# Patient Record
Sex: Female | Born: 1955 | Race: White | Hispanic: No | Smoking: Current every day smoker
Health system: Southern US, Community
[De-identification: ages and names within clinical notes are randomized; demographics above are authoritative.]

## PROBLEM LIST (undated history)

## (undated) DIAGNOSIS — F17200 Nicotine dependence, unspecified, uncomplicated: Secondary | ICD-10-CM

## (undated) DIAGNOSIS — M545 Low back pain, unspecified: Secondary | ICD-10-CM

## (undated) DIAGNOSIS — S72009A Fracture of unspecified part of neck of unspecified femur, initial encounter for closed fracture: Secondary | ICD-10-CM

## (undated) DIAGNOSIS — R259 Unspecified abnormal involuntary movements: Secondary | ICD-10-CM

## (undated) DIAGNOSIS — F329 Major depressive disorder, single episode, unspecified: Secondary | ICD-10-CM

## (undated) DIAGNOSIS — E860 Dehydration: Secondary | ICD-10-CM

## (undated) DIAGNOSIS — K635 Polyp of colon: Secondary | ICD-10-CM

## (undated) DIAGNOSIS — M25529 Pain in unspecified elbow: Secondary | ICD-10-CM

## (undated) DIAGNOSIS — N390 Urinary tract infection, site not specified: Secondary | ICD-10-CM

## (undated) DIAGNOSIS — R413 Other amnesia: Secondary | ICD-10-CM

## (undated) DIAGNOSIS — E039 Hypothyroidism, unspecified: Secondary | ICD-10-CM

## (undated) DIAGNOSIS — Z8542 Personal history of malignant neoplasm of other parts of uterus: Secondary | ICD-10-CM

## (undated) DIAGNOSIS — I313 Pericardial effusion (noninflammatory): Secondary | ICD-10-CM

## (undated) DIAGNOSIS — J449 Chronic obstructive pulmonary disease, unspecified: Secondary | ICD-10-CM

## (undated) DIAGNOSIS — M7060 Trochanteric bursitis, unspecified hip: Secondary | ICD-10-CM

## (undated) DIAGNOSIS — M5430 Sciatica, unspecified side: Secondary | ICD-10-CM

## (undated) DIAGNOSIS — R1084 Generalized abdominal pain: Secondary | ICD-10-CM

## (undated) DIAGNOSIS — E785 Hyperlipidemia, unspecified: Secondary | ICD-10-CM

## (undated) DIAGNOSIS — R197 Diarrhea, unspecified: Secondary | ICD-10-CM

## (undated) DIAGNOSIS — R112 Nausea with vomiting, unspecified: Secondary | ICD-10-CM

## (undated) DIAGNOSIS — E119 Type 2 diabetes mellitus without complications: Secondary | ICD-10-CM

## (undated) DIAGNOSIS — G9341 Metabolic encephalopathy: Secondary | ICD-10-CM

## (undated) DIAGNOSIS — B029 Zoster without complications: Secondary | ICD-10-CM

## (undated) DIAGNOSIS — I314 Cardiac tamponade: Secondary | ICD-10-CM

## (undated) DIAGNOSIS — M25559 Pain in unspecified hip: Secondary | ICD-10-CM

## (undated) DIAGNOSIS — F32A Depression, unspecified: Secondary | ICD-10-CM

## (undated) DIAGNOSIS — M7072 Other bursitis of hip, left hip: Secondary | ICD-10-CM

## (undated) DIAGNOSIS — E559 Vitamin D deficiency, unspecified: Secondary | ICD-10-CM

## (undated) DIAGNOSIS — M533 Sacrococcygeal disorders, not elsewhere classified: Secondary | ICD-10-CM

## (undated) DIAGNOSIS — G4730 Sleep apnea, unspecified: Secondary | ICD-10-CM

## (undated) DIAGNOSIS — K219 Gastro-esophageal reflux disease without esophagitis: Secondary | ICD-10-CM

## (undated) DIAGNOSIS — IMO0002 Reserved for concepts with insufficient information to code with codable children: Secondary | ICD-10-CM

## (undated) DIAGNOSIS — Z8541 Personal history of malignant neoplasm of cervix uteri: Secondary | ICD-10-CM

## (undated) DIAGNOSIS — K529 Noninfective gastroenteritis and colitis, unspecified: Secondary | ICD-10-CM

## (undated) DIAGNOSIS — M542 Cervicalgia: Secondary | ICD-10-CM

## (undated) DIAGNOSIS — S0300XA Dislocation of jaw, unspecified side, initial encounter: Secondary | ICD-10-CM

## (undated) DIAGNOSIS — G8929 Other chronic pain: Secondary | ICD-10-CM

## (undated) DIAGNOSIS — K2270 Barrett's esophagus without dysplasia: Secondary | ICD-10-CM

## (undated) HISTORY — DX: Zoster without complications: B02.9

## (undated) HISTORY — PX: HIP SURGERY: SHX245

## (undated) HISTORY — DX: Other chronic pain: G89.29

## (undated) HISTORY — DX: Personal history of malignant neoplasm of cervix uteri: Z85.41

## (undated) HISTORY — DX: Depression, unspecified: F32.A

## (undated) HISTORY — DX: Urinary tract infection, site not specified: N39.0

## (undated) HISTORY — DX: Major depressive disorder, single episode, unspecified: F32.9

## (undated) HISTORY — DX: Gastro-esophageal reflux disease without esophagitis: K21.9

## (undated) HISTORY — DX: Hyperlipidemia, unspecified: E78.5

## (undated) HISTORY — DX: Trochanteric bursitis, unspecified hip: M70.60

## (undated) HISTORY — DX: Fracture of unspecified part of neck of unspecified femur, initial encounter for closed fracture: S72.009A

## (undated) HISTORY — DX: Pain in unspecified elbow: M25.529

## (undated) HISTORY — PX: DIAGNOSTIC LAPAROSCOPY: SUR761

## (undated) HISTORY — DX: Nicotine dependence, unspecified, uncomplicated: F17.200

## (undated) HISTORY — DX: Low back pain, unspecified: M54.50

## (undated) HISTORY — PX: ABDOMINAL HYSTERECTOMY: SHX81

## (undated) HISTORY — DX: Metabolic encephalopathy: G93.41

## (undated) HISTORY — DX: Low back pain: M54.5

## (undated) HISTORY — DX: Sleep apnea, unspecified: G47.30

## (undated) HISTORY — DX: Dehydration: E86.0

## (undated) HISTORY — DX: Other bursitis of hip, left hip: M70.72

## (undated) HISTORY — DX: Chronic obstructive pulmonary disease, unspecified: J44.9

## (undated) HISTORY — DX: Sciatica, unspecified side: M54.30

## (undated) HISTORY — DX: Type 2 diabetes mellitus without complications: E11.9

## (undated) HISTORY — DX: Vitamin D deficiency, unspecified: E55.9

## (undated) HISTORY — DX: Hypothyroidism, unspecified: E03.9

## (undated) HISTORY — DX: Nausea with vomiting, unspecified: R11.2

## (undated) HISTORY — DX: Reserved for concepts with insufficient information to code with codable children: IMO0002

## (undated) HISTORY — DX: Cardiac tamponade: I31.4

## (undated) HISTORY — DX: Cervicalgia: M54.2

## (undated) HISTORY — DX: Pain in unspecified hip: M25.559

## (undated) HISTORY — DX: Unspecified abnormal involuntary movements: R25.9

## (undated) HISTORY — DX: Pericardial effusion (noninflammatory): I31.3

## (undated) HISTORY — DX: Diarrhea, unspecified: R19.7

## (undated) HISTORY — DX: Generalized abdominal pain: R10.84

## (undated) HISTORY — DX: Noninfective gastroenteritis and colitis, unspecified: K52.9

## (undated) HISTORY — DX: Personal history of malignant neoplasm of other parts of uterus: Z85.42

## (undated) HISTORY — DX: Sacrococcygeal disorders, not elsewhere classified: M53.3

## (undated) HISTORY — DX: Polyp of colon: K63.5

## (undated) HISTORY — DX: Barrett's esophagus without dysplasia: K22.70

---

## 1993-09-08 HISTORY — PX: BACK SURGERY: SHX140

## 1996-09-08 DIAGNOSIS — Z8542 Personal history of malignant neoplasm of other parts of uterus: Secondary | ICD-10-CM

## 1996-09-08 HISTORY — DX: Personal history of malignant neoplasm of other parts of uterus: Z85.42

## 1997-09-08 HISTORY — PX: OTHER SURGICAL HISTORY: SHX169

## 2004-09-08 HISTORY — PX: ROTATOR CUFF REPAIR: SHX139

## 2005-09-08 HISTORY — PX: THYROIDECTOMY: SHX17

## 2007-09-24 ENCOUNTER — Ambulatory Visit (HOSPITAL_COMMUNITY): Admission: RE | Admit: 2007-09-24 | Discharge: 2007-09-24 | Payer: Self-pay | Admitting: Orthopedic Surgery

## 2008-04-02 ENCOUNTER — Emergency Department (HOSPITAL_COMMUNITY): Admission: EM | Admit: 2008-04-02 | Discharge: 2008-04-02 | Payer: Self-pay | Admitting: Emergency Medicine

## 2008-04-04 ENCOUNTER — Ambulatory Visit (HOSPITAL_COMMUNITY): Admission: RE | Admit: 2008-04-04 | Discharge: 2008-04-04 | Payer: Self-pay | Admitting: Emergency Medicine

## 2008-04-06 ENCOUNTER — Inpatient Hospital Stay (HOSPITAL_COMMUNITY): Admission: AD | Admit: 2008-04-06 | Discharge: 2008-04-08 | Payer: Self-pay | Admitting: Orthopedic Surgery

## 2008-04-13 ENCOUNTER — Encounter
Admission: RE | Admit: 2008-04-13 | Discharge: 2008-05-12 | Payer: Self-pay | Admitting: Physical Medicine and Rehabilitation

## 2008-04-17 ENCOUNTER — Ambulatory Visit: Payer: Self-pay | Admitting: Physical Medicine and Rehabilitation

## 2008-04-19 ENCOUNTER — Ambulatory Visit (HOSPITAL_COMMUNITY): Admission: RE | Admit: 2008-04-19 | Discharge: 2008-04-19 | Payer: Self-pay | Admitting: Family Medicine

## 2008-05-12 ENCOUNTER — Ambulatory Visit: Payer: Self-pay | Admitting: Physical Medicine and Rehabilitation

## 2008-05-18 ENCOUNTER — Ambulatory Visit (HOSPITAL_COMMUNITY)
Admission: RE | Admit: 2008-05-18 | Discharge: 2008-05-18 | Payer: Self-pay | Admitting: Physical Medicine and Rehabilitation

## 2008-06-08 ENCOUNTER — Encounter
Admission: RE | Admit: 2008-06-08 | Discharge: 2008-09-06 | Payer: Self-pay | Admitting: Physical Medicine and Rehabilitation

## 2008-06-30 ENCOUNTER — Ambulatory Visit: Admission: RE | Admit: 2008-06-30 | Discharge: 2008-06-30 | Payer: Self-pay | Admitting: Family Medicine

## 2008-07-07 ENCOUNTER — Ambulatory Visit: Payer: Self-pay | Admitting: Physical Medicine and Rehabilitation

## 2008-07-14 ENCOUNTER — Ambulatory Visit (HOSPITAL_COMMUNITY)
Admission: RE | Admit: 2008-07-14 | Discharge: 2008-07-14 | Payer: Self-pay | Admitting: Physical Medicine and Rehabilitation

## 2008-09-08 HISTORY — PX: OTHER SURGICAL HISTORY: SHX169

## 2008-09-14 ENCOUNTER — Encounter
Admission: RE | Admit: 2008-09-14 | Discharge: 2008-12-13 | Payer: Self-pay | Admitting: Physical Medicine and Rehabilitation

## 2008-09-15 ENCOUNTER — Ambulatory Visit: Payer: Self-pay | Admitting: Physical Medicine and Rehabilitation

## 2008-09-29 ENCOUNTER — Encounter (HOSPITAL_COMMUNITY)
Admission: RE | Admit: 2008-09-29 | Discharge: 2008-10-29 | Payer: Self-pay | Admitting: Physical Medicine and Rehabilitation

## 2008-09-29 ENCOUNTER — Ambulatory Visit
Admission: RE | Admit: 2008-09-29 | Discharge: 2008-09-29 | Payer: Self-pay | Admitting: Physical Medicine and Rehabilitation

## 2008-11-01 ENCOUNTER — Ambulatory Visit: Payer: Self-pay | Admitting: Physical Medicine and Rehabilitation

## 2008-12-07 ENCOUNTER — Encounter
Admission: RE | Admit: 2008-12-07 | Discharge: 2008-12-07 | Payer: Self-pay | Admitting: Physical Medicine & Rehabilitation

## 2008-12-13 ENCOUNTER — Encounter
Admission: RE | Admit: 2008-12-13 | Discharge: 2009-03-13 | Payer: Self-pay | Admitting: Physical Medicine and Rehabilitation

## 2008-12-13 ENCOUNTER — Ambulatory Visit: Payer: Self-pay | Admitting: Physical Medicine and Rehabilitation

## 2009-01-11 ENCOUNTER — Ambulatory Visit: Payer: Self-pay | Admitting: Physical Medicine and Rehabilitation

## 2009-01-18 ENCOUNTER — Ambulatory Visit: Payer: Self-pay | Admitting: Physical Medicine and Rehabilitation

## 2009-01-29 ENCOUNTER — Ambulatory Visit (HOSPITAL_COMMUNITY)
Admission: RE | Admit: 2009-01-29 | Discharge: 2009-01-29 | Payer: Self-pay | Admitting: Physical Medicine and Rehabilitation

## 2009-01-31 ENCOUNTER — Ambulatory Visit: Payer: Self-pay | Admitting: Physical Medicine and Rehabilitation

## 2009-02-06 ENCOUNTER — Ambulatory Visit (HOSPITAL_COMMUNITY)
Admission: RE | Admit: 2009-02-06 | Discharge: 2009-02-06 | Payer: Self-pay | Admitting: Physical Medicine and Rehabilitation

## 2009-02-16 ENCOUNTER — Ambulatory Visit (HOSPITAL_COMMUNITY)
Admission: RE | Admit: 2009-02-16 | Discharge: 2009-02-16 | Payer: Self-pay | Admitting: Physical Medicine and Rehabilitation

## 2009-02-23 ENCOUNTER — Ambulatory Visit: Payer: Self-pay | Admitting: Physical Medicine and Rehabilitation

## 2009-03-20 ENCOUNTER — Encounter
Admission: RE | Admit: 2009-03-20 | Discharge: 2009-06-18 | Payer: Self-pay | Admitting: Physical Medicine and Rehabilitation

## 2009-03-21 ENCOUNTER — Ambulatory Visit: Payer: Self-pay | Admitting: Physical Medicine and Rehabilitation

## 2009-04-18 ENCOUNTER — Ambulatory Visit: Payer: Self-pay | Admitting: Physical Medicine and Rehabilitation

## 2009-05-18 ENCOUNTER — Ambulatory Visit: Payer: Self-pay | Admitting: Physical Medicine and Rehabilitation

## 2009-06-14 ENCOUNTER — Encounter: Admission: RE | Admit: 2009-06-14 | Discharge: 2009-06-14 | Payer: Self-pay | Admitting: Neurological Surgery

## 2009-06-19 ENCOUNTER — Encounter
Admission: RE | Admit: 2009-06-19 | Discharge: 2009-08-30 | Payer: Self-pay | Admitting: Physical Medicine and Rehabilitation

## 2009-06-20 ENCOUNTER — Ambulatory Visit: Payer: Self-pay | Admitting: Physical Medicine and Rehabilitation

## 2009-06-26 ENCOUNTER — Ambulatory Visit (HOSPITAL_COMMUNITY): Admission: RE | Admit: 2009-06-26 | Discharge: 2009-06-26 | Payer: Self-pay | Admitting: Family Medicine

## 2009-07-18 ENCOUNTER — Ambulatory Visit: Payer: Self-pay | Admitting: Physical Medicine and Rehabilitation

## 2009-08-14 ENCOUNTER — Ambulatory Visit (HOSPITAL_BASED_OUTPATIENT_CLINIC_OR_DEPARTMENT_OTHER): Admission: RE | Admit: 2009-08-14 | Discharge: 2009-08-14 | Payer: Self-pay | Admitting: Orthopedic Surgery

## 2009-08-15 ENCOUNTER — Ambulatory Visit: Payer: Self-pay | Admitting: Physical Medicine and Rehabilitation

## 2009-09-17 ENCOUNTER — Ambulatory Visit: Payer: Self-pay | Admitting: Physical Medicine and Rehabilitation

## 2009-09-17 ENCOUNTER — Encounter
Admission: RE | Admit: 2009-09-17 | Discharge: 2009-12-16 | Payer: Self-pay | Admitting: Physical Medicine and Rehabilitation

## 2009-10-15 ENCOUNTER — Ambulatory Visit: Payer: Self-pay | Admitting: Physical Medicine and Rehabilitation

## 2009-11-09 ENCOUNTER — Ambulatory Visit: Payer: Self-pay | Admitting: Physical Medicine and Rehabilitation

## 2009-11-27 ENCOUNTER — Ambulatory Visit: Payer: Self-pay | Admitting: Internal Medicine

## 2009-11-27 DIAGNOSIS — K5909 Other constipation: Secondary | ICD-10-CM | POA: Insufficient documentation

## 2009-11-27 DIAGNOSIS — K219 Gastro-esophageal reflux disease without esophagitis: Secondary | ICD-10-CM | POA: Insufficient documentation

## 2009-11-27 DIAGNOSIS — Z91013 Allergy to seafood: Secondary | ICD-10-CM | POA: Insufficient documentation

## 2009-11-27 DIAGNOSIS — Z91040 Latex allergy status: Secondary | ICD-10-CM | POA: Insufficient documentation

## 2009-11-27 DIAGNOSIS — K2270 Barrett's esophagus without dysplasia: Secondary | ICD-10-CM | POA: Insufficient documentation

## 2009-11-27 DIAGNOSIS — Z86010 Personal history of colonic polyps: Secondary | ICD-10-CM | POA: Insufficient documentation

## 2009-12-12 ENCOUNTER — Ambulatory Visit: Payer: Self-pay | Admitting: Physical Medicine and Rehabilitation

## 2009-12-18 ENCOUNTER — Ambulatory Visit (HOSPITAL_COMMUNITY): Admission: RE | Admit: 2009-12-18 | Discharge: 2009-12-18 | Payer: Self-pay | Admitting: Internal Medicine

## 2009-12-18 ENCOUNTER — Ambulatory Visit: Payer: Self-pay | Admitting: Internal Medicine

## 2009-12-18 ENCOUNTER — Telehealth (INDEPENDENT_AMBULATORY_CARE_PROVIDER_SITE_OTHER): Payer: Self-pay | Admitting: *Deleted

## 2009-12-20 ENCOUNTER — Encounter (HOSPITAL_COMMUNITY): Admission: RE | Admit: 2009-12-20 | Discharge: 2010-01-19 | Payer: Self-pay | Admitting: Internal Medicine

## 2010-01-14 ENCOUNTER — Encounter
Admission: RE | Admit: 2010-01-14 | Discharge: 2010-04-05 | Payer: Self-pay | Admitting: Physical Medicine and Rehabilitation

## 2010-01-16 ENCOUNTER — Ambulatory Visit: Payer: Self-pay | Admitting: Physical Medicine and Rehabilitation

## 2010-02-06 ENCOUNTER — Ambulatory Visit: Payer: Self-pay | Admitting: Internal Medicine

## 2010-02-06 DIAGNOSIS — R1084 Generalized abdominal pain: Secondary | ICD-10-CM

## 2010-02-06 HISTORY — DX: Generalized abdominal pain: R10.84

## 2010-02-08 ENCOUNTER — Ambulatory Visit: Payer: Self-pay | Admitting: Physical Medicine and Rehabilitation

## 2010-03-04 ENCOUNTER — Telehealth (INDEPENDENT_AMBULATORY_CARE_PROVIDER_SITE_OTHER): Payer: Self-pay

## 2010-03-14 ENCOUNTER — Ambulatory Visit: Payer: Self-pay | Admitting: Physical Medicine and Rehabilitation

## 2010-04-05 ENCOUNTER — Encounter
Admission: RE | Admit: 2010-04-05 | Discharge: 2010-04-18 | Payer: Self-pay | Admitting: Physical Medicine & Rehabilitation

## 2010-04-08 ENCOUNTER — Encounter: Payer: Self-pay | Admitting: Internal Medicine

## 2010-04-11 ENCOUNTER — Encounter: Payer: Self-pay | Admitting: Internal Medicine

## 2010-04-12 ENCOUNTER — Ambulatory Visit: Payer: Self-pay | Admitting: Physical Medicine and Rehabilitation

## 2010-04-12 ENCOUNTER — Ambulatory Visit (HOSPITAL_COMMUNITY)
Admission: RE | Admit: 2010-04-12 | Discharge: 2010-04-12 | Payer: Self-pay | Admitting: Physical Medicine and Rehabilitation

## 2010-04-12 ENCOUNTER — Encounter
Admission: RE | Admit: 2010-04-12 | Discharge: 2010-07-08 | Payer: Self-pay | Admitting: Physical Medicine and Rehabilitation

## 2010-05-02 ENCOUNTER — Ambulatory Visit: Payer: Self-pay | Admitting: Internal Medicine

## 2010-05-02 ENCOUNTER — Ambulatory Visit (HOSPITAL_COMMUNITY): Admission: RE | Admit: 2010-05-02 | Discharge: 2010-05-02 | Payer: Self-pay | Admitting: Internal Medicine

## 2010-05-02 HISTORY — PX: COLONOSCOPY: SHX174

## 2010-05-02 HISTORY — PX: ESOPHAGOGASTRODUODENOSCOPY: SHX1529

## 2010-05-07 ENCOUNTER — Encounter: Payer: Self-pay | Admitting: Internal Medicine

## 2010-05-09 LAB — HM COLONOSCOPY

## 2010-05-15 ENCOUNTER — Ambulatory Visit: Payer: Self-pay | Admitting: Physical Medicine and Rehabilitation

## 2010-05-27 ENCOUNTER — Encounter
Admission: RE | Admit: 2010-05-27 | Discharge: 2010-05-27 | Payer: Self-pay | Admitting: Physical Medicine and Rehabilitation

## 2010-06-08 LAB — HM MAMMOGRAPHY

## 2010-06-12 ENCOUNTER — Ambulatory Visit: Payer: Self-pay | Admitting: Physical Medicine and Rehabilitation

## 2010-07-02 ENCOUNTER — Ambulatory Visit (HOSPITAL_COMMUNITY): Admission: RE | Admit: 2010-07-02 | Discharge: 2010-07-02 | Payer: Self-pay | Admitting: Family Medicine

## 2010-07-08 ENCOUNTER — Encounter
Admission: RE | Admit: 2010-07-08 | Discharge: 2010-09-04 | Payer: Self-pay | Source: Home / Self Care | Attending: Physical Medicine and Rehabilitation | Admitting: Physical Medicine and Rehabilitation

## 2010-08-07 ENCOUNTER — Encounter
Admission: RE | Admit: 2010-08-07 | Discharge: 2010-09-03 | Payer: Self-pay | Source: Home / Self Care | Attending: Orthopedic Surgery | Admitting: Orthopedic Surgery

## 2010-08-09 ENCOUNTER — Ambulatory Visit: Payer: Self-pay | Admitting: Physical Medicine and Rehabilitation

## 2010-08-09 ENCOUNTER — Ambulatory Visit (HOSPITAL_COMMUNITY)
Admission: RE | Admit: 2010-08-09 | Discharge: 2010-08-09 | Payer: Self-pay | Source: Home / Self Care | Admitting: Physical Medicine and Rehabilitation

## 2010-09-09 ENCOUNTER — Encounter
Admission: RE | Admit: 2010-09-09 | Discharge: 2010-09-10 | Payer: Self-pay | Source: Home / Self Care | Attending: Physical Medicine and Rehabilitation | Admitting: Physical Medicine and Rehabilitation

## 2010-09-10 ENCOUNTER — Ambulatory Visit
Admission: RE | Admit: 2010-09-10 | Discharge: 2010-09-10 | Payer: Self-pay | Source: Home / Self Care | Attending: Physical Medicine and Rehabilitation | Admitting: Physical Medicine and Rehabilitation

## 2010-09-29 ENCOUNTER — Encounter: Payer: Self-pay | Admitting: Orthopedic Surgery

## 2010-10-08 NOTE — Letter (Signed)
Summary: UPDATED TRIAGE PRE-OP ORDER  UPDATED TRIAGE PRE-OP ORDER   Imported By: Rexene Alberts 04/08/2010 15:42:49  _____________________________________________________________________  External Attachment:    Type:   Image     Comment:   External Document  Appended Document: UPDATED TRIAGE PRE-OP ORDER ok as is

## 2010-10-08 NOTE — Progress Notes (Signed)
Summary: pt cancels appt for EGD/TCS/ will reschedule later  Phone Note Call from Patient   Caller: Patient Summary of Call: Pt called to cancel her appt that she had 03/07/2010 with Dr. Jena Gauss to have EGD/TCS in OR. She has guests for the week.  She wants to postpone until later July or first of August. She will call back later to schedule. ( She was scheduled for ETC @ 7:30 on 03/07/2010 in OR for abd pain and h/o colon polyps.  ) LMOM for Kim that appt is cancelled.  Initial call taken by: Cloria Spring LPN,  March 04, 2010 8:42 AM     Appended Document: pt cancels appt for EGD/TCS/ will reschedule later lets call pt end of August if we have not heard from her by then  Appended Document: pt cancels appt for EGD/TCS/ will reschedule later Pt has been triaged and scheduled for 05/02/2010 @ 9:30 in the OR. Pre-OP appt is 04/30/2010 @ 11:00 AM. Pt aware and Kim aware.  (Will scan updated triage to EMR)

## 2010-10-08 NOTE — Letter (Signed)
Summary: GES ORDER  GES ORDER   Imported By: Ave Filter 12/18/2009 09:47:03  _____________________________________________________________________  External Attachment:    Type:   Image     Comment:   External Document

## 2010-10-08 NOTE — Letter (Signed)
Summary: RELEASE OF MEDICAL INFORMATION  RELEASE OF MEDICAL INFORMATION   Imported By: Rexene Alberts 04/11/2010 16:47:24  _____________________________________________________________________  External Attachment:    Type:   Image     Comment:   External Document

## 2010-10-08 NOTE — Letter (Signed)
Summary: Patient Notice, Colon Biopsy Results  [facility]  [address]   [place], [place] [address]   Phone: [phone]  Fax: [phone]       May 07, 2010   ANDRES ESCANDON [address] [place], [place]  [address] 10/06/1955    Dear Ms. Ignasiak,  I am pleased to inform you that the biopsies taken during your recent colonoscopy did not show any evidence of cancer upon pathologic examination.  There was mild inflammation in your esophagus.  Additional information/recommendations:  Continue with the treatment plan as outlined on the day of your exam.  You should have a repeat colonoscopy examination  in 5 years.  Please call us if you are having persistent problems or have questions about your condition that have not been fully answered at this time.  Sincerely,    R. Roetta Sessions MD, FACP [facility] Ph: [phone]    Fax: [phone]   Appended Document: Patient Notice, Colon Biopsy Results letter mailed to pt

## 2010-10-08 NOTE — Letter (Signed)
Summary: TCS/EGD ORDER  TCS/EGD ORDER   Imported By: Ave Filter 11/27/2009 10:00:52  _____________________________________________________________________  External Attachment:    Type:   Image     Comment:   External Document

## 2010-10-08 NOTE — Letter (Signed)
Summary: REFERRAL DR. PICKARD  REFERRAL DR. PICKARD   Imported By: Diana Eves 11/27/2009 15:18:53  _____________________________________________________________________  External Attachment:    Type:   Image     Comment:   External Document
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
## 2010-10-08 NOTE — Letter (Signed)
Summary: EGD/TCS IN THE OR ORDER  EGD/TCS IN THE OR ORDER   Imported By: Ave Filter 02/06/2010 10:26:47  _____________________________________________________________________  External Attachment:    Type:   Image     Comment:   External Document

## 2010-10-08 NOTE — Progress Notes (Signed)
Summary: GES/TCS/EGD ORDER  ---- Converted from flag ---- ---- 12/18/2009 8:53 AM, Jonathon Bellows MD, Caleen Essex wrote: poor,poor prep, pt combartive needs GES and then repeat EGD and TCS in OR in 1 month - needs extra prep and extra day cl liquids//could not be aequately sedated ------------------------------

## 2010-10-08 NOTE — Letter (Signed)
Summary: Internal Other Domingo Dimes  Internal Other Domingo Dimes   Imported By: Cloria Spring LPN 16/06/9603 54:09:81  _____________________________________________________________________  External Attachment:    Type:   Image     Comment:   External Document
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
## 2010-10-09 ENCOUNTER — Ambulatory Visit: Admit: 2010-10-09 | Payer: Self-pay | Admitting: Physical Medicine and Rehabilitation

## 2010-10-09 ENCOUNTER — Ambulatory Visit: Payer: Medicaid Other | Attending: Physical Medicine and Rehabilitation

## 2010-10-09 ENCOUNTER — Ambulatory Visit (HOSPITAL_BASED_OUTPATIENT_CLINIC_OR_DEPARTMENT_OTHER): Payer: Medicaid Other | Admitting: Physical Medicine and Rehabilitation

## 2010-10-09 DIAGNOSIS — M545 Low back pain, unspecified: Secondary | ICD-10-CM

## 2010-10-09 DIAGNOSIS — M542 Cervicalgia: Secondary | ICD-10-CM

## 2010-10-09 DIAGNOSIS — M79609 Pain in unspecified limb: Secondary | ICD-10-CM | POA: Insufficient documentation

## 2010-10-09 DIAGNOSIS — M76899 Other specified enthesopathies of unspecified lower limb, excluding foot: Secondary | ICD-10-CM | POA: Insufficient documentation

## 2010-10-09 DIAGNOSIS — M25559 Pain in unspecified hip: Secondary | ICD-10-CM | POA: Insufficient documentation

## 2010-10-09 DIAGNOSIS — M19019 Primary osteoarthritis, unspecified shoulder: Secondary | ICD-10-CM | POA: Insufficient documentation

## 2010-10-09 DIAGNOSIS — IMO0001 Reserved for inherently not codable concepts without codable children: Secondary | ICD-10-CM | POA: Insufficient documentation

## 2010-10-09 DIAGNOSIS — M25529 Pain in unspecified elbow: Secondary | ICD-10-CM

## 2010-11-18 ENCOUNTER — Ambulatory Visit (HOSPITAL_BASED_OUTPATIENT_CLINIC_OR_DEPARTMENT_OTHER): Payer: Medicaid Other | Admitting: Physical Medicine and Rehabilitation

## 2010-11-18 ENCOUNTER — Encounter: Payer: Medicaid Other | Attending: Physical Medicine and Rehabilitation

## 2010-11-18 DIAGNOSIS — Z79899 Other long term (current) drug therapy: Secondary | ICD-10-CM | POA: Insufficient documentation

## 2010-11-18 DIAGNOSIS — G8929 Other chronic pain: Secondary | ICD-10-CM | POA: Insufficient documentation

## 2010-11-18 DIAGNOSIS — Z993 Dependence on wheelchair: Secondary | ICD-10-CM | POA: Insufficient documentation

## 2010-11-18 DIAGNOSIS — M545 Low back pain, unspecified: Secondary | ICD-10-CM

## 2010-11-18 DIAGNOSIS — IMO0001 Reserved for inherently not codable concepts without codable children: Secondary | ICD-10-CM | POA: Insufficient documentation

## 2010-11-18 DIAGNOSIS — M79609 Pain in unspecified limb: Secondary | ICD-10-CM | POA: Insufficient documentation

## 2010-11-18 DIAGNOSIS — M25859 Other specified joint disorders, unspecified hip: Secondary | ICD-10-CM | POA: Insufficient documentation

## 2010-11-18 DIAGNOSIS — G894 Chronic pain syndrome: Secondary | ICD-10-CM

## 2010-11-18 DIAGNOSIS — M25559 Pain in unspecified hip: Secondary | ICD-10-CM | POA: Insufficient documentation

## 2010-11-18 DIAGNOSIS — E669 Obesity, unspecified: Secondary | ICD-10-CM | POA: Insufficient documentation

## 2010-11-22 LAB — BASIC METABOLIC PANEL
BUN: 15 mg/dL (ref 6–23)
CO2: 30 mEq/L (ref 19–32)
Calcium: 8.8 mg/dL (ref 8.4–10.5)
Creatinine, Ser: 0.85 mg/dL (ref 0.4–1.2)
GFR calc Af Amer: >60 mL/min (ref >60)
Glucose, Bld: 163 mg/dL — ABNORMAL HIGH (ref 70–99)

## 2010-11-28 ENCOUNTER — Emergency Department (HOSPITAL_COMMUNITY): Payer: Medicaid Other

## 2010-11-28 ENCOUNTER — Emergency Department (HOSPITAL_COMMUNITY)
Admission: EM | Admit: 2010-11-28 | Discharge: 2010-11-28 | Disposition: A | Discharge status: Home / Self Care | Payer: Medicaid Other | Attending: Emergency Medicine | Admitting: Emergency Medicine

## 2010-11-28 DIAGNOSIS — Z9889 Other specified postprocedural states: Secondary | ICD-10-CM | POA: Insufficient documentation

## 2010-11-28 DIAGNOSIS — M549 Dorsalgia, unspecified: Secondary | ICD-10-CM | POA: Insufficient documentation

## 2010-11-28 DIAGNOSIS — Y92009 Unspecified place in unspecified non-institutional (private) residence as the place of occurrence of the external cause: Secondary | ICD-10-CM | POA: Insufficient documentation

## 2010-11-28 DIAGNOSIS — T1490XA Injury, unspecified, initial encounter: Secondary | ICD-10-CM | POA: Insufficient documentation

## 2010-11-28 DIAGNOSIS — G8929 Other chronic pain: Secondary | ICD-10-CM | POA: Insufficient documentation

## 2010-11-28 DIAGNOSIS — M545 Low back pain, unspecified: Secondary | ICD-10-CM | POA: Insufficient documentation

## 2010-11-28 DIAGNOSIS — W07XXXA Fall from chair, initial encounter: Secondary | ICD-10-CM | POA: Insufficient documentation

## 2010-12-10 LAB — POCT I-STAT, CHEM 8
BUN: 18 mg/dL (ref 6–23)
Calcium, Ion: 1.17 mmol/L (ref 1.12–1.32)
Creatinine, Ser: 0.9 mg/dL (ref 0.4–1.2)
TCO2: 32 mmol/L (ref 0–100)

## 2010-12-17 ENCOUNTER — Encounter: Payer: Medicaid Other | Attending: Neurosurgery | Admitting: Neurosurgery

## 2010-12-17 DIAGNOSIS — M545 Low back pain, unspecified: Secondary | ICD-10-CM | POA: Insufficient documentation

## 2010-12-17 DIAGNOSIS — M25559 Pain in unspecified hip: Secondary | ICD-10-CM

## 2010-12-17 DIAGNOSIS — IMO0001 Reserved for inherently not codable concepts without codable children: Secondary | ICD-10-CM | POA: Insufficient documentation

## 2010-12-17 DIAGNOSIS — Q6589 Other specified congenital deformities of hip: Secondary | ICD-10-CM | POA: Insufficient documentation

## 2010-12-17 DIAGNOSIS — M5430 Sciatica, unspecified side: Secondary | ICD-10-CM

## 2010-12-17 DIAGNOSIS — M76899 Other specified enthesopathies of unspecified lower limb, excluding foot: Secondary | ICD-10-CM

## 2010-12-25 ENCOUNTER — Encounter (HOSPITAL_COMMUNITY): Payer: Medicaid Other

## 2010-12-25 ENCOUNTER — Other Ambulatory Visit: Payer: Self-pay | Admitting: Orthopedic Surgery

## 2010-12-25 LAB — CBC
HCT: 44.4 % (ref 36.0–46.0)
MCH: 28.5 pg (ref 26.0–34.0)
MCV: 89.7 fL (ref 78.0–100.0)
Platelets: 270 10*3/uL (ref 150–400)
RBC: 4.95 MIL/uL (ref 3.87–5.11)

## 2010-12-25 LAB — URINALYSIS, ROUTINE W REFLEX MICROSCOPIC
Glucose, UA: NEGATIVE mg/dL
Hgb urine dipstick: NEGATIVE
Ketones, ur: NEGATIVE mg/dL
Protein, ur: NEGATIVE mg/dL
pH: 7 (ref 5.0–8.0)

## 2010-12-25 LAB — SURGICAL PCR SCREEN
MRSA, PCR: NEGATIVE
Staphylococcus aureus: NEGATIVE

## 2010-12-25 LAB — DIFFERENTIAL
Eosinophils Absolute: 0.2 10*3/uL (ref 0.0–0.7)
Eosinophils Relative: 1 % (ref 0–5)
Lymphocytes Relative: 27 % (ref 12–46)
Lymphs Abs: 3.2 10*3/uL (ref 0.7–4.0)
Monocytes Relative: 8 % (ref 3–12)
Neutrophils Relative %: 63 % (ref 43–77)

## 2010-12-25 LAB — BASIC METABOLIC PANEL
CO2: 28 mEq/L (ref 19–32)
Chloride: 99 mEq/L (ref 96–112)
Creatinine, Ser: 0.77 mg/dL (ref 0.4–1.2)
GFR calc Af Amer: >60 mL/min (ref >60)
Potassium: 4.4 mEq/L (ref 3.5–5.1)

## 2010-12-25 LAB — APTT: aPTT: 32 seconds (ref 24–37)

## 2010-12-27 NOTE — H&P (Signed)
Lisa Ewing, Lisa Ewing                ACCOUNT NO.:  [id number]  MEDICAL RECORD NO.:  [id number]          PATIENT TYPE:  LOCATION:                                 FACILITY:  PHYSICIAN:  Madlyn Frankel. Charlann Boxer, M.D.  DATE OF BIRTH:  03-07-1956  DATE OF ADMISSION: DATE OF DISCHARGE:                             HISTORY & PHYSICAL   ADMITTING DIAGNOSIS:  Right hip osteoarthritis.  HISTORY OF PRESENT ILLNESS:  This is a 55 year old lady with a history of osteoarthritis of her right hip as well as long-term back problems. Due to her continued difficulty with her hip and its effect on her back, she is now scheduled for total hip arthroplasty by anterior approach of her right hip.  The surgery risks, benefits, and aftercare were discussed with the patient.  Questions invited and answered.  Note that the patient is not a candidate for Tranexamic acid and should not receive it in the preop holding area.  Note that the patient is given her home prescriptions including aspirin 325 mg to take twice a day postoperatively for her DVT prophylaxis.  PAST MEDICAL HISTORY:  Drug allergy to PENICILLIN with an anaphylactic type reaction, CODEINE with nausea and vomiting, SULFA with tongue swelling, PERCOCET with a rash, MOTRIN with nausea and vomiting, LYRICA with swelling.  Food allergies include SHELLFISH, NUTS, and SALMON.  She is LATEX allergic and has a NICKEL allergy.  CURRENT MEDICATIONS:  Boniva, hydrocodone, Astelin, Evista, Trilipix, levothyroxine, furosemide, calcitriol, Lexapro, cyclobenzaprine, meloxicam, vitamin D, gabapentin, Nexium, Advair, Niaspan, Crestor, trazodone, Singulair, acyclovir, cetirizine, and ipratropium 0.5 mg and albuterol 3.0 mg to use in a nebulizer daily.  She will bring the dosages of her medications with her to the hospital.  Medical illnesses include osteoporosis, asthma, hypertension, hypothyroidism, chronic back pain, hyperlipidemia, reflux.  Previous surgeries  include removal of thyroid for benign tumors, right shoulder rotator cuff repair, hysterectomy for uterine cancer, back surgery, and laparoscopy.  FAMILY HISTORY:  Positive for heart disease and cancers.  SOCIAL HISTORY:  The patient is divorced.  She smokes one pack per day and does not drink.  REVIEW OF SYSTEMS:  CENTRAL NERVOUS SYSTEM:  Positive for anxiety, shingles, and depression.  PULMONARY:  Positive for COPD with emphysema and asthma.  The patient also has sleep apnea, but does not use a CPAP machine at home.  CARDIOVASCULAR:  Negative for chest pain or palpitation.  GI:  Positive for history of hiatal hernia and reflux disease and irritable bowel syndrome.  GU:  Positive for urinary incontinence.  MUSCULOSKELETAL:  Positives in HPI.  PHYSICAL EXAMINATION:  GENERAL:  A well-developed and well-nourished lady in no acute distress. VITAL SIGNS:  Blood pressure 140/88, respirations 18, pulse 74 and regular. GENERAL APPEARANCE:  This is a well-developed and well-nourished lady in a scooter chair secondary to hip pain and back pain. HEENT:  Head normocephalic.  Nose patent.  Ears patent.  Pupils equal, round, and reactive to light.  Throat without injection. NECK:  Supple without adenopathy.  Carotids are 2+ without bruit. CHEST:  Clear to auscultation.  No rales or rhonchi.  Respirations 18. HEART:  Regular rate and rhythm at 74 beats per minute without murmur. ABDOMEN:  Soft with active bowel sounds.  No masses or organomegaly. NEUROLOGIC:  The patient is alert and oriented to time, place, and person.  Cranial nerves II through XII grossly intact. EXTREMITIES:  The right hip with pain and decreased range of motion. Neurovascular status is intact.  IMPRESSION:  Right hip osteoarthritis.  PLAN:  Total hip arthroplasty by anterior approach right hip.     Jaquelyn Bitter. Chabon, P.A.   ______________________________ Madlyn Frankel Charlann Boxer, M.D.    SJC/MEDQ  D:  12/18/2010  T:   12/19/2010  Job:  [id number]  Electronically Signed by Jodene Nam P.A. on 12/25/2010 07:35:51 AM Electronically Signed by Durene Romans M.D. on 12/27/2010 03:45:12 PM

## 2010-12-31 ENCOUNTER — Inpatient Hospital Stay (HOSPITAL_COMMUNITY): Payer: Medicaid Other

## 2010-12-31 ENCOUNTER — Inpatient Hospital Stay (HOSPITAL_COMMUNITY)
Admission: RE | Admit: 2010-12-31 | Discharge: 2011-01-06 | DRG: 469 | Disposition: A | Discharge status: Home Health | Payer: Medicaid Other | Source: Ambulatory Visit | Attending: Orthopedic Surgery | Admitting: Orthopedic Surgery

## 2010-12-31 DIAGNOSIS — Y831 Surgical operation with implant of artificial internal device as the cause of abnormal reaction of the patient, or of later complication, without mention of misadventure at the time of the procedure: Secondary | ICD-10-CM | POA: Diagnosis not present

## 2010-12-31 DIAGNOSIS — J954 Chemical pneumonitis due to anesthesia: Secondary | ICD-10-CM | POA: Diagnosis not present

## 2010-12-31 DIAGNOSIS — I10 Essential (primary) hypertension: Secondary | ICD-10-CM | POA: Diagnosis present

## 2010-12-31 DIAGNOSIS — E785 Hyperlipidemia, unspecified: Secondary | ICD-10-CM | POA: Diagnosis present

## 2010-12-31 DIAGNOSIS — J95821 Acute postprocedural respiratory failure: Secondary | ICD-10-CM | POA: Diagnosis not present

## 2010-12-31 DIAGNOSIS — E039 Hypothyroidism, unspecified: Secondary | ICD-10-CM | POA: Diagnosis present

## 2010-12-31 DIAGNOSIS — M1610 Unilateral primary osteoarthritis, unspecified hip: Principal | ICD-10-CM | POA: Diagnosis present

## 2010-12-31 DIAGNOSIS — J4489 Other specified chronic obstructive pulmonary disease: Secondary | ICD-10-CM | POA: Diagnosis present

## 2010-12-31 DIAGNOSIS — J810 Acute pulmonary edema: Secondary | ICD-10-CM

## 2010-12-31 DIAGNOSIS — R1310 Dysphagia, unspecified: Secondary | ICD-10-CM | POA: Diagnosis not present

## 2010-12-31 DIAGNOSIS — J9600 Acute respiratory failure, unspecified whether with hypoxia or hypercapnia: Secondary | ICD-10-CM

## 2010-12-31 DIAGNOSIS — M169 Osteoarthritis of hip, unspecified: Principal | ICD-10-CM | POA: Diagnosis present

## 2010-12-31 DIAGNOSIS — F17200 Nicotine dependence, unspecified, uncomplicated: Secondary | ICD-10-CM | POA: Diagnosis present

## 2010-12-31 DIAGNOSIS — J449 Chronic obstructive pulmonary disease, unspecified: Secondary | ICD-10-CM | POA: Diagnosis present

## 2010-12-31 DIAGNOSIS — Z01818 Encounter for other preprocedural examination: Secondary | ICD-10-CM

## 2010-12-31 DIAGNOSIS — E119 Type 2 diabetes mellitus without complications: Secondary | ICD-10-CM | POA: Diagnosis not present

## 2010-12-31 DIAGNOSIS — K219 Gastro-esophageal reflux disease without esophagitis: Secondary | ICD-10-CM | POA: Diagnosis present

## 2010-12-31 DIAGNOSIS — G4733 Obstructive sleep apnea (adult) (pediatric): Secondary | ICD-10-CM | POA: Diagnosis present

## 2010-12-31 DIAGNOSIS — M810 Age-related osteoporosis without current pathological fracture: Secondary | ICD-10-CM | POA: Diagnosis present

## 2010-12-31 LAB — BLOOD GAS, ARTERIAL
Acid-Base Excess: 0.3 mmol/L (ref 0.0–2.0)
Acid-Base Excess: 0.8 mmol/L (ref 0.0–2.0)
Bicarbonate: 29.7 mEq/L — ABNORMAL HIGH (ref 20.0–24.0)
Drawn by: 340271
FIO2: 1 %
MECHVT: 500 mL
O2 Saturation: 92.7 %
PEEP: 5 cmH2O
Patient temperature: 98.6
RATE: 16 resp/min
TCO2: 28.7 mmol/L (ref 0–100)
pCO2 arterial: 68 mmHg (ref 35.0–45.0)
pH, Arterial: 7.196 — CL (ref 7.350–7.400)
pH, Arterial: 7.248 — ABNORMAL LOW (ref 7.350–7.400)
pO2, Arterial: 154 mmHg — ABNORMAL HIGH (ref 80.0–100.0)

## 2010-12-31 LAB — TYPE AND SCREEN
ABO/RH(D): O NEG
Antibody Screen: NEGATIVE

## 2010-12-31 LAB — ABO/RH: ABO/RH(D): O NEG

## 2010-12-31 NOTE — Op Note (Signed)
Lisa Ewing, Lisa Ewing                ACCOUNT NO.:  [id number]  MEDICAL RECORD NO.:  [id number]           PATIENT TYPE:  I  LOCATION:  0006                         FACILITY:  [facility]  PHYSICIAN:  Madlyn Frankel. Charlann Boxer, M.D.  DATE OF BIRTH:  06-Sep-1956  DATE OF PROCEDURE:  12/31/2010 DATE OF DISCHARGE:                              OPERATIVE REPORT   PREOPERATIVE DIAGNOSIS:  Right hip osteoarthritis.  POSTOPERATIVE DIAGNOSIS:  Right hip osteoarthritis.  PROCEDURE:  Right total-hip replacement utilizing DePuy components size 50 pinnacle cup, single cancellous screw, 32 +4 neutral Altrex liner, size 3 standard Trilock stem with a 32 +1 Delta ceramic ball.  SURGEON:  Madlyn Frankel. Charlann Boxer, M.D.  ASSISTANT:  Leilani Able, PA-C  ANESTHESIA:  General.  SPECIMENS:  None.  ESTIMATED BLOOD LOSS:  Approximately 900 cc.  COMPLICATIONS:  It was noted during the operation with the utilization of fluoroscopy that during my broaching in preparation of the proximal femur, that a single broach had exited the posterior cortex. Adjustments were made accordingly.  I did have a bit of the calcar crack medially for which I put a 16-gauge wire, but this was independent and noncontiguous with the posterior perforation.  As will be noted in the body of the note, postprocedure with components in place there was no evidence of instability of the proximal femur.  DRAINS:  One Hemovac.  INDICATIONS FOR PROCEDURE:  Ms. Newsom is a 55 year old female who presented to the office for evaluation of right hip pain.  Radiographs revealed end-stage degenerative changes associated with old proximal femoral neck and head deformity with either an etiology that was unknown whether or not it was an old Perthes or not is unknown.  After reviewing with her the risks and benefits of the procedure including the anterior versus posterior approach, she wished to proceed through an anterior approach for a right total-hip replacement.   Consent was obtained for the benefit of pain relief.  PROCEDURE IN DETAIL:  The patient was brought to the operative theater. Once adequate anesthesia, preoperative antibiotics, Ancef 2 grams were administered, she was positioned supine on the [employer] table.  Once bony prominences were padded and she was adequately positioned, I had fluoroscopy brought into the field to evaluate for pelvic and bony landmarks.  Once this was done, we prepped and draped the right hip in the sterile fashion using the shower curtain technique.  A time-out was performed identifying the patient, planned procedure and extremity.  At this point, an incision was made about a centimeter or two distal and lateral to the anterior-superior iliac spine.  I extended this over the anterior aspect of the trochanter.  Sharp dissection was carried down to the tensor fascia.  The fascia of the tensor fascia lata muscle was then incised in line with its muscle.  The muscle was then elevated off the underneath the surface.  Retractors were placed superior.  At this point, I cauterized circumflex vessels and removed pericapsular fat and placed an inferior retractor.  An L capsulotomy was initially made at this point along the superior neck extending the limb laterally, proximally and  distally to the intertrochanteric line.  Tag sutures were placed and the retractor was placed intracapsular at this point using fluoroscopy identifying the location of the neck cut.  Neck osteotomy was made with traction applied to the femur.  The femoral head was removed without difficulty or complication.  Traction was taken off.  At this point, a retractor was placed anteriorly and one posteriorly.  After removing labral tissue and remaining foveal debris, I began reaming with a 43 reamer.  I chose to ream up to a 49 reamer with the last two reamers done under fluoroscopy to identify correct orientation.  A size 50 pinnacle cup was chosen  and with a curved impactor was impacted under fluoroscopic guidance in an effort to have proper orientation.  It was well seated.  A single cancellous screw was placed and then the hole eliminator and the final 32 +4 neutral Altrex liner was placed.  At this point, attention was now directed to the femur.  Initially I rotated the femur to about 80 degrees, focusing on the inferior capsule off the medial neck.  The hip was then rolled back to neutral on the trochanteric hook applied and the femur elevated with my pressure and the hook held in place on the table.  At this point, the leg was extended and adducted and the use of retractors released the posterior structures to allow for visualization.  With retractors placed, we began the process of broaching.  She had a large abdominal pannus that we had in the preoperative position taped out of the way to the contralateral side, but still needed to focus on getting this medially.  With my initial broach, this was when we noted the perforation into the posterior femur, indicating that obviously I had not adducted the broach inserted against her abdomen enough.  After this initial broach was placed, we did obtain radiographs to confirm the concern that was noted.  Following this, I went ahead and broached up at this time, focusing more on medializing the broach handle, thus lateralizing or putting the femoral stem more anterior in the canal.  At each step we confirmed the position of the broach.  I broached up to a size 3 broach.  During this process, I did notice that there was a medial calcar crack, usually not too much of a concern or significance for me, but given this posterior perforation I decided and went ahead to place a 16-gauge wire.  Using my bone hook, I passed the 16-gauge wire and then tensioned it down using a large needle driver.  Trial reduction was initially carried out with a 2 broach in place, a 2 standard neck  and a 32 +1 ball.  I was happy with the overall leg lengths.  We chose the 2 standard stem initially.  However, when I passed this stem based on the insert as it was available, obviously I was not able to medialize my hand enough in the stem under x-ray guidance before we did any final positioning was noted to be out again posteriorly.  Given this, I went ahead and removed this 2 standard stem.  I broached up to a size 3 at this point and again confirming this radiographically. I then chose a 3 standard stem and the 3 standard stem was then passed with more pressure applied to her abdomen to get it out of the way, allowing the stem to remain more anterior in the femur.  I did confirm the position prior to  final seating of the component.  Once we had confirmed the position of the stem in the AP and orthogonal views, it was impacted to its level.  The 32 +1 ball was chosen and the Delta ceramic ball impacted onto a clean and dry trunnion.  The hip was reduced.  We had irrigated the hip throughout the case and again at this point.  I confirmed that the femur moved as a single unit at this point. No evidence of any discontinuity despite the perforation of the posterior cortex.  I could digitally palpate this area and felt that the stem was no longer outside the femur.  There was a single fragment of bone that was noted. I did not do anything with this.  I felt that it had adequate muscle and was appropriately located that it would eventually heal.  Following this, final radiographs were obtained in the AP and lateral planes to confirm the position of the final stem based on what we had went through.  I irrigated the hip again.  At this point, I laid the anterior capsule over the top of the anterior aspect of the hip joint at this point, and I reapproximated the fascia of the tensor fascia using #1 Vicryl in a running fashion.  The remainder of the wound was closed with 2-0 Vicryl and  running 4-0 Monocryl.  The hip was cleaned, dried, and dressed sterilely with Dermabond and an Aquacel dressing and the drain site was dressed separately.  She was then brought to the recovery room in stable condition, tolerating the procedure well up to this point.     Madlyn Frankel Charlann Boxer, M.D.     MDO/MEDQ  D:  12/31/2010  T:  12/31/2010  Job:  [id number]  Electronically Signed by Durene Romans M.D. on 12/31/2010 04:49:23 PM

## 2011-01-01 ENCOUNTER — Inpatient Hospital Stay (HOSPITAL_COMMUNITY): Payer: Medicaid Other

## 2011-01-01 DIAGNOSIS — J189 Pneumonia, unspecified organism: Secondary | ICD-10-CM

## 2011-01-01 LAB — BLOOD GAS, ARTERIAL
Acid-Base Excess: 1.3 mmol/L (ref 0.0–2.0)
Bicarbonate: 27.2 mEq/L — ABNORMAL HIGH (ref 20.0–24.0)
Drawn by: 340271
Drawn by: 295031
FIO2: 0.4 %
PEEP: 5 cmH2O
PEEP: 5 cmH2O
Patient temperature: 98.6
RATE: 16 resp/min
pCO2 arterial: 53.5 mmHg — ABNORMAL HIGH (ref 35.0–45.0)
pCO2 arterial: 66 mmHg (ref 35.0–45.0)
pH, Arterial: 7.284 — ABNORMAL LOW (ref 7.350–7.400)
pO2, Arterial: 62.9 mmHg — ABNORMAL LOW (ref 80.0–100.0)

## 2011-01-01 LAB — CBC
Hemoglobin: 9.9 g/dL — ABNORMAL LOW (ref 12.0–15.0)
Platelets: 238 10*3/uL (ref 150–400)
RBC: 3.5 MIL/uL — ABNORMAL LOW (ref 3.87–5.11)
WBC: 16.4 10*3/uL — ABNORMAL HIGH (ref 4.0–10.5)

## 2011-01-01 LAB — BASIC METABOLIC PANEL
BUN: 12 mg/dL (ref 6–23)
CO2: 30 mEq/L (ref 19–32)
Chloride: 101 mEq/L (ref 96–112)
Creatinine, Ser: 1.06 mg/dL (ref 0.4–1.2)
Glucose, Bld: 191 mg/dL — ABNORMAL HIGH (ref 70–99)
Potassium: 4.4 mEq/L (ref 3.5–5.1)

## 2011-01-01 LAB — GLUCOSE, CAPILLARY: Glucose-Capillary: 182 mg/dL — ABNORMAL HIGH (ref 70–99)

## 2011-01-01 LAB — LACTIC ACID, PLASMA: Lactic Acid, Venous: 1.6 mmol/L (ref 0.5–2.2)

## 2011-01-02 ENCOUNTER — Inpatient Hospital Stay (HOSPITAL_COMMUNITY): Payer: Medicaid Other

## 2011-01-02 DIAGNOSIS — G4733 Obstructive sleep apnea (adult) (pediatric): Secondary | ICD-10-CM

## 2011-01-02 LAB — BLOOD GAS, ARTERIAL
Bicarbonate: 27.5 mEq/L — ABNORMAL HIGH (ref 20.0–24.0)
Drawn by: 31101
O2 Saturation: 95.3 %
PEEP: 5 cmH2O
RATE: 16 resp/min
pCO2 arterial: 43 mmHg (ref 35.0–45.0)
pO2, Arterial: 69.7 mmHg — ABNORMAL LOW (ref 80.0–100.0)

## 2011-01-02 LAB — CBC
Hemoglobin: 8.9 g/dL — ABNORMAL LOW (ref 12.0–15.0)
MCH: 28.2 pg (ref 26.0–34.0)
Platelets: 206 10*3/uL (ref 150–400)
RBC: 3.16 MIL/uL — ABNORMAL LOW (ref 3.87–5.11)
WBC: 15.6 10*3/uL — ABNORMAL HIGH (ref 4.0–10.5)

## 2011-01-02 LAB — COMPREHENSIVE METABOLIC PANEL
ALT: 17 U/L (ref 0–35)
AST: 25 U/L (ref 0–37)
Albumin: 2.3 g/dL — ABNORMAL LOW (ref 3.5–5.2)
Alkaline Phosphatase: 39 U/L (ref 39–117)
CO2: 27 mEq/L (ref 19–32)
Chloride: 105 mEq/L (ref 96–112)
Creatinine, Ser: 1 mg/dL (ref 0.4–1.2)
GFR calc Af Amer: >60 mL/min (ref >60)
GFR calc non Af Amer: 58 mL/min — ABNORMAL LOW (ref >60)
Potassium: 4.1 mEq/L (ref 3.5–5.1)
Sodium: 140 mEq/L (ref 135–145)
Total Bilirubin: 0.4 mg/dL (ref 0.3–1.2)

## 2011-01-02 LAB — BRAIN NATRIURETIC PEPTIDE: Pro B Natriuretic peptide (BNP): <30 pg/mL (ref 0.0–100.0)

## 2011-01-02 LAB — GLUCOSE, CAPILLARY
Glucose-Capillary: 163 mg/dL — ABNORMAL HIGH (ref 70–99)
Glucose-Capillary: 171 mg/dL — ABNORMAL HIGH (ref 70–99)
Glucose-Capillary: 135 mg/dL — ABNORMAL HIGH (ref 70–99)

## 2011-01-02 LAB — LACTIC ACID, PLASMA: Lactic Acid, Venous: 2 mmol/L (ref 0.5–2.2)

## 2011-01-03 ENCOUNTER — Inpatient Hospital Stay (HOSPITAL_COMMUNITY): Payer: Medicaid Other

## 2011-01-03 LAB — BLOOD GAS, ARTERIAL
Acid-Base Excess: 1.2 mmol/L (ref 0.0–2.0)
FIO2: 0.4 %
O2 Saturation: 96.6 %
TCO2: 25.5 mmol/L (ref 0–100)
pCO2 arterial: 50 mmHg — ABNORMAL HIGH (ref 35.0–45.0)
pO2, Arterial: 84.4 mmHg (ref 80.0–100.0)

## 2011-01-03 LAB — COMPREHENSIVE METABOLIC PANEL
ALT: 15 U/L (ref 0–35)
AST: 23 U/L (ref 0–37)
CO2: 28 mEq/L (ref 19–32)
Chloride: 107 mEq/L (ref 96–112)
Creatinine, Ser: 0.72 mg/dL (ref 0.4–1.2)
GFR calc Af Amer: >60 mL/min (ref >60)
GFR calc non Af Amer: >60 mL/min (ref >60)
Glucose, Bld: 128 mg/dL — ABNORMAL HIGH (ref 70–99)
Sodium: 141 mEq/L (ref 135–145)
Total Bilirubin: 0.4 mg/dL (ref 0.3–1.2)

## 2011-01-03 LAB — CBC
HCT: 24.9 % — ABNORMAL LOW (ref 36.0–46.0)
Hemoglobin: 7.8 g/dL — ABNORMAL LOW (ref 12.0–15.0)
MCH: 27.9 pg (ref 26.0–34.0)
MCHC: 31.3 g/dL (ref 30.0–36.0)
RBC: 2.8 MIL/uL — ABNORMAL LOW (ref 3.87–5.11)

## 2011-01-03 LAB — GLUCOSE, CAPILLARY
Glucose-Capillary: 128 mg/dL — ABNORMAL HIGH (ref 70–99)
Glucose-Capillary: 147 mg/dL — ABNORMAL HIGH (ref 70–99)
Glucose-Capillary: 129 mg/dL — ABNORMAL HIGH (ref 70–99)
Glucose-Capillary: 122 mg/dL — ABNORMAL HIGH (ref 70–99)
Glucose-Capillary: 123 mg/dL — ABNORMAL HIGH (ref 70–99)

## 2011-01-04 ENCOUNTER — Inpatient Hospital Stay (HOSPITAL_COMMUNITY): Payer: Medicaid Other

## 2011-01-04 LAB — GLUCOSE, CAPILLARY: Glucose-Capillary: 131 mg/dL — ABNORMAL HIGH (ref 70–99)

## 2011-01-04 LAB — BASIC METABOLIC PANEL
BUN: 5 mg/dL — ABNORMAL LOW (ref 6–23)
Creatinine, Ser: 0.81 mg/dL (ref 0.4–1.2)
GFR calc non Af Amer: >60 mL/min (ref >60)
Glucose, Bld: 130 mg/dL — ABNORMAL HIGH (ref 70–99)
Potassium: 3.4 mEq/L — ABNORMAL LOW (ref 3.5–5.1)

## 2011-01-04 LAB — CBC
HCT: 25.2 % — ABNORMAL LOW (ref 36.0–46.0)
MCH: 27 pg (ref 26.0–34.0)
MCHC: 30.6 g/dL (ref 30.0–36.0)
MCV: 88.4 fL (ref 78.0–100.0)
RDW: 15.7 % — ABNORMAL HIGH (ref 11.5–15.5)
WBC: 12.3 10*3/uL — ABNORMAL HIGH (ref 4.0–10.5)

## 2011-01-05 LAB — CBC
HCT: 27 % — ABNORMAL LOW (ref 36.0–46.0)
Platelets: 292 10*3/uL (ref 150–400)
RBC: 3.08 MIL/uL — ABNORMAL LOW (ref 3.87–5.11)
RDW: 15.5 % (ref 11.5–15.5)
WBC: 12.5 10*3/uL — ABNORMAL HIGH (ref 4.0–10.5)

## 2011-01-05 LAB — GLUCOSE, CAPILLARY

## 2011-01-06 LAB — GLUCOSE, CAPILLARY
Glucose-Capillary: 146 mg/dL — ABNORMAL HIGH (ref 70–99)
Glucose-Capillary: 125 mg/dL — ABNORMAL HIGH (ref 70–99)

## 2011-01-06 NOTE — Discharge Summary (Signed)
Lisa Ewing, Lisa Ewing                ACCOUNT NO.:  [id number]  MEDICAL RECORD NO.:  [id number]           PATIENT TYPE:  I  LOCATION:  1609                         FACILITY:  [facility]  PHYSICIAN:  Madlyn Frankel. Charlann Boxer, M.D.  DATE OF BIRTH:  08-02-1956  DATE OF ADMISSION:  12/31/2010 DATE OF DISCHARGE:  01/06/2011                              DISCHARGE SUMMARY   ADMITTING DIAGNOSIS:  Right hip osteoarthritis.  DISCHARGE DIAGNOSES: 1. Right hip osteoarthritis status post right total-hip replacement on     December 31, 2010. 2. Osteoporosis. 3. Asthma. 4. Hypertension. 5. Chronic obstructive pulmonary disease with significant smoking     history. 6. Hypothyroidism. 7. Chronic back pain. 8. Hyperlipidemia. 9. Reflux disease. 10.Questionable aspiration pneumonia following the necessity of a     repeat intubation postoperatively  ADMITTING HISTORY:  Lisa Ewing is a 55 year old female who presented to the office with longstanding difficulties with mobility reporting a difficult time with mobility over the past 4 years.  She had advanced right hip osteoarthritis and after reviewing the risks and benefits in association with her chronic back issues, she wished at this point to proceed with an elective right hip replacement surgery through an anterior approach.  HOSPITAL COURSE:  The patient was admitted for same-day surgery on December 31, 2010.  At the time she underwent a right total-hip replacement through an anterior approach.  Please see dictated operative note for full details of the procedure.  Postoperatively she was transferred to the recovery room.  She remained in the recovery room for several hours under close observation and she was slow to wake up and with her pulmonary history, she was eventually placed on a BiPAP machine and still unable to adequately ventilate and ABG was performed indicating concern for respiratory acidosis.  The decision was made at that time to reintubate her  and transfer her to the Intensive Care Unit.  There were no acute events.  She remained intubated overnight and through postoperative day #1.  By postoperative day #2, she was extubated and remained stable throughout her time.  There were concerns around the time of this repeat intubation and postoperatively evaluated through the Critical Care medical folks that perhaps she had had an aspiration pneumonia event after her chest x-ray was evaluated.  She never had fever in the hospital.  She was placed on Avelox perioperatively.  By postoperative day #3, she was transferred out of the Intensive Care Unit to the Orthopedic Ward for over the weekend.  She was seen and evaluated by Physical Therapy and Occupational Therapy.  She was very pleased with the overall progress as she made immediate gains  I had placed her on 50% weightbearing restriction.  There were no other perioperative events.  She remained in the hospital over the weekend predominantly due to therapy as well as to try to increase her overall activity level.  By postoperative day #5, she did have repeat laboratories indicating a white blood cell count of 12.5 and hematocrit 27.0, not requiring transfusion through the hospital stay.  Her vital signs remained stable. Her hip wound was dry at the time  of discharge.  On postoperative day #6, on January 06, 2011, Lisa Ewing expressed her pleasure with the way things had been going.  She was worried about the events and was at this point making a personal decision at this point to quit smoking and try to improve her overall quality of life.  She is moving around more now, she states, better than she has in the last 4 years and is very pleased and optimistic about her future with this hip.  DISCHARGE CONDITION:  She is stable at the time of discharge and improved with regards to her right hip condition.  DISCHARGE INSTRUCTIONS:  She is going to be discharged to home with  home health physical therapy.  She will be partial weightbearing until further directed in the office.  She may shower but needs to keep her wound as dry as possible.  It was stable at the time of discharge.  She will be on a regular diet.  She needs to work on smoking cessation and I think that the events at this point may have startled her enough to improve her overall outlook in terms of cessation.  She will return to see Dr. Durene Romans at [facility] at office number [phone] in two weeks' time for a wound evaluation. Radiographs will be obtained at 6 weeks.  I will probably keep her partial weightbearing for 4 to 6 weeks based on her intraoperative findings.  DISCHARGE MEDICATIONS:  Her discharge medications at this point will include: 1. Aspirin 325 mg p.o. b.i.d. for 30 days. 2. Robaxin 500 mg every 6 hours as needed for muscle spasm and pain. 3. Avelox 400 mg daily for 7 days due to the concern for aspiration at     the time of repeated aspiration. 4. Norco 5/325 one to two tablets every 6 to 8 hours as needed. 5. MiraLax 17 grams p.o. daily as needed for constipation. 6. Colace 100 mg p.o. b.i.d. as needed for constipation while on pain     medicine. 7. Acyclovir 400 mg q.h.s. 8. Advair Diskus one puff b.i.d. 9. Albuterol inhaler two puffs as needed every 4 hours. 10.Azelastine one spray daily as needed. 11.Boniva 150 mg p.o. monthly. 12.Calcitriol 25 mcg q.a.m. 13.Crestor 20 mg q.h.s. 14.Flexeril 10 mg q.a.m. 15.Evista 60 mg q.a.m. 16.Lasix 20 mg q.a.m. 17.Gabapentin 300 mg b.i.d. 18.Ipratropium/albuterol nebulizer every 4 hours as needed for     shortness of breath. 19.Levothyroxine 125 mg q.a.m. 20.Lexapro 20 mg q.a.m. 21.Meloxicam daily as needed 7.5 mg. 22.Nexium 40 mg b.i.d. 23.Niacin 500 mg q.h.s. 24.Singulair 10 mg q.h.s. 25.Trazodone 100 mg q.h.s. 26.Trilipix one tablet q.a.m. 135 mg. 27.Vitamin D3 q.a.m. 28.Zyrtec q.h.s. as  needed.  Any orthopedic questions can be addressed to [facility]. Any medical issues she should follow up with her primary physician.     Madlyn Frankel Charlann Boxer, M.D.     MDO/MEDQ  D:  01/06/2011  T:  01/06/2011  Job:  [id number]  Electronically Signed by Durene Romans M.D. on 01/06/2011 12:15:09 PM

## 2011-01-21 NOTE — Procedures (Signed)
NAME:  Lisa Ewing, Lisa Ewing                ACCOUNT NO.:  [id number]   MEDICAL RECORD NO.:  [id number]           PATIENT TYPE:   LOCATION:                                FACILITY:  [facility]   PHYSICIAN:  Kofi A. Gerilyn Pilgrim, M.D. DATE OF BIRTH:  23-Apr-1956   DATE OF PROCEDURE:  06/30/2008  DATE OF DISCHARGE:  06/30/2008                             SLEEP DISORDER REPORT   PROCEDURE:  Nocturnal polysomnography.   REFERRING PHYSICIAN:  Broadus John T. Pickard II, MD   INDICATIONS FOR PROCEDURE:  This is a 55 year old who presents with  significant snoring and is being evaluated for obstructive sleep apnea  syndrome.   MEDICATIONS:  Astelin, hydrocodone, levothyroxine, acyclovir, Vicodin,  Rhinocort, furosemide, Singulair, Benadryl, Nexium, Combivent,  trazodone, Evista, Zyrtec, Flexeril, Boniva, Advair, vitamin D, Lexapro,  simvastatin.   EPWORTH SLEEPINESS SCALE:  10.   ARCHITECTURAL SUMMARY:  The total recording time is 410 minutes.  Sleep  efficiency 78%.  Sleep latency 34 minutes.  REM latency 101 minutes.   STAGE N1:  0%.   STAGE N2:  25%.   STAGE N3:  52%.   REM SLEEP:  23%.   RESPIRATORY SUMMARY:  The baseline oxygen saturation is 95%.  Lowest  saturation 83%.  AHI is 23 with most of the events occurring during REM  sleep.  The REM AHI is 37.  A lot of the events were obstructive but she  had all central and mixed events.   LIMB MOVEMENTS:  PLM index is 8.7.   ELECTROCARDIOGRAM SUMMARY:  No dysrhythmias observed, average heart rate  is 72.   IMPRESSION:  1. 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  The patient did not meet criteria for split night study.  2. Central sleep apnea syndrome.  3. Mild periodic limb movement disorder sleep.   RECOMMENDATIONS:  Suggest formal nocturnal titration study.   Thank you for this referral.      Kofi A. Gerilyn Pilgrim, M.D.  Electronically Signed     KAD/MEDQ  D:  07/08/2008  T:  07/08/2008  Job:   [id number]
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
## 2011-01-21 NOTE — Assessment & Plan Note (Signed)
[name] is a 55 year old divorced female who has followed in  our [facility] for multiple chronic pain complaints.  She felt to have left lower extremity pain and weakness, which may be  secondary to L4-5 disk protrusion affecting possibly left L5 root as  well as right S1 root and the right paracentral disk protrusion at L5-  S1.  A repeat MRI is pending.  She also has had a trochanteric bursitis  bilaterally, worse on the right than on the left and is known to have a  right foot capital femoral epiphysis per MRI within the last year.   At the last visit on May 6, she had excluded tenderness along the right  trochanter and she had opted to undergo right trochanteric bursitis  injection using 1 mL of Kenalog and 4 mL of 1% lidocaine.  The area was  prepped with Betadine x2 and wiped with alcohol, injected without  complication.  She reports some improvement in symptoms after the  injection.  She was given post-injection instructions on that day.   She called the clinic on 11th and I returned her call the next day.  She  stated she had an area that was itchy, look localized, located along the  right lateral posterior leg.  She was unsure if she had gotten a bug  bite or a spider bite, and her aid had decided to put some betamethasone  cream on the area.  She states she thought it might be getting better,  but wanted [place] to look at it.   I called her yesterday, brought her in this morning for evaluation.   She states that the area seems to be getting somewhat better, mostly it  is itchy at this time and feels more superficial.   Exam today; blood pressure is 142/62, pulse 93, respirations 18, 96%  saturated on room air, her temperature is 97.4 orally.  She is a well-  developed, well-nourished female who does not appear in any distress.  She is oriented x3.  Speech is clear.  Affect is bright.  She is alert,  cooperative and pleasant.  Follows commands  without difficulty, answers  questions appropriately.   Coordination is intact.  Reflexes are 1+ at the left knee, 0 at the  right knee, 0 at the ankles bilaterally.  She has decreased sensation in  the right lower extremity, especially below the knee.  Motor strength in  the right lower extremities generally in a 3/5 range.  In the left, it  is 4+/5.  Straight leg raise is positive on the left.  She has  tenderness over both trochanters.   The skin is examined over the right lateral thigh.  She has an area  measuring 3 cm with a tiny gout in the center.  The center of the area  has normal-looking skin, the outer ring with a diameter about 3 cm, has  multiple small papules surrounding the center area.  The area is  nontender to palpation.  She states it is itchy at this time.  There is  no area below the skin that I can palpate where a mass is felt, in fact  she has no tenderness around this area.  About 7 cm more proximal along  the trochanter, she is still at tender as she is on the left side.   IMPRESSION:  Right lateral thigh skin lesion measuring about 3 cm with a  central scab and multiple papules in a  circumferential distribution  around the scab, which are itchy.  I did not feel this is related to the  injection which was done on Jan 11, 2009.  She states that she is not  sure whether this might be a spider or a bug bite.  She states she has  an allergy to PENICILLIN.  We will start her on some doxycycline 100 mg  b.i.d. x10 days.  I will see her back next Friday in about 7 days.  If  she gets worse or develop fever and redness, we would recommend she  followup in emergency room, urgent care, or her primary care doctor's  office between now and the time that I will see her next  Friday.  She is currently stable without evidence of an injection site  infection.  I suspect with the history of itchiness, this may be some  kind of bite.           ______________________________   Brantley Stage, M.D.     DMK/MedQ  D:  01/18/2009 09:35:31  T:  01/18/2009 23:00:48  Job #:  [id number]   cc:   Dr. Tanya Nones

## 2011-01-21 NOTE — Assessment & Plan Note (Signed)
Lisa Ewing is a 55 year old divorced white female who has already  followed in our [facility] for multiple chronic pain  complaints.  She is back in today for refill of her medications.  She is  complaining more today of  right lateral hip pain as well as increased  pain through the posterior right hip down the leg laterally and into the  lateral aspect of her right foot.  She states she has had a miserable  month due to the right leg pain.  She has an average of 8 on a scale of  10 pain today.  Pain is fairly constant.  Sleep has been fair.  Pain is  worse with basically any type of activity and even occurs while she is  at rest.  Pain seems to improves some with pacing her activities,  medications, TENS unit.  She gets fair relief with current meds.   MEDICATIONS:  Prescribed through this clinic include Vicodin 10/500 up  to 3 times a day and recently started Neurontin.  She was on 400 mg 1  p.o. q.i.d. and has tolerated this well and reported overall improvement  in her pain.   FUNCTIONAL STATUS:  She is unable to ambulate at this time.  She has  difficulty with transfers and needs assistance.  She uses an Research scientist (life sciences).   She is independent with feeding, requires assistance with the rest of  her ADLs.   REVIEW OF SYSTEMS:  Positive for night sweats, nausea, constipation,  sleep apnea, wheezing.  She also admits to some depression, anxiety,  dizziness, trouble walking, tingling, numbness, tremors and weakness,  especially the right lower extremity.   PAST MEDICAL HISTORY:  No changes in her past medical history.  She  continues to maintain contact with Dr. Tanya Nones.  She has a history of  shingles hypertension, asthma, hypothyroidism and depression, as well as  hypercholesterolemia.   SOCIAL HISTORY:  She does smoke cigarettes half pack a day.   FAMILY HISTORY:  Unchanged.   Exam; blood pressure is 111/66, pulse 103, respiration 18, and 96%  saturated on room air.  She is an obese white female who appears  uncomfortable today.  She is sitting, leaning towards her left side.   She is oriented x3.  Speech is clear.  Affect is alert.  She is  cooperative, pleasant.  Follows commands without difficulty and answers  questions appropriately.   Cranial nerves are grossly intact.  Coordination is intact.  Reflexes  are diminished in the right lower extremity at the patellar and Achilles  tendons, 2+ at the left patellar tendon, 0 at the left Achilles tendon.  She has a slight tremor in both upper extremities.   Motor strength in the right lower extremity is 3/5, right knee extensors  are 4+, dorsiflexor and plantar flexor in the 4+/5 range.  On the left,  5/5 strength at hip flexors, knee extensors, dorsiflexors, and plantar  flexors.   Straight leg raise is positive on the right today.   She is able to stand briefly with weight bearing through the left lower  extremity.  Transfers are difficult for her, as she attempts to keep  weight off the right lower extremity.  She does have tenderness along  the trochanter on the right as well, and she has decreased sensation  along the lateral aspect of the right lower extremity.   IMPRESSION:  Right lower extremity pain, multifactorial in nature.  She  has a history  of right epiphyseal injury of the hip.  She has tenderness  along the right trochanter as well today and she has evidence of sciatic  symptoms in the right lower extremity as well.  It is difficult to  discern how much she to these issues are contributing to the pain in the  right lower extremity.  At this time, I believe there is a good amount  of sciatica here today.   She also has multiple other pain problems which include history of  lumbar laminectomy at L5-S1 in 1995.  She has a right paracentral disk  protrusion at L5-S1, which back in November was possibly affecting the  right S1 root; history of facet arthropathy  and fibromyalgia;  acromioclavicular joint; osteoarthritis of the left shoulder; rotator  cuff pathology of the left shoulder; cervical spondylosis at C5-6 and C6-  7; and has had a history of bilateral trochanteric bursitis.   PLAN:  The patient was given a consent to do a right trochanteric  bursitis injection, 1 mL of Kenalog and 4 mL of 1% lidocaine were  injected without complication into the right hip.  She tolerated the  procedure well, reported overall improvement in the right lateral hip  pain after the procedure.  She was given post-injection instructions  over the next several days.  Would also like to repeat lumbar MRI on her  given that she has worsening of her sciatic symptoms, we will get this  set up for her today.  We will refill her Neurontin 600 mg 1 p.o. q.i.d.  #120 and we will refill Vicodin 10/500 one p.o. t.i.d. p.r.n. right leg  pain and back pain #90.  We will hold off on physical therapy until  after the MRI, may discuss the patient further with Dr. Tanya Nones should  she have further evidence that this right S1 root is problematic.  I  will see her back in 2 weeks.  We will have to work her.           ______________________________  Brantley Stage, M.D.     DMK/MedQ  D:  01/11/2009 12:20:03  T:  01/12/2009 02:03:37  Job #:  [id number]   cc:   Dr. Tanya Nones

## 2011-01-21 NOTE — Assessment & Plan Note (Signed)
Lisa Ewing is a 55 year old divorced woman who is followed in our  [facility] for multiple pain complaints.  She is  back in today for refill of her medications.  Lisa Ewing has a very  complicated pain history and has multiple pain complaints.  She has a  history of bilateral shoulder pain, thoracic pain, low back pain, as  well as bilateral hip pain, which radiates predominantly down the right  lower extremities and she has bilateral upper extremity pain and  intermittent numbness.   She has been seen by Dr. Merlyn Lot over the last couple of months.  He has  been managing a neuroma in her left hand.  She underwent injection last  month, which apparently provided her some relief.   She also saw Dr. Charlann Boxer, Orthopedic surgery; however, I have no notes  detailing his consult at this point.   Lisa Ewing also has a long history of fibromyalgia.   Diagnostic workup has included bone scan, which is essentially negative  for evidence of any kind of metastatic disease.  Thoracic MRI was  negative for any acute findings or notable degenerative changes.  She  had a normal cervical flexion and extension films.  MRI of the lumbar  spine showed at L5 right paracentral shallow disk possibly contacting  right S1 root.   Her average pain has been about a 9 on a scale of 10, interfering  significantly with general activity.  Pain is constant throughout the  day, described as sharp, burning, stabbing, tingling, and aching.   Sleep is poor, sleeping about 4 hours at night with CPAP.   Pain is worse with activity, improves with rest, medication, and TENS  unit.   She reports fair relief with current meds.   Medications prescribed from this clinic for pain management include:  1. Vicodin 10/500 up to 3 times a day.  2. Neurontin 300 mg q.i.d.  Neurontin was titrated up last month.  She      reports that doing well on the medication without significant side      effects.   FUNCTIONAL STATUS:  The patient is essentially wheelchair bound.  She  does use an Passenger transport manager.  She has difficulty with transfers.  She does not drive.   She requires assistance in dressing, bathing and toileting.  She is  independent with feeding.   She has multiple neurologic complaints including numbness, tremors,  tingling, trouble walking, spasms, dizziness, confusion, depression, and  anxiety.  Denies suicidal ideation.  Also, admits to generalized  weakness and an occasional bladder control problems.   REVIEW OF SYSTEMS:  Positive for weight gain, night sweats, diarrhea,  nausea, constipation, poor appetite, limb swelling, sleep apnea, and  wheezing.  She does maintain contact with primary care physician for  these other complaints.  Her primary care physician is Dr. Allayne Butcher.   No other changes in past medical, social, or family history since last  visit.   PHYSICAL EXAMINATION:  VITAL SIGNS:  Today, blood pressure is 144/80,  pulse 108, respirations 20, and 92% saturated on room air.  GENERAL:  She is an obese female who appears her stated age and does not  appear in any distress.  NEUROLOGIC:  She is oriented x3.  Speech is clear.  Affect is bright.  She is alert, cooperative, and pleasant.  Follows commands without  difficulty and answers questions appropriately.   Cranial nerves are grossly intact.  Coordination is intact.   EXTREMITIES:  Reflexes are 1+ in the upper extremities and 1+ at  patellar tendon, diminished at the Achilles tendons bilaterally.  She  has intact sensation in the upper extremities report some diminished  sensation in the right lower extremity along the lateral aspect of the  right lower leg.   No abnormal tone is noted.  No clonus is noted.  She does appear to have  tremor intermittently during our interview and exam.  She does lean  somewhat over to the left one during our interview and taking weight off  her right hip.   She  did not stand for me today.  She has limited motion in bilateral  shoulders with abduction less than 90 degrees in both shoulders are  noted.  Diminished external rotation is also noted bilaterally.  She has  some tenderness over the anterior aspect of the left humeral head with  palpation.   Lower extremity strength is on the left is in the general 4/5 range and  on the right, she has difficulty giving full effort due to pain in the  right lower extremity during manual muscle testing.  In general, she has  antigravity strength and possibly in the 4/5 range.  However, she has  quite a bit of give-way weakness.  She has tenderness over both  trochanters with palpation.   NECK:  Range of motion is mildly limited.   Multiple areas of tenderness throughout the cervical paraspinal muscles  as well as thoracic paraspinal muscles and parascapular muscles.   IMPRESSION:  1. History of lumbar laminectomy in 1995 at L5-S1.  2. Right paracentral disk protrusion at L5-S1, possibly effecting      right S1 nerve root.  3. L4-5 paracentral disk protrusion possibly that affecting left L5      nerve root.  4. History of facet arthropathy.  5. History of fibromyalgia.  6. Left shoulder acromioclavicular joint arthritis.  7. Probable left shoulder rotator cuff pathology.  8. Cervical spondylosis C5-6, C6-7.  9. History of right epiphyseal injury of the right hip with small      effusion.  10.History of left hand neuroma per Dr. Merlyn Lot.  11.Bilateral trochanteric bursitis.   PLAN:  We will refill her Vicodin 10/500 one p.o. t.i.d. p.r.n. hip  pain, #90.  We will increase her Neurontin slightly from 300 mg q.i.d.  to 400 mg q.i.d., #120.   We will arrange for home health physical therapy to start addressing her  shoulder range of motion and strength to see if we can improve this to  help her with transfers.  I would also like them to start on doing some  range of motion  in the left hip as well  to assist with transfers moving toward states  independent transfers.  We will see her back in a month.           ______________________________  Brantley Stage, M.D.     DMK/MedQ  D:  12/13/2008 12:26:39  T:  12/14/2008 00:58:02  Job #:  [id number]

## 2011-01-21 NOTE — Assessment & Plan Note (Signed)
[name] is a 55 year old divorced female who is followed in our  [facility] for multiple chronic pain complaints.  Her chief complaint continues to be left lower extremity pain and  weakness.  She had a repeat MRI of her lumbar spine done Jan 29, 2009,  read by Dr. Purcell Mouton, which shows apparent chronic posterolateral disk  protrusion on the right covering for at L5-S1 with chronic S1 right root  encroachment.  She also has small left paracentral disk protrusion at L4-  L5 with possible chronic left L5 root encroachment.  The results of the  MRI were discussed with her.   She continues to complain of pain radiating down the posterior right  leg, burning in the bottom of her foot and numbness in the lateral  aspect of her right foot as well as weakness.  Pain in the low back with  coughing and sneezing.  Unable to really bear weight on the right side  much at all due to significant leg pain.   She reports a fall about 5 days ago prior to her MRI, when she fell  against her bed.  She states she has had quite a bit of right hip  soreness from this fall.   Her average pain is about 9 on the scale of 10.  Pain is worse with  walking, bending, sitting, standing, improves with rest, heat,  medications, and TENS unit.  Reports good relief with current meds.  Pain is described as constant, especially in the right leg.  She also  has other areas, which bother her in the scapular region and the right  shoulder.  However, main complaint today is right leg pain and low back  pain.   FUNCTIONAL STATUS:  She is really not ambulatory.  She does not drive.  She is independent with feeding and toileting, needs assistance with  dressing and bathing.   Reports intermittent problems with bladder due to mobility.  Admits to  some depression and anxiety, but denies suicidal ideation.   REVIEW OF SYSTEMS:  Otherwise noncontributory and are attached to chart.   Past medical,  social and family history unchanged.   MEDICATIONS:  Prescribed through this clinic include Vicodin 10/500  times up to 3 times per day, Neurontin 600 mg 4 times a day, and  trazodone 100 mg at bedtime.   PHYSICAL EXAMINATION:  Blood pressure is 128/57, pulse 86, respiration  18, and 95% saturated on room air.  She is an obese female who does not  appear in any distress.  She is oriented x3.  Speech is clear.  Affect  is bright.  She is alert, cooperative and pleasant.  Follows commands  without difficulty, answers my questions appropriately.  Unable to stand  today due to right leg pain.   Reflexes are 2+ patellar tendon on the right and on the left 0 at the  right Achilles, 1 at the left Achilles.  No abnormal tone is noted.  No  clonus is noted.  No tremors are appreciated.   Positive straight leg raise on the right.  She has 3/5 strength with  right hip flexion and right knee extension, EHL on the right is 4/5,  plantar flexion is 4/5, dorsiflexion 4/5 on the right.  On the left  generally 4+/5 strength without focal deficits on the left.   She reports decreased sensation to pinprick along the right lateral leg  and right lateral foot, left appears to be intact to  pinprick sensation.   Tenderness over both hips.  Pain with internal and external rotation on  the right today, complaining of posterior hip pain with this.   The right lateral hip was also evaluated, she has a ecchymosis along the  right lateral hip measuring about 3 x 1 cm and she also has an area over  the superior sacral area measuring 5 x 2 cm of an ecchymosis as well.  Tenderness is noted over these areas as well, the area which had been  noted over the last week or so, which is also be a bug bite over the  right lateral hip is approximately 3 cm in diameter with flaky skin now.  This area is nontender.  No induration is appreciated.   IMPRESSION:  1. Resolving insect bite along the right lateral hip.  2.  Chronic right leg pain most likely secondary to chronic right S1      root compression.  3. Recent fall with right hip pain.   PLAN:  We will continue current medications, Vicodin, Neurontin, and  trazodone.   We will also set her up for some right hip x-rays to rule out a hip  fracture status post recent well.  We will also discuss case further  with Dr. Lamar Benes regarding having her follow up with Orthopedic or  neurosurgeon for further evaluation of her spine with respect to the  right S1 root compression.  She has had injections in the past, epidural  steroid injections in the past, which has helped for about 1 week, but  not really much more.  She continues to have burning in the bottom of  the right foot and difficulty weightbearing on the leg due to the  shooting pain through the right lower extremity.  Neurontin has helped  somewhat in this case as well, but she is still quite limited  functionally by this leg pain.  We will check hip radiographs in the  next couple days after she arranges to have these done.           ______________________________  Brantley Stage, M.D.     DMK/MedQ  D:  01/31/2009 09:05:46  T:  02/01/2009 00:41:36  Job #:  [id number]   cc:   Dr. Lamar Benes

## 2011-01-21 NOTE — Assessment & Plan Note (Signed)
Lisa Ewing is a 55 year old divorced woman who is accompanied by Marney Setting who is her aid.  She was referred by Dr. Tanya Nones.  Lisa Ewing has  a very complicated pain history.  She has chronic cervicalgia, history  of a right rotator cuff injury, history of cervical spondylosis, history  of thoracic spondylosis, history of lumbar degenerative changes with  disk protrusion at L4-L5 and L5-S1 facet arthropathy.  She has a history  of fibromyalgia in the past as well as a tendinopathy along the gluteal  insertions of the greater trochanter.  She has a history of right hip  effusion and significant right hip pain.   She had been followed by Dr. Alveda Reasons.  Apparently, she is not followed by  him any more and is currently being followed by her primary care doctor,  Dr. Tanya Nones.  I spoke with Dr. Tanya Nones last week regarding possible  further workup on her breathing.  Lisa Ewing does have a history of some  problems with benign growth in her throat.  She has had thyroidectomy as  well in the past and had apparently been placed on CPAP at one point,  but she does not use it and she believes that her breathing may be  getting more difficult at night and I have asked Lisa Ewing to follow up  with Dr. Tanya Nones regarding this, she states she will comply with this  recommendation.   Her chief complaint today is right hip pain.  She also has some right  shoulder pain which is not as big a problem and she has some pain in the  mid thoracic region.  She states that she has some difficulty managing  her bladder function.  I am not clear whether this is due to her  immobility or she does have some bladder control issues.   Her average pain is about 9 on a scale of 10, located across the low  back, radiates through the right hip down the right leg, and also into  the right knee.   Sleep tends to be poor.  Pain overall, she indicates that it completely  interferes with any activity.  Pain is constant,  tingling, aching,  stabbing, sharp, and burning in nature.  Pain is worse with activities,  but also bothers her when she is sitting for a long periods of time.  She needs to sit on the left side of her body rather than the right  because her right hip hurts to a greater extent when she sits evenly  with weight distributed through both hips.  Pain improves with rest and  medication.  She gets fair-to-good relief with current medications.   Pain medication through Dr. Caren Macadam office includes hydrocodone  10/500, she takes up to 3 per day.   FUNCTION:  Lisa Ewing can only be up about less than 1 minute, she needs  assistance during this period.  She does not really ambulate  independently.  She uses a walker or a wheelchair most of the time.  She  is unable to climb stairs.  She stopped driving in 1610.  She is  independent with feeding, but requires assistance for dressing, bathing,  toileting, meal prep, household duties, and shopping.   Her Oswestry Disability questionnaire revealed 90% disability.   She admits to depression and anxiety.  Denies suicidal ideation.  Denies  any specific numbness.   Admits to some constipation, limb swelling, and wheezing.   PAST MEDICAL HISTORY:  Unchanged.  SOCIAL HISTORY:  Unchanged.   FAMILY HISTORY:  Unchanged.   She does note she was treated for a urinary tract infection with Cipro.   MEDICATION INTOLERANCE:  She has been on LYRICA in the past by Dr.  Edilia Bo.  She had some hand and foot swelling while she was on LYRICA  and was taken off of the medication.   PHYSICAL EXAMINATION:  VITAL SIGNS:  On exam today, her blood pressure  is 141.74, pulse 89, respirations 18, and 95% saturated on room air.  GENERAL:  She is an obese female who appears uncomfortable in the  wheelchair as we discussed her history with her.  She is oriented,  however, her speech is clear.  Her affect is bright.  She is alert and  cooperative.  She follows  commands without difficulty.  She has 1+  reflexes in the upper extremities.  Her lower extremities are somewhat  more brisk, a couple of beats of clonus on the left.  She lacks Achilles  reflex on the right, but the right knee is slightly brisk as well as the  left knee and the left ankle.  She reports no specific numbness in the  lower extremities with light touch.  She is unable to lift her right leg  today for me.  She has difficulty lifting the left leg due to pain in  the hip region and down the leg.  She is able to extend the left knee  and move the left ankle.  Her strength in the left leg is in the 4/5  range.  Right lower extremity, she really unable to lift the leg or the  knee.  Ankle strength is in the 4/5 range.   She is able to stand briefly with assistance.  She has difficulty doing  stand pivot transfer from the wheelchair to the exam table.  Palpation  down her cervical and thoracic spine, she states that there is increased  tenderness at about T7-T8 with palpation.  She reports patchy decreased  sensation to pinprick in multiple thoracic dermatomes.   IMPRESSION:  1. History of cervical spondylosis per Dr. Bess Harvest note from Jan 29, 2006.  History of thoracic spondylosis per same note.  2. Morbid obesity.  3. Fibromyalgia.  4. Postlaminectomy lumbar pain.  5. Right hip effusion.  6. Trochanteric bursitis.  7. Lumbar degenerative disk disease.  8. Lumbar facet arthropathy.  9. Right shoulder status post rotator cuff repair.  10.Medical problems include history of some kind of benign throat      growth back in 2007, for which she had surgery for.  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   PLAN:  1. At this point, we will obtain cervical flexion and extension      radiographs including AP as well as thoracic radiographs.  We will      also move toward thoracic MRI in light of lower extremity weakness,      patchy numbness in the thoracic region, and history of bladder      incontinence and hyporeflexia.  2. In the upcoming months, may consider further  workup for shoulder      pain.  Apparently, she does have a history of a rotator cuff      repair.  She is unable to abduct the shoulder much more than to      about 80 degrees currently.  I will see her back after the scan.      We will give her a call if there is anything we need to address      sooner.   No medication were written out today.          ______________________________  Brantley Stage, M.D.    DMK/MedQ  D:  05/12/2008 14:30:05  T:  05/13/2008 05:54:10  Job #:  [id number]   cc:   Dr. Tanya Nones
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
## 2011-01-21 NOTE — H&P (Signed)
NAME:  Lisa Ewing, Lisa Ewing                ACCOUNT NO.:  [id number]   MEDICAL RECORD NO.:  [id number]          PATIENT TYPE:  OUT   LOCATION:  RAD                           FACILITY:  [facility]   PHYSICIAN:  Guy Sandifer. Henderson Cloud, M.D. DATE OF BIRTH:  04-24-1956   DATE OF ADMISSION:  07/14/2008  DATE OF DISCHARGE:  07/14/2008                              HISTORY & PHYSICAL   CHIEF COMPLAINT:  Left lower quadrant pain.   HISTORY OF PRESENT ILLNESS:  The patient is a 55 year old black female  G1, P1 status post Essure tubal ligation, who has increasingly severe  left lower quadrant pain.  It starts about 2 weeks or so after each  menses.  The menses themselves have some mild cramping right before her  flows.  She was recently treated for diverticulitis in May of this year  by Dr. Cliffton Asters.  However, this pain as cyclic and feels distinctly  different than her episode of diverticulitis.  Ultrasound in my office  on July 12, 2008, revealed a uterus measuring 7.6 x 4.0 x 4.4 cm.  The ovaries appear normal.  Essure coils are noted bilaterally.  After  discussion of options of management, she is being admitted for  laparoscopy.  Potential risks and complications have been reviewed  preoperatively.   PAST MEDICAL HISTORY:  1. Migraine headaches.  2. Diverticulitis.  3. Arthritis.  4. Chronic hypertension.   PAST SURGICAL HISTORY:  Hammertoe surgery and Essure tubal ligation.   MEDICATIONS:  Blood pressure medicine and medicine for sleep p.r.n.  The  patient cannot name the type.   ALLERGIES:  No known drug allergies.   SOCIAL HISTORY:  Consumes alcohol on a social basis.  Denies tobacco or  drug abuse.   FAMILY HISTORY:  Positive for diabetes, hypertension, and colon cancer.   REVIEW OF SYSTEMS:  NEURO:  History of headache as above.  CARDIAC:  Denies chest pain.  PULMONARY:  Denies shortness of breath.   PHYSICAL EXAMINATION:  VITAL SIGNS:  Height 5 feet 4-1/2 inches, blood  pressure 120/84.  HEENT:  Without thyromegaly.  LUNGS:  Clear to auscultation.  HEART:  Regular rate and rhythm.  BACK:  No CVA tenderness.  ABDOMEN:  Soft, nontender without masses.  PELVIC:  Vulva, vagina, and cervix without lesion.  Uterus normal size,  mobile, nontender.  Adnexa nontender without masses.  EXTREMITIES:  Grossly within normal limits.  NEUROLOGIC:  Grossly within normal limits.   ASSESSMENT:  Cyclic left lower quadrant pain.   PLAN:  Laparoscopy.      Guy Sandifer Henderson Cloud, M.D.  Electronically Signed     JET/MEDQ  D:  07/19/2008  T:  07/20/2008  Job:  [id number]

## 2011-01-21 NOTE — Assessment & Plan Note (Signed)
[name] is a 55 year old divorced woman who is followed in our  pain and rehabilitative clinic, and is here for refill of her  medications today.   Lisa Ewing has a very complicated pain history and has multiple pain  complaints.  She has bilateral shoulder pain, thoracic pain, low back  pain, as well as bilateral pelvic and hip pain which radiates  predominantly down to the right lower extremities.  She has bilateral  upper extremity pain and numbness.   She was last seen by me on September 15, 2008.  In the interim, she was  trialed on Neurontin.  She has seen Dr. Merlyn Ewing who evaluated her left  hand.  There is a referral note from Dr. Merlyn Ewing which indicates that [name] does have a small nodule in the palmar fascia of her middle finger  on the left.  It was felt that this is probably a fibroma.  He is not  recommending any particular treatment other than splints, both hard and  soft, and using paraffin wax bath as well as glucosamine and chondroitin  sulfate.  Apparently, she does have a followup appointment with him in  the next month.   She also saw Dr. Charlann Ewing; however, I do not have a consult note from him at  this point yet.   Lisa Ewing also has a diagnosis of fibromyalgia and basically has  widespread pain.   She carries a diagnosis of previous cervical carcinoma and a whole body  bone scan was obtained on September 29, 2008, which showed no evidence of  bony metastasis and some probable degenerative changes in her feet and  ankles.  This was reviewed with her as well today.   She states her average pain is about 10 on a scale of 10, it is about 9  in clinic at this time.  Pain is described as constant, sharp, burning,  stabbing, tingling, and aching in nature.  Sleep tends to be poor.  Pain  is worse with most activities, improves with rest, heat, medication, and  TENS unit.  She reports fair relief with current meds.   MEDICATIONS:  Prescribed through this clinic  include Vicodin 10/500 up  to three times a day and Neurontin 300 mg four times a day; however, she  states she is only currently taking it three times a day.   FUNCTIONAL STATUS:  She is unable to walk.  She is able to transfer  independently from wheelchair to bed or wheelchair to toilet.  She does  not climb stairs.  She does not drive.  She requires assistance with  dressing, bathing, toileting, meal prep, household duties, and shopping.  She is independent with feeding.   She reports some difficulty with bladder control, numbness, tingling,  spasms, dizziness, depression, anxiety.  Denies suicidal ideation.  Denies harm to self or others.   REVIEW OF SYSTEMS:  Positive for constipation, poor appetite, limb  swelling, sleep apnea, and wheezing.   There have been no medical problems over the last month for her.  She  indicates no changes in her social or family history.   PHYSICAL EXAMINATION:  VITAL SIGNS:  Today, blood pressure is 141/73,  pulse 99, respiration 18, 94% saturated on room air.  GENERAL:  She is an obese female who appears her stated age and does not  appear in any distress.  She is oriented x3 and her speech is clear.  Her affect is bright.  She is alert, cooperative,  and pleasant.  She  follows commands without difficulty and answers questions appropriately.  She states she cannot remember exactly what Dr. Charlann Ewing told her, but she  does remember what Dr. Merlyn Ewing told her.   Her Cranial nerves are grossly intact and her coordination is intact as  well.  Her reflexes are diminished in both lower extremities and 2+ at  the biceps, triceps, brachioradialis bilaterally.  Sensation was not  examined today.  Her motor strength however is in the 4+/5 range in the  upper extremities at shoulder abductors, biceps, triceps,  brachioradialis, and finger flexors as well as intrinsic.  She has  difficulty lifting her legs.  Her strength in her legs is in 2-3 range  at hip  flexors, knee extensors, dorsi and plantar flexors.   She states that she cannot give effort because of increased pain in the  legs when she performs these maneuvers.   She does not have any point tenderness with palpation down her spine.  She does have multiple areas of tenderness in the paraspinal musculature  and in cervical, thoracic, and lumbar regions.  She does have an  intermittent tremor as well.  No clonus is noted.  Toes are downgoing.   IMPRESSION:  1. History of lumbar laminectomy 1995, L5-S1.  2. Right paracentral disk protrusion at L5-S1, possibly effecting S1      nerve root.  3. L4-5 paracentral disk protrusion, possibly effecting left L5 root.  4. Facet arthropathy.  5. History of fibromyalgia.  6. Left shoulder acromioclavicular joint arthritis.  7. Cervical spondylosis, C5-6 and C6-7.  8. Right epiphyseal injury remotely with a small effusion on the      right.  9. Difficulty standing maybe secondary to deconditioning and pain in      low back and hip.  10.History of left hand neuroma per Dr. Merlyn Ewing.   PLAN:  We will refill her Vicodin 10/500 one p.o. t.i.d. #90, no  refills.  We will continue her on Neurontin 300 mg one p.o. q.i.d.  We  will see her back in a month.  We will have to possibly increase her  Neurontin slightly next month.  Continue close followup with PCP for  other medical problems, may consider epidural injections possibly medial  branch block.  We will discuss further at next visit, also await Dr.  Nilsa Ewing notes in consultation.           ______________________________  Brantley Stage, M.D.     DMK/MedQ  D:  11/01/2008 14:01:12  T:  11/02/2008 02:03:34  Job #:  [id number]

## 2011-01-21 NOTE — Assessment & Plan Note (Signed)
ADDRESS:  [name] is a 55 year old divorced woman who is  accompanied by her daughter who is a patient of Dr. Tanya Nones.   Lisa Ewing is being followed in our [facility] Clinic.  She  has a very complicated pain history and has multiple pain complaints.  She has bilateral shoulder pain, thoracic, back pain, lumbar pain, as  well as bilateral pelvic and hip pain radiating down predominantly the  right leg and bilateral upper extremity pain and numbness.   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  She is also status  post a lumbar laminectomy and is known to have lumbar spondylosis, as  well as cervical and thoracic degenerative changes.   She missed her last appointment and is in today for recheck and refill  of her medications.  She states her average pain is about an 8-9 on the  scale of 10 and pain significantly interferes with activity, especially  right hip pain inhibits ambulation at this point.   Pain is typically worse with walking, standing, prolonged sitting,  improves with being supine and at rest.   She reports fair relief with current medications.   Medications which are provided through this clinic include Vicodin  10/500 up to 3 times a day on a p.r.n. basis.   She has multiple medication reactions; she has been on LYRICA before and  reports history of swelling, PERCOCET gives her hives, PENICILLIN causes  throat swelling, CODEINE causes stomach aches, SULFA causes tongue to  swell, also has SHELLFISH allergy.   FUNCTIONAL STATUS:  She is nonambulatory at this point.  She uses a  wheelchair for mobility.   She is independent with feeding and toileting, requires assistance with  dressing, bathing, meal prep, and household duties.   REVIEW OF SYSTEMS:  Positive for bladder control problems, lower  extremity weakness especially on the right; history of depression,  anxiety, denies suicidal ideation.  She also has complaints of nausea,  diarrhea, poor appetite, wheezing, these complaints are followed by Dr.  Tanya Nones.   Since her last visit with [place] on July 07, 2008, she reports that she  has had severe respiratory infection and has been on antibiotics and  steroids.   PHYSICAL EXAMINATION:  VITAL SIGNS:  Her blood pressure is 137/75, pulse  100, respirations 20, 95% saturated on room air.  GENERAL:  She is a well-developed, morbidly obese woman who does not  appear in any distress.  She is oriented x3.  Speech is clear.  Her  affect is bright.  She is alert, cooperative, and pleasant.  She follows  commands without difficulty and answers questions appropriately.   Her cranial nerves are grossly intact, as well as her coordination.   She has mild limitations in cervical range of motion in all planes.  Her  shoulder range of motion is limited to approximately 90 degrees  bilaterally with some mild diminishment in internal and external  rotation bilaterally.  She has tenderness especially over the  acromioclavicular joint on the left.   She has tenderness throughout multiple areas in the cervical paraspinal  musculature intrascapularly down the thoracic spine, as well as into the  lumbar spine and pelvis.   Her motor strength is 4+/5 in the upper extremities without focal  deficits.  Her lower extremity strength on the left is in the  4+/5  range, right she is unable to give me good effort secondary to hip pain  with hip flexion, extension, or knee extension distally.  She has 4+/5  strength at dorsiflexors and plantar flexors.   She reports a decreased sensation over the right thumb, ring, and little  finger, and also over the plantar surface of the right foot.   She does have an intermittent tremor, otherwise no abnormal tone is  noted.  No clonus is noted and toes are  downgoing.   Review of the lumbar MRI without contrast with her and showed broad-  based central annular disk bulge at L4-5 up to the contact with left L4  root.  At L5, a right S1 paracentral protrusion with possible  encroachment of the right S1 root noted, and asymmetric prominence of  the right S1 root suggestive of edema.   Bilateral shoulder x-rays done July 14, 2008, showed postoperative  changes about the right shoulder and acromioclavicular degenerative  disease of the left shoulder.   IMPRESSION:  1. History of fibromyalgia.  2. Left shoulder acromioclavicular joint osteoarthritis.  3. Cervical spondylosis C5-6, C6-7.  4. Right hip epiphyseal injury remotely with diaphysis.  5. Right lower extremity weakness may be secondary to hip joint      pathology, however, may consider elective diagnostic studies for      further evaluation.  6. History of right upper extremity numbness and tingling.  Again, we      may consider further evaluation with EMG and conduction studies.  7. History of left hand flexor tendon swelling.  8. Multiple spinal tenderness throughout the thoracic lumbar and hips      with a history of previous cervical and uterine cancer and      precancerous laryngeal findings.  We will discuss further with      primary care physician to determine whether further evaluation is      indicated.   PLAN:  1. Follow up with Dr. Merlyn Lot for flexor tendon swelling.  2. Follow up with Dr. Charlann Boxer for further evaluation of slipped femoral      epiphysis.  3. She will follow up with PCP regarding history of cancer.  4. Consider elective diagnostic studies of right upper extremity and      right lower extremity.  5. A trial of TENS unit.  6. Trial of Neurontin, the patient has been on Lyrica in the past and      has had some swelling, did discuss side effects profile with      Neurontin.  The patient would like to trial that.  7. May consider repeat lumbar epidural in  the future.  Her hydrocodone      10/500 three times a day on a p.r.n. basis was also refilled to her      today.           ______________________________  Brantley Stage, M.D.     DMK/MedQ  D:  09/18/2008 08:24:26  T:  09/18/2008 22:42:37  Job #:  [id number]

## 2011-01-21 NOTE — Procedures (Signed)
NAME:  Lisa Ewing, Lisa Ewing                ACCOUNT NO.:  [id number]   MEDICAL RECORD NO.:  [id number]         PATIENT TYPE:  POUT   LOCATION:  OUT                           FACILITY:  [facility]   PHYSICIAN:  Kofi A. Gerilyn Pilgrim, M.D. DATE OF BIRTH:  [birth year]   DATE OF PROCEDURE:  DATE OF DISCHARGE:                             SLEEP DISORDER REPORT   REFERRING PHYSICIAN:  Broadus John T. Pamalee Leyden, MD   INDICATIONS:  This is a 52-year lady who has a known history of  obstructive sleep apnea syndrome.  This is a CPAP titration study.  Epworth sleepiness scale is 16.   MEDICATIONS:  None listed.  BMI 36.   ARCHITECTURAL SUMMARY:  This is a CPAP titration study.  The total  recording time is 368 minutes.  Sleep efficiency 92%.  Sleep latency 27  minutes.  REM latency 111 minutes.  Stage N1 1%, N2 80%, N3 59%, and REM  sleep 22.   RESPIRATORY SUMMARY:  The patient was titrated between pressures of 5  and 10.  The optimal pressure is 9 with resolution events and good  tolerance.  The baseline saturation is 91% and lowest saturation 82%.  The low saturation was at the early part of titration.   LIMB MOVEMENT SUMMARY:  The PLM index is 12.   ELECTROCARDIOGRAM SUMMARY:  Average heart rate 82 with no significant  arrhythmias observed.   IMPRESSION:  Obstructive sleep apnea syndrome which responds well to a  continuous positive airway pressure of 9.      Kofi A. Gerilyn Pilgrim, M.D.  Electronically Signed     KAD/MEDQ  D:  10/06/2008  T:  10/07/2008  Job:  [id number]

## 2011-02-05 NOTE — Assessment & Plan Note (Signed)
[name] is a pleasant 55 year old essentially wheelchair-bound woman who was last seen by me on October 09, 2010.  She is back in today for a refill of her pain medication.  Lisa Ewing has been treated here at the [facility] for multiple pain complaints including chronic right hip pain.  She has a history of some right hip dysplasia with worsening on x-rays recently.  She is followed up with Dr. Cornelius Moras who is planning to do hip replacement on that side for her on December 31, 2010.  She has been cleared apparently by her primary care physician, Dr. Tanya Nones.  She also has intermittent problems with rotator cuff tendonitis.  She has a long history of fibromyalgia.  She has intermittent trochanteric bursitis and chronic right leg pain which is consistent with sciatic- type symptoms.  Her average pain is about 8 on a scale of 10, worse with activities that involves moving the leg.  Pain is worse with walking, bending, sitting, and standing, improves with rest, heat, TENS units.  She reports fair relief with current meds.  She is essentially wheelchair bound currently.  She is able to do standing transfers.  She does not drive.  She requires assistance with dressing, bathing, meal prep, household duties.  She is independent with feeding and toileting.  She reports intermittent problems with bladder control.  She has full bowel control; however, she reports weakness and numbness in the right lower extremity.  Admits to depression, anxiety, but denies suicidal ideation.  Past medical, social, family history are otherwise unchanged.  Medications prescribed through [facility] include Vicodin 10/500 three to four times a day; gabapentin 300 mg one at 9 a.m., 1 p.m., 5 p.m., 5 a.m., and two at 11 p.m. and 1 a.m.  She also uses trazodone 100 mg at night.  PHYSICAL EXAMINATION:  Blood pressure is 143/60, pulse 107, respiration 18, 92% saturated on room air.  She is an obese woman  who does not appear in any distress.  She is oriented x3.  Speech is clear.  Affect is bright.  She is alert, cooperative, and pleasant.  Follows commands without difficulty.  Answers my questions appropriately.  Cranial nerves and coordination are intact.  Her reflexes are diminished in both lower extremities.  She has 1+ reflexes in the upper extremities at biceps, triceps, brachioradialis.  The left lower extremity is in the 5/5 range without obvious focal deficit.  At right lower extremity, she has weakness with hip flexion, knee extension, as well as dorsi and plantar flexion.  She has difficulty giving full effort due to increased pain at the hip and in the leg when she attempts movement.  She has decreased sensation especially on the lateral aspect of the right leg as well as the medial aspect below the knee.  IMPRESSION: 1. Chronic right hip pain with history of hip dysplasia.  Hip     injection did provide some relief but not persistent relief.  She     is followed up with Dr. Cornelius Moras who is planning right total hip     placement on December 31, 2010. 2. Right chronic leg symptoms past the knee as well with history of     right chronic S1 root compression. 3. Long history of fibromyalgia. 4. Intermittent history of trochanteric bursitis.  Her medical problems include history of uterine cancer and cervical cancer for which she is followed by Dr. Tanya Nones.  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  She maintains contact with Dr. Tanya Nones, her primary care physician, for these problems as well.  I refilled her Vicodin 10/500 two to four times a day for not more than 90 pills per month.  Also refilled trazodone 100 mg one p.o. at bedtime as well as gabapentin one p.o. q.0900, 1300, 1700, 0500, two at 2100 and 0100.  She is currently comfortable with our plan.  She will be following  up with our physician assistant, Lauree Chandler, next month for refill of her pain medications.  I anticipate a 2-week supply to be written out for at that time.  She has been taking her medications as prescribed. No evidence of aberrant behavior observed, and she reports overall fair to good relief with current meds.     Brantley Stage, M.D. Electronically Signed    DMK/MedQ D:  11/18/2010 13:57:29  T:  11/18/2010 22:16:27  Job #:  [id number]

## 2011-02-10 ENCOUNTER — Encounter: Payer: Medicaid Other | Attending: Neurosurgery | Admitting: Neurosurgery

## 2011-02-10 DIAGNOSIS — R0602 Shortness of breath: Secondary | ICD-10-CM | POA: Insufficient documentation

## 2011-02-10 DIAGNOSIS — R52 Pain, unspecified: Secondary | ICD-10-CM | POA: Insufficient documentation

## 2011-02-10 DIAGNOSIS — M79609 Pain in unspecified limb: Secondary | ICD-10-CM | POA: Insufficient documentation

## 2011-02-10 DIAGNOSIS — Z96649 Presence of unspecified artificial hip joint: Secondary | ICD-10-CM | POA: Insufficient documentation

## 2011-02-10 DIAGNOSIS — M1610 Unilateral primary osteoarthritis, unspecified hip: Secondary | ICD-10-CM

## 2011-02-10 DIAGNOSIS — R209 Unspecified disturbances of skin sensation: Secondary | ICD-10-CM | POA: Insufficient documentation

## 2011-02-11 NOTE — Assessment & Plan Note (Signed)
Lisa Ewing is here today for followup of her pain medicine and right leg pain.  She did have an anterior approach total hip arthroplasty with Dr. Durene Romans a little over 6 weeks ago.  She states the pain has been somewhat worse since that time, but she feels like it is more surgical than anything.  She rates her pain at about an 8 or 9.  It is constant, stabbing and she has used all of her pain medicines that he gave her and she had none left from the clinic.  She came in today for refill until she sees Dr. Pamelia Hoit on the 18th.  REVIEW OF SYSTEMS:  Notable for difficulties described above as well as some fever, chills, night sweats, nausea, and limb swelling.  She has some shortness of breath.  She uses a CPAP.  She has bladder control issues as well as some paresthesias, depression, and anxiety.  PAST MEDICAL HISTORY:  Unchanged.  SOCIAL HISTORY:  Unchanged.  PHYSICAL EXAMINATION:  VITAL SIGNS:  Blood pressure 114/79, pulse 88, respirations 24, and O2 sats 91 on room air. NEUROLOGIC:  Her motor strength is weak in lower extremities.  She is alert today.  Her affect is bright and alert. CONSTITUTIONAL:  She is constitutionally obese.  IMPRESSION: 1. Status post right anterior approach hip arthroplasty with Dr.     Durene Romans at [facility] approximately 6 weeks. 2. Generalized pain syndrome.  PLAN:  I went ahead and refilled her hydrocodone 10/500 one p.o. q.3-4 times a day, I gave her #45 with no refill.  This is enough to get her through until she sees Dr. Pamelia Hoit on the 18th.  Her questions were encouraged and answered     Lisa Ewing L. Lisa Ewing Electronically Signed    RLW/MedQ D:  02/10/2011 13:00:45  T:  02/11/2011 01:57:22  Job #:  [id number]

## 2011-02-24 ENCOUNTER — Ambulatory Visit: Payer: Medicaid Other | Admitting: Physical Medicine and Rehabilitation

## 2011-02-24 ENCOUNTER — Encounter: Payer: Medicaid Other | Attending: Neurosurgery | Admitting: Neurosurgery

## 2011-02-24 DIAGNOSIS — M549 Dorsalgia, unspecified: Secondary | ICD-10-CM | POA: Insufficient documentation

## 2011-02-24 DIAGNOSIS — M25559 Pain in unspecified hip: Secondary | ICD-10-CM | POA: Insufficient documentation

## 2011-02-24 DIAGNOSIS — M1610 Unilateral primary osteoarthritis, unspecified hip: Secondary | ICD-10-CM

## 2011-02-24 DIAGNOSIS — R42 Dizziness and giddiness: Secondary | ICD-10-CM | POA: Insufficient documentation

## 2011-02-24 DIAGNOSIS — M545 Low back pain: Secondary | ICD-10-CM

## 2011-02-24 DIAGNOSIS — M62838 Other muscle spasm: Secondary | ICD-10-CM | POA: Insufficient documentation

## 2011-02-24 DIAGNOSIS — G894 Chronic pain syndrome: Secondary | ICD-10-CM

## 2011-02-25 NOTE — Assessment & Plan Note (Signed)
Lisa Ewing is followed by Dr. Pamelia Hoit for hip and back pain.  She has undergone a total right hip anterior arthroplasty by Dr. Charlann Boxer at [facility] and is followed here today for pain control.  She basically called Dr. Pamelia Hoit office that she needed medicine refill.  She rates her pain at an 8.  Activity level is unchanged from previous.  She uses a walker or her scooter right now to get around.  REVIEW OF SYSTEMS:  Notable for difficulties with her hip and knee on the right.  She also has some spasms, dizziness, depression, confusion, and anxiety from time to time.  No suicidal thoughts or signs of aberrant behavior.  PHYSICAL EXAMINATION:  VITAL SIGNS:  Blood pressure is 145/78, pulse 95, respirations are 18, and O2 sats 92% on room air. VITAL SIGNS:  Motor strength, weak on the right side and the lower extremity.  She cannot really fire her quad very well and Dr. Charlann Boxer is working with her on that.  Otherwise, her affect is bright and alert. She is oriented x3.  She is going to continue to follow with him regarding her knee.  She fell when she was in the hospital having her hip done and they think that she may have torn the ligament in her knee and she has been investing now.  ASSESSMENT: 1. Chronic pain syndrome. 2. Status post right anterior hip arthroplasty. 3. Right knee questionable ligamentous injury.  PLAN:  She will follow up with Dr. Pamelia Hoit on or about March 07, 2011, as scheduled.  I will go ahead and refill her Vicodin 10/500 one p.o. t.i.d., I gave her 35 to bridge until she can get back and see Dr. Pamelia Hoit.  Questions were encouraged and answered.     Lisa Ewing Electronically Signed    RLW/MedQ D:  02/24/2011 13:07:56  T:  02/25/2011 03:03:58  Job #:  [id number]

## 2011-03-07 ENCOUNTER — Encounter
Payer: Medicaid Other | Attending: Physical Medicine and Rehabilitation | Admitting: Physical Medicine and Rehabilitation

## 2011-03-07 ENCOUNTER — Ambulatory Visit: Payer: Medicaid Other | Admitting: Physical Medicine and Rehabilitation

## 2011-03-07 DIAGNOSIS — G5600 Carpal tunnel syndrome, unspecified upper limb: Secondary | ICD-10-CM | POA: Insufficient documentation

## 2011-03-07 DIAGNOSIS — M79609 Pain in unspecified limb: Secondary | ICD-10-CM

## 2011-03-07 DIAGNOSIS — M25559 Pain in unspecified hip: Secondary | ICD-10-CM | POA: Insufficient documentation

## 2011-03-07 DIAGNOSIS — IMO0001 Reserved for inherently not codable concepts without codable children: Secondary | ICD-10-CM | POA: Insufficient documentation

## 2011-03-07 DIAGNOSIS — Z96649 Presence of unspecified artificial hip joint: Secondary | ICD-10-CM | POA: Insufficient documentation

## 2011-03-07 DIAGNOSIS — M545 Low back pain, unspecified: Secondary | ICD-10-CM

## 2011-03-07 DIAGNOSIS — M549 Dorsalgia, unspecified: Secondary | ICD-10-CM | POA: Insufficient documentation

## 2011-03-07 DIAGNOSIS — M76899 Other specified enthesopathies of unspecified lower limb, excluding foot: Secondary | ICD-10-CM | POA: Insufficient documentation

## 2011-03-07 DIAGNOSIS — IMO0002 Reserved for concepts with insufficient information to code with codable children: Secondary | ICD-10-CM

## 2011-03-07 NOTE — Assessment & Plan Note (Signed)
[name] is a pleasant 55 year old woman who is followed at [facility].  She has been followed for several years for chronic pain complaints, which are related to her back, her right leg, and her right hip.  She has recently undergone a total hip arthroplasty by Dr. Charlann Boxer on April 24.  The hospital visit is reviewed on e-chart.  She tells me she had an injury to her right knee postoperatively as she was getting back into bed and she twisted it. There is some concern regarding ligamentous injury and she tells me Dr. Charlann Boxer has ordered an MRI scan.  Results are not available on e-chart.  With respect to pain, she reports that her hip pain has gone from about 10 and down to 4.  She is also known to have some right lower extremity sciatic symptoms.  She tells me her leg pain which radiates from her back throughout her right lower extremity to the foot is still present.  Despite this, her function has improved significantly since her surgery. Prior to her surgery, she was able to transfer, but spent a very little time in the standing position, not more than a few seconds.  After surgery, she tells me she is now up walking 10-15 minutes with a walker. She is able to weightbear through the right lower extremity.  She has been participating in physical therapy.  She is followed up with Dr. Charlann Boxer.  She tells me that he has okayed her to begin some pool therapy in the upcoming weeks as well.  She does have a follow-up appointment with him in July now.  She believes the date is July 19.  She is very pleased with the results of her hip replacement surgery. She states her pain is better and she is able to stand and using a walker, walk up to 10 minutes.  With respect to review of systems, she still has some bladder control issues, numbness, tremors, trouble walking, spasms, tingling, confusion, depression, anxiety.  Denies suicidal ideation.  She maintains  contact with Dr. Tanya Nones for her other medical problems.  No other changes in social or family history.  MEDICATIONS:  [facility]: 1. Gabapentin 300 mg one p.o. q. 9 a.m., 1 p.m., 5 p.m., 5 a.m., two     at 11 p.m. and 1 a.m. 2. Vicodin 10/500 one p.o. t.i.d. 3. Trazodone 100 mg daily.  PHYSICAL EXAMINATION:  VITAL SIGNS:  Her blood pressure is 126/64, pulse 92, respirations 18, 94% saturated on room air. GENERAL:  She is well-developed obese woman, who does not appear in any distress.  She is oriented x3.  Speech is clear.  Affect is bright.  She is alert, cooperative, and pleasant.  Follows commands without difficulty.  Answers my questions appropriately.  She rides in her scooter chair. NEUROLOGIC:  Cranial nerves and coordination are intact.  Reflexes are diminished in the right lower extremity, diminished at the left patellar tendon, 1+ at the left Achilles tendon.  No abnormal tone, clonus, or tremors noted.  Motor strength is less than antigravity in right hip flexors.  Knee extension is 4/5.  Dorsi and plantar flexion are in the 4+/5 range on the right.  Left lower extremity is 5/5 at hip flexion, knee extension, dorsiflexion, plantar flexion, and knee flexion.  She has diminished sensation in the right lower extremity especially the lateral aspect of her right leg.  She transitioned from sitting to standing independently  and with a walker is able to ambulate within the room and out in the hall today. She appears quite stable using a walker.  She does complain of some wrist and hand pain holding on to the walker.  IMPRESSION: 1. Status post right total hip arthroplasty per Dr. Charlann Boxer, April 24. 2. History of knee injury, awaiting MRI per Dr. Charlann Boxer as well. 3. Chronic bilateral hand and wrist pain, history of carpal tunnel,     followed by Dr. Merlyn Lot.  Also, has some degenerative changes in the     carpal-metacarpal joint in the hands. 4.  Chronic right S1 radiculopathy. 5. Long history of fibromyalgia. 6. Intermittent trochanteric bursitis.  MEDICAL PROBLEMS:  Include, history of uterine cancer as well as cervical cancer followed by Dr. Tanya Nones.  She also has history of thyroid gland removal and is on thyroid replacement.  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  She has a smoking history.  She had some difficulty getting off of the ventilator, status post her hip replacement.  I have encouraged her to stop smoking.  Continue her postoperative exercise program per Dr. Charlann Boxer.  I will refill her gabapentin and adjust her doses today.  We will decrease her dose today to 300 mg one p.o. b.i.d. and two at bedtime.  I will refill her Vicodin 10/325 up to three times a day #90.  I have given her a prescription for platform walker to take the stress off of her hands during ambulation.  We will continue trazodone.  I have encouraged her to participate in pool program if this is okay per her Orthopedic surgeon.  She does have a follow-up appoint with him on July 19.  She continues to have some weakness especially around the right hip.  She is still very deconditioned from being wheelchair bound for 4 years.  Overall, she has made significant gain in her function with this hip replacement and is now able to be up walking about 10 minutes.  She is very pleased about this aspect of improved function as well as diminished pain around the right hip.  She still continues to have some problem with sciatic type pain, radiating down the right leg, but overall her function has improved significantly.  I will see her back in 2 months.  She takes her medications as prescribed. Last urine drug screen was collected on January 2 was consistent.  We will have nurse practitioner, Kallie Edward, follow her back up in 1 month.  I will see her back in 2 months.  I have answered all her questions.     Brantley Stage,  M.D. Electronically Signed    DMK/MedQ D:  03/07/2011 09:35:25  T:  03/07/2011 21:59:33  Job #:  [id number]

## 2011-04-04 ENCOUNTER — Encounter: Payer: Medicaid Other | Attending: Neurosurgery | Admitting: Neurosurgery

## 2011-04-04 DIAGNOSIS — Z96649 Presence of unspecified artificial hip joint: Secondary | ICD-10-CM | POA: Insufficient documentation

## 2011-04-04 DIAGNOSIS — R0602 Shortness of breath: Secondary | ICD-10-CM | POA: Insufficient documentation

## 2011-04-04 DIAGNOSIS — M79609 Pain in unspecified limb: Secondary | ICD-10-CM | POA: Insufficient documentation

## 2011-04-04 DIAGNOSIS — M25559 Pain in unspecified hip: Secondary | ICD-10-CM

## 2011-04-04 DIAGNOSIS — R209 Unspecified disturbances of skin sensation: Secondary | ICD-10-CM | POA: Insufficient documentation

## 2011-04-04 DIAGNOSIS — R52 Pain, unspecified: Secondary | ICD-10-CM | POA: Insufficient documentation

## 2011-04-04 DIAGNOSIS — M5430 Sciatica, unspecified side: Secondary | ICD-10-CM

## 2011-04-05 NOTE — Assessment & Plan Note (Signed)
Lisa Ewing is a patient of Dr. Pamelia Hoit.  She is followed for several years for chronic pain complaints, hurting her back, leg and hip.  She did have an arthroplasty with Dr. Charlann Boxer and is doing well from that. Today, she rates her pain at about an 8 to sharp, burning-type pain that is constant.  Activity level is 8-10.  Pain is same 24 hours a day. Sleep patterns are poor but all activities aggravate her pain.  Rest, medication, TENS unit and ice tend to help.  She uses a walker.  She is in a wheelchair today for this visit.  She cannot climb steps or drive. Functionally, she needs help with her ADLs at the current time.  REVIEW OF SYSTEMS:  Notable for those difficulties described above as well as some bladder control issues, weakness, spasms, confusion, depression, anxiety.  No suicidal thoughts or aberrant behaviors.  PAST MEDICAL HISTORY:  Unchanged.  SOCIAL HISTORY:  Unchanged.  FAMILY HISTORY:  Unchanged.  Physical exam; her blood pressure is 136/75, pulse 90, respirations 20, O2 sats 92 on room air.  Motor strength is good in the lower extremities.  She has some quad weakness on the affected side.  She is obese.  She is alert and oriented x3 and again she is in a wheelchair. Her sensation is intact in the upper and lower extremities.  ASSESSMENT: 1. Status post right hip arthroplasty with Dr. Charlann Boxer.  She will follow     up with him p.r.n. 2. History of right knee pain again treating with Dr. Charlann Boxer. 3. Chronic bilateral hand and wrist pain. 4. Chronic right 1 radiculopathy 5. Fibromyalgia.  PLAN: 1. Refill hydrocodone 10/325 one p.o. t.i.d. 90 with no refill. 2. She will follow up with Dr. Pamelia Hoit in 1 month.  She already has     an appointment scheduled.  Her questions were encouraged and     answered.     Netha Dafoe L. Blima Dessert Electronically Signed    RLW/MedQ D:  04/04/2011 14:24:23  T:  04/05/2011 00:12:03  Job #:  [id number]

## 2011-04-18 ENCOUNTER — Other Ambulatory Visit: Payer: Self-pay | Admitting: Internal Medicine

## 2011-04-22 ENCOUNTER — Encounter: Payer: Self-pay | Admitting: Gastroenterology

## 2011-05-02 ENCOUNTER — Encounter
Payer: Medicaid Other | Attending: Physical Medicine and Rehabilitation | Admitting: Physical Medicine and Rehabilitation

## 2011-05-02 DIAGNOSIS — S8990XA Unspecified injury of unspecified lower leg, initial encounter: Secondary | ICD-10-CM | POA: Insufficient documentation

## 2011-05-02 DIAGNOSIS — Z96649 Presence of unspecified artificial hip joint: Secondary | ICD-10-CM | POA: Insufficient documentation

## 2011-05-02 DIAGNOSIS — M25519 Pain in unspecified shoulder: Secondary | ICD-10-CM | POA: Insufficient documentation

## 2011-05-02 DIAGNOSIS — Z8544 Personal history of malignant neoplasm of other female genital organs: Secondary | ICD-10-CM | POA: Insufficient documentation

## 2011-05-02 DIAGNOSIS — Z8541 Personal history of malignant neoplasm of cervix uteri: Secondary | ICD-10-CM | POA: Insufficient documentation

## 2011-05-02 DIAGNOSIS — IMO0001 Reserved for inherently not codable concepts without codable children: Secondary | ICD-10-CM | POA: Insufficient documentation

## 2011-05-02 DIAGNOSIS — F17200 Nicotine dependence, unspecified, uncomplicated: Secondary | ICD-10-CM | POA: Insufficient documentation

## 2011-05-02 DIAGNOSIS — S99929A Unspecified injury of unspecified foot, initial encounter: Secondary | ICD-10-CM | POA: Insufficient documentation

## 2011-05-02 DIAGNOSIS — Y9210 Unspecified residential institution as the place of occurrence of the external cause: Secondary | ICD-10-CM | POA: Insufficient documentation

## 2011-05-02 DIAGNOSIS — M1710 Unilateral primary osteoarthritis, unspecified knee: Secondary | ICD-10-CM

## 2011-05-02 DIAGNOSIS — Z79899 Other long term (current) drug therapy: Secondary | ICD-10-CM | POA: Insufficient documentation

## 2011-05-02 DIAGNOSIS — M76899 Other specified enthesopathies of unspecified lower limb, excluding foot: Secondary | ICD-10-CM | POA: Insufficient documentation

## 2011-05-02 DIAGNOSIS — M79609 Pain in unspecified limb: Secondary | ICD-10-CM

## 2011-05-02 DIAGNOSIS — M545 Low back pain, unspecified: Secondary | ICD-10-CM

## 2011-05-02 DIAGNOSIS — W19XXXA Unspecified fall, initial encounter: Secondary | ICD-10-CM | POA: Insufficient documentation

## 2011-05-02 DIAGNOSIS — M542 Cervicalgia: Secondary | ICD-10-CM

## 2011-05-02 DIAGNOSIS — IMO0002 Reserved for concepts with insufficient information to code with codable children: Secondary | ICD-10-CM | POA: Insufficient documentation

## 2011-05-02 NOTE — Assessment & Plan Note (Signed)
[name] is a pleasant 55 year old woman who is [facility].  She has a history of a chronic right S1 radiculopathy and is status post right total hip replacement per Dr. Charlann Boxer on December 31, 2010.  While she was postoperative in the hospital, she had a fall injured her right knee.  Dr. Charlann Boxer is apparently working her up and apparently has done an MRI.  I do not have access to the results but [name] tells me that there is possible arthroscopic surgery planned in a couple of months.  She is back in today for refill of her pain medications.  She continues to use hydrocodone 10/325 up to three times a day.  She also uses trazodone and gabapentin.  She does not need refills on those medications.  Today, her pill counts are appropriate.  Average pain is about 8 on a scale of 10.  Sleep is fair.  She reports fair to good relief with the medication she is currently taking.  She has multiple pain complaints including some more recent cervicalgia which has been bothering over the last couple of months, since she has been up on the walker.  She does have some right knee pain which she is following up with Dr. Charlann Boxer for.  Her left shoulder has bothered her also since she has been using a walker.  She has been putting a lot more weight through her shoulders with her ambulation.  FUNCTIONAL STATUS:  She can walk 10 minutes at a time.  She is basically up off and on throughout the day with a walker in the house.  She is not using wheelchair in the house during the day.  At night, she does use a wheelchair to prevent falls.  She is independent with feeding and toileting.  She is doing some cooking now which is new for her over the last couple of months.  She is quite happy about that.  REVIEW OF SYSTEMS:  Otherwise unchanged from previous visit.  She denies suicidal ideation.  Does continue to have intermittent weakness, numbness, tremors,  tinging, trouble walking, spasms, dizziness, confusion, depression, anxiety, as well as some bladder issues on occasion also notes night sweats, changes in blood sugar as well as sleep apnea and wheezing.  I asked her to maintain contact with primary care for these issues.  On past medical, social, family history unchanged other than that already noted.  PHYSICAL EXAMINATION:  VITAL SIGNS:  On exam today, her blood pressure is 158/78, pulse 86, respirations 18, 94% saturated on room air. GENERAL:  She is an obese woman who does not appear in any distress. NEUROLOGIC:  She is oriented x3.  Speech is clear.  Affect is bright. She is alert, cooperative, and pleasant.  Follows commands without difficulty, answers my questions appropriately.  Cranial nerves and coordination are intact.  Her reflexes are diminished. MUSCULOSKELETAL:  Motor strength is good in both upper extremities.  She has 4+/5 strength at hip flexors, knee extensors on the right, dorsiflexion, and plantar flexion are in the 4+/5 range.  Left lower extremity 5/5 with hip flexion, knee extension, dorsiflexion, plantar flexion, and knee flexion.  Diminished sensation in the right lower extremity is unchanged.  She transitions from sitting to standing independently today.  She is able to walk independently in the room for about 4/5 steps and they will sit down safely.  She has some mild limitations in cervical range of motion, complains  of cervical extension.  Her shoulders, she has diminished abduction bilaterally to about 90 degrees.  Her left shoulder has significantly decreased internal rotation compared to the right external rotation is about 80 degrees.  IMPRESSION: 1. Status post right total hip arthroplasty by Dr. Charlann Boxer in December 31, 2010. 2. History of right knee injury currently followed by Dr. Charlann Boxer.     Apparently, MRI has been ordered an Dr. Charlann Boxer has planned     arthroscopic surgery in the upcoming  months.  I do not have any     details regarding this from him at this point. 3. Recent history of worsening left shoulder pain since she has been     up using the walker consistent with left rotator cuff     tendonitis/subacromial bursitis. 4. Chronic right S1 radiculopathy. 5. Long history of fibromyalgia. 6. Intermittent trochanteric bursitis.  Her medical problems are numerous and include history of uterine cancer as well as cervical cancer followed by Dr. Tanya Nones, history of thyroid gland removal and is on thyroid replacement, precancerous lesions in the larynx and pharynx and esophagus,  nicotine addiction.  PLAN:  We discussed proper body mechanics when she is up using her walker to avoid cervical hyperextension and limits stress on the posterior elements of her cervical spine will she is ambulating.  I have also written some more orders for the limited physical therapy on her left upper extremity is working on range of motion Thera-Band for scapular stabilization exercises to try to limit it to one therapy session if this is possible, and she has limited access to physical therapy with her insurance.  She is comfortable with our plan at this point, I asked her to maintain contact with primary care as well as her orthopedic surgeon.  I have answered all her all her questions.  I have refilled her pain medication.  She has been taking her medications as prescribed.  She has had no side effects.  She has been using some nonsteroidal anti-inflammatory medication.  She reports this did not help that much.  I reviewed the risks and benefits of this medication with her.  She is considering discontinuing it, and she reports pain control is fair to good.  Her last urine drug screen was consistent that was in January 2012.  I have answered all her questions.  She is comfortable with our plan.  I will see her back in a month.     Brantley Stage, M.D. Electronically  Signed    DMK/MedQ D:  05/02/2011 12:58:07  T:  05/02/2011 16:10:96  Job #:  [id number]

## 2011-05-14 ENCOUNTER — Ambulatory Visit (HOSPITAL_COMMUNITY): Payer: Medicaid Other | Admitting: Specialist

## 2011-05-26 ENCOUNTER — Other Ambulatory Visit: Payer: Self-pay | Admitting: Family Medicine

## 2011-05-26 DIAGNOSIS — Z139 Encounter for screening, unspecified: Secondary | ICD-10-CM

## 2011-05-27 ENCOUNTER — Ambulatory Visit (HOSPITAL_COMMUNITY): Payer: Medicaid Other | Admitting: Specialist

## 2011-05-30 ENCOUNTER — Encounter
Payer: Medicaid Other | Attending: Physical Medicine and Rehabilitation | Admitting: Physical Medicine and Rehabilitation

## 2011-05-30 DIAGNOSIS — Z79899 Other long term (current) drug therapy: Secondary | ICD-10-CM | POA: Insufficient documentation

## 2011-05-30 DIAGNOSIS — M25519 Pain in unspecified shoulder: Secondary | ICD-10-CM | POA: Insufficient documentation

## 2011-05-30 DIAGNOSIS — G8929 Other chronic pain: Secondary | ICD-10-CM | POA: Insufficient documentation

## 2011-05-30 DIAGNOSIS — M542 Cervicalgia: Secondary | ICD-10-CM | POA: Insufficient documentation

## 2011-05-30 DIAGNOSIS — J392 Other diseases of pharynx: Secondary | ICD-10-CM | POA: Insufficient documentation

## 2011-05-30 DIAGNOSIS — M545 Low back pain, unspecified: Secondary | ICD-10-CM

## 2011-05-30 DIAGNOSIS — Z8541 Personal history of malignant neoplasm of cervix uteri: Secondary | ICD-10-CM | POA: Insufficient documentation

## 2011-05-30 DIAGNOSIS — M79609 Pain in unspecified limb: Secondary | ICD-10-CM

## 2011-05-30 DIAGNOSIS — IMO0001 Reserved for inherently not codable concepts without codable children: Secondary | ICD-10-CM | POA: Insufficient documentation

## 2011-05-30 DIAGNOSIS — M76899 Other specified enthesopathies of unspecified lower limb, excluding foot: Secondary | ICD-10-CM | POA: Insufficient documentation

## 2011-05-30 DIAGNOSIS — Z8542 Personal history of malignant neoplasm of other parts of uterus: Secondary | ICD-10-CM | POA: Insufficient documentation

## 2011-05-30 DIAGNOSIS — Z87891 Personal history of nicotine dependence: Secondary | ICD-10-CM | POA: Insufficient documentation

## 2011-05-30 DIAGNOSIS — G894 Chronic pain syndrome: Secondary | ICD-10-CM

## 2011-05-30 DIAGNOSIS — IMO0002 Reserved for concepts with insufficient information to code with codable children: Secondary | ICD-10-CM | POA: Insufficient documentation

## 2011-05-30 DIAGNOSIS — Z96649 Presence of unspecified artificial hip joint: Secondary | ICD-10-CM | POA: Insufficient documentation

## 2011-05-30 NOTE — Assessment & Plan Note (Signed)
HISTORY OF PRESENT ILLNESS:  Lisa Ewing. Garr is a pleasant 55 year old woman who was seen last by me on May 02, 2011.  She is back in today for brief recheck and refill of her medications.  She is followed here at the [facility] for multiple chronic pain complaints which include chronic neck and right upper extremity pain as well as low back pain and right leg pain.  She underwent right total hip arthroplasty by Dr. Charlann Boxer on December 31, 2010 and has been recovering from this.  She is slowly been increasing her activity levels.  She can now walk 10-15 minutes at a time which is improved from last month.  Her average pain is about 8 on a scale of 10, predominately located in the low back, radiating down the right leg.  Pain is described as sharp, burning, stabbing, and aching in nature.  She reports fair relief with the current medications she is using.  She does note she has had several UTIs over the last several months. She tells me her primary care doctor is considering having her see a specialist for this.  She requires some assistance with dressing and bathing.  She is independent with feeding and toileting.  She can do some cooking.  REVIEW OF SYSTEMS:  Otherwise unchanged from previous visit.  She denies suicidal ideation.  She does admit to some bladder problems.  PAST MEDICAL/SOCIAL/FAMILY HISTORY:  Unchanged from previous visit.  MEDICATIONS:  [facility] through [facility] include: 1. Trazodone 100 mg 1 p.o. at bedtime. 2. Gabapentin 800 mg 1 p.o. b.i.d. and 2 at bedtime. 3. Hydrocodone 10/325 three times a day.  PHYSICAL EXAMINATION:  VITAL SIGNS:  Her blood pressure is 125/58, pulse 112, respirations 16, and 98% saturated on room air. NEUROLOGIC:  She is a well-developed obese woman who does not appear in any distress.  She is oriented x3.  Speech is clear.  Affect is bright. She is alert, cooperative, and pleasant.  Follows commands  without difficulty.  Answers my questions appropriately.  Cranial nerves to coordination are intact.  Reflexes are diminished in the lower extremities.  She has good strength in the left lower extremity and right lower extremity.  Hip flexion today is 3+ to 4-/5.  She has 4+/5 knee extensors on the right and dorsiflexion and plantar flexion are 4+/5.  Decreased sensation is noted in the right lower extremity.  This is unchanged from previous visit.  She did not bring her walker today, so I did not observe her ambulating in the room today.  IMPRESSION: 1. Status post right total hip arthroplasty, Dr. Charlann Boxer on December 31, 2010. 2. History of right knee injury, currently followed by Dr. Charlann Boxer.  She     has a followup appointment with him on July 03, 2011. 3. History of left shoulder pain, consistent with rotator cuff     tendonitis/acromial bursitis.  She wants to hold off on workup and     management of this while she is pursuing evaluation of her left     knee. 4. Chronic right S1 radiculopathy, stable. 5. Long history of fibromyalgia, stable. 6. History of intermittent trochanteric bursitis.  Her medical problems are numerous and include history of uterine cancer as well as cervical cancer.  She maintains contact with primary care, Dr. Tanya Nones.  She has a history of thyroid gland removal and is on thyroid replacement.  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  PLAN:  I refilled the following medications for her today, Norco 10/325 one p.o. t.i.d., gabapentin 300 mg 1 p.o. b.i.d. and 2 at bedtime, and trazodone 100 mg 1 p.o. at bedtime.  Her urine drug screen done on May 14, 2011 was consistent.  Her pill count is appropriate.  She displays no evidence of aberrant behavior.  She takes her medications as prescribed.  I have answered all her questions and see her back in 5 weeks.  I encouraged her to maintain  contact with her primary care and other medical specialists.     Brantley Stage, M.D. Electronically Signed    DMK/MedQ D:  05/30/2011 11:35:26  T:  05/30/2011 13:26:56  Job #:  [id number]

## 2011-06-09 ENCOUNTER — Ambulatory Visit (HOSPITAL_COMMUNITY)
Admission: RE | Admit: 2011-06-09 | Discharge: 2011-06-09 | Disposition: A | Discharge status: Home / Self Care | Payer: Medicaid Other | Source: Ambulatory Visit | Attending: Family Medicine | Admitting: Family Medicine

## 2011-06-09 ENCOUNTER — Other Ambulatory Visit: Payer: Self-pay | Admitting: Family Medicine

## 2011-06-09 DIAGNOSIS — M25519 Pain in unspecified shoulder: Secondary | ICD-10-CM | POA: Insufficient documentation

## 2011-06-09 DIAGNOSIS — R52 Pain, unspecified: Secondary | ICD-10-CM

## 2011-06-09 DIAGNOSIS — M25419 Effusion, unspecified shoulder: Secondary | ICD-10-CM | POA: Insufficient documentation

## 2011-06-10 ENCOUNTER — Other Ambulatory Visit (HOSPITAL_COMMUNITY): Payer: Self-pay | Admitting: Urology

## 2011-06-10 DIAGNOSIS — N390 Urinary tract infection, site not specified: Secondary | ICD-10-CM

## 2011-06-13 ENCOUNTER — Other Ambulatory Visit (HOSPITAL_COMMUNITY): Payer: Medicaid Other

## 2011-06-13 ENCOUNTER — Ambulatory Visit (HOSPITAL_COMMUNITY)
Admission: RE | Admit: 2011-06-13 | Discharge: 2011-06-13 | Disposition: A | Discharge status: Home / Self Care | Payer: Medicaid Other | Source: Ambulatory Visit | Attending: Urology | Admitting: Urology

## 2011-06-13 DIAGNOSIS — N390 Urinary tract infection, site not specified: Secondary | ICD-10-CM | POA: Insufficient documentation

## 2011-07-07 ENCOUNTER — Ambulatory Visit (HOSPITAL_COMMUNITY): Payer: Medicaid Other

## 2011-07-08 ENCOUNTER — Ambulatory Visit: Payer: Medicaid Other | Admitting: Orthopedic Surgery

## 2011-07-09 ENCOUNTER — Encounter
Payer: Medicaid Other | Attending: Physical Medicine and Rehabilitation | Admitting: Physical Medicine and Rehabilitation

## 2011-07-09 DIAGNOSIS — M25559 Pain in unspecified hip: Secondary | ICD-10-CM

## 2011-07-09 DIAGNOSIS — M533 Sacrococcygeal disorders, not elsewhere classified: Secondary | ICD-10-CM | POA: Insufficient documentation

## 2011-07-09 DIAGNOSIS — M25519 Pain in unspecified shoulder: Secondary | ICD-10-CM | POA: Insufficient documentation

## 2011-07-09 DIAGNOSIS — M542 Cervicalgia: Secondary | ICD-10-CM | POA: Insufficient documentation

## 2011-07-09 DIAGNOSIS — R229 Localized swelling, mass and lump, unspecified: Secondary | ICD-10-CM | POA: Insufficient documentation

## 2011-07-09 DIAGNOSIS — Z8542 Personal history of malignant neoplasm of other parts of uterus: Secondary | ICD-10-CM | POA: Insufficient documentation

## 2011-07-09 DIAGNOSIS — M76899 Other specified enthesopathies of unspecified lower limb, excluding foot: Secondary | ICD-10-CM | POA: Insufficient documentation

## 2011-07-09 DIAGNOSIS — M79609 Pain in unspecified limb: Secondary | ICD-10-CM

## 2011-07-09 DIAGNOSIS — M545 Low back pain, unspecified: Secondary | ICD-10-CM

## 2011-07-09 DIAGNOSIS — Z96649 Presence of unspecified artificial hip joint: Secondary | ICD-10-CM | POA: Insufficient documentation

## 2011-07-09 DIAGNOSIS — G894 Chronic pain syndrome: Secondary | ICD-10-CM

## 2011-07-09 DIAGNOSIS — Z8541 Personal history of malignant neoplasm of cervix uteri: Secondary | ICD-10-CM | POA: Insufficient documentation

## 2011-07-09 DIAGNOSIS — IMO0001 Reserved for inherently not codable concepts without codable children: Secondary | ICD-10-CM | POA: Insufficient documentation

## 2011-07-10 NOTE — Assessment & Plan Note (Signed)
Lisa Ewing is a pleasant 55 year old woman who was last seen by me on May 30, 2011.  She is back in today for recheck and the refill of her pain medications.  Lisa Ewing has a complicated orthopedic history.  She is status post a right total hip arthroplasty by Lisa Ewing on December 31, 2010.  She has had a recent knee injury and has been following up with Lisa Ewing as well for this.  She tells me today she had a fall and saw Lisa Ewing who has ordered an MRI, and feels she may have left rotator cuff tear.  She also has a known chronic right S1 radiculopathy, which has been stable and a long history of fibromyalgia, history of intermittent trochanteric bursitis.  She is back in today for refill of her medications.  She tells me she is in a bit of a hurry today.  She has a daughter who has a doctor's appointment right after seeing me today.  Her average pain is about 8 or 9 on a scale of 10.  Her pain is constant.  She complains of pain in her neck, her shoulders, her mid scapular area, low back down the right lower extremity and somewhat into the left lower extremity.  Pain is worse with walking, bending, sitting, standing, and improves with rest, modalities therapy, medications, and TENS unit.  She reports fair relief with current meds.  FUNCTIONAL STATUS:  She can walk about 15 minutes at a time.  She does not climb stairs more than 1 step.  She does not drive.  She is independent with feeding and toileting, needs assistance with dressing, bathing, meal prep.  She reports occasional problems with bladder, admits to weakness, numbness, tingling, trouble walking, spasms, dizziness, confusion, depression, anxiety.  Denies suicidal ideation.  REVIEW OF SYSTEMS:  Also positive for night sweats, blood sugar variations, nausea, constipation, limb swelling, coughing, shortness of breath, sleep apnea, and wheezing.  No changes in past medical, social, or family history since  previous visit.  PHYSICAL EXAMINATION:  Her blood pressure is 133/47, pulse 102, respirations 18, 90% saturated on room air.  She is an obese woman who does not appear in any distress.  She is oriented x3.  Speech is clear. Affect is bright.  She is alert, cooperative, and pleasant.  Follows commands without difficulty.  Answers my questions appropriately. Cranial nerves and coordination are intact.  Her reflexes are diminished in the lower extremities.  No abnormal tone, clonus, or tremors are noted.  Motor strength is generally in the 4+/5 range in both lower extremities.  She has decreased sensation in the right lower extremity, patchy in the left lower extremity.  She has tenderness over the trochanters.  She transitions from sitting to standing very slowly.  She is able to walk a couple of steps and climb up one step to get on the exam table today.  She has tenderness in the cervical lumbar region as well as in the parascapular region.  She has significantly diminished range of motion in the left shoulder, decreased abduction of the shoulders about 45 degrees.  Internal and external rotation are significantly limited as well, and she complains of pain with this.  IMPRESSION: 1. Left rotator cuff injury currently followed by Lisa Ewing.  MRI     of the left shoulders pending per Lisa Ewing. 2. Status post right total hip arthroplasty, Lisa Ewing on December 31, 2010. 3. History of right  knee injury currently followed by Lisa Ewing as     well.  Apparently her July 03, 2011, appointment with him was     delayed to the next week or so. 4. Chronic right S1 radiculopathy, stable. 5. Long history of fibromyalgia, stable. 6. History of intermittent trochanteric bursitis. 7. Cervicalgia, status post fall.  We will obtain some cervical     radiographs, flexion and extension views.  Incidentally, she also asked me to look at a small lump she felt in her right thigh.  She laid  down and I examined this area, it is about a 1 cm x 1 cm movable mass just under the skin, may be consistent with a lipoma.  I asked her to follow up with her primary care regarding this as well since she does have history of multiple cancers.  She states she agrees with this and she will follow up with Lisa Ewing.  Her medical problems are numerous include history of uterine cancer, cervical cancer.  She has a history of thyroid gland removal and is on thyroid replacement, history of precancerous lesions in her larynx and pharynx and esophagus, history of nicotine addiction.  PLAN:  I will refill her hydrocodone for 10/325 1 p.o. t.i.d. #90, no refills.  She does not need a refill on her trazodone or gabapentin at this time.  Her last urine drug screen was on May 02, 2011, and was consistent.  We will have her follow up with nurse practitioner over the next couple of months.  I will see her back in about 3 months.  I have answered all her questions.  She is comfortable with our plan at this time.     Lisa Ewing, M.D. Electronically Signed    DMK/MedQ D:  07/09/2011 14:42:35  T:  07/10/2011 03:11:28  Job #:  [id number]

## 2011-07-21 ENCOUNTER — Other Ambulatory Visit: Payer: Self-pay | Admitting: Physical Medicine and Rehabilitation

## 2011-07-21 ENCOUNTER — Ambulatory Visit (HOSPITAL_COMMUNITY)
Admission: RE | Admit: 2011-07-21 | Discharge: 2011-07-21 | Disposition: A | Discharge status: Home / Self Care | Payer: Medicaid Other | Source: Ambulatory Visit | Attending: Family Medicine | Admitting: Family Medicine

## 2011-07-21 ENCOUNTER — Ambulatory Visit (HOSPITAL_COMMUNITY)
Admission: RE | Admit: 2011-07-21 | Discharge: 2011-07-21 | Disposition: A | Discharge status: Home / Self Care | Payer: Medicaid Other | Source: Ambulatory Visit | Attending: Physical Medicine and Rehabilitation | Admitting: Physical Medicine and Rehabilitation

## 2011-07-21 DIAGNOSIS — M542 Cervicalgia: Secondary | ICD-10-CM

## 2011-07-21 DIAGNOSIS — Z139 Encounter for screening, unspecified: Secondary | ICD-10-CM

## 2011-07-21 DIAGNOSIS — Z1231 Encounter for screening mammogram for malignant neoplasm of breast: Secondary | ICD-10-CM | POA: Insufficient documentation

## 2011-07-22 ENCOUNTER — Encounter: Payer: Self-pay | Admitting: Internal Medicine

## 2011-07-23 ENCOUNTER — Ambulatory Visit: Payer: Medicaid Other | Admitting: Gastroenterology

## 2011-08-06 ENCOUNTER — Encounter: Payer: Medicaid Other | Attending: Neurosurgery | Admitting: Neurosurgery

## 2011-08-06 DIAGNOSIS — M79609 Pain in unspecified limb: Secondary | ICD-10-CM | POA: Insufficient documentation

## 2011-08-06 DIAGNOSIS — M542 Cervicalgia: Secondary | ICD-10-CM

## 2011-08-06 DIAGNOSIS — IMO0001 Reserved for inherently not codable concepts without codable children: Secondary | ICD-10-CM

## 2011-08-06 DIAGNOSIS — M76899 Other specified enthesopathies of unspecified lower limb, excluding foot: Secondary | ICD-10-CM

## 2011-08-06 DIAGNOSIS — M25529 Pain in unspecified elbow: Secondary | ICD-10-CM

## 2011-08-06 DIAGNOSIS — M533 Sacrococcygeal disorders, not elsewhere classified: Secondary | ICD-10-CM | POA: Insufficient documentation

## 2011-08-06 DIAGNOSIS — Z96649 Presence of unspecified artificial hip joint: Secondary | ICD-10-CM | POA: Insufficient documentation

## 2011-08-06 NOTE — Assessment & Plan Note (Signed)
This is a patient of Dr. Pamelia Hoit, who is seen for generalized pain in the upper and lower extremities.  She underwent a hip arthroplasty on the right with Dr. Charlann Boxer who is currently being treated for a knee injury on the same side by Dr. Charlann Boxer.  She reports no change in her pain, it is a 9, it is a sharp, burning, stabbing, tingling, aching pain that is constant.  General activity level is 8-9.  The pain is same 24 hours a day.  All activities aggravate.  Rest, heat, medication, and TENS unit helps.  She uses a walker and electric chair for ambulation.  She does need help with transfer.  She can walk about 10 minutes at a time.  She does not climb steps or drive.  Functionally, she needs help with ADLs and household duties.  REVIEW OF SYSTEMS:  Notable for difficulties described above as well as some bladder control issues, weakness, paresthesias, tremors, trouble walking, spasms, dizziness, confusion, depression, anxiety.  No suicidal thoughts or aberrant behaviors.  Last pill count and UDS are consistent.  Past Medical history, social history, family history unchanged.  PHYSICAL EXAMINATION:  VITAL SIGNS:  Blood pressure 131/57, pulse 87, respirations 18, O2 sats 90 on room air. MUSCULOSKELETAL:  Her motor strength is diminished in the upper and lower extremities.  Sensation appears to be intact. CONSTITUTIONAL:  She is morbidly obese.  She is alert and oriented x3. Again, she uses a wheelchair for mobilization.  IMPRESSION: 1. History of left rotator cuff injury. 2. Right hip arthroplasty with Dr. Charlann Boxer. 3. Right knee injury, treated by Dr. Charlann Boxer. 4. Chronic right S1 radiculopathy. 5. Fibromyalgia. 6. Cervicalgia.  PLAN: 1. Refill Norco 10/325 one p.o. t.i.d., to get her through until she     sees Dr. Pamelia Hoit in early January.  I gave her 111 with no     refill. 2. We will order an MRI of the cervical spine so Dr. Pamelia Hoit will     have that for review in January per the  patient's request.  She     states she has already talked to Dr. Pamelia Hoit about that.     Otherwise, her questions were encouraged and answered, and I will     see her back in January.     Lisa Ewing L. Blima Dessert Electronically Signed    RLW/MedQ D:  08/06/2011 09:45:34  T:  08/06/2011 10:05:32  Job #:  [id number]

## 2011-08-13 ENCOUNTER — Other Ambulatory Visit: Payer: Self-pay | Admitting: Physical Medicine and Rehabilitation

## 2011-08-13 DIAGNOSIS — IMO0001 Reserved for inherently not codable concepts without codable children: Secondary | ICD-10-CM

## 2011-08-13 DIAGNOSIS — IMO0002 Reserved for concepts with insufficient information to code with codable children: Secondary | ICD-10-CM

## 2011-08-13 DIAGNOSIS — M76899 Other specified enthesopathies of unspecified lower limb, excluding foot: Secondary | ICD-10-CM

## 2011-08-13 DIAGNOSIS — M542 Cervicalgia: Secondary | ICD-10-CM

## 2011-08-19 ENCOUNTER — Other Ambulatory Visit: Payer: Medicaid Other

## 2011-08-22 ENCOUNTER — Other Ambulatory Visit: Payer: Medicaid Other

## 2011-08-26 ENCOUNTER — Ambulatory Visit
Admission: RE | Admit: 2011-08-26 | Discharge: 2011-08-26 | Disposition: A | Discharge status: Home / Self Care | Payer: Medicaid Other | Source: Ambulatory Visit | Attending: Physical Medicine and Rehabilitation | Admitting: Physical Medicine and Rehabilitation

## 2011-08-26 DIAGNOSIS — IMO0002 Reserved for concepts with insufficient information to code with codable children: Secondary | ICD-10-CM

## 2011-08-26 DIAGNOSIS — M76899 Other specified enthesopathies of unspecified lower limb, excluding foot: Secondary | ICD-10-CM

## 2011-08-26 DIAGNOSIS — IMO0001 Reserved for inherently not codable concepts without codable children: Secondary | ICD-10-CM

## 2011-08-26 DIAGNOSIS — M542 Cervicalgia: Secondary | ICD-10-CM

## 2011-09-15 ENCOUNTER — Encounter
Payer: Medicaid Other | Attending: Physical Medicine and Rehabilitation | Admitting: Physical Medicine and Rehabilitation

## 2011-09-15 DIAGNOSIS — G894 Chronic pain syndrome: Secondary | ICD-10-CM

## 2011-09-15 DIAGNOSIS — Z79899 Other long term (current) drug therapy: Secondary | ICD-10-CM | POA: Insufficient documentation

## 2011-09-15 DIAGNOSIS — M542 Cervicalgia: Secondary | ICD-10-CM | POA: Insufficient documentation

## 2011-09-15 DIAGNOSIS — F17200 Nicotine dependence, unspecified, uncomplicated: Secondary | ICD-10-CM | POA: Insufficient documentation

## 2011-09-15 DIAGNOSIS — E669 Obesity, unspecified: Secondary | ICD-10-CM | POA: Insufficient documentation

## 2011-09-15 DIAGNOSIS — M545 Low back pain, unspecified: Secondary | ICD-10-CM | POA: Insufficient documentation

## 2011-09-15 DIAGNOSIS — M25569 Pain in unspecified knee: Secondary | ICD-10-CM

## 2011-09-15 DIAGNOSIS — M79609 Pain in unspecified limb: Secondary | ICD-10-CM

## 2011-09-15 DIAGNOSIS — M25519 Pain in unspecified shoulder: Secondary | ICD-10-CM | POA: Insufficient documentation

## 2011-09-15 DIAGNOSIS — M25559 Pain in unspecified hip: Secondary | ICD-10-CM | POA: Insufficient documentation

## 2011-09-15 DIAGNOSIS — G5600 Carpal tunnel syndrome, unspecified upper limb: Secondary | ICD-10-CM | POA: Insufficient documentation

## 2011-09-15 DIAGNOSIS — Z96649 Presence of unspecified artificial hip joint: Secondary | ICD-10-CM | POA: Insufficient documentation

## 2011-09-15 DIAGNOSIS — IMO0001 Reserved for inherently not codable concepts without codable children: Secondary | ICD-10-CM | POA: Insufficient documentation

## 2011-09-15 DIAGNOSIS — M25529 Pain in unspecified elbow: Secondary | ICD-10-CM

## 2011-09-15 DIAGNOSIS — G8929 Other chronic pain: Secondary | ICD-10-CM | POA: Insufficient documentation

## 2011-09-16 NOTE — Assessment & Plan Note (Signed)
[name] is a pleasant 56 year old woman who has multiple pain complaints.  She has a complicated pain history.  She is followed here at the [facility] for the following problems; cervicalgia, bilateral upper extremity pain, right lower extremity pain, chronic low back pain, history of fibromyalgia.  She is followed by two other orthopedic surgeons, Dr. Simonne Come is seeing her for her left rotator cuff problem and Dr. Charlann Boxer is following her for right hip pain.  She is status post the right total hip arthroplasty on December 31, 2010.  She has a history of right knee problems and apparently an MRI of her right knee is pending per his office.  Her chief complaint today is cervicalgia and bilateral upper extremity pain.  She has undergone cervical MRI on August 26, 2011, results are attached to the chart.  She has C6-7 degenerative endplate changes with a normal spinal cord signal.  She has some foraminal stenosis left greater than right at that level as well.  She has some mother degenerative changes at other levels, which are milder.  Her average pain is about 8 on a scale of 10.  It is significantly affecting her general activity.  Pain is worse with most activities including rest.  Pain improves with medication, TENS unit, and modalities.  She gets fair relief with current meds.  FUNCTIONAL STATUS:  She can walk about 10 minutes.  She does not climb stairs nor drive.  She needs assistance with dressing, bathing, meal prep, and household duties.  REVIEW OF SYSTEMS:  Otherwise noncontributory.  She admits to depression, anxiety.  Denies suicidal ideation.  She was recently diagnosed with diabetes and is now on metformin.  She maintains contact with primary care for this problem.  Past medical, social, and family history also significant for continued smoking, half pack of cigarettes per day.  She did try to quit in October, but has since  resumed.  Again, I suggest she stops smoking.  MEDICATIONS:  Prescribed through [facility] include gabapentin 300 mg twice a day and 2 at bedtime, hydrocodone 10/325 3 times a day, and trazodone 100 mg at bedtime.  PHYSICAL EXAMINATION:  Her blood pressure is 130/67, pulse 97, respiration 18, 90% saturated on room air.  She is an obese woman in an Mining engineer wheelchair.  She is oriented x3.  Speech is clear.  Her affect is somewhat depressed today and tired.  She is upset that she is diabetic now.  She follows commands without difficulty, answers all my questions appropriately.  Her cranial nerves and coordination are intact.  Her reflexes are diminished in upper and lower extremities.  No abnormal tone, clonus, or tremors are noted.  Her motor strength is generally good in the upper extremities.  She has decreased strength with left shoulder abduction due to left shoulder pain, limited motion is noted in the shoulder as well.  She is about 45 degrees of abduction. She has about 120 degrees of abduction on the right.  She has good strength in the left lower extremity.  She has limited strength at hip flexion on the right, 1/5, and dorsiflexion is about 3-4/5 on the right.  She reports decreased sensation in the upper arm in the hands.  She reports intact sensation.  Lower extremities have patchy decreased sensation.  She is able to transition from sitting to standing for brief standing. She has some tenderness along both trochanters again.  Limited cervical range of motion especially to  the left is noted, limited lumbar motion is noted as well.  IMPRESSION: 1. Cervicalgia, status post cervical MRI without contrast, noting     predominant C6-7 degenerative disk changes with normal cord signal. 2. Right/left arm pain with history of carpal tunnel.  The patient has     seen Dr. Merlyn Lot; however, nerve conduction studies apparently have     not been ordered. 3. Chronic low back pain  with known degenerative changes.  Last MRI of     her lumbar spine was in 2010 with known chronic root encroachment     at the L5-S1 level on the right and to lesser degree at L4-5 on the     left. 4. Left chronic shoulder pain with history of rotator cuff tear     followed by Dr. Simonne Come. 5. Right lower extremity pain in the hip as well as the knee.  She has     been seen by Dr. Charlann Boxer.  Apparently recent knee radiographs were     done and within the last 5-6 months, an MRI is pending.  She will     be in touch with Dr. Charlann Boxer to follow up on this. 6. Chronic right leg pain, neuropathic most likely related to the L5-     S1 level, may have the patient followed back up again with [facility]. 7. Status post right hip replacement in the spring of 2012 with     chronic pain.  PLAN:  At this time, I will continue the use of her pain medications. She has been taking them as prescribed without evidence of aberrant behavior.  Her last urine drug screen was on May 18, 2011, and was consistent.  Her pill count is correct.  We will refill her meds as noted above.  Again, I have asked her to stop smoking, I asked her maintain contact with primary care and her other medical providers.  At this point, we will get her set up for nerve conduction studies and EMG in bilateral upper extremities to evaluate bilateral upper extremity pain.  We also discussed possibly having her follow back up with [facility] again to reassess lumbar spine.  I have answered all her questions.  She is comfortable with our plan at this time.     Brantley Stage, M.D. Electronically Signed    DMK/MedQ D:  09/15/2011 10:53:46  T:  09/16/2011 40:98:11  Job #:  [id number]

## 2011-10-10 ENCOUNTER — Ambulatory Visit: Payer: Medicaid Other | Admitting: Physical Medicine and Rehabilitation

## 2011-10-15 ENCOUNTER — Encounter
Payer: Medicaid Other | Attending: Physical Medicine and Rehabilitation | Admitting: Physical Medicine and Rehabilitation

## 2011-10-15 DIAGNOSIS — M545 Low back pain, unspecified: Secondary | ICD-10-CM | POA: Insufficient documentation

## 2011-10-15 DIAGNOSIS — R209 Unspecified disturbances of skin sensation: Secondary | ICD-10-CM | POA: Insufficient documentation

## 2011-10-15 DIAGNOSIS — M62838 Other muscle spasm: Secondary | ICD-10-CM | POA: Insufficient documentation

## 2011-10-15 DIAGNOSIS — M76899 Other specified enthesopathies of unspecified lower limb, excluding foot: Secondary | ICD-10-CM | POA: Insufficient documentation

## 2011-10-15 DIAGNOSIS — M25519 Pain in unspecified shoulder: Secondary | ICD-10-CM | POA: Insufficient documentation

## 2011-10-15 DIAGNOSIS — G8929 Other chronic pain: Secondary | ICD-10-CM | POA: Insufficient documentation

## 2011-10-15 DIAGNOSIS — M79609 Pain in unspecified limb: Secondary | ICD-10-CM

## 2011-10-15 DIAGNOSIS — R5381 Other malaise: Secondary | ICD-10-CM | POA: Insufficient documentation

## 2011-10-15 DIAGNOSIS — R259 Unspecified abnormal involuntary movements: Secondary | ICD-10-CM | POA: Insufficient documentation

## 2011-10-15 DIAGNOSIS — R279 Unspecified lack of coordination: Secondary | ICD-10-CM

## 2011-10-15 DIAGNOSIS — G894 Chronic pain syndrome: Secondary | ICD-10-CM

## 2011-10-15 DIAGNOSIS — M25569 Pain in unspecified knee: Secondary | ICD-10-CM | POA: Insufficient documentation

## 2011-10-15 DIAGNOSIS — M542 Cervicalgia: Secondary | ICD-10-CM | POA: Insufficient documentation

## 2011-10-16 NOTE — Assessment & Plan Note (Signed)
HISTORY OF PRESENT ILLNESS:  Lisa Ewing is a pleasant 56 year old woman who is followed here at [facility] for multiple chronic pain complaints.  She continues to have problems with cervicalgia, left upper extremity pain, right shoulder pain, low back pain, right lower extremity pain, right knee pain, and left trochanteric bursitis.  Average pain is about 8 on a scale of 10. Her pain limits her from being able to ambulate currently, especially the right lower extremity.  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  She also has known right S1 root involvement.  Pain is worse with walking, standing, improves with rest, heat, medication, and TENS unit.  She reports fair relief with current meds.  She is back in today for refill of her medication.  She continues to use gabapentin 300 mg twice a day and 2 tablets at night as well as hydrocodone 10/325 three times a day.  REVIEW OF SYSTEMS:  Positive for weakness, numbness, tremors, trouble walking, spasms, dizziness, depression, anxiety.  Denies suicidal ideation.  Denies problems controlling bowel.  Review of systems otherwise unchanged.  PAST MEDICAL/SOCIAL/FAMILY HISTORY:  Unchanged from previous visit.  PHYSICAL EXAMINATION:  VITAL SIGNS:  Blood pressure is 126/69, pulse 90, respirations 18, and 92% saturated on room air. GENERAL:  She is a well-developed, obese woman who does not appear in any distress.  She does appear tired today.  She is oriented x3.  She is alert, cooperative, and pleasant.  Follows commands without difficulty. Answers my questions appropriately.  She tells me she did not sleep well again last night.  Her right leg had been bothering her. NEUROLOGIC:  Cranial nerves, coordination are intact.  Reflexes are diminished in the lower extremity.  No abnormal tone or clonus is noted. She has relatively well-preserved strength in both lower extremities. She has pain with  straight leg raise on the right.  She has difficulty standing due to right leg pain.  Limitations in lumbar motion are noted.  IMPRESSION: 1. Chronic right leg pain.  It is most likely multifactorial in     nature.  She has been seeing Dr. Charlann Boxer for possible right knee     replacement.  She has known chronic root encroachment at the L5-S1     level, lesser degree at L4-5 on the left. 2. Her other pain complaints include intermittent cervicalgia and she     is status post right hip replacement in spring 2012.  At this point, over the last couple of months, she has reported increasing right leg pain.  She has been having difficulty sleeping and had been doing some more standing earlier in the year, but now is having difficulty doing that due to the right leg pain.  PLAN:  We would like to repeat MRI of her lumbar spine.  Her last MRI was done in 2010, and we will set her up for electrodiagnostic studies of the right lower extremity.  I refilled her pain medications, hydrocodone 10/325 three times a day, #90, and I will increase her gabapentin to 300 mg 1 p.o. q.9 a.m., 2 p.m. and 6 p.m., 2 tablets at 11 p.m. and 3 a.m.  I have answered all her questions.  She is comfortable with the plan.     Brantley Stage, M.D.    DMK/MedQ D:  10/15/2011 14:05:29  T:  10/16/2011 06:53:19  Job #:  [id number]

## 2011-10-17 ENCOUNTER — Other Ambulatory Visit: Payer: Self-pay | Admitting: Physical Medicine and Rehabilitation

## 2011-10-17 DIAGNOSIS — M545 Low back pain: Secondary | ICD-10-CM

## 2011-10-24 ENCOUNTER — Encounter (HOSPITAL_BASED_OUTPATIENT_CLINIC_OR_DEPARTMENT_OTHER): Payer: Medicaid Other | Admitting: Physical Medicine and Rehabilitation

## 2011-10-24 DIAGNOSIS — G894 Chronic pain syndrome: Secondary | ICD-10-CM

## 2011-10-24 DIAGNOSIS — M545 Low back pain, unspecified: Secondary | ICD-10-CM

## 2011-10-24 DIAGNOSIS — M79609 Pain in unspecified limb: Secondary | ICD-10-CM

## 2011-10-28 ENCOUNTER — Ambulatory Visit
Admission: RE | Admit: 2011-10-28 | Discharge: 2011-10-28 | Disposition: A | Discharge status: Home / Self Care | Payer: Medicaid Other | Source: Ambulatory Visit | Attending: Physical Medicine and Rehabilitation | Admitting: Physical Medicine and Rehabilitation

## 2011-10-28 DIAGNOSIS — M545 Low back pain: Secondary | ICD-10-CM

## 2011-11-04 ENCOUNTER — Ambulatory Visit: Payer: Medicaid Other | Admitting: Internal Medicine

## 2011-11-17 ENCOUNTER — Encounter
Payer: Medicaid Other | Attending: Physical Medicine and Rehabilitation | Admitting: Physical Medicine and Rehabilitation

## 2011-11-17 ENCOUNTER — Encounter: Payer: Self-pay | Admitting: Physical Medicine and Rehabilitation

## 2011-11-17 VITALS — BP 122/73 | HR 89 | Resp 18 | Ht 65.0 in | Wt 264.0 lb

## 2011-11-17 DIAGNOSIS — G8929 Other chronic pain: Secondary | ICD-10-CM | POA: Insufficient documentation

## 2011-11-17 DIAGNOSIS — M25569 Pain in unspecified knee: Secondary | ICD-10-CM | POA: Insufficient documentation

## 2011-11-17 DIAGNOSIS — M79609 Pain in unspecified limb: Secondary | ICD-10-CM | POA: Insufficient documentation

## 2011-11-17 DIAGNOSIS — M76899 Other specified enthesopathies of unspecified lower limb, excluding foot: Secondary | ICD-10-CM

## 2011-11-17 DIAGNOSIS — M545 Low back pain, unspecified: Secondary | ICD-10-CM | POA: Insufficient documentation

## 2011-11-17 DIAGNOSIS — M25519 Pain in unspecified shoulder: Secondary | ICD-10-CM | POA: Insufficient documentation

## 2011-11-17 DIAGNOSIS — IMO0001 Reserved for inherently not codable concepts without codable children: Secondary | ICD-10-CM | POA: Insufficient documentation

## 2011-11-17 DIAGNOSIS — E669 Obesity, unspecified: Secondary | ICD-10-CM | POA: Insufficient documentation

## 2011-11-17 DIAGNOSIS — M7062 Trochanteric bursitis, left hip: Secondary | ICD-10-CM

## 2011-11-17 DIAGNOSIS — Z79899 Other long term (current) drug therapy: Secondary | ICD-10-CM | POA: Insufficient documentation

## 2011-11-17 DIAGNOSIS — M25559 Pain in unspecified hip: Secondary | ICD-10-CM | POA: Insufficient documentation

## 2011-11-17 DIAGNOSIS — Z96649 Presence of unspecified artificial hip joint: Secondary | ICD-10-CM | POA: Insufficient documentation

## 2011-11-17 DIAGNOSIS — G5600 Carpal tunnel syndrome, unspecified upper limb: Secondary | ICD-10-CM | POA: Insufficient documentation

## 2011-11-17 DIAGNOSIS — M542 Cervicalgia: Secondary | ICD-10-CM | POA: Insufficient documentation

## 2011-11-17 DIAGNOSIS — F17200 Nicotine dependence, unspecified, uncomplicated: Secondary | ICD-10-CM | POA: Insufficient documentation

## 2011-11-17 MED ORDER — HYDROCODONE-ACETAMINOPHEN 10-325 MG PO TABS: 1.0000 | ORAL_TABLET | Freq: Three times a day (TID) | ORAL | Status: DC

## 2011-11-17 NOTE — Patient Instructions (Signed)
For left hip soreness today using ice pack for 15 minutes every couple of hours.  We will see you back in one month for a refill of your pain medications.

## 2011-11-17 NOTE — Progress Notes (Signed)
The patient is a 56 year old woman who is solid here the [facility] in her ability to medicine for multiple chronic pain complaints.  She has a long history of chronic right leg pain and has left lateral hip pain as well.  She is here today for an injection of her left lateral hip for a chronic trochanter bursitis.  She has difficulty exercising due to limitations with the right lower extremity.  Static and real time images using ultrasound were obtained. The trochanter localized in the area of increased tenderness was noted in corresponded to the greater trochanter. Longitudinal and cross sectional images were obtained and recorded. The area was then marked.  The area was cleaned with alcohol and prepped with betadine swab. 5 cc of 1% lidocaine and one cc of KENOLOG was injected without difficulty.  Patient tolerated the procedure well and reported improvement of discomfort in that area.

## 2011-11-27 ENCOUNTER — Encounter: Payer: Self-pay | Admitting: Physical Medicine and Rehabilitation

## 2011-12-01 ENCOUNTER — Encounter: Payer: Self-pay | Admitting: Internal Medicine

## 2011-12-10 ENCOUNTER — Encounter: Payer: Self-pay | Admitting: Physical Medicine and Rehabilitation

## 2011-12-16 ENCOUNTER — Other Ambulatory Visit: Payer: Self-pay | Admitting: *Deleted

## 2011-12-16 MED ORDER — TRAZODONE HCL 100 MG PO TABS: 100.0000 mg | ORAL_TABLET | Freq: Every day | ORAL | Status: DC

## 2011-12-19 ENCOUNTER — Encounter
Payer: Medicaid Other | Attending: Physical Medicine and Rehabilitation | Admitting: Physical Medicine and Rehabilitation

## 2011-12-19 ENCOUNTER — Encounter: Payer: Self-pay | Admitting: Physical Medicine and Rehabilitation

## 2011-12-19 VITALS — BP 138/65 | HR 89 | Resp 16 | Ht 65.0 in | Wt 264.0 lb

## 2011-12-19 DIAGNOSIS — M25512 Pain in left shoulder: Secondary | ICD-10-CM | POA: Insufficient documentation

## 2011-12-19 DIAGNOSIS — M545 Low back pain, unspecified: Secondary | ICD-10-CM

## 2011-12-19 DIAGNOSIS — M546 Pain in thoracic spine: Secondary | ICD-10-CM

## 2011-12-19 DIAGNOSIS — M79609 Pain in unspecified limb: Secondary | ICD-10-CM

## 2011-12-19 DIAGNOSIS — M25519 Pain in unspecified shoulder: Secondary | ICD-10-CM

## 2011-12-19 DIAGNOSIS — G8929 Other chronic pain: Secondary | ICD-10-CM | POA: Insufficient documentation

## 2011-12-19 DIAGNOSIS — M79606 Pain in leg, unspecified: Secondary | ICD-10-CM | POA: Insufficient documentation

## 2011-12-19 DIAGNOSIS — M25511 Pain in right shoulder: Secondary | ICD-10-CM | POA: Insufficient documentation

## 2011-12-19 DIAGNOSIS — M542 Cervicalgia: Secondary | ICD-10-CM

## 2011-12-19 MED ORDER — HYDROCODONE-ACETAMINOPHEN 10-325 MG PO TABS: 1.0000 | ORAL_TABLET | Freq: Three times a day (TID) | ORAL | Status: DC

## 2011-12-19 NOTE — Patient Instructions (Signed)
Please keep your pain medications locked into secure location.   I will see you back next month.  I have ordered a thoracic MRI.  I am also referring you to Dr. Danielle Dess. He has seen you in the past.

## 2011-12-19 NOTE — Progress Notes (Signed)
Subjective:    Patient ID: Lisa Ewing, female    DOB: 04-04-1956, 56 y.o.   MRN: [id number]  HPI s. Lisa Ewing is a pleasant 56 year old woman who has multiple pain  complaints. She has a complicated pain history. She is followed here  at the [facility] for the following  problems; cervicalgia, bilateral upper extremity pain, right lower  extremity pain, chronic low back pain,thoracic upper back back pain, history of fibromyalgia.  She is followed by two other orthopedic surgeons, Dr. Simonne Come is  seeing her for her left rotator cuff problem and Dr. Charlann Boxer is following  her for right hip pain. She is status post the right total hip  arthroplasty on December 31, 2010. She has a history of right knee  problems and apparently an MRI of her right knee is pending per his  office.  Her chief complaint today is cervicalgia and bilateral upper extremity  pain. She has undergone cervical MRI on August 26, 2011, results are  attached to the chart. She has C6-7 degenerative endplate changes with  a normal spinal cord signal. She has some foraminal stenosis left  greater than right at that level as well.   She is also mentioning her upper back today, has a history of falls.  Her biggest complaint is right lower extremity pain, followed by back pain.  She reports improvement in her hip pain after the hip replacement. However she is still having pain in the right lower extremity which radiates from the low back through her right leg to the foot.   Pain Inventory Average Pain 8 Pain Right Now 8 My pain is constant, sharp, burning, dull, stabbing and aching  In the last 24 hours, has pain interfered with the following? General activity 9 Relation with others 9 Enjoyment of life 9 What TIME of day is your pain at its worst? all the time Sleep (in general) Fair  Pain is worse with: walking, bending, sitting, standing and some activites Pain improves with:  rest, heat/ice, medication and TENS Relief from Meds: 5  Mobility how many minutes can you walk? 0 ability to climb steps?  no do you drive?  no use a wheelchair needs help with transfers Do you have any goals in this area?  yes  Function disabled: date disabled 2010 I need assistance with the following:  dressing, bathing, meal prep, household duties and shopping Do you have any goals in this area?  yes  Neuro/Psych bladder control problems weakness numbness trouble walking spasms dizziness depression anxiety  Prior Studies Any changes since last visit?  no  Physicians involved in your care Any changes since last visit?  no     Review of Systems  Musculoskeletal: Positive for gait problem.  Neurological: Positive for dizziness, weakness and numbness.  Psychiatric/Behavioral: Positive for dysphoric mood. The patient is nervous/anxious.   All other systems reviewed and are negative.       Objective:   Physical Exam She is an obese woman in an  electric wheelchair. She is oriented x3. Speech is clear. Her affect  is somewhat depressed today and tired. She is upset that she is  diabetic now. She follows commands without difficulty, answers all my  questions appropriately. Her cranial nerves and coordination are  intact. Her reflexes are diminished in upper and lower extremities. No  abnormal tone, clonus, or tremors are noted.   Her motor strength is  generally good in the upper extremities. She has  decreased strength  with left shoulder abduction due to left shoulder pain, limited motion  is noted in the shoulder as well. She is about 45 degrees of abduction.  She has about 120 degrees of abduction on the right. She has good  strength in the left lower extremity. She has limited strength at hip  flexion on the right, 1/5, and dorsiflexion is about 3-4/5 on the right.   She  reports intact sensation. Lower extremities have patchy decreased  Sensation on the  left.  She is able to transition from sitting to standing for brief standing.  She has some tenderness along both trochanters again. Limited cervical  range of motion especially to the left is noted, limited lumbar motion  is noted as well.   Tenderness is noted at approximately T5. She is s/p recent fall.        Assessment & Plan:  1. Right lower extremity pain and chronic low back pain with known right S1 root compression. Will have Dr. Danielle Dess reevaluate her.  2.Thoracic pain with a history of falls limited standing tolerance, Will check thoracic MRI  3.Left shoulder pain 06/09/11  xray report Mild lateral downsloping acromion. AC joint OA, this problem is lower on her list to have addressed. Will most likely refer to orthopedist within the next year or so.  She is most interested in having her low back reassessed by a neurosurgeon at this time.  I will see her back in one month. Pain medications refilled. She takes them as prescribed without aberrant behavior. Pill counts are appropriate.

## 2011-12-30 ENCOUNTER — Ambulatory Visit: Payer: Medicaid Other | Admitting: Internal Medicine

## 2012-01-05 ENCOUNTER — Emergency Department (HOSPITAL_COMMUNITY)
Admission: EM | Admit: 2012-01-05 | Discharge: 2012-01-05 | Disposition: A | Discharge status: Home / Self Care | Payer: Medicaid Other | Attending: Emergency Medicine | Admitting: Emergency Medicine

## 2012-01-05 ENCOUNTER — Emergency Department (HOSPITAL_COMMUNITY): Payer: Medicaid Other

## 2012-01-05 ENCOUNTER — Encounter (HOSPITAL_COMMUNITY): Payer: Self-pay

## 2012-01-05 DIAGNOSIS — W19XXXA Unspecified fall, initial encounter: Secondary | ICD-10-CM

## 2012-01-05 DIAGNOSIS — W010XXA Fall on same level from slipping, tripping and stumbling without subsequent striking against object, initial encounter: Secondary | ICD-10-CM | POA: Insufficient documentation

## 2012-01-05 DIAGNOSIS — J449 Chronic obstructive pulmonary disease, unspecified: Secondary | ICD-10-CM | POA: Insufficient documentation

## 2012-01-05 DIAGNOSIS — S32000A Wedge compression fracture of unspecified lumbar vertebra, initial encounter for closed fracture: Secondary | ICD-10-CM

## 2012-01-05 DIAGNOSIS — Z79899 Other long term (current) drug therapy: Secondary | ICD-10-CM | POA: Insufficient documentation

## 2012-01-05 DIAGNOSIS — S32009A Unspecified fracture of unspecified lumbar vertebra, initial encounter for closed fracture: Secondary | ICD-10-CM | POA: Insufficient documentation

## 2012-01-05 DIAGNOSIS — J4489 Other specified chronic obstructive pulmonary disease: Secondary | ICD-10-CM | POA: Insufficient documentation

## 2012-01-05 DIAGNOSIS — R32 Unspecified urinary incontinence: Secondary | ICD-10-CM | POA: Insufficient documentation

## 2012-01-05 LAB — URINALYSIS, ROUTINE W REFLEX MICROSCOPIC
Bilirubin Urine: NEGATIVE
Glucose, UA: NEGATIVE mg/dL
Ketones, ur: NEGATIVE mg/dL
Protein, ur: NEGATIVE mg/dL
pH: 5.5 (ref 5.0–8.0)

## 2012-01-05 MED ORDER — OXYCODONE-ACETAMINOPHEN 5-325 MG PO TABS
2.0000 | ORAL_TABLET | Freq: Once | ORAL | Status: DC
  Filled 2012-01-05: qty 2

## 2012-01-05 MED ORDER — HYDROMORPHONE HCL PF 2 MG/ML IJ SOLN
2.0000 mg | Freq: Once | INTRAMUSCULAR | Status: AC
  Administered 2012-01-05: 2 mg via INTRAMUSCULAR
  Filled 2012-01-05: qty 1

## 2012-01-05 MED ORDER — KETOROLAC TROMETHAMINE 60 MG/2ML IM SOLN
60.0000 mg | Freq: Once | INTRAMUSCULAR | Status: AC
  Administered 2012-01-05: 60 mg via INTRAMUSCULAR
  Filled 2012-01-05: qty 2
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
## 2012-01-05 NOTE — ED Notes (Signed)
Fell last night, today cont. To have right hip pain. Left ankle pain

## 2012-01-05 NOTE — Discharge Instructions (Signed)
Back, Compression Fracture Followup in the pain management clinic as scheduled. Return to the ED if you develop new or worsening symptoms. A compression fracture happens when a force is put upon the length of your spine. Slipping and falling on your bottom are examples of such a force. When this happens, sometimes the force is great enough to compress the building blocks (vertebral bodies) of your spine. Although this causes a lot of pain, this can usually be treated at home, unless your caregiver feels hospitalization is needed for pain control. Your backbone (spinal column) is made up of 24 main vertebral bodies in addition to the sacrum and coccyx (see illustration). These are held together by tough fibrous tissues (ligaments) and by support of your muscles. Nerve roots pass through the openings between the vertebrae. A sudden wrenching move, injury, or a fall may cause a compression fracture of one of the vertebral bodies. This may result in back pain or spread of pain into the belly (abdomen), the buttocks, and down the leg into the foot. Pain may also be created by muscle spasm alone. Large studies have been undertaken to determine the best possible course of action to help your back following injury and also to prevent future problems. The recommendations are as follows. FOLLOWING A COMPRESSION FRACTURE: Do the following only if advised by your caregiver.   If a back brace has been suggested or provided, wear it as directed.   DO NOT stop wearing the back brace unless instructed by your caregiver.   When allowed to return to regular activities, avoid a sedentary life style. Actively exercise. Sporadic weekend binges of tennis, racquetball, water skiing, may actually aggravate or create problems, especially if you are not in condition for that activity.   Avoid sports requiring sudden body movements until you are in condition for them. Swimming and walking are safer activities.   Maintain good  posture.   Avoid obesity.   If not already done, you should have a DEXA scan. Based on the results, be treated for osteoporosis.  FOLLOWING ACUTE (SUDDEN) INJURY:  Only take over-the-counter or prescription medicines for pain, discomfort, or fever as directed by your caregiver.   Use bed rest for only the most extreme acute episode. Prolonged bed rest may aggravate your condition. Ice used for acute conditions is effective. Use a large plastic bag filled with ice. Wrap it in a towel. This also provides excellent pain relief. This may be continuous. Or use it for 30 minutes every 2 hours during acute phase, then as needed. Heat for 30 minutes prior to activities is helpful.   As soon as the acute phase (the time when your back is too painful for you to do normal activities) is over, it is important to resume normal activities and work Arboriculturist. Back injuries can cause potentially marked changes in lifestyle. So it is important to attack these problems aggressively.   See your caregiver for continued problems. He or she can help or refer you for appropriate exercises, physical therapy and work hardening if needed.   If you are given narcotic medications for your condition, for the next 24 hours DO NOT:   Drive   Operate machinery or power tools.   Sign legal documents.   DO NOT drink alcohol, take sleeping pills or other medications that may interfere with treatment.  If your caregiver has given you a follow-up appointment, it is very important to keep that appointment. Not keeping the appointment could result in  a chronic or permanent injury, pain, and disability. If there is any problem keeping the appointment, you must call back to this facility for assistance.  SEEK IMMEDIATE MEDICAL CARE IF:  You develop numbness, tingling, weakness, or problems with the use of your arms or legs.   You develop severe back pain not relieved with medications.   You have changes in bowel or  bladder control.   You have increasing pain in any areas of the body.  Document Released: 08/25/2005 Document Revised: 08/14/2011 Document Reviewed: 03/29/2008 [facility] Patient Information 2012 [place], [place].

## 2012-01-05 NOTE — ED Notes (Signed)
Pt presents via EMS after a sustained fall last night while transferring from commode to bed. Pt c/o rt hip pain with loss of bladder function at time of fall. Denies incontinence at this time.  Bilateral lower extremities noted of equal length. Pt states lives with daughter and does not walk in the home secondary to multiple back fractures. Pt placed on bedpan at her request.

## 2012-01-07 ENCOUNTER — Telehealth: Payer: Self-pay | Admitting: Physical Medicine and Rehabilitation

## 2012-01-07 NOTE — Telephone Encounter (Signed)
Noted  

## 2012-01-07 NOTE — Telephone Encounter (Signed)
Patient had recent fall.  Had MRI done while at hospital.

## 2012-01-14 ENCOUNTER — Encounter: Payer: Self-pay | Admitting: Physical Medicine and Rehabilitation

## 2012-01-14 ENCOUNTER — Encounter
Payer: Medicaid Other | Attending: Physical Medicine and Rehabilitation | Admitting: Physical Medicine and Rehabilitation

## 2012-01-14 VITALS — BP 117/45 | HR 80 | Resp 18 | Ht 65.0 in | Wt 245.0 lb

## 2012-01-14 DIAGNOSIS — M546 Pain in thoracic spine: Secondary | ICD-10-CM

## 2012-01-14 DIAGNOSIS — M25511 Pain in right shoulder: Secondary | ICD-10-CM

## 2012-01-14 DIAGNOSIS — M545 Low back pain, unspecified: Secondary | ICD-10-CM

## 2012-01-14 DIAGNOSIS — M79609 Pain in unspecified limb: Secondary | ICD-10-CM | POA: Insufficient documentation

## 2012-01-14 DIAGNOSIS — G8929 Other chronic pain: Secondary | ICD-10-CM

## 2012-01-14 DIAGNOSIS — M79672 Pain in left foot: Secondary | ICD-10-CM

## 2012-01-14 DIAGNOSIS — M542 Cervicalgia: Secondary | ICD-10-CM

## 2012-01-14 DIAGNOSIS — M25519 Pain in unspecified shoulder: Secondary | ICD-10-CM

## 2012-01-14 DIAGNOSIS — R2689 Other abnormalities of gait and mobility: Secondary | ICD-10-CM

## 2012-01-14 DIAGNOSIS — R269 Unspecified abnormalities of gait and mobility: Secondary | ICD-10-CM

## 2012-01-14 MED ORDER — HYDROCODONE-ACETAMINOPHEN 10-325 MG PO TABS: 1.0000 | ORAL_TABLET | Freq: Three times a day (TID) | ORAL | Status: DC

## 2012-01-14 MED ORDER — GABAPENTIN 300 MG PO CAPS: 300.0000 mg | ORAL_CAPSULE | Freq: Four times a day (QID) | ORAL | Status: DC

## 2012-01-14 NOTE — Patient Instructions (Signed)
I have ordered a thoracic x-ray and a left foot x-ray.  Keep your medications locked up and in a secure location  I will see you back in one month

## 2012-01-14 NOTE — Progress Notes (Signed)
Subjective:    Patient ID: Lisa Ewing, female    DOB: 1956-05-10, 56 y.o.   MRN: [id number]  HPI Lisa Ewing is a pleasant 56 year old woman who has multiple pain  complaints. She has a complicated pain history. She is followed here  at the [facility] for the following  problems; cervicalgia, bilateral upper extremity pain, right lower  extremity pain, chronic low back pain,thoracic upper back back pain, history of fibromyalgia.  She is followed by two other orthopedic surgeons, Dr. Simonne Come is  seeing her for her left rotator cuff problem and Dr. Charlann Boxer is following  her for right hip pain. She is status post the right total hip  arthroplasty on December 31, 2010. She has a history of right knee  problems and apparently an MRI of her right knee is pending per his  office.   She has undergone cervical MRI on August 26, 2011, results are  attached to the chart. She has C6-7 degenerative endplate changes with  a normal spinal cord signal. She has some foraminal stenosis left  greater than right at that level as well.    The patient is status post a fall 01/05/2012. She was seen in the emergency room lumbar MRI was done as well as left ankle x-ray.  Patient has had several previous falls. She complains of tenderness and pain in the mid scapular region at approximately T4 or T5. Since been bothering her since previous fall.  She continues to have chronic right leg pain.   Pain Inventory Average Pain 9 Pain Right Now 10 My pain is sharp, burning, stabbing, tingling and aching  In the last 24 hours, has pain interfered with the following? General activity 10 Relation with others 10 Enjoyment of life 10 What TIME of day is your pain at its worst? All day Sleep (in general) Poor  Pain is worse with: walking, bending, sitting, standing and some activites Pain improves with: rest, heat/ice, medication and TENS Relief from Meds: 5  Mobility how  many minutes can you walk? 0 ability to climb steps?  no do you drive?  no use a wheelchair needs help with transfers  Function I need assistance with the following:  dressing, bathing, meal prep, household duties and shopping  Neuro/Psych bladder control problems weakness numbness tremor trouble walking spasms dizziness confusion depression anxiety  Prior Studies x-rays CT/MRI  Physicians involved in your care Any changes since last visit?  no   Review of Systems  Constitutional: Negative.   HENT: Negative.   Eyes: Negative.   Respiratory: Negative.   Genitourinary: Negative.   Musculoskeletal: Positive for gait problem.  Skin: Negative.   Neurological: Positive for dizziness and numbness.  Psychiatric/Behavioral: Positive for confusion. The patient is nervous/anxious.        Objective:   Physical Exam  She is an obese woman in an  electric wheelchair. She is oriented x3. Speech is clear. Her affect  is somewhat depressed today and tired. She is upset that she is  diabetic now. She follows commands without difficulty, answers all my  questions appropriately. Her cranial nerves and coordination are  intact. Her reflexes are diminished in upper and lower extremities. No  abnormal tone, clonus, or tremors are noted.  Her motor strength is  generally good in the upper extremities. She has decreased strength  with left shoulder abduction due to left shoulder pain, limited motion  is noted in the shoulder as well. She is about 40  degrees of abduction.  She has about 120 degrees of abduction on the right. She has good  strength in the left lower extremity. She has limited strength at hip  flexion on the right, 1/5, and dorsiflexion is about 3-4/5 on the right.  She  reports intact sensation. Lower extremities have patchy decreased  Sensation on the left.  She is able to transition from sitting to standing for brief standing.  She has some tenderness along both  trochanters again. Limited cervical  range of motion especially to the left is noted, limited lumbar motion  is noted as well.  Tenderness is noted at approximately T5. She is s/p recent fall.   Left foot pain with palpation over fourth and fifth metatarsal heads. There is no obvious deformity no ecchymosis or erythema. Tenderness over fourth and fifth metatarsal heads. The patient complains of pain with inversion of the left foot.       Assessment & Plan:  1. Right lower extremity pain and chronic low back pain with known right S1 root compression.Chronic neuropathic right lower extremity pain.  2.Thoracic pain with a history of falls limited standing tolerance, Will check thoracic xray moving toward MRI   3.Left shoulder pain 06/09/11 xray report Mild lateral downsloping acromion. AC joint OA, this problem is lower on her list to have addressed.   4. New right foot pain status post fall with tenderness over the fourth and fifth metatarsal heads. We'll check foot x-ray.  May be ligamentous.  Thoracic x-ray ordered  Left foot x-ray ordered     Pain medications refilled. She takes them as prescribed without aberrant behavior. Pill counts are  Appropriate.  Followup in one month

## 2012-01-16 ENCOUNTER — Ambulatory Visit: Payer: Medicaid Other | Admitting: Physical Medicine and Rehabilitation

## 2012-01-20 ENCOUNTER — Ambulatory Visit: Payer: Medicaid Other | Admitting: Internal Medicine

## 2012-01-23 ENCOUNTER — Ambulatory Visit (HOSPITAL_COMMUNITY)
Admission: RE | Admit: 2012-01-23 | Discharge: 2012-01-23 | Disposition: A | Discharge status: Home / Self Care | Payer: Medicaid Other | Source: Ambulatory Visit | Attending: Physical Medicine and Rehabilitation | Admitting: Physical Medicine and Rehabilitation

## 2012-01-23 ENCOUNTER — Other Ambulatory Visit: Payer: Self-pay | Admitting: Physical Medicine and Rehabilitation

## 2012-01-23 ENCOUNTER — Telehealth: Payer: Self-pay | Admitting: *Deleted

## 2012-01-23 DIAGNOSIS — M79672 Pain in left foot: Secondary | ICD-10-CM

## 2012-01-23 DIAGNOSIS — W19XXXA Unspecified fall, initial encounter: Secondary | ICD-10-CM | POA: Insufficient documentation

## 2012-01-23 DIAGNOSIS — M79609 Pain in unspecified limb: Secondary | ICD-10-CM | POA: Insufficient documentation

## 2012-01-23 DIAGNOSIS — R2689 Other abnormalities of gait and mobility: Secondary | ICD-10-CM

## 2012-01-23 DIAGNOSIS — M549 Dorsalgia, unspecified: Secondary | ICD-10-CM

## 2012-01-23 DIAGNOSIS — R29898 Other symptoms and signs involving the musculoskeletal system: Secondary | ICD-10-CM

## 2012-01-23 DIAGNOSIS — M546 Pain in thoracic spine: Secondary | ICD-10-CM | POA: Insufficient documentation

## 2012-01-23 NOTE — Telephone Encounter (Signed)
Pt aware of normal xrays. 

## 2012-01-23 NOTE — Telephone Encounter (Signed)
Message copied by Caryl Ada on Fri Jan 23, 2012  2:41 PM ------      Message from: Ashok Cordia      Created: Fri Jan 23, 2012  2:38 PM       Please let patient know that her x-rays do not show fracture or dislocation of her foot.

## 2012-01-27 ENCOUNTER — Inpatient Hospital Stay (HOSPITAL_COMMUNITY): Admission: RE | Admit: 2012-01-27 | Payer: Medicaid Other | Source: Ambulatory Visit

## 2012-02-11 ENCOUNTER — Encounter
Payer: Medicaid Other | Attending: Physical Medicine and Rehabilitation | Admitting: Physical Medicine and Rehabilitation

## 2012-02-11 ENCOUNTER — Encounter: Payer: Self-pay | Admitting: Physical Medicine and Rehabilitation

## 2012-02-11 VITALS — BP 120/82 | Ht 65.0 in | Wt 245.0 lb

## 2012-02-11 DIAGNOSIS — M79609 Pain in unspecified limb: Secondary | ICD-10-CM

## 2012-02-11 DIAGNOSIS — G8929 Other chronic pain: Secondary | ICD-10-CM

## 2012-02-11 DIAGNOSIS — M545 Low back pain, unspecified: Secondary | ICD-10-CM

## 2012-02-11 DIAGNOSIS — M546 Pain in thoracic spine: Secondary | ICD-10-CM

## 2012-02-11 DIAGNOSIS — M75100 Unspecified rotator cuff tear or rupture of unspecified shoulder, not specified as traumatic: Secondary | ICD-10-CM | POA: Insufficient documentation

## 2012-02-11 DIAGNOSIS — R269 Unspecified abnormalities of gait and mobility: Secondary | ICD-10-CM

## 2012-02-11 DIAGNOSIS — IMO0002 Reserved for concepts with insufficient information to code with codable children: Secondary | ICD-10-CM | POA: Insufficient documentation

## 2012-02-11 DIAGNOSIS — M542 Cervicalgia: Secondary | ICD-10-CM

## 2012-02-11 MED ORDER — HYDROCODONE-ACETAMINOPHEN 10-325 MG PO TABS: 1.0000 | ORAL_TABLET | Freq: Three times a day (TID) | ORAL | Status: DC

## 2012-02-11 NOTE — Patient Instructions (Signed)
Please make sure you keep your pain medications locked up and in a secure location  Please remember to bring your pill bottles in for each visit  I will see you back in one month to review your thoracic MRI.
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
## 2012-02-16 ENCOUNTER — Ambulatory Visit
Admission: RE | Admit: 2012-02-16 | Discharge: 2012-02-16 | Disposition: A | Discharge status: Home / Self Care | Payer: Medicaid Other | Source: Ambulatory Visit | Attending: Physical Medicine and Rehabilitation | Admitting: Physical Medicine and Rehabilitation

## 2012-02-16 DIAGNOSIS — R29898 Other symptoms and signs involving the musculoskeletal system: Secondary | ICD-10-CM

## 2012-02-16 DIAGNOSIS — M549 Dorsalgia, unspecified: Secondary | ICD-10-CM

## 2012-02-16 MED ORDER — GADOBENATE DIMEGLUMINE 529 MG/ML IV SOLN
20.0000 mL | Freq: Once | INTRAVENOUS | Status: AC | PRN
  Administered 2012-02-16: 20 mL via INTRAVENOUS

## 2012-02-24 ENCOUNTER — Ambulatory Visit (INDEPENDENT_AMBULATORY_CARE_PROVIDER_SITE_OTHER): Payer: Medicaid Other | Admitting: Internal Medicine

## 2012-02-24 ENCOUNTER — Encounter: Payer: Self-pay | Admitting: Internal Medicine

## 2012-02-24 VITALS — BP 118/70 | HR 89 | Temp 97.8°F | Ht 65.0 in | Wt 245.0 lb

## 2012-02-24 DIAGNOSIS — K219 Gastro-esophageal reflux disease without esophagitis: Secondary | ICD-10-CM

## 2012-02-24 DIAGNOSIS — R1110 Vomiting, unspecified: Secondary | ICD-10-CM

## 2012-02-24 DIAGNOSIS — R1011 Right upper quadrant pain: Secondary | ICD-10-CM
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
## 2012-02-24 NOTE — Patient Instructions (Signed)
Stop Nexium  Begin Dexilant 60 mg orally daily. Samples x3 weeks provided.  Patient will let us know in 3 weeks as she is doing and we will go from there  We will provide you with literature on gastroparesis.

## 2012-03-05 ENCOUNTER — Encounter: Payer: Self-pay | Admitting: Physical Medicine and Rehabilitation

## 2012-03-05 ENCOUNTER — Encounter (HOSPITAL_BASED_OUTPATIENT_CLINIC_OR_DEPARTMENT_OTHER): Payer: Medicaid Other | Admitting: Physical Medicine and Rehabilitation

## 2012-03-05 VITALS — BP 138/84 | HR 98 | Resp 14 | Ht 65.0 in | Wt 245.0 lb

## 2012-03-05 DIAGNOSIS — M79609 Pain in unspecified limb: Secondary | ICD-10-CM

## 2012-03-05 DIAGNOSIS — M7550 Bursitis of unspecified shoulder: Secondary | ICD-10-CM

## 2012-03-05 DIAGNOSIS — M25519 Pain in unspecified shoulder: Secondary | ICD-10-CM

## 2012-03-05 DIAGNOSIS — M546 Pain in thoracic spine: Secondary | ICD-10-CM

## 2012-03-05 DIAGNOSIS — R269 Unspecified abnormalities of gait and mobility: Secondary | ICD-10-CM

## 2012-03-05 DIAGNOSIS — G8929 Other chronic pain: Secondary | ICD-10-CM

## 2012-03-05 DIAGNOSIS — M25511 Pain in right shoulder: Secondary | ICD-10-CM

## 2012-03-05 DIAGNOSIS — M542 Cervicalgia: Secondary | ICD-10-CM

## 2012-03-05 DIAGNOSIS — M545 Low back pain, unspecified: Secondary | ICD-10-CM

## 2012-03-05 DIAGNOSIS — M75100 Unspecified rotator cuff tear or rupture of unspecified shoulder, not specified as traumatic: Secondary | ICD-10-CM

## 2012-03-05 MED ORDER — HYDROCODONE-ACETAMINOPHEN 10-325 MG PO TABS: 1.0000 | ORAL_TABLET | Freq: Three times a day (TID) | ORAL | Status: DC
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
## 2012-03-05 NOTE — Patient Instructions (Addendum)
Follow up in 3 months with Gage Weant Next 2 months with physician assistant  These keep your pain medications locked up and in a secure location    Joint Injection Care After Refer to this sheet in the next few days. These instructions provide you with information on caring for yourself after you have had a joint injection. Your caregiver also may give you more specific instructions. Your treatment has been planned according to current medical practices, but problems sometimes occur. Call your caregiver if you have any problems or questions after your procedure. After any type of joint injection, it is not uncommon to experience:  Soreness, swelling, or bruising around the injection site.   Mild numbness, tingling, or weakness around the injection site caused by the numbing medicine used before or with the injection.  It also is possible to experience the following effects associated with the specific agent after injection:  Iodine-based contrast agents:   Allergic reaction (itching, hives, widespread redness, and swelling beyond the injection site).   Corticosteroids (These effects are rare.):   Allergic reaction.   Increased blood sugar levels (If you have diabetes and you notice that your blood sugar levels have increased, notify your caregiver).   Increased blood pressure levels.   Mood swings.   Hyaluronic acid in the use of viscosupplementation.   Temporary heat or redness.   Temporary rash and itching.   Increased fluid accumulation in the injected joint.  These effects all should resolve within a day after your procedure.  HOME CARE INSTRUCTIONS  Limit yourself to light activity the day of your procedure. Avoid lifting heavy objects, bending, stooping, or twisting.   Take prescription or over-the-counter pain medication as directed by your caregiver.   You may apply ice to your injection site to reduce pain and swelling the day of your procedure. Ice may be  applied 3 to 4 times:   Put ice in a plastic bag.   Place a towel between your skin and the bag.   Leave the ice on for no longer than 15 to 20 minutes each time.  SEEK IMMEDIATE MEDICAL CARE IF:   Pain and swelling get worse rather than better or extend beyond the injection site.   Numbness does not go away.   Blood or fluid continues to leak from the injection site.   You have chest pain.   You have swelling of your face or tongue.   You have trouble breathing or you become dizzy.   You develop a fever, chills, or severe tenderness at the injection site that last longer than 1 day.  MAKE SURE YOU:  Understand these instructions.   Watch your condition.   Get help right away if you are not doing well or if you get worse.  Document Released: 05/08/2011 Document Revised: 08/14/2011 Document Reviewed: 05/08/2011 [facility] Patient Information 2012 [place], [place].

## 2012-03-17 ENCOUNTER — Ambulatory Visit: Payer: Medicaid Other | Admitting: Physical Medicine and Rehabilitation

## 2012-03-18 ENCOUNTER — Telehealth: Payer: Self-pay | Admitting: Physical Medicine and Rehabilitation

## 2012-03-18 NOTE — Telephone Encounter (Signed)
Patient left voicemail about a referral to a spine surgeon, patient stated they spoke about it at last visit.  The only referral that was in system was for physical therapy from last visit.

## 2012-03-18 NOTE — Telephone Encounter (Signed)
Notified Mrs Gaal on personally identified voicemail that we do have a note from last visit that Dr Pamelia Hoit wants her to bee seen by neurosurgeon but did not say who or make the referral, and we will have to wait until she is back on 03/29/12 to get that information from her. I will forward this message to Dr Leretha Dykes in basket.

## 2012-03-26 ENCOUNTER — Other Ambulatory Visit: Payer: Self-pay | Admitting: *Deleted

## 2012-03-26 MED ORDER — TRAZODONE HCL 100 MG PO TABS: 100.0000 mg | ORAL_TABLET | Freq: Every day | ORAL | Status: DC

## 2012-03-29 ENCOUNTER — Other Ambulatory Visit: Payer: Self-pay | Admitting: Physical Medicine and Rehabilitation

## 2012-03-29 NOTE — Telephone Encounter (Signed)
Wanted to clarify which surgeon she would like to see.  Referral to Neurosurgeon is planned,  May have had trouble ordering referral due to need for "external referral". I called patient at home. I tried to talk to patient but she was unavailable per family member.  I was told she would call back later.

## 2012-04-01 ENCOUNTER — Encounter
Payer: Medicaid Other | Attending: Physical Medicine and Rehabilitation | Admitting: Physical Medicine and Rehabilitation

## 2012-04-01 ENCOUNTER — Ambulatory Visit: Payer: Medicaid Other | Admitting: Physical Medicine and Rehabilitation

## 2012-04-01 ENCOUNTER — Encounter: Payer: Self-pay | Admitting: Physical Medicine and Rehabilitation

## 2012-04-01 VITALS — BP 174/88 | HR 93 | Resp 14 | Ht 65.0 in | Wt 245.0 lb

## 2012-04-01 DIAGNOSIS — Z9181 History of falling: Secondary | ICD-10-CM | POA: Insufficient documentation

## 2012-04-01 DIAGNOSIS — J449 Chronic obstructive pulmonary disease, unspecified: Secondary | ICD-10-CM | POA: Insufficient documentation

## 2012-04-01 DIAGNOSIS — M545 Low back pain, unspecified: Secondary | ICD-10-CM | POA: Insufficient documentation

## 2012-04-01 DIAGNOSIS — M546 Pain in thoracic spine: Secondary | ICD-10-CM | POA: Insufficient documentation

## 2012-04-01 DIAGNOSIS — Z8541 Personal history of malignant neoplasm of cervix uteri: Secondary | ICD-10-CM | POA: Insufficient documentation

## 2012-04-01 DIAGNOSIS — M810 Age-related osteoporosis without current pathological fracture: Secondary | ICD-10-CM | POA: Insufficient documentation

## 2012-04-01 DIAGNOSIS — G8929 Other chronic pain: Secondary | ICD-10-CM | POA: Insufficient documentation

## 2012-04-01 DIAGNOSIS — K219 Gastro-esophageal reflux disease without esophagitis: Secondary | ICD-10-CM | POA: Insufficient documentation

## 2012-04-01 DIAGNOSIS — J4489 Other specified chronic obstructive pulmonary disease: Secondary | ICD-10-CM | POA: Insufficient documentation

## 2012-04-01 DIAGNOSIS — M79609 Pain in unspecified limb: Secondary | ICD-10-CM | POA: Insufficient documentation

## 2012-04-01 DIAGNOSIS — Z8542 Personal history of malignant neoplasm of other parts of uterus: Secondary | ICD-10-CM | POA: Insufficient documentation

## 2012-04-01 DIAGNOSIS — G894 Chronic pain syndrome: Secondary | ICD-10-CM

## 2012-04-01 DIAGNOSIS — IMO0001 Reserved for inherently not codable concepts without codable children: Secondary | ICD-10-CM | POA: Insufficient documentation

## 2012-04-01 DIAGNOSIS — R29898 Other symptoms and signs involving the musculoskeletal system: Secondary | ICD-10-CM | POA: Insufficient documentation

## 2012-04-01 DIAGNOSIS — R209 Unspecified disturbances of skin sensation: Secondary | ICD-10-CM | POA: Insufficient documentation

## 2012-04-01 DIAGNOSIS — F17200 Nicotine dependence, unspecified, uncomplicated: Secondary | ICD-10-CM | POA: Insufficient documentation

## 2012-04-01 DIAGNOSIS — M25519 Pain in unspecified shoulder: Secondary | ICD-10-CM | POA: Insufficient documentation

## 2012-04-01 DIAGNOSIS — G4730 Sleep apnea, unspecified: Secondary | ICD-10-CM | POA: Insufficient documentation

## 2012-04-01 DIAGNOSIS — R269 Unspecified abnormalities of gait and mobility: Secondary | ICD-10-CM | POA: Insufficient documentation

## 2012-04-01 MED ORDER — HYDROCODONE-ACETAMINOPHEN 10-325 MG PO TABS: 1.0000 | ORAL_TABLET | Freq: Three times a day (TID) | ORAL | Status: DC

## 2012-04-01 NOTE — Patient Instructions (Signed)
Continue with meditation, continue with exercising.
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
## 2012-04-26 ENCOUNTER — Encounter: Payer: Self-pay | Admitting: Physical Medicine and Rehabilitation

## 2012-04-26 ENCOUNTER — Other Ambulatory Visit: Payer: Self-pay | Admitting: Physical Medicine and Rehabilitation

## 2012-04-26 ENCOUNTER — Encounter
Payer: Medicaid Other | Attending: Physical Medicine and Rehabilitation | Admitting: Physical Medicine and Rehabilitation

## 2012-04-26 VITALS — BP 150/75 | HR 109 | Resp 14 | Ht 65.0 in | Wt 245.0 lb

## 2012-04-26 DIAGNOSIS — M542 Cervicalgia: Secondary | ICD-10-CM

## 2012-04-26 DIAGNOSIS — R29898 Other symptoms and signs involving the musculoskeletal system: Secondary | ICD-10-CM

## 2012-04-26 DIAGNOSIS — G8929 Other chronic pain: Secondary | ICD-10-CM

## 2012-04-26 DIAGNOSIS — M545 Low back pain, unspecified: Secondary | ICD-10-CM

## 2012-04-26 DIAGNOSIS — M79609 Pain in unspecified limb: Secondary | ICD-10-CM

## 2012-04-26 DIAGNOSIS — R269 Unspecified abnormalities of gait and mobility: Secondary | ICD-10-CM

## 2012-04-26 DIAGNOSIS — M546 Pain in thoracic spine: Secondary | ICD-10-CM

## 2012-04-26 MED ORDER — HYDROCODONE-ACETAMINOPHEN 10-325 MG PO TABS: 1.0000 | ORAL_TABLET | Freq: Three times a day (TID) | ORAL | Status: DC

## 2012-04-26 NOTE — Patient Instructions (Addendum)
   I have refilled your Vicodin.  You have refills left on gabapentin and trazodone  Please make sure you keep your pain medications locked up and in a secure location.  My office will set you up for a left hip injection using ultrasound guidance.  You have been seen at neurosurgeon in the past here in [place]. I understand you would like to see another neurosurgeon.  I have called [facility] hospital and talk to neurosurgeon Dr. Raynald Kemp.  I have reviewed MRI results with him from your low back as well as your mid back..  We will faxed over previous records and MRI and x-ray reports to [facility].
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
## 2012-04-27 ENCOUNTER — Telehealth: Payer: Self-pay | Admitting: Physical Medicine and Rehabilitation

## 2012-04-27 NOTE — Telephone Encounter (Signed)
Dr. Lucretia Kern office has patient set up for Monday Sept. 23 at 1:00

## 2012-05-07 ENCOUNTER — Encounter: Payer: Self-pay | Admitting: Physical Medicine and Rehabilitation

## 2012-05-07 ENCOUNTER — Encounter (HOSPITAL_BASED_OUTPATIENT_CLINIC_OR_DEPARTMENT_OTHER): Payer: Medicaid Other | Admitting: Physical Medicine and Rehabilitation

## 2012-05-07 VITALS — BP 160/73 | HR 92 | Resp 14 | Ht 65.0 in | Wt 245.0 lb

## 2012-05-07 DIAGNOSIS — M7062 Trochanteric bursitis, left hip: Secondary | ICD-10-CM

## 2012-05-07 DIAGNOSIS — G8929 Other chronic pain: Secondary | ICD-10-CM

## 2012-05-07 DIAGNOSIS — M79609 Pain in unspecified limb: Secondary | ICD-10-CM

## 2012-05-07 NOTE — Progress Notes (Signed)
Patient: Left Hip pain not relieved by medical management and other conservative care  Informed consent was obtained after describing risks and benefits of the procedure with the patient,, this includes bleeding, bruising, infection and medication side effects.  The patient wishes to proceed and has given written consent  The patient was placed in a lateral decubitus position. The lateral aspect of the greater trochanter was marked and prepped of dated 9 alcohol. A 21-gauge needles was inserted into the lateral hip (greater trochanter area).   After negative jaw back for blood, solution containing one milligram of 40 mg per milliliter kenalog and 4 milliliters of 1% lidocaine were injected. Patient tolerated the procedure well  post  injections instructions were given.

## 2012-05-07 NOTE — Patient Instructions (Signed)
Joint Injection Care After Refer to this sheet in the next few days. These instructions provide you with information on caring for yourself after you have had a joint injection. Your caregiver also may give you more specific instructions. Your treatment has been planned according to current medical practices, but problems sometimes occur. Call your caregiver if you have any problems or questions after your procedure. After any type of joint injection, it is not uncommon to experience:  Soreness, swelling, or bruising around the injection site.   Mild numbness, tingling, or weakness around the injection site caused by the numbing medicine used before or with the injection.  It also is possible to experience the following effects associated with the specific agent after injection:  Iodine-based contrast agents:   Allergic reaction (itching, hives, widespread redness, and swelling beyond the injection site).   Corticosteroids (These effects are rare.):   Allergic reaction.   Increased blood sugar levels (If you have diabetes and you notice that your blood sugar levels have increased, notify your caregiver).   Increased blood pressure levels.   Mood swings.   Hyaluronic acid in the use of viscosupplementation.   Temporary heat or redness.   Temporary rash and itching.   Increased fluid accumulation in the injected joint.  These effects all should resolve within a day after your procedure.  HOME CARE INSTRUCTIONS  Limit yourself to light activity the day of your procedure. Avoid lifting heavy objects, bending, stooping, or twisting.   Take prescription or over-the-counter pain medication as directed by your caregiver.   You may apply ice to your injection site to reduce pain and swelling the day of your procedure. Ice may be applied 3 to 4 times:   Put ice in a plastic bag.   Place a towel between your skin and the bag.   Leave the ice on for no longer than 15 to 20 minutes  each time.  SEEK IMMEDIATE MEDICAL CARE IF:   Pain and swelling get worse rather than better or extend beyond the injection site.   Numbness does not go away.   Blood or fluid continues to leak from the injection site.   You have chest pain.   You have swelling of your face or tongue.   You have trouble breathing or you become dizzy.   You develop a fever, chills, or severe tenderness at the injection site that last longer than 1 day.  MAKE SURE YOU:  Understand these instructions.   Watch your condition.   Get help right away if you are not doing well or if you get worse.  Document Released: 05/08/2011 Document Revised: 08/14/2011 Document Reviewed: 05/08/2011 [employer] Patient Information 2012 [employer]. 

## 2012-06-14 ENCOUNTER — Encounter
Payer: Medicaid Other | Attending: Physical Medicine and Rehabilitation | Admitting: Physical Medicine and Rehabilitation

## 2012-06-14 ENCOUNTER — Encounter: Payer: Self-pay | Admitting: Physical Medicine and Rehabilitation

## 2012-06-14 VITALS — BP 127/66 | HR 89 | Resp 14 | Ht 65.0 in | Wt 245.0 lb

## 2012-06-14 DIAGNOSIS — M542 Cervicalgia: Secondary | ICD-10-CM | POA: Insufficient documentation

## 2012-06-14 DIAGNOSIS — M25512 Pain in left shoulder: Secondary | ICD-10-CM

## 2012-06-14 DIAGNOSIS — M545 Low back pain, unspecified: Secondary | ICD-10-CM | POA: Insufficient documentation

## 2012-06-14 DIAGNOSIS — M79609 Pain in unspecified limb: Secondary | ICD-10-CM | POA: Insufficient documentation

## 2012-06-14 DIAGNOSIS — M25511 Pain in right shoulder: Secondary | ICD-10-CM

## 2012-06-14 DIAGNOSIS — R269 Unspecified abnormalities of gait and mobility: Secondary | ICD-10-CM | POA: Insufficient documentation

## 2012-06-14 DIAGNOSIS — M546 Pain in thoracic spine: Secondary | ICD-10-CM

## 2012-06-14 DIAGNOSIS — M25519 Pain in unspecified shoulder: Secondary | ICD-10-CM

## 2012-06-14 DIAGNOSIS — M79606 Pain in leg, unspecified: Secondary | ICD-10-CM

## 2012-06-14 DIAGNOSIS — R29898 Other symptoms and signs involving the musculoskeletal system: Secondary | ICD-10-CM | POA: Insufficient documentation

## 2012-06-14 DIAGNOSIS — G8929 Other chronic pain: Secondary | ICD-10-CM | POA: Insufficient documentation

## 2012-06-14 MED ORDER — HYDROCODONE-ACETAMINOPHEN 10-325 MG PO TABS: 1.0000 | ORAL_TABLET | Freq: Three times a day (TID) | ORAL | Status: DC
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
## 2012-06-14 NOTE — Patient Instructions (Addendum)
Please maintain contact with primary care.  I have refilled your pain medications today. Please make sure you keep them locked up and in a safe location. Take them as prescribed.  I will see you back in one month.

## 2012-06-15 ENCOUNTER — Other Ambulatory Visit: Payer: Self-pay | Admitting: *Deleted

## 2012-06-15 MED ORDER — GABAPENTIN 300 MG PO CAPS: 300.0000 mg | ORAL_CAPSULE | Freq: Four times a day (QID) | ORAL | Status: DC

## 2012-06-17 ENCOUNTER — Other Ambulatory Visit: Payer: Self-pay | Admitting: Physician Assistant

## 2012-06-17 DIAGNOSIS — Z139 Encounter for screening, unspecified: Secondary | ICD-10-CM

## 2012-07-12 ENCOUNTER — Encounter: Payer: Self-pay | Admitting: Physical Medicine and Rehabilitation

## 2012-07-12 ENCOUNTER — Encounter
Payer: Medicaid Other | Attending: Physical Medicine and Rehabilitation | Admitting: Physical Medicine and Rehabilitation

## 2012-07-12 VITALS — BP 141/68 | HR 104 | Resp 14 | Ht 65.0 in | Wt 245.0 lb

## 2012-07-12 DIAGNOSIS — M545 Low back pain, unspecified: Secondary | ICD-10-CM

## 2012-07-12 DIAGNOSIS — Z760 Encounter for issue of repeat prescription: Secondary | ICD-10-CM

## 2012-07-12 DIAGNOSIS — R269 Unspecified abnormalities of gait and mobility: Secondary | ICD-10-CM

## 2012-07-12 DIAGNOSIS — M546 Pain in thoracic spine: Secondary | ICD-10-CM | POA: Insufficient documentation

## 2012-07-12 DIAGNOSIS — M25519 Pain in unspecified shoulder: Secondary | ICD-10-CM

## 2012-07-12 DIAGNOSIS — R29898 Other symptoms and signs involving the musculoskeletal system: Secondary | ICD-10-CM | POA: Insufficient documentation

## 2012-07-12 DIAGNOSIS — M25511 Pain in right shoulder: Secondary | ICD-10-CM

## 2012-07-12 DIAGNOSIS — M79609 Pain in unspecified limb: Secondary | ICD-10-CM

## 2012-07-12 DIAGNOSIS — M25512 Pain in left shoulder: Secondary | ICD-10-CM

## 2012-07-12 DIAGNOSIS — M542 Cervicalgia: Secondary | ICD-10-CM | POA: Insufficient documentation

## 2012-07-12 DIAGNOSIS — G8929 Other chronic pain: Secondary | ICD-10-CM

## 2012-07-12 MED ORDER — HYDROCODONE-ACETAMINOPHEN 10-325 MG PO TABS: 1.0000 | ORAL_TABLET | Freq: Three times a day (TID) | ORAL | Status: DC
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
## 2012-07-12 NOTE — Patient Instructions (Addendum)
Please keep your pain medications locked up and in a secure location.  We have discussed consideration of physical therapy again to help with mobility and pain management techniques and I understand you would like to defer this.  We also talked about seeing someone or depression such as a counselor, but I understand you would like to hold off on this.  I will see you back in one month.

## 2012-07-26 ENCOUNTER — Ambulatory Visit (HOSPITAL_COMMUNITY): Payer: Medicaid Other

## 2012-08-02 ENCOUNTER — Ambulatory Visit (HOSPITAL_COMMUNITY): Payer: Medicaid Other

## 2012-08-09 ENCOUNTER — Ambulatory Visit (HOSPITAL_COMMUNITY): Payer: Medicaid Other

## 2012-08-09 ENCOUNTER — Other Ambulatory Visit: Payer: Self-pay | Admitting: *Deleted

## 2012-08-09 MED ORDER — TRAZODONE HCL 100 MG PO TABS: 100.0000 mg | ORAL_TABLET | Freq: Every day | ORAL | Status: DC

## 2012-08-09 MED ORDER — HYDROCODONE-ACETAMINOPHEN 10-325 MG PO TABS: 1.0000 | ORAL_TABLET | Freq: Three times a day (TID) | ORAL | Status: DC

## 2012-08-11 ENCOUNTER — Encounter: Payer: Medicaid Other | Admitting: Physical Medicine and Rehabilitation

## 2012-08-13 ENCOUNTER — Encounter
Payer: Medicaid Other | Attending: Physical Medicine and Rehabilitation | Admitting: Physical Medicine and Rehabilitation

## 2012-08-13 ENCOUNTER — Encounter: Payer: Self-pay | Admitting: Physical Medicine and Rehabilitation

## 2012-08-13 VITALS — BP 138/83 | HR 95 | Resp 16 | Ht 65.0 in | Wt 245.0 lb

## 2012-08-13 DIAGNOSIS — G894 Chronic pain syndrome: Secondary | ICD-10-CM

## 2012-08-13 DIAGNOSIS — M545 Low back pain, unspecified: Secondary | ICD-10-CM | POA: Insufficient documentation

## 2012-08-13 DIAGNOSIS — R29898 Other symptoms and signs involving the musculoskeletal system: Secondary | ICD-10-CM | POA: Insufficient documentation

## 2012-08-13 DIAGNOSIS — G8929 Other chronic pain: Secondary | ICD-10-CM

## 2012-08-13 DIAGNOSIS — M25519 Pain in unspecified shoulder: Secondary | ICD-10-CM | POA: Insufficient documentation

## 2012-08-13 DIAGNOSIS — M79609 Pain in unspecified limb: Secondary | ICD-10-CM | POA: Insufficient documentation

## 2012-08-13 DIAGNOSIS — R269 Unspecified abnormalities of gait and mobility: Secondary | ICD-10-CM | POA: Insufficient documentation

## 2012-08-13 NOTE — Patient Instructions (Signed)
Continue with your recumbant exercises.
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
## 2012-08-16 ENCOUNTER — Ambulatory Visit (HOSPITAL_COMMUNITY)
Admission: RE | Admit: 2012-08-16 | Discharge: 2012-08-16 | Disposition: A | Discharge status: Home / Self Care | Payer: Medicaid Other | Source: Ambulatory Visit | Attending: Physician Assistant | Admitting: Physician Assistant

## 2012-08-16 DIAGNOSIS — Z139 Encounter for screening, unspecified: Secondary | ICD-10-CM

## 2012-08-16 DIAGNOSIS — Z1231 Encounter for screening mammogram for malignant neoplasm of breast: Secondary | ICD-10-CM | POA: Insufficient documentation

## 2012-09-13 ENCOUNTER — Encounter: Payer: Self-pay | Admitting: Physical Medicine and Rehabilitation

## 2012-09-13 ENCOUNTER — Encounter
Payer: Medicaid Other | Attending: Physical Medicine and Rehabilitation | Admitting: Physical Medicine and Rehabilitation

## 2012-09-13 VITALS — BP 148/71 | HR 99 | Resp 16 | Ht 65.0 in | Wt 245.0 lb

## 2012-09-13 DIAGNOSIS — M545 Low back pain, unspecified: Secondary | ICD-10-CM | POA: Insufficient documentation

## 2012-09-13 DIAGNOSIS — Z5181 Encounter for therapeutic drug level monitoring: Secondary | ICD-10-CM

## 2012-09-13 DIAGNOSIS — G8929 Other chronic pain: Secondary | ICD-10-CM | POA: Insufficient documentation

## 2012-09-13 DIAGNOSIS — R269 Unspecified abnormalities of gait and mobility: Secondary | ICD-10-CM | POA: Insufficient documentation

## 2012-09-13 DIAGNOSIS — M549 Dorsalgia, unspecified: Secondary | ICD-10-CM

## 2012-09-13 DIAGNOSIS — M25519 Pain in unspecified shoulder: Secondary | ICD-10-CM | POA: Insufficient documentation

## 2012-09-13 DIAGNOSIS — M79609 Pain in unspecified limb: Secondary | ICD-10-CM | POA: Insufficient documentation

## 2012-09-13 DIAGNOSIS — IMO0002 Reserved for concepts with insufficient information to code with codable children: Secondary | ICD-10-CM | POA: Insufficient documentation

## 2012-09-13 DIAGNOSIS — R29898 Other symptoms and signs involving the musculoskeletal system: Secondary | ICD-10-CM | POA: Insufficient documentation

## 2012-09-13 MED ORDER — HYDROCODONE-ACETAMINOPHEN 10-325 MG PO TABS: 1.0000 | ORAL_TABLET | Freq: Three times a day (TID) | ORAL | Status: DC

## 2012-09-13 NOTE — Patient Instructions (Signed)
Try to stay as active as possible, continue with your exercise program in a sitting or lying position.
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
## 2012-10-13 ENCOUNTER — Encounter
Payer: Medicaid Other | Attending: Physical Medicine and Rehabilitation | Admitting: Physical Medicine and Rehabilitation

## 2012-10-13 ENCOUNTER — Encounter: Payer: Medicaid Other | Admitting: Physical Medicine and Rehabilitation

## 2012-10-13 ENCOUNTER — Encounter: Payer: Self-pay | Admitting: Physical Medicine and Rehabilitation

## 2012-10-13 VITALS — BP 155/83 | HR 99 | Resp 14 | Ht 65.0 in | Wt 245.0 lb

## 2012-10-13 DIAGNOSIS — M25519 Pain in unspecified shoulder: Secondary | ICD-10-CM | POA: Insufficient documentation

## 2012-10-13 DIAGNOSIS — G8929 Other chronic pain: Secondary | ICD-10-CM | POA: Insufficient documentation

## 2012-10-13 DIAGNOSIS — M79609 Pain in unspecified limb: Secondary | ICD-10-CM | POA: Insufficient documentation

## 2012-10-13 DIAGNOSIS — M47817 Spondylosis without myelopathy or radiculopathy, lumbosacral region: Secondary | ICD-10-CM

## 2012-10-13 DIAGNOSIS — R269 Unspecified abnormalities of gait and mobility: Secondary | ICD-10-CM | POA: Insufficient documentation

## 2012-10-13 DIAGNOSIS — R29898 Other symptoms and signs involving the musculoskeletal system: Secondary | ICD-10-CM | POA: Insufficient documentation

## 2012-10-13 DIAGNOSIS — M47816 Spondylosis without myelopathy or radiculopathy, lumbar region: Secondary | ICD-10-CM

## 2012-10-13 DIAGNOSIS — M545 Low back pain, unspecified: Secondary | ICD-10-CM | POA: Insufficient documentation

## 2012-10-13 DIAGNOSIS — M76899 Other specified enthesopathies of unspecified lower limb, excluding foot: Secondary | ICD-10-CM | POA: Insufficient documentation

## 2012-10-13 MED ORDER — HYDROCODONE-ACETAMINOPHEN 10-325 MG PO TABS: 1.0000 | ORAL_TABLET | Freq: Three times a day (TID) | ORAL | Status: DC

## 2012-10-13 MED ORDER — DICLOFENAC SODIUM 1 % TD GEL: 2.0000 g | Freq: Four times a day (QID) | TRANSDERMAL | Status: DC

## 2012-10-13 NOTE — Patient Instructions (Signed)
Apply ice to your left trochanteric bursa for about 2 days after the injection.
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
## 2012-10-18 ENCOUNTER — Telehealth: Payer: Self-pay

## 2012-10-18 NOTE — Telephone Encounter (Signed)
Left message for patient to call office and give specifics as to what kind of mattress and size and any other details so it can be ordered.

## 2012-10-18 NOTE — Telephone Encounter (Signed)
I need to know what kind she needs, and do we address this to [facility] for evaluation ?

## 2012-10-18 NOTE — Telephone Encounter (Signed)
Patient called saying she needs a order for a new mattress sent to [facility].  Please place order.

## 2012-10-19 NOTE — Telephone Encounter (Signed)
Patient says the mattress is a standard XL twin mattress.

## 2012-10-26 ENCOUNTER — Telehealth: Payer: Self-pay | Admitting: Physical Medicine and Rehabilitation

## 2012-10-26 NOTE — Telephone Encounter (Signed)
Mattress covering hospital bed is in bad shape, and she falls out of it at night, due to sagging. [facility] has new mattress ready for her, but needs an order from doctor.  Fax order to [facility].

## 2012-10-27 ENCOUNTER — Telehealth: Payer: Self-pay

## 2012-10-27 MED ORDER — GABAPENTIN 300 MG PO CAPS: 300.0000 mg | ORAL_CAPSULE | Freq: Four times a day (QID) | ORAL | Status: DC

## 2012-10-27 NOTE — Telephone Encounter (Signed)
Gabapentin refilled

## 2012-10-28 NOTE — Telephone Encounter (Signed)
Wrote order, gave Sherron Monday to fax tp [facility]

## 2012-11-07 ENCOUNTER — Encounter: Payer: Self-pay | Admitting: *Deleted

## 2012-11-09 ENCOUNTER — Encounter: Payer: Medicaid Other | Admitting: Physical Medicine and Rehabilitation

## 2012-11-09 ENCOUNTER — Telehealth: Payer: Self-pay

## 2012-11-09 MED ORDER — HYDROCODONE-ACETAMINOPHEN 10-325 MG PO TABS: 1.0000 | ORAL_TABLET | Freq: Three times a day (TID) | ORAL | Status: DC

## 2012-11-09 NOTE — Telephone Encounter (Signed)
Patient had to reschedule appointment due to weather and she needs her medication refilled.  Hydrocodone called in.  Left message making patient aware.

## 2012-11-12 ENCOUNTER — Ambulatory Visit: Payer: Medicaid Other | Admitting: Physical Medicine and Rehabilitation

## 2012-12-08 ENCOUNTER — Telehealth: Payer: Self-pay | Admitting: Physician Assistant

## 2012-12-09 ENCOUNTER — Encounter: Payer: Self-pay | Admitting: Physical Medicine and Rehabilitation

## 2012-12-09 ENCOUNTER — Encounter
Payer: Medicaid Other | Attending: Physical Medicine and Rehabilitation | Admitting: Physical Medicine and Rehabilitation

## 2012-12-09 VITALS — BP 132/44 | HR 89 | Resp 14 | Ht 65.0 in | Wt 245.0 lb

## 2012-12-09 DIAGNOSIS — M47814 Spondylosis without myelopathy or radiculopathy, thoracic region: Secondary | ICD-10-CM

## 2012-12-09 DIAGNOSIS — R29898 Other symptoms and signs involving the musculoskeletal system: Secondary | ICD-10-CM | POA: Insufficient documentation

## 2012-12-09 DIAGNOSIS — R269 Unspecified abnormalities of gait and mobility: Secondary | ICD-10-CM | POA: Insufficient documentation

## 2012-12-09 DIAGNOSIS — M545 Low back pain, unspecified: Secondary | ICD-10-CM | POA: Insufficient documentation

## 2012-12-09 DIAGNOSIS — M47817 Spondylosis without myelopathy or radiculopathy, lumbosacral region: Secondary | ICD-10-CM

## 2012-12-09 DIAGNOSIS — M79609 Pain in unspecified limb: Secondary | ICD-10-CM | POA: Insufficient documentation

## 2012-12-09 DIAGNOSIS — IMO0002 Reserved for concepts with insufficient information to code with codable children: Secondary | ICD-10-CM | POA: Insufficient documentation

## 2012-12-09 DIAGNOSIS — M25519 Pain in unspecified shoulder: Secondary | ICD-10-CM | POA: Insufficient documentation

## 2012-12-09 DIAGNOSIS — G8929 Other chronic pain: Secondary | ICD-10-CM | POA: Insufficient documentation

## 2012-12-09 MED ORDER — HYDROCODONE-ACETAMINOPHEN 10-325 MG PO TABS: 1.0000 | ORAL_TABLET | Freq: Three times a day (TID) | ORAL | Status: DC

## 2012-12-09 NOTE — Telephone Encounter (Signed)
Tell her the med that mcd wants to substitute is not the same type of med. Recommend that she simply stop the Astelin. Just use her other allergy meds (not sure if on zytec or allegra or claritan etc-but would just cont this.

## 2012-12-09 NOTE — Patient Instructions (Addendum)
Continue with your exercise program, try to eat a healthy diet .

## 2012-12-09 NOTE — Telephone Encounter (Signed)
Called pt home.  Left mess to stop Astelin nasal spray altogether.  Provider does not want to replace with recommended substitutuion.  See how she does and call us back in 2-3 weeks if feel this is not working.
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
## 2012-12-10 ENCOUNTER — Telehealth: Payer: Self-pay | Admitting: Family Medicine

## 2012-12-10 NOTE — Telephone Encounter (Signed)
Pharmacy contacted [place] about Astelin nasal spray no longer covered by [employer].  Provider has decided not to replace with substitution and D/C astelin altogether.  Pharmacy and patient have been notified

## 2012-12-31 ENCOUNTER — Telehealth: Payer: Self-pay | Admitting: Family Medicine

## 2012-12-31 MED ORDER — TRIAMCINOLONE ACETONIDE 0.1 % EX CREA: TOPICAL_CREAM | Freq: Two times a day (BID) | CUTANEOUS | Status: DC

## 2012-12-31 NOTE — Telephone Encounter (Signed)
Medication refilled per protocol. 

## 2012-12-31 NOTE — Telephone Encounter (Signed)
This is a [facility] pt

## 2013-01-05 ENCOUNTER — Encounter (HOSPITAL_BASED_OUTPATIENT_CLINIC_OR_DEPARTMENT_OTHER): Payer: Medicaid Other | Admitting: Physical Medicine and Rehabilitation

## 2013-01-05 ENCOUNTER — Encounter: Payer: Self-pay | Admitting: Physical Medicine and Rehabilitation

## 2013-01-05 ENCOUNTER — Telehealth: Payer: Self-pay | Admitting: Physician Assistant

## 2013-01-05 VITALS — BP 147/85 | HR 95 | Resp 14 | Ht 65.0 in | Wt 245.0 lb

## 2013-01-05 DIAGNOSIS — J309 Allergic rhinitis, unspecified: Secondary | ICD-10-CM

## 2013-01-05 DIAGNOSIS — Z5181 Encounter for therapeutic drug level monitoring: Secondary | ICD-10-CM

## 2013-01-05 MED ORDER — AZELASTINE HCL 0.1 % NA SOLN: 2.0000 | Freq: Two times a day (BID) | NASAL | Status: DC

## 2013-01-05 MED ORDER — HYDROCODONE-ACETAMINOPHEN 10-325 MG PO TABS: 1.0000 | ORAL_TABLET | Freq: Three times a day (TID) | ORAL | Status: DC

## 2013-01-05 NOTE — Patient Instructions (Signed)
Keep your pain meds locked up and in a secure location.  Take your medications as prescribed  F/u in one month

## 2013-01-05 NOTE — Telephone Encounter (Signed)
Rx sent as prescribed by provider

## 2013-01-05 NOTE — Telephone Encounter (Signed)
Approved for Astepro-as directed on other screen. # 1 / prn refills
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
## 2013-01-07 ENCOUNTER — Ambulatory Visit: Payer: Medicaid Other | Admitting: Physical Medicine and Rehabilitation

## 2013-02-02 ENCOUNTER — Encounter
Payer: Medicaid Other | Attending: Physical Medicine and Rehabilitation | Admitting: Physical Medicine and Rehabilitation

## 2013-02-02 ENCOUNTER — Encounter: Payer: Self-pay | Admitting: Physical Medicine and Rehabilitation

## 2013-02-02 VITALS — BP 130/67 | HR 106 | Resp 14 | Ht 65.0 in | Wt 245.0 lb

## 2013-02-02 DIAGNOSIS — IMO0002 Reserved for concepts with insufficient information to code with codable children: Secondary | ICD-10-CM | POA: Insufficient documentation

## 2013-02-02 DIAGNOSIS — M79609 Pain in unspecified limb: Secondary | ICD-10-CM | POA: Insufficient documentation

## 2013-02-02 DIAGNOSIS — M545 Low back pain, unspecified: Secondary | ICD-10-CM | POA: Insufficient documentation

## 2013-02-02 DIAGNOSIS — G8929 Other chronic pain: Secondary | ICD-10-CM | POA: Insufficient documentation

## 2013-02-02 DIAGNOSIS — M546 Pain in thoracic spine: Secondary | ICD-10-CM

## 2013-02-02 DIAGNOSIS — R29898 Other symptoms and signs involving the musculoskeletal system: Secondary | ICD-10-CM | POA: Insufficient documentation

## 2013-02-02 DIAGNOSIS — M79601 Pain in right arm: Secondary | ICD-10-CM

## 2013-02-02 DIAGNOSIS — M25511 Pain in right shoulder: Secondary | ICD-10-CM

## 2013-02-02 DIAGNOSIS — M25519 Pain in unspecified shoulder: Secondary | ICD-10-CM | POA: Insufficient documentation

## 2013-02-02 DIAGNOSIS — M542 Cervicalgia: Secondary | ICD-10-CM

## 2013-02-02 DIAGNOSIS — R269 Unspecified abnormalities of gait and mobility: Secondary | ICD-10-CM

## 2013-02-02 DIAGNOSIS — M25512 Pain in left shoulder: Secondary | ICD-10-CM

## 2013-02-02 MED ORDER — GABAPENTIN 300 MG PO CAPS: ORAL_CAPSULE | ORAL | Status: DC

## 2013-02-02 MED ORDER — GABAPENTIN 300 MG PO CAPS: 300.0000 mg | ORAL_CAPSULE | Freq: Four times a day (QID) | ORAL | Status: DC

## 2013-02-02 MED ORDER — HYDROCODONE-ACETAMINOPHEN 10-325 MG PO TABS: 1.0000 | ORAL_TABLET | Freq: Three times a day (TID) | ORAL | Status: DC

## 2013-02-02 NOTE — Patient Instructions (Signed)
Range of motion in your left shoulder is limited and painful.  I would like you to start using Voltaren gel 4 times a day.  Consider injection to left shoulder at next visit.  I have refilled your medications  Please keep your pain medications locked up and in a secure location  Followup one month
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
## 2013-02-02 NOTE — Addendum Note (Signed)
Addended by: Ashok Cordia on: 02/02/2013 03:49 PM   Modules accepted: Orders, Medications

## 2013-02-17 ENCOUNTER — Telehealth: Payer: Self-pay | Admitting: Family Medicine

## 2013-02-17 MED ORDER — CIPROFLOXACIN HCL 500 MG PO TABS: 500.0000 mg | ORAL_TABLET | Freq: Two times a day (BID) | ORAL | Status: DC

## 2013-02-17 NOTE — Telephone Encounter (Signed)
done

## 2013-02-17 NOTE — Telephone Encounter (Signed)
cipro 500mg po bid for 5 days.

## 2013-03-01 ENCOUNTER — Encounter: Payer: Self-pay | Admitting: Internal Medicine

## 2013-03-02 ENCOUNTER — Ambulatory Visit (INDEPENDENT_AMBULATORY_CARE_PROVIDER_SITE_OTHER): Payer: Medicaid Other | Admitting: Gastroenterology

## 2013-03-02 ENCOUNTER — Encounter: Payer: Self-pay | Admitting: Gastroenterology

## 2013-03-02 VITALS — BP 140/81 | HR 97 | Temp 98.4°F | Ht 65.0 in | Wt 241.0 lb

## 2013-03-02 DIAGNOSIS — R197 Diarrhea, unspecified: Secondary | ICD-10-CM

## 2013-03-02 DIAGNOSIS — R1011 Right upper quadrant pain: Secondary | ICD-10-CM

## 2013-03-02 DIAGNOSIS — G8929 Other chronic pain: Secondary | ICD-10-CM

## 2013-03-02 DIAGNOSIS — K219 Gastro-esophageal reflux disease without esophagitis: Secondary | ICD-10-CM

## 2013-03-02 HISTORY — DX: Diarrhea, unspecified: R19.7

## 2013-03-02 MED ORDER — DICYCLOMINE HCL 10 MG PO CAPS: ORAL_CAPSULE | ORAL | Status: DC

## 2013-03-02 NOTE — Patient Instructions (Addendum)
1. Please collect stool for testing. 2. Please have your blood work done. 3. Start dicyclomine 1 capsule up to 4 times daily for diarrhea related to irritable bowel syndrome. Hold for constipation. 4. Please be aware of anything that worsens it your right-sided abdominal pain. If aggravated by meals or bowel movements please let me know.

## 2013-03-02 NOTE — Assessment & Plan Note (Signed)
Previous constipation predominant irritable bowel syndrome. Now with 3 months of diarrhea occurring for personally every other day. She has several bowel movements 4-5 in the early morning that is fine throughout the day until symptoms recur. No regular bowel movements at this point. No blood in stool, abdominal pain. No recent medication changes. We will check stool studies, TSH, celiac labs. Start Bentyl prn. Looking back through her records she was anemic before therefore recheck her CBC. CMET for abdominal pain. She will notice aggravating factors regarding abdominal pain. If symptoms related to meals or BMs, she will let me know. I suspect musculoskeletal in nature.   If work-up negative and she does not respond to Bentyl, she may need colonoscopy with random colon biopsy to rule out microscopic colitis.
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
## 2013-03-02 NOTE — Assessment & Plan Note (Signed)
Well controlled on Nexium twice a day 

## 2013-03-02 NOTE — Progress Notes (Signed)
CC PCP 

## 2013-03-03 ENCOUNTER — Other Ambulatory Visit: Payer: Self-pay | Admitting: Family Medicine

## 2013-03-03 MED ORDER — CETIRIZINE HCL 10 MG PO TABS: 10.0000 mg | ORAL_TABLET | Freq: Every day | ORAL | Status: DC

## 2013-03-03 MED ORDER — GLUCOSE BLOOD VI STRP: ORAL_STRIP | Status: DC

## 2013-03-03 MED ORDER — ESCITALOPRAM OXALATE 20 MG PO TABS: 20.0000 mg | ORAL_TABLET | Freq: Every day | ORAL | Status: DC

## 2013-03-03 MED ORDER — METFORMIN HCL 1000 MG PO TABS: 1000.0000 mg | ORAL_TABLET | Freq: Two times a day (BID) | ORAL | Status: DC

## 2013-03-03 MED ORDER — MONTELUKAST SODIUM 10 MG PO TABS: 10.0000 mg | ORAL_TABLET | Freq: Every day | ORAL | Status: DC

## 2013-03-03 MED ORDER — FLUTICASONE-SALMETEROL 250-50 MCG/DOSE IN AEPB: 1.0000 | INHALATION_SPRAY | Freq: Two times a day (BID) | RESPIRATORY_TRACT | Status: DC

## 2013-03-03 MED ORDER — ESOMEPRAZOLE MAGNESIUM 40 MG PO CPDR: 40.0000 mg | DELAYED_RELEASE_CAPSULE | Freq: Two times a day (BID) | ORAL | Status: DC

## 2013-03-03 MED ORDER — ACYCLOVIR 400 MG PO TABS: 400.0000 mg | ORAL_TABLET | Freq: Every day | ORAL | Status: DC

## 2013-03-03 MED ORDER — ALBUTEROL SULFATE HFA 108 (90 BASE) MCG/ACT IN AERS: 2.0000 | INHALATION_SPRAY | Freq: Four times a day (QID) | RESPIRATORY_TRACT | Status: DC | PRN

## 2013-03-03 MED ORDER — NIACIN ER (ANTIHYPERLIPIDEMIC) 500 MG PO TBCR: 500.0000 mg | EXTENDED_RELEASE_TABLET | Freq: Every day | ORAL | Status: DC

## 2013-03-03 MED ORDER — CHOLINE FENOFIBRATE 135 MG PO CPDR: 135.0000 mg | DELAYED_RELEASE_CAPSULE | Freq: Every day | ORAL | Status: DC

## 2013-03-03 MED ORDER — CALCITRIOL 0.25 MCG PO CAPS: 0.2500 ug | ORAL_CAPSULE | Freq: Every day | ORAL | Status: DC

## 2013-03-03 MED ORDER — FUROSEMIDE 20 MG PO TABS: 20.0000 mg | ORAL_TABLET | Freq: Every day | ORAL | Status: DC

## 2013-03-03 MED ORDER — ACCU-CHEK FASTCLIX LANCETS MISC: 1.0000 | Freq: Every day | Status: DC

## 2013-03-03 MED ORDER — LEVOTHYROXINE SODIUM 125 MCG PO TABS: 125.0000 ug | ORAL_TABLET | Freq: Every day | ORAL | Status: DC

## 2013-03-03 MED ORDER — ROSUVASTATIN CALCIUM 20 MG PO TABS: 20.0000 mg | ORAL_TABLET | Freq: Every day | ORAL | Status: DC

## 2013-03-03 MED ORDER — IPRATROPIUM-ALBUTEROL 0.5-2.5 (3) MG/3ML IN SOLN: 3.0000 mL | Freq: Four times a day (QID) | RESPIRATORY_TRACT | Status: DC | PRN

## 2013-03-03 MED ORDER — SOLIFENACIN SUCCINATE 5 MG PO TABS: 10.0000 mg | ORAL_TABLET | Freq: Every day | ORAL | Status: DC

## 2013-03-03 NOTE — Telephone Encounter (Signed)
Medication refilled per protocol. 

## 2013-03-08 ENCOUNTER — Encounter: Payer: Self-pay | Admitting: Physical Medicine and Rehabilitation

## 2013-03-08 ENCOUNTER — Encounter
Payer: Medicaid Other | Attending: Physical Medicine and Rehabilitation | Admitting: Physical Medicine and Rehabilitation

## 2013-03-08 VITALS — BP 149/75 | HR 87 | Resp 16 | Ht 65.0 in | Wt 220.0 lb

## 2013-03-08 DIAGNOSIS — M546 Pain in thoracic spine: Secondary | ICD-10-CM

## 2013-03-08 DIAGNOSIS — M25519 Pain in unspecified shoulder: Secondary | ICD-10-CM

## 2013-03-08 DIAGNOSIS — Z79899 Other long term (current) drug therapy: Secondary | ICD-10-CM

## 2013-03-08 DIAGNOSIS — G8929 Other chronic pain: Secondary | ICD-10-CM | POA: Insufficient documentation

## 2013-03-08 DIAGNOSIS — Z5181 Encounter for therapeutic drug level monitoring: Secondary | ICD-10-CM

## 2013-03-08 DIAGNOSIS — M47814 Spondylosis without myelopathy or radiculopathy, thoracic region: Secondary | ICD-10-CM | POA: Insufficient documentation

## 2013-03-08 DIAGNOSIS — M76899 Other specified enthesopathies of unspecified lower limb, excluding foot: Secondary | ICD-10-CM | POA: Insufficient documentation

## 2013-03-08 DIAGNOSIS — M79609 Pain in unspecified limb: Secondary | ICD-10-CM

## 2013-03-08 DIAGNOSIS — R269 Unspecified abnormalities of gait and mobility: Secondary | ICD-10-CM

## 2013-03-08 DIAGNOSIS — M545 Low back pain: Secondary | ICD-10-CM

## 2013-03-08 DIAGNOSIS — M47817 Spondylosis without myelopathy or radiculopathy, lumbosacral region: Secondary | ICD-10-CM | POA: Insufficient documentation

## 2013-03-08 DIAGNOSIS — IMO0001 Reserved for inherently not codable concepts without codable children: Secondary | ICD-10-CM | POA: Insufficient documentation

## 2013-03-08 DIAGNOSIS — M25512 Pain in left shoulder: Secondary | ICD-10-CM

## 2013-03-08 MED ORDER — HYDROCODONE-ACETAMINOPHEN 10-325 MG PO TABS: 1.0000 | ORAL_TABLET | Freq: Three times a day (TID) | ORAL | Status: DC
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
## 2013-03-08 NOTE — Patient Instructions (Signed)
Ice your shoulder today every hr, apply Voltaren gel 4 times a day from tomorrow on.

## 2013-03-16 ENCOUNTER — Telehealth: Payer: Self-pay | Admitting: Gastroenterology

## 2013-03-16 NOTE — Telephone Encounter (Signed)
Please remind patient has stool studies and lab work done.

## 2013-03-16 NOTE — Telephone Encounter (Signed)
Tried to call pt- LMOM 

## 2013-03-17 NOTE — Telephone Encounter (Signed)
Tried to call pt- LMOM 

## 2013-03-18 NOTE — Telephone Encounter (Signed)
Mailed reminder letter

## 2013-03-21 NOTE — Progress Notes (Signed)
Patient ID: Lisa Ewing, female   DOB: 1956-07-02, 58 y.o.   MRN: [id number] Pt called to inform us that she was going to do the stool studies and blood work. The only reason she has not done it is because she fell and was hurt.

## 2013-03-24 LAB — COMPREHENSIVE METABOLIC PANEL
AST: 14 U/L (ref 0–37)
Albumin: 4.2 g/dL (ref 3.5–5.2)
BUN: 16 mg/dL (ref 6–23)
Calcium: 9.7 mg/dL (ref 8.4–10.5)
Chloride: 98 mEq/L (ref 96–112)
Creat: 0.78 mg/dL (ref 0.50–1.10)
Glucose, Bld: 146 mg/dL — ABNORMAL HIGH (ref 70–99)

## 2013-03-24 LAB — CBC WITH DIFFERENTIAL/PLATELET
Basophils Absolute: 0 10*3/uL (ref 0.0–0.1)
Eosinophils Relative: 1 % (ref 0–5)
HCT: 44.1 % (ref 36.0–46.0)
Hemoglobin: 15 g/dL (ref 12.0–15.0)
Lymphocytes Relative: 30 % (ref 12–46)
MCHC: 34 g/dL (ref 30.0–36.0)
MCV: 83.2 fL (ref 78.0–100.0)
Monocytes Absolute: 0.9 10*3/uL (ref 0.1–1.0)
Monocytes Relative: 7 % (ref 3–12)
RDW: 15.9 % — ABNORMAL HIGH (ref 11.5–15.5)

## 2013-03-25 LAB — GIARDIA/CRYPTOSPORIDIUM (EIA): Cryptosporidium Screen (EIA): NEGATIVE

## 2013-03-25 LAB — CLOSTRIDIUM DIFFICILE BY PCR: Toxigenic C. Difficile by PCR: NOT DETECTED

## 2013-03-28 LAB — STOOL CULTURE

## 2013-03-28 NOTE — Progress Notes (Signed)
Quick Note:  No celiac. TSH normal.  WBC slightly elevated. Elevated multiple times in 12/2010 but she was in hospital at that time. Stool studies negative.  Please find out if she has had any fever, dysuria, cough, sinusitis, on steroids etc. If no signs of infection, we will have her repeat CBC in one month. If still elevated at that time, would advise for nonurgent OV with hematologist.  How is she doing on Bentyl? ______

## 2013-03-29 ENCOUNTER — Other Ambulatory Visit: Payer: Self-pay | Admitting: Gastroenterology

## 2013-03-29 ENCOUNTER — Other Ambulatory Visit: Payer: Self-pay

## 2013-03-29 ENCOUNTER — Other Ambulatory Visit (HOSPITAL_COMMUNITY): Payer: Self-pay | Admitting: Family Medicine

## 2013-03-29 DIAGNOSIS — R197 Diarrhea, unspecified: Secondary | ICD-10-CM

## 2013-03-29 DIAGNOSIS — G8929 Other chronic pain: Secondary | ICD-10-CM

## 2013-03-29 DIAGNOSIS — R1011 Right upper quadrant pain: Secondary | ICD-10-CM

## 2013-03-29 NOTE — Telephone Encounter (Signed)
Medication refilled per protocol. 

## 2013-03-31 ENCOUNTER — Other Ambulatory Visit: Payer: Self-pay | Admitting: Family Medicine

## 2013-03-31 ENCOUNTER — Telehealth: Payer: Self-pay | Admitting: Family Medicine

## 2013-03-31 MED ORDER — ATORVASTATIN CALCIUM 40 MG PO TABS: 40.0000 mg | ORAL_TABLET | Freq: Every day | ORAL | Status: DC

## 2013-03-31 NOTE — Telephone Encounter (Signed)
What would you like to change her to?

## 2013-03-31 NOTE — Progress Notes (Signed)
Quick Note:  Let's check abd u/s for right sided abd pain, leukocytosis.  OV with meE30 in 2-3 weeks to follow up diarrhea. ______

## 2013-03-31 NOTE — Telephone Encounter (Signed)
I will e-scribe lipitor 40 mg poqday.

## 2013-04-01 ENCOUNTER — Other Ambulatory Visit: Payer: Self-pay | Admitting: Gastroenterology

## 2013-04-01 DIAGNOSIS — R109 Unspecified abdominal pain: Secondary | ICD-10-CM

## 2013-04-01 NOTE — Progress Notes (Signed)
Quick Note:  Pt is aware,  Benedetto Goad, please schedule U/S Darl Pikes, please schedule ov ______

## 2013-04-01 NOTE — Progress Notes (Signed)
Patient is scheduled for Abd U/S on Wednesday July 30 at 9:00 am and she is aware

## 2013-04-03 NOTE — Telephone Encounter (Signed)
Completed.

## 2013-04-04 ENCOUNTER — Encounter: Payer: Self-pay | Admitting: Gastroenterology

## 2013-04-06 ENCOUNTER — Ambulatory Visit (HOSPITAL_COMMUNITY)
Admission: RE | Admit: 2013-04-06 | Discharge: 2013-04-06 | Disposition: A | Discharge status: Home / Self Care | Payer: Medicaid Other | Source: Ambulatory Visit | Attending: Gastroenterology | Admitting: Gastroenterology

## 2013-04-06 DIAGNOSIS — K7689 Other specified diseases of liver: Secondary | ICD-10-CM | POA: Insufficient documentation

## 2013-04-06 DIAGNOSIS — R110 Nausea: Secondary | ICD-10-CM | POA: Insufficient documentation

## 2013-04-06 DIAGNOSIS — R109 Unspecified abdominal pain: Secondary | ICD-10-CM

## 2013-04-08 NOTE — Progress Notes (Signed)
Quick Note:  Fatty liver on [place], otherwise normal.  Routing to [facility] for further recommendations. ______

## 2013-04-13 NOTE — Progress Notes (Signed)
Quick Note:  Fatty liver but normal LFTs. OV later this month as planned. ______

## 2013-04-21 ENCOUNTER — Other Ambulatory Visit: Payer: Self-pay | Admitting: Family Medicine

## 2013-04-26 LAB — CBC WITH DIFFERENTIAL/PLATELET
Basophils Relative: 0 % (ref 0–1)
Hemoglobin: 14.5 g/dL (ref 12.0–15.0)
Lymphocytes Relative: 34 % (ref 12–46)
MCHC: 33.9 g/dL (ref 30.0–36.0)
Monocytes Relative: 8 % (ref 3–12)
Neutro Abs: 6.8 10*3/uL (ref 1.7–7.7)
Neutrophils Relative %: 56 % (ref 43–77)
RBC: 5.09 MIL/uL (ref 3.87–5.11)
WBC: 12.3 10*3/uL — ABNORMAL HIGH (ref 4.0–10.5)

## 2013-04-27 ENCOUNTER — Ambulatory Visit: Payer: Medicaid Other | Admitting: Gastroenterology

## 2013-05-03 NOTE — Progress Notes (Signed)
Quick Note:  WBC remains slightly elevated. Recommend nonurgent OV with hematologist for persistent leukocytosis. ______

## 2013-05-04 ENCOUNTER — Other Ambulatory Visit: Payer: Self-pay | Admitting: Gastroenterology

## 2013-05-04 DIAGNOSIS — D72829 Elevated white blood cell count, unspecified: Secondary | ICD-10-CM

## 2013-05-04 NOTE — Progress Notes (Signed)
Quick Note:  Pt is aware. Ok to refer to hematologist. Benedetto Goad, please refer pt. ______

## 2013-05-10 ENCOUNTER — Encounter
Payer: Medicaid Other | Attending: Physical Medicine and Rehabilitation | Admitting: Physical Medicine and Rehabilitation

## 2013-05-10 ENCOUNTER — Encounter: Payer: Self-pay | Admitting: Physical Medicine and Rehabilitation

## 2013-05-10 VITALS — BP 135/78 | HR 106 | Resp 16 | Ht 65.0 in | Wt 220.0 lb

## 2013-05-10 DIAGNOSIS — M545 Low back pain, unspecified: Secondary | ICD-10-CM | POA: Insufficient documentation

## 2013-05-10 DIAGNOSIS — Z8542 Personal history of malignant neoplasm of other parts of uterus: Secondary | ICD-10-CM | POA: Insufficient documentation

## 2013-05-10 DIAGNOSIS — IMO0001 Reserved for inherently not codable concepts without codable children: Secondary | ICD-10-CM

## 2013-05-10 DIAGNOSIS — M961 Postlaminectomy syndrome, not elsewhere classified: Secondary | ICD-10-CM

## 2013-05-10 DIAGNOSIS — M79609 Pain in unspecified limb: Secondary | ICD-10-CM | POA: Insufficient documentation

## 2013-05-10 DIAGNOSIS — E119 Type 2 diabetes mellitus without complications: Secondary | ICD-10-CM | POA: Insufficient documentation

## 2013-05-10 DIAGNOSIS — E785 Hyperlipidemia, unspecified: Secondary | ICD-10-CM | POA: Insufficient documentation

## 2013-05-10 DIAGNOSIS — R209 Unspecified disturbances of skin sensation: Secondary | ICD-10-CM | POA: Insufficient documentation

## 2013-05-10 DIAGNOSIS — M25519 Pain in unspecified shoulder: Secondary | ICD-10-CM | POA: Insufficient documentation

## 2013-05-10 DIAGNOSIS — G4730 Sleep apnea, unspecified: Secondary | ICD-10-CM | POA: Insufficient documentation

## 2013-05-10 DIAGNOSIS — M47817 Spondylosis without myelopathy or radiculopathy, lumbosacral region: Secondary | ICD-10-CM | POA: Insufficient documentation

## 2013-05-10 DIAGNOSIS — G5790 Unspecified mononeuropathy of unspecified lower limb: Secondary | ICD-10-CM | POA: Insufficient documentation

## 2013-05-10 DIAGNOSIS — R29898 Other symptoms and signs involving the musculoskeletal system: Secondary | ICD-10-CM | POA: Insufficient documentation

## 2013-05-10 DIAGNOSIS — R269 Unspecified abnormalities of gait and mobility: Secondary | ICD-10-CM | POA: Insufficient documentation

## 2013-05-10 DIAGNOSIS — M76899 Other specified enthesopathies of unspecified lower limb, excluding foot: Secondary | ICD-10-CM | POA: Insufficient documentation

## 2013-05-10 DIAGNOSIS — Z8541 Personal history of malignant neoplasm of cervix uteri: Secondary | ICD-10-CM | POA: Insufficient documentation

## 2013-05-10 DIAGNOSIS — F17200 Nicotine dependence, unspecified, uncomplicated: Secondary | ICD-10-CM | POA: Insufficient documentation

## 2013-05-10 DIAGNOSIS — M47814 Spondylosis without myelopathy or radiculopathy, thoracic region: Secondary | ICD-10-CM | POA: Insufficient documentation

## 2013-05-10 DIAGNOSIS — K219 Gastro-esophageal reflux disease without esophagitis: Secondary | ICD-10-CM | POA: Insufficient documentation

## 2013-05-10 DIAGNOSIS — M810 Age-related osteoporosis without current pathological fracture: Secondary | ICD-10-CM | POA: Insufficient documentation

## 2013-05-10 DIAGNOSIS — J449 Chronic obstructive pulmonary disease, unspecified: Secondary | ICD-10-CM | POA: Insufficient documentation

## 2013-05-10 DIAGNOSIS — G894 Chronic pain syndrome: Secondary | ICD-10-CM

## 2013-05-10 DIAGNOSIS — J4489 Other specified chronic obstructive pulmonary disease: Secondary | ICD-10-CM | POA: Insufficient documentation

## 2013-05-10 DIAGNOSIS — G8929 Other chronic pain: Secondary | ICD-10-CM | POA: Insufficient documentation

## 2013-05-10 MED ORDER — HYDROCODONE-ACETAMINOPHEN 10-325 MG PO TABS: 1.0000 | ORAL_TABLET | Freq: Three times a day (TID) | ORAL | Status: DC
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
## 2013-05-10 NOTE — Patient Instructions (Signed)
Try to stay as active as tolerated 

## 2013-05-11 ENCOUNTER — Encounter: Payer: Self-pay | Admitting: Gastroenterology

## 2013-05-11 ENCOUNTER — Ambulatory Visit (INDEPENDENT_AMBULATORY_CARE_PROVIDER_SITE_OTHER): Payer: Medicaid Other | Admitting: Gastroenterology

## 2013-05-11 VITALS — BP 121/71 | HR 89 | Temp 98.5°F | Ht 68.0 in | Wt 247.2 lb

## 2013-05-11 DIAGNOSIS — G8929 Other chronic pain: Secondary | ICD-10-CM

## 2013-05-11 DIAGNOSIS — K589 Irritable bowel syndrome without diarrhea: Secondary | ICD-10-CM | POA: Insufficient documentation

## 2013-05-11 DIAGNOSIS — R1011 Right upper quadrant pain: Secondary | ICD-10-CM

## 2013-05-11 NOTE — Assessment & Plan Note (Addendum)
?  multifactorial. Suspect element of musculoskeletal pain. We will rule out gb with HIDA. If negative, and she continues to have problems, we may need to get CT to complete GI work-up.  Chronic leukocytosis. Going for hematology consultation tomorrow. She is to mention her urinary issues as they may consider getting urinalysis.

## 2013-05-11 NOTE — Progress Notes (Signed)
Primary Care Physician: Leo Grosser, MD  Primary Gastroenterologist:  Roetta Sessions, MD   Chief Complaint  Patient presents with  . Follow-up    HPI: Lisa Ewing is a 57 y.o. female here for followup. Last seen in June 2014. History of GERD, likely an element of gastroparesis but has been intolerant to Reglan. Previously on domperidone. Gastric emptying borderline April 2011.   At the last office visit she complains of several month history of explosive loose stools occurring every other day. Prior to that she had constipation predominant IBS. No melena rectal bleeding. Complains of chronic intermittent right-sided pain along the right lower rib cage margin/flank seems to be more positional. Celiac labs negative. TSH was normal. White blood cell count slightly elevated and had been so several times so she was referred to the hematologist. Stool studies were negative. Abdominal ultrasound 04/13/2013 showed moderate hepatic steatosis.  Doing better with loose stools. One episode per week now. Otherwise having regular BMs every other day. Bentyl once per week. No abdominal cramping. No melena, brbpr. Still with daily RUQ pain/epigastric pain/right flank pain. Worse with positions, but worse with meals. Worse during BM but some better afterwards.   Tells me today that she is keeping her UTIs in check with cranberry juice. Denies dysuria.    Current Outpatient Prescriptions  Medication Sig Dispense Refill  . ACCU-CHEK AVIVA PLUS test strip USE TO TEST BLOOD SUGAR ONCE DAILY  50 each  5  . ACCU-CHEK FASTCLIX LANCETS MISC USE TO TEST BLOOD SUGAR ONCE DAILY  102 each  5  . acyclovir (ZOVIRAX) 400 MG tablet TAKE ONE TABLET BY MOUTH AT BEDTIME  30 tablet  5  . ADVAIR DISKUS 250-50 MCG/DOSE AEPB TAKE 1 INHALATION BY MOUTH TWICE DAILY.  RINSE MOUTH AFTER USE.  60 each  5  . atorvastatin (LIPITOR) 40 MG tablet Take 1 tablet (40 mg total) by mouth daily.  90 tablet  3  . azelastine (ASTELIN) 137  MCG/SPRAY nasal spray Place 2 sprays into the nose 2 (two) times daily. Use in each nostril as directed  30 mL  12  . calcitRIOL (ROCALTROL) 0.25 MCG capsule TAKE ONE CAPSULE BY MOUTH ONCE DAILY  30 capsule  5  . cetirizine (ZYRTEC) 10 MG tablet TAKE ONE TABLET BY MOUTH DAILY FOR ALLERGIES  30 tablet  5  . cyclobenzaprine (FLEXERIL) 10 MG tablet Take 10 mg by mouth 3 (three) times daily as needed for muscle spasms.      . diclofenac sodium (VOLTAREN) 1 % GEL Apply 2 g topically 4 (four) times daily.  1 Tube  2  . dicyclomine (BENTYL) 10 MG capsule Take 1 capsule before meals and at bedtime as needed for loose stools. Hold for constipation.  120 capsule  1  . EPINEPHrine (EPIPEN JR) 0.15 MG/0.3ML injection Inject 0.15 mg into the muscle as needed. Allergic Reaction      . EPIPEN 2-PAK 0.3 MG/0.3ML SOAJ injection INJECT 1 PEN INTRAMUSCULARLY AS NEEDED FOR ALLERGIC REACTION  2 Device  0  . escitalopram (LEXAPRO) 20 MG tablet TAKE ONE TABLET BY MOUTH DAILY  30 tablet  5  . furosemide (LASIX) 20 MG tablet TAKE ONE TABLET BY MOUTH EACH MORNING  30 tablet  5  . gabapentin (NEURONTIN) 300 MG capsule Take one 300 mg tablet PO three times per day and two tablets at HS.  150 capsule  3  . HYDROcodone-acetaminophen (NORCO) 10-325 MG per tablet Take 1 tablet by mouth 3 (three) times  daily.  90 tablet  1  . ibandronate (BONIVA) 150 MG tablet TAKE 1 TABLET EVERY MONTH  1 tablet  5  . ipratropium-albuterol (DUONEB) 0.5-2.5 (3) MG/3ML SOLN INHALE THE CONTENTS OF ONE VIAL VIA NEBULIZER BY MOUTH FOUR TIMES A DAY AS NEEDED FOR WHEEZING OR SHORTNESS OF BREATH  360 mL  0  . levothyroxine (SYNTHROID, LEVOTHROID) 125 MCG tablet TAKE ONE TABLET BY MOUTH DAILY 30 MINUTES BEFORE BREAKFAST  30 tablet  5  . metFORMIN (GLUCOPHAGE) 1000 MG tablet TAKE ONE TABLET BY MOUTH TWICE DAILY  60 tablet  5  . montelukast (SINGULAIR) 10 MG tablet TAKE ONE TABLET BY MOUTH AT BEDTIME  30 tablet  5  . NEXIUM 40 MG capsule TAKE ONE CAPSULE BY  MOUTH TWICE DAILY  60 capsule  5  . niacin 500 MG tablet Take 500 mg by mouth daily. 3 tablets at bedtime      . NIASPAN 500 MG CR tablet TAKE THREE TABLETS BY MOUTH AT BEDTIME  90 tablet  5  . PROVENTIL HFA 108 (90 BASE) MCG/ACT inhaler USE 2 PUFFS EVERY 4 TO 6 HOURS AS NEEDED FOR SHORTNESS OF BREATH  1 Inhaler  5  . raloxifene (EVISTA) 60 MG tablet TAKE ONE TABLET BY MOUTH DAILY  30 tablet  5  . traZODone (DESYREL) 100 MG tablet Take 1 tablet (100 mg total) by mouth at bedtime.  30 tablet  2  . triamcinolone cream (KENALOG) 0.1 % Apply topically 2 (two) times daily.  30 g  prn  . TRILIPIX 135 MG capsule TAKE ONE CAPSULE BY MOUTH ONCE DAILY  30 capsule  5  . VESICARE 5 MG tablet TAKE 1 TABLET BY MOUTH EVERY DAY  30 tablet  5  . [DISCONTINUED] solifenacin (VESICARE) 5 MG tablet Take 10 mg by mouth daily.       No current facility-administered medications for this visit.    Allergies as of 05/11/2013 - Review Complete 05/11/2013  Allergen Reaction Noted  . Bee venom Anaphylaxis 10/10/2011  . Latex Shortness Of Breath 11/27/2009  . Oxycodone-acetaminophen Hives and Shortness Of Breath   . Penicillins Anaphylaxis   . Shellfish allergy Anaphylaxis 10/10/2011  . Codeine Nausea Only 10/10/2011  . Metoclopramide hcl    . Other Swelling 01/05/2012  . Pregabalin Swelling   . Sulfonamide derivatives    . Adhesive [tape] Rash 10/10/2011    ROS:  General: Negative for anorexia, weight loss, fever, chills, fatigue, weakness. ENT: Negative for hoarseness, difficulty swallowing , nasal congestion. CV: Negative for chest pain, angina, palpitations, dyspnea on exertion, peripheral edema.  Respiratory: Negative for dyspnea at rest, dyspnea on exertion, cough, sputum, wheezing.  GI: See history of present illness. GU:  Negative for dysuria, hematuria, urinary incontinence, urinary frequency, nocturnal urination.  Endo: Negative for unusual weight change.    Physical Examination:   BP 121/71   Pulse 89  Temp(Src) 98.5 F (36.9 C) (Oral)  Ht 5\' 8"  (1.727 m)  Wt 247 lb 3.2 oz (112.129 kg)  BMI 37.6 kg/m2  General: Well-nourished, well-developed in no acute distress.  Eyes: No icterus. Mouth: Oropharyngeal mucosa moist and pink , no lesions erythema or exudate. Lungs: Clear to auscultation bilaterally.  Heart: Regular rate and rhythm, no murmurs rubs or gallops.  Abdomen: Bowel sounds are normal, right upper quadrant/epigastric tenderness, nondistended, no hepatosplenomegaly or masses, no abdominal bruits or hernia , no rebound or guarding.   Extremities: No lower extremity edema. No clubbing or deformities. Neuro: Alert and  oriented x 4   Skin: Warm and dry, no jaundice.   Psych: Alert and cooperative, normal mood and affect.

## 2013-05-11 NOTE — Patient Instructions (Addendum)
Please have your gallbladder test as scheduled. I will follow up on the hematologist work up as available. Continue bentyl as needed for loose stools.

## 2013-05-11 NOTE — Assessment & Plan Note (Signed)
Continue Bentyl prn.  

## 2013-05-12 ENCOUNTER — Other Ambulatory Visit: Payer: Self-pay | Admitting: Gastroenterology

## 2013-05-12 ENCOUNTER — Ambulatory Visit (HOSPITAL_COMMUNITY): Payer: Medicaid Other

## 2013-05-12 DIAGNOSIS — R197 Diarrhea, unspecified: Secondary | ICD-10-CM

## 2013-05-12 DIAGNOSIS — G8929 Other chronic pain: Secondary | ICD-10-CM

## 2013-05-12 NOTE — Progress Notes (Signed)
CC'd to PCP 

## 2013-05-13 DIAGNOSIS — M1710 Unilateral primary osteoarthritis, unspecified knee: Secondary | ICD-10-CM

## 2013-05-13 DIAGNOSIS — M79609 Pain in unspecified limb: Secondary | ICD-10-CM

## 2013-05-13 DIAGNOSIS — M169 Osteoarthritis of hip, unspecified: Secondary | ICD-10-CM

## 2013-05-13 DIAGNOSIS — R32 Unspecified urinary incontinence: Secondary | ICD-10-CM

## 2013-05-16 ENCOUNTER — Encounter (HOSPITAL_COMMUNITY): Admission: RE | Admit: 2013-05-16 | Payer: Medicaid Other | Source: Ambulatory Visit

## 2013-05-19 ENCOUNTER — Encounter (HOSPITAL_COMMUNITY): Payer: Self-pay

## 2013-05-19 ENCOUNTER — Encounter (HOSPITAL_COMMUNITY): Payer: Medicaid Other | Attending: Hematology and Oncology

## 2013-05-19 VITALS — BP 144/82 | HR 98 | Temp 97.3°F | Resp 16 | Ht 65.0 in | Wt 245.4 lb

## 2013-05-19 DIAGNOSIS — F17200 Nicotine dependence, unspecified, uncomplicated: Secondary | ICD-10-CM

## 2013-05-19 DIAGNOSIS — D72829 Elevated white blood cell count, unspecified: Secondary | ICD-10-CM | POA: Insufficient documentation

## 2013-05-19 DIAGNOSIS — N390 Urinary tract infection, site not specified: Secondary | ICD-10-CM

## 2013-05-19 LAB — CBC WITH DIFFERENTIAL/PLATELET
Basophils Absolute: 0 10*3/uL (ref 0.0–0.1)
Basophils Relative: 0 % (ref 0–1)
Hemoglobin: 14.3 g/dL (ref 12.0–15.0)
MCHC: 33.1 g/dL (ref 30.0–36.0)
Monocytes Relative: 8 % (ref 3–12)
Neutro Abs: 6.7 10*3/uL (ref 1.7–7.7)
Neutrophils Relative %: 58 % (ref 43–77)
Platelets: 271 10*3/uL (ref 150–400)
RDW: 15.5 % (ref 11.5–15.5)

## 2013-05-19 LAB — COMPREHENSIVE METABOLIC PANEL
ALT: 19 U/L (ref 0–35)
AST: 15 U/L (ref 0–37)
Albumin: 3.5 g/dL (ref 3.5–5.2)
Alkaline Phosphatase: 57 U/L (ref 39–117)
Potassium: 4.2 mEq/L (ref 3.5–5.1)
Sodium: 138 mEq/L (ref 135–145)
Total Protein: 6.9 g/dL (ref 6.0–8.3)

## 2013-05-19 LAB — LACTATE DEHYDROGENASE: LDH: 82 U/L — ABNORMAL LOW (ref 94–250)
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
## 2013-05-19 NOTE — Progress Notes (Signed)
Lisa Ewing presented for labwork. Labs per MD order drawn via Peripheral Line 23 gauge needle inserted in left AC  Good blood return present. Procedure without incident.  Needle removed intact. Patient tolerated procedure well.

## 2013-05-19 NOTE — Patient Instructions (Addendum)
[facility] Discharge Instructions  RECOMMENDATIONS MADE BY THE CONSULTANT AND ANY TEST RESULTS WILL BE SENT TO YOUR REFERRING PHYSICIAN.  EXAM FINDINGS BY THE PHYSICIAN TODAY AND SIGNS OR SYMPTOMS TO REPORT TO CLINIC OR PRIMARY PHYSICIAN: Exam and discussion by Dr. Sharia Reeve.  Elevated white blood count can be related to smoking, being over weight, reactive process, physical stress such as infection, etc.  Would recommend that you stop smoking if at all possible and to lose weight.  We will check some labs today and MD will look at your blood smear.  If there is anything that is of concern we will let you know.  MEDICATIONS PRESCRIBED:  None  INSTRUCTIONS GIVEN AND DISCUSSED: See above   SPECIAL INSTRUCTIONS/FOLLOW-UP: Follow-up in 2 weeks.  Thank you for choosing [facility] to provide your oncology and hematology care.  To afford each patient quality time with our providers, please arrive at least 15 minutes before your scheduled appointment time.  With your help, our goal is to use those 15 minutes to complete the necessary work-up to ensure our physicians have the information they need to help with your evaluation and healthcare recommendations.    Effective January 1st, 2014, we ask that you re-schedule your appointment with our physicians should you arrive 10 or more minutes late for your appointment.  We strive to give you quality time with our providers, and arriving late affects you and other patients whose appointments are after yours.    Again, thank you for choosing [facility].  Our hope is that these requests will decrease the amount of time that you wait before being seen by our physicians.       _____________________________________________________________  Should you have questions after your visit to [facility], please contact our office at [phone] between the hours of 8:30 a.m. and 5:00 p.m.  Voicemails left  after 4:30 p.m. will not be returned until the following business day.  For prescription refill requests, have your pharmacy contact our office with your prescription refill request.

## 2013-05-20 NOTE — Addendum Note (Signed)
Addended by: Sherral Hammers on: 05/20/2013 04:29 PM   Modules accepted: Orders

## 2013-05-23 ENCOUNTER — Encounter (HOSPITAL_COMMUNITY)
Admission: RE | Admit: 2013-05-23 | Discharge: 2013-05-23 | Disposition: A | Discharge status: Home / Self Care | Payer: Medicaid Other | Source: Ambulatory Visit | Attending: Gastroenterology | Admitting: Gastroenterology

## 2013-05-23 ENCOUNTER — Encounter (HOSPITAL_COMMUNITY): Payer: Self-pay

## 2013-05-23 ENCOUNTER — Encounter (HOSPITAL_BASED_OUTPATIENT_CLINIC_OR_DEPARTMENT_OTHER): Payer: Medicaid Other

## 2013-05-23 ENCOUNTER — Inpatient Hospital Stay (HOSPITAL_COMMUNITY): Admit: 2013-05-23 | Payer: Self-pay

## 2013-05-23 DIAGNOSIS — G8929 Other chronic pain: Secondary | ICD-10-CM

## 2013-05-23 DIAGNOSIS — R1011 Right upper quadrant pain: Secondary | ICD-10-CM | POA: Insufficient documentation

## 2013-05-23 DIAGNOSIS — D72829 Elevated white blood cell count, unspecified: Secondary | ICD-10-CM

## 2013-05-23 DIAGNOSIS — R197 Diarrhea, unspecified: Secondary | ICD-10-CM

## 2013-05-23 MED ORDER — TECHNETIUM TC 99M MEBROFENIN IV KIT
5.0000 | PACK | Freq: Once | INTRAVENOUS | Status: AC | PRN
  Administered 2013-05-23: 5 via INTRAVENOUS

## 2013-05-23 NOTE — Progress Notes (Signed)
Labs drawn today for Flow Cytometry

## 2013-05-27 ENCOUNTER — Other Ambulatory Visit: Payer: Self-pay | Admitting: Physical Medicine and Rehabilitation

## 2013-05-27 ENCOUNTER — Other Ambulatory Visit: Payer: Self-pay | Admitting: Family Medicine

## 2013-05-28 DIAGNOSIS — D72829 Elevated white blood cell count, unspecified: Secondary | ICD-10-CM | POA: Insufficient documentation

## 2013-06-01 ENCOUNTER — Other Ambulatory Visit: Payer: Self-pay | Admitting: Family Medicine

## 2013-06-01 DIAGNOSIS — Z79899 Other long term (current) drug therapy: Secondary | ICD-10-CM

## 2013-06-01 DIAGNOSIS — E039 Hypothyroidism, unspecified: Secondary | ICD-10-CM

## 2013-06-01 MED ORDER — LEVOTHYROXINE SODIUM 125 MCG PO TABS: 125.0000 ug | ORAL_TABLET | Freq: Every day | ORAL | Status: DC

## 2013-06-01 NOTE — Telephone Encounter (Signed)
Pharmacy needs OK to change manufacturer of Thyroid medication.  OK sent to pharmacy.  Left mess with patient to have labs done in 6 weeks.

## 2013-06-02 ENCOUNTER — Encounter (HOSPITAL_BASED_OUTPATIENT_CLINIC_OR_DEPARTMENT_OTHER): Payer: Medicaid Other

## 2013-06-02 ENCOUNTER — Encounter (HOSPITAL_COMMUNITY): Payer: Self-pay

## 2013-06-02 VITALS — BP 129/85 | HR 104 | Temp 97.4°F | Resp 20 | Wt 241.9 lb

## 2013-06-02 DIAGNOSIS — D72829 Elevated white blood cell count, unspecified: Secondary | ICD-10-CM

## 2013-06-02 DIAGNOSIS — N390 Urinary tract infection, site not specified: Secondary | ICD-10-CM

## 2013-06-02 HISTORY — DX: Urinary tract infection, site not specified: N39.0

## 2013-06-02 MED ORDER — CIPROFLOXACIN HCL 250 MG PO TABS: 250.0000 mg | ORAL_TABLET | Freq: Every morning | ORAL | Status: DC
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
## 2013-06-02 NOTE — Progress Notes (Signed)
Rx escribed to [facility] cancelled and called into [place].

## 2013-06-02 NOTE — Patient Instructions (Addendum)
[facility] Discharge Instructions  RECOMMENDATIONS MADE BY THE CONSULTANT AND ANY TEST RESULTS WILL BE SENT TO YOUR REFERRING PHYSICIAN.  Your elevated white blood cell count is a result of your smoking. We strongly encourage you to STOP SMOKING. A prescription was given to you for Cipro. Take as directed x 5 days. There is 1 refill if you should need it. You do not need to return to this clinic unless to be seen for a Lisa Ewing/different problem. Please consider [place] if you are ever in need of our services again.  Smoking Cessation Quitting smoking is important to your health and has many advantages. However, it is not always easy to quit since nicotine is a very addictive drug. Often times, people try 3 times or more before being able to quit. This document explains the best ways for you to prepare to quit smoking. Quitting takes hard work and a lot of effort, but you can do it. ADVANTAGES OF QUITTING SMOKING  You will live longer, feel better, and live better.  Your body will feel the impact of quitting smoking almost immediately.  Within 20 minutes, blood pressure decreases. Your pulse returns to its normal level.  After 8 hours, carbon monoxide levels in the blood return to normal. Your oxygen level increases.  After 24 hours, the chance of having a heart attack starts to decrease. Your breath, hair, and body stop smelling like smoke.  After 48 hours, damaged nerve endings begin to recover. Your sense of taste and smell improve.  After 72 hours, the body is virtually free of nicotine. Your bronchial tubes relax and breathing becomes easier.  After 2 to 12 weeks, lungs can hold more air. Exercise becomes easier and circulation improves.  The risk of having a heart attack, stroke, cancer, or lung disease is greatly reduced.  After 1 year, the risk of coronary heart disease is cut in half.  After 5 years, the risk of stroke falls to the same as a  nonsmoker.  After 10 years, the risk of lung cancer is cut in half and the risk of other cancers decreases significantly.  After 15 years, the risk of coronary heart disease drops, usually to the level of a nonsmoker.  If you are pregnant, quitting smoking will improve your chances of having a healthy baby.  The people you live with, especially any children, will be healthier.  You will have extra money to spend on things other than cigarettes. QUESTIONS TO THINK ABOUT BEFORE ATTEMPTING TO QUIT You may want to talk about your answers with your caregiver.  Why do you want to quit?  If you tried to quit in the past, what helped and what did not?  What will be the most difficult situations for you after you quit? How will you plan to handle them?  Who can help you through the tough times? Your family? Friends? A caregiver?  What pleasures do you get from smoking? What ways can you still get pleasure if you quit? Here are some questions to ask your caregiver:  How can you help me to be successful at quitting?  What medicine do you think would be best for me and how should I take it?  What should I do if I need more help?  What is smoking withdrawal like? How can I get information on withdrawal? GET READY  Set a quit date.  Change your environment by getting rid of all cigarettes, ashtrays, matches, and lighters in your  home, car, or work. Do not let people smoke in your home.  Review your past attempts to quit. Think about what worked and what did not. GET SUPPORT AND ENCOURAGEMENT You have a better chance of being successful if you have help. You can get support in many ways.  Tell your family, friends, and co-workers that you are going to quit and need their support. Ask them not to smoke around you.  Get individual, group, or telephone counseling and support. Programs are available at [employer] and health centers. Call your local health department for information  about programs in your area.  Spiritual beliefs and practices may help some smokers quit.  Download a "quit meter" on your computer to keep track of quit statistics, such as how long you have gone without smoking, cigarettes not smoked, and money saved.  Get a self-help book about quitting smoking and staying off of tobacco. LEARN Lisa Ewing SKILLS AND BEHAVIORS  Distract yourself from urges to smoke. Talk to someone, go for a walk, or occupy your time with a task.  Change your normal routine. Take a different route to work. Drink tea instead of coffee. Eat breakfast in a different place.  Reduce your stress. Take a hot bath, exercise, or read a book.  Plan something enjoyable to do every day. Reward yourself for not smoking.  Explore interactive web-based programs that specialize in helping you quit. GET MEDICINE AND USE IT CORRECTLY Medicines can help you stop smoking and decrease the urge to smoke. Combining medicine with the above behavioral methods and support can greatly increase your chances of successfully quitting smoking.  Nicotine replacement therapy helps deliver nicotine to your body without the negative effects and risks of smoking. Nicotine replacement therapy includes nicotine gum, lozenges, inhalers, nasal sprays, and skin patches. Some may be available over-the-counter and others require a prescription.  Antidepressant medicine helps people abstain from smoking, but how this works is unknown. This medicine is available by prescription.  Nicotinic receptor partial agonist medicine simulates the effect of nicotine in your brain. This medicine is available by prescription. Ask your caregiver for advice about which medicines to use and how to use them based on your health history. Your caregiver will tell you what side effects to look out for if you choose to be on a medicine or therapy. Carefully read the information on the package. Do not use any other product containing nicotine  while using a nicotine replacement product.  RELAPSE OR DIFFICULT SITUATIONS Most relapses occur within the first 3 months after quitting. Do not be discouraged if you start smoking again. Remember, most people try several times before finally quitting. You may have symptoms of withdrawal because your body is used to nicotine. You may crave cigarettes, be irritable, feel very hungry, cough often, get headaches, or have difficulty concentrating. The withdrawal symptoms are only temporary. They are strongest when you first quit, but they will go away within 10 14 days. To reduce the chances of relapse, try to:  Avoid drinking alcohol. Drinking lowers your chances of successfully quitting.  Reduce the amount of caffeine you consume. Once you quit smoking, the amount of caffeine in your body increases and can give you symptoms, such as a rapid heartbeat, sweating, and anxiety.  Avoid smokers because they can make you want to smoke.  Do not let weight gain distract you. Many smokers will gain weight when they quit, usually less than 10 pounds. Eat a healthy diet and stay active. You  can always lose the weight gained after you quit.  Find ways to improve your mood other than smoking. FOR MORE INFORMATION  www.smokefree.gov  Document Released: 08/19/2001 Document Revised: 02/24/2012 Document Reviewed: 12/04/2011 [facility] Patient Information 2014 [place], [place].   Thank you for choosing [facility] to provide your oncology and hematology care.  To afford each patient quality time with our providers, please arrive at least 15 minutes before your scheduled appointment time.  With your help, our goal is to use those 15 minutes to complete the necessary work-up to ensure our physicians have the information they need to help with your evaluation and healthcare recommendations.    Effective January 1st, 2014, we ask that you re-schedule your appointment with our physicians should you arrive 10  or more minutes late for your appointment.  We strive to give you quality time with our providers, and arriving late affects you and other patients whose appointments are after yours.    Again, thank you for choosing [facility].  Our hope is that these requests will decrease the amount of time that you wait before being seen by our physicians.       _____________________________________________________________  Should you have questions after your visit to [facility], please contact our office at [phone] between the hours of 8:30 a.m. and 5:00 p.m.  Voicemails left after 4:30 p.m. will not be returned until the following business day.  For prescription refill requests, have your pharmacy contact our office with your prescription refill request.

## 2013-06-03 NOTE — Progress Notes (Signed)
Quick Note:  Please let patient know that her gallbladder function is normal. How she doing? Recommend office visit with Dr. Jena Gauss for right upper quadrant pain. ______

## 2013-06-04 ENCOUNTER — Encounter: Payer: Self-pay | Admitting: Internal Medicine

## 2013-07-01 ENCOUNTER — Ambulatory Visit: Payer: Medicaid Other | Admitting: Internal Medicine

## 2013-07-05 ENCOUNTER — Encounter (INDEPENDENT_AMBULATORY_CARE_PROVIDER_SITE_OTHER): Payer: Self-pay

## 2013-07-05 ENCOUNTER — Encounter
Payer: Medicaid Other | Attending: Physical Medicine and Rehabilitation | Admitting: Physical Medicine and Rehabilitation

## 2013-07-05 ENCOUNTER — Encounter: Payer: Self-pay | Admitting: Physical Medicine and Rehabilitation

## 2013-07-05 VITALS — BP 150/71 | HR 92 | Resp 14 | Ht 65.0 in | Wt 245.0 lb

## 2013-07-05 DIAGNOSIS — M76899 Other specified enthesopathies of unspecified lower limb, excluding foot: Secondary | ICD-10-CM

## 2013-07-05 DIAGNOSIS — M79609 Pain in unspecified limb: Secondary | ICD-10-CM | POA: Insufficient documentation

## 2013-07-05 DIAGNOSIS — G8929 Other chronic pain: Secondary | ICD-10-CM | POA: Insufficient documentation

## 2013-07-05 DIAGNOSIS — M7062 Trochanteric bursitis, left hip: Secondary | ICD-10-CM

## 2013-07-05 DIAGNOSIS — M47814 Spondylosis without myelopathy or radiculopathy, thoracic region: Secondary | ICD-10-CM | POA: Insufficient documentation

## 2013-07-05 DIAGNOSIS — M25552 Pain in left hip: Secondary | ICD-10-CM

## 2013-07-05 DIAGNOSIS — Z79899 Other long term (current) drug therapy: Secondary | ICD-10-CM | POA: Insufficient documentation

## 2013-07-05 DIAGNOSIS — M47817 Spondylosis without myelopathy or radiculopathy, lumbosacral region: Secondary | ICD-10-CM

## 2013-07-05 DIAGNOSIS — M25559 Pain in unspecified hip: Secondary | ICD-10-CM

## 2013-07-05 DIAGNOSIS — M25519 Pain in unspecified shoulder: Secondary | ICD-10-CM | POA: Insufficient documentation

## 2013-07-05 MED ORDER — HYDROCODONE-ACETAMINOPHEN 10-325 MG PO TABS: 1.0000 | ORAL_TABLET | Freq: Three times a day (TID) | ORAL | Status: DC

## 2013-07-05 MED ORDER — DICLOFENAC SODIUM 1 % TD GEL: 2.0000 g | Freq: Four times a day (QID) | TRANSDERMAL | Status: DC

## 2013-07-05 NOTE — Patient Instructions (Addendum)
Try to continue with your exercises in a lying position. Try to get back into aquatic exercises
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
## 2013-07-05 NOTE — Addendum Note (Signed)
Addended by: Su Monks on: 07/05/2013 09:15 AM   Modules accepted: Level of Service

## 2013-07-11 ENCOUNTER — Ambulatory Visit: Payer: Medicaid Other | Admitting: Physician Assistant

## 2013-07-11 ENCOUNTER — Encounter: Payer: Self-pay | Admitting: Physician Assistant

## 2013-07-11 ENCOUNTER — Ambulatory Visit (INDEPENDENT_AMBULATORY_CARE_PROVIDER_SITE_OTHER): Payer: Medicaid Other | Admitting: Physician Assistant

## 2013-07-11 VITALS — BP 134/86 | HR 100 | Temp 98.8°F | Resp 18 | Wt 245.0 lb

## 2013-07-11 DIAGNOSIS — F17200 Nicotine dependence, unspecified, uncomplicated: Secondary | ICD-10-CM | POA: Insufficient documentation

## 2013-07-11 DIAGNOSIS — F329 Major depressive disorder, single episode, unspecified: Secondary | ICD-10-CM

## 2013-07-11 DIAGNOSIS — E039 Hypothyroidism, unspecified: Secondary | ICD-10-CM

## 2013-07-11 DIAGNOSIS — K219 Gastro-esophageal reflux disease without esophagitis: Secondary | ICD-10-CM | POA: Insufficient documentation

## 2013-07-11 DIAGNOSIS — E032 Hypothyroidism due to medicaments and other exogenous substances: Secondary | ICD-10-CM | POA: Insufficient documentation

## 2013-07-11 DIAGNOSIS — J45909 Unspecified asthma, uncomplicated: Secondary | ICD-10-CM | POA: Insufficient documentation

## 2013-07-11 DIAGNOSIS — E119 Type 2 diabetes mellitus without complications: Secondary | ICD-10-CM

## 2013-07-11 DIAGNOSIS — R109 Unspecified abdominal pain: Secondary | ICD-10-CM

## 2013-07-11 DIAGNOSIS — J449 Chronic obstructive pulmonary disease, unspecified: Secondary | ICD-10-CM

## 2013-07-11 DIAGNOSIS — M810 Age-related osteoporosis without current pathological fracture: Secondary | ICD-10-CM

## 2013-07-11 DIAGNOSIS — E785 Hyperlipidemia, unspecified: Secondary | ICD-10-CM | POA: Insufficient documentation

## 2013-07-11 DIAGNOSIS — R1030 Lower abdominal pain, unspecified: Secondary | ICD-10-CM

## 2013-07-11 DIAGNOSIS — J309 Allergic rhinitis, unspecified: Secondary | ICD-10-CM | POA: Insufficient documentation

## 2013-07-11 DIAGNOSIS — Z86010 Personal history of colonic polyps: Secondary | ICD-10-CM

## 2013-07-11 HISTORY — DX: Hypothyroidism, unspecified: E03.9

## 2013-07-11 LAB — HEMOGLOBIN A1C
Hgb A1c MFr Bld: 6.8 % — ABNORMAL HIGH (ref <5.7)
Mean Plasma Glucose: 148 mg/dL — ABNORMAL HIGH (ref <117)

## 2013-07-11 LAB — LIPID PANEL
Cholesterol: 154 mg/dL (ref 0–200)
HDL: 26 mg/dL — ABNORMAL LOW (ref >39)
Total CHOL/HDL Ratio: 5.9 Ratio
Triglycerides: 573 mg/dL — ABNORMAL HIGH (ref <150)

## 2013-07-11 LAB — URINALYSIS, ROUTINE W REFLEX MICROSCOPIC
Bilirubin Urine: NEGATIVE
Glucose, UA: NEGATIVE mg/dL
Leukocytes, UA: NEGATIVE
Specific Gravity, Urine: 1.021 (ref 1.005–1.030)
pH: 6 (ref 5.0–8.0)

## 2013-07-11 LAB — COMPLETE METABOLIC PANEL WITH GFR
ALT: 18 U/L (ref 0–35)
AST: 14 U/L (ref 0–37)
Alkaline Phosphatase: 61 U/L (ref 39–117)
Calcium: 9.4 mg/dL (ref 8.4–10.5)
Chloride: 100 mEq/L (ref 96–112)
Creat: 0.89 mg/dL (ref 0.50–1.10)

## 2013-07-11 LAB — TSH: TSH: 4.306 u[IU]/mL (ref 0.350–4.500)

## 2013-07-11 MED ORDER — BUPROPION HCL ER (SR) 150 MG PO TB12: 150.0000 mg | ORAL_TABLET | Freq: Two times a day (BID) | ORAL | Status: DC
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
## 2013-07-12 DIAGNOSIS — M79609 Pain in unspecified limb: Secondary | ICD-10-CM

## 2013-07-12 DIAGNOSIS — M169 Osteoarthritis of hip, unspecified: Secondary | ICD-10-CM

## 2013-07-12 DIAGNOSIS — R32 Unspecified urinary incontinence: Secondary | ICD-10-CM

## 2013-07-12 DIAGNOSIS — M1710 Unilateral primary osteoarthritis, unspecified knee: Secondary | ICD-10-CM

## 2013-07-13 LAB — URINE CULTURE: Colony Count: >=100000

## 2013-07-15 ENCOUNTER — Telehealth: Payer: Self-pay | Admitting: *Deleted

## 2013-07-15 NOTE — Telephone Encounter (Signed)
Patient was Wellbutrin and was taken off of Lexapro . Now she is has rash, red whelps . She stopped medicine and stated back on her Lexapro.Marland Kitchen

## 2013-07-18 ENCOUNTER — Telehealth: Payer: Self-pay | Admitting: Family Medicine

## 2013-07-18 ENCOUNTER — Ambulatory Visit: Payer: Medicaid Other | Admitting: Family Medicine

## 2013-07-18 NOTE — Telephone Encounter (Signed)
That is fine that she stopped Wellbutrin and is on Lexapro.  Go in and add Wellbutrin under "allergies" and document this reaction--I do not know how to go in and abstract.

## 2013-07-18 NOTE — Telephone Encounter (Signed)
Pt is taking Benadryl and she still has rash. Informed pt that she ntbs and appt made

## 2013-07-18 NOTE — Telephone Encounter (Signed)
NTBS for prednisone 

## 2013-07-18 NOTE — Telephone Encounter (Signed)
Patient started Wellbutrin onTuesday she woke up Friday morning and had whelps all over . She stopped taking the Wellbutrin. Used Epie Pen and her rescue inhaler . Breathing is better . Home Nurse came by and accessed her and said that she may need some  Prednisone . Patient had an appointment this afternoon @ 2:15  But, was unable to make it due to transportation conflict.   [employer]

## 2013-07-19 ENCOUNTER — Encounter: Payer: Self-pay | Admitting: Family Medicine

## 2013-07-19 ENCOUNTER — Ambulatory Visit (INDEPENDENT_AMBULATORY_CARE_PROVIDER_SITE_OTHER): Payer: Medicaid Other | Admitting: Family Medicine

## 2013-07-19 VITALS — BP 140/80 | HR 68 | Temp 98.5°F | Resp 20

## 2013-07-19 DIAGNOSIS — F329 Major depressive disorder, single episode, unspecified: Secondary | ICD-10-CM

## 2013-07-19 DIAGNOSIS — F3289 Other specified depressive episodes: Secondary | ICD-10-CM

## 2013-07-19 DIAGNOSIS — F32A Depression, unspecified: Secondary | ICD-10-CM

## 2013-07-19 DIAGNOSIS — T7840XA Allergy, unspecified, initial encounter: Secondary | ICD-10-CM

## 2013-07-19 MED ORDER — PREDNISONE 10 MG PO TABS: 10.0000 mg | ORAL_TABLET | Freq: Two times a day (BID) | ORAL | Status: DC

## 2013-07-19 NOTE — Patient Instructions (Signed)
Take the low dose prednisone twice a day for the next 5 days Continue benadryl and your allergy pill Try aveeno oatmeal bath F/U as previous

## 2013-07-20 DIAGNOSIS — T7840XA Allergy, unspecified, initial encounter: Secondary | ICD-10-CM | POA: Insufficient documentation

## 2013-07-20 NOTE — Assessment & Plan Note (Signed)
She will continue with her Lexapro to trazodone. She does not want to change any medications at this time

## 2013-07-20 NOTE — Assessment & Plan Note (Signed)
Allergic reaction to the well future. She is restart her Lexapro which she was previously on. She does not want to try any other medications for her mood. I will give her a short course of prednisone low dose to help resolve the rest of the rash. She's also continue the Benadryl she is are ready on Claritin

## 2013-07-20 NOTE — Progress Notes (Signed)
  Subjective:    Patient ID: Lisa Ewing, female    DOB: [birth year], 57 y.o.   MRN: [id number]  HPI Patient here secondary to allergic reaction. She was started on Wellbutrin on November 3 on the second day of the medication she noticed a red itchy rash it started on her legs and then spread to her chest her arms and even of her face. She discontinued the medication started using Benadryl. She states that the rash became very severe and this started to miss with her asthma therefore she did give herself an injection with an EpiPen last week. She's also been using her nebulizers which her breathing has now calmed down. The rash has resolved some but she still has lesions across her chest and around her neck which still itch.  Note she did restart her Lexapro   Review of Systems  - per above  GEN- denies fatigue, fever, weight loss,weakness, recent illness HEENT- denies eye drainage, change in vision, nasal discharge, CVS- denies chest pain, palpitations RESP- denies SOB, cough, wheeze ABD- denies N/V, change in stools, abd pain Neuro- denies headache, dizziness, syncope, seizure activity      Objective:   Physical Exam GEN- NAD, alert and oriented x3 HEENT- PERRL, EOMI, non injected sclera, pink conjunctiva, MMM, oropharynx clear, no oral lesions CVS- RRR, no murmur RESP-CTAB, normal WOB EXT- pedal edema Pulses- Radial 2+ Skin- erythematous fine maculopapular lesions scattered on legs, urticarial lesions on back of neck and antierior to left pinna, few lesions scattered across breast.         Assessment & Plan:

## 2013-07-21 ENCOUNTER — Encounter: Payer: Self-pay | Admitting: *Deleted

## 2013-07-21 ENCOUNTER — Other Ambulatory Visit (HOSPITAL_COMMUNITY): Payer: Medicaid Other

## 2013-07-21 ENCOUNTER — Ambulatory Visit (HOSPITAL_COMMUNITY)
Admission: RE | Admit: 2013-07-21 | Discharge: 2013-07-21 | Disposition: A | Discharge status: Home / Self Care | Payer: Medicaid Other | Source: Ambulatory Visit | Attending: Physician Assistant | Admitting: Physician Assistant

## 2013-07-21 DIAGNOSIS — Z780 Asymptomatic menopausal state: Secondary | ICD-10-CM | POA: Insufficient documentation

## 2013-07-21 DIAGNOSIS — M899 Disorder of bone, unspecified: Secondary | ICD-10-CM | POA: Insufficient documentation

## 2013-07-21 DIAGNOSIS — M810 Age-related osteoporosis without current pathological fracture: Secondary | ICD-10-CM

## 2013-07-21 NOTE — Telephone Encounter (Signed)
Allergies updated.  

## 2013-07-26 ENCOUNTER — Encounter: Payer: Self-pay | Admitting: Family Medicine

## 2013-07-26 NOTE — Telephone Encounter (Signed)
Patient would like to know what the results of her bone density test?

## 2013-07-29 NOTE — Telephone Encounter (Signed)
This encounter was created in error - please disregard.

## 2013-08-02 ENCOUNTER — Ambulatory Visit: Payer: Medicaid Other | Admitting: Family Medicine

## 2013-08-09 ENCOUNTER — Telehealth: Payer: Self-pay | Admitting: Physical Medicine and Rehabilitation

## 2013-08-09 ENCOUNTER — Encounter: Payer: Self-pay | Admitting: Physical Medicine and Rehabilitation

## 2013-08-09 ENCOUNTER — Encounter
Payer: Medicaid Other | Attending: Physical Medicine and Rehabilitation | Admitting: Physical Medicine and Rehabilitation

## 2013-08-09 VITALS — BP 152/78 | HR 103 | Resp 16 | Ht 65.0 in | Wt 245.0 lb

## 2013-08-09 DIAGNOSIS — M25519 Pain in unspecified shoulder: Secondary | ICD-10-CM | POA: Insufficient documentation

## 2013-08-09 DIAGNOSIS — M25559 Pain in unspecified hip: Secondary | ICD-10-CM

## 2013-08-09 DIAGNOSIS — M79609 Pain in unspecified limb: Secondary | ICD-10-CM | POA: Insufficient documentation

## 2013-08-09 DIAGNOSIS — R29898 Other symptoms and signs involving the musculoskeletal system: Secondary | ICD-10-CM | POA: Insufficient documentation

## 2013-08-09 DIAGNOSIS — M76899 Other specified enthesopathies of unspecified lower limb, excluding foot: Secondary | ICD-10-CM | POA: Insufficient documentation

## 2013-08-09 DIAGNOSIS — Z5181 Encounter for therapeutic drug level monitoring: Secondary | ICD-10-CM

## 2013-08-09 DIAGNOSIS — M47817 Spondylosis without myelopathy or radiculopathy, lumbosacral region: Secondary | ICD-10-CM | POA: Insufficient documentation

## 2013-08-09 DIAGNOSIS — Z79899 Other long term (current) drug therapy: Secondary | ICD-10-CM

## 2013-08-09 DIAGNOSIS — G894 Chronic pain syndrome: Secondary | ICD-10-CM

## 2013-08-09 DIAGNOSIS — R269 Unspecified abnormalities of gait and mobility: Secondary | ICD-10-CM | POA: Insufficient documentation

## 2013-08-09 MED ORDER — HYDROCODONE-ACETAMINOPHEN 10-325 MG PO TABS: 1.0000 | ORAL_TABLET | Freq: Three times a day (TID) | ORAL | Status: DC

## 2013-08-09 NOTE — Telephone Encounter (Signed)
Ok, will call her back
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
## 2013-08-09 NOTE — Patient Instructions (Signed)
Try to stay as active as tolerated 

## 2013-08-09 NOTE — Telephone Encounter (Signed)
Patient would like for you to call her back in reference something you were talking about in your visit with her this morning.

## 2013-08-10 ENCOUNTER — Telehealth: Payer: Self-pay

## 2013-08-10 NOTE — Telephone Encounter (Signed)
i will call her back 

## 2013-08-10 NOTE — Telephone Encounter (Signed)
Patient called and wanted to talk to Clydie Braun regarding some things they discussed at her visit.  Spoke with patient to see if there was something I could help her with but she request Clydie Braun call her.  Advised patient Clydie Braun is seeing patients and that today is the last day at our practice.  She wanted me to send her the message anyway.

## 2013-08-15 ENCOUNTER — Ambulatory Visit (INDEPENDENT_AMBULATORY_CARE_PROVIDER_SITE_OTHER): Payer: Medicaid Other | Admitting: Family Medicine

## 2013-08-15 ENCOUNTER — Encounter: Payer: Self-pay | Admitting: Family Medicine

## 2013-08-15 VITALS — BP 122/64 | HR 98 | Temp 98.4°F | Resp 20 | Ht 65.0 in | Wt 245.0 lb

## 2013-08-15 DIAGNOSIS — R29898 Other symptoms and signs involving the musculoskeletal system: Secondary | ICD-10-CM
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
## 2013-08-18 ENCOUNTER — Other Ambulatory Visit (HOSPITAL_COMMUNITY): Payer: Self-pay | Admitting: Family Medicine

## 2013-08-18 NOTE — Telephone Encounter (Signed)
Medication refilled per protocol. 

## 2013-08-23 ENCOUNTER — Encounter: Payer: Self-pay | Admitting: Family Medicine

## 2013-08-23 NOTE — Telephone Encounter (Signed)
Pt is calling back she had a missed call before she could get to the phone she had missed it Call back number is [phone]

## 2013-08-23 NOTE — Telephone Encounter (Signed)
I see no reason why we would have called this pt

## 2013-08-29 ENCOUNTER — Other Ambulatory Visit: Payer: Self-pay | Admitting: *Deleted

## 2013-08-29 MED ORDER — HYDROCODONE-ACETAMINOPHEN 10-325 MG PO TABS: 1.0000 | ORAL_TABLET | Freq: Three times a day (TID) | ORAL | Status: DC

## 2013-08-29 NOTE — Telephone Encounter (Signed)
RX printed early for controlled medication for the visit with RN on 09/09/13 (to be signed by MD) 

## 2013-08-30 NOTE — Telephone Encounter (Signed)
This encounter was created in error - please disregard.

## 2013-09-09 ENCOUNTER — Encounter: Payer: Medicaid Other | Attending: Physical Medicine & Rehabilitation | Admitting: *Deleted

## 2013-09-09 ENCOUNTER — Encounter: Payer: Self-pay | Admitting: *Deleted

## 2013-09-09 VITALS — BP 150/74 | HR 91 | Resp 14

## 2013-09-09 DIAGNOSIS — M79606 Pain in leg, unspecified: Secondary | ICD-10-CM

## 2013-09-09 DIAGNOSIS — G8929 Other chronic pain: Secondary | ICD-10-CM | POA: Insufficient documentation

## 2013-09-09 DIAGNOSIS — Z79899 Other long term (current) drug therapy: Secondary | ICD-10-CM | POA: Insufficient documentation

## 2013-09-09 DIAGNOSIS — M79609 Pain in unspecified limb: Secondary | ICD-10-CM | POA: Insufficient documentation

## 2013-09-09 DIAGNOSIS — M545 Low back pain, unspecified: Secondary | ICD-10-CM | POA: Insufficient documentation

## 2013-09-09 DIAGNOSIS — M25511 Pain in right shoulder: Secondary | ICD-10-CM

## 2013-09-09 DIAGNOSIS — M542 Cervicalgia: Secondary | ICD-10-CM | POA: Insufficient documentation

## 2013-09-09 DIAGNOSIS — M546 Pain in thoracic spine: Secondary | ICD-10-CM | POA: Insufficient documentation

## 2013-09-09 DIAGNOSIS — R269 Unspecified abnormalities of gait and mobility: Secondary | ICD-10-CM | POA: Insufficient documentation

## 2013-09-09 DIAGNOSIS — M25519 Pain in unspecified shoulder: Secondary | ICD-10-CM | POA: Insufficient documentation

## 2013-09-09 DIAGNOSIS — M25512 Pain in left shoulder: Secondary | ICD-10-CM

## 2013-09-09 NOTE — Patient Instructions (Signed)
Follow up one month with RN for pill count and med refill and 2 months with Dr Letta Pate or Algis Liming PA

## 2013-09-09 NOTE — Progress Notes (Signed)
Here for pill count and medication refills. Hydrocodone 10-325 # 90 Fill date 08/10/13   Today NV# 5  VSS    Pain level:8 No change in pain or medication list.  Follow up in one month with RN for med refill and pillcount and 2 months with Dr Letta Pate or Algis Liming PA

## 2013-09-09 NOTE — Progress Notes (Signed)
REVIEWED.  

## 2013-09-15 ENCOUNTER — Other Ambulatory Visit (HOSPITAL_COMMUNITY): Payer: Self-pay | Admitting: Family Medicine

## 2013-09-15 ENCOUNTER — Other Ambulatory Visit: Payer: Self-pay | Admitting: Physical Medicine & Rehabilitation

## 2013-09-16 ENCOUNTER — Telehealth: Payer: Self-pay | Admitting: Family Medicine

## 2013-09-16 MED ORDER — PANTOPRAZOLE SODIUM 40 MG PO TBEC: 40.0000 mg | DELAYED_RELEASE_TABLET | Freq: Every day | ORAL | Status: DC

## 2013-09-16 NOTE — Telephone Encounter (Signed)
PT is calling today because Medicaid want audit the Nexium  Pt can not afford it and she is wanting to know if there could be something else she could use If so call it in to the [place] back number is [phone]

## 2013-09-16 NOTE — Telephone Encounter (Signed)
MCD will cover Protonix - rx sent into pharm.

## 2013-09-28 ENCOUNTER — Telehealth: Payer: Self-pay | Admitting: Family Medicine

## 2013-09-28 DIAGNOSIS — J309 Allergic rhinitis, unspecified: Secondary | ICD-10-CM

## 2013-09-28 NOTE — Telephone Encounter (Signed)
Pharmacy requested PA for Astelin nasal spray.  Provider discontinued this medication last year.

## 2013-10-03 ENCOUNTER — Other Ambulatory Visit: Payer: Self-pay | Admitting: *Deleted

## 2013-10-03 MED ORDER — HYDROCODONE-ACETAMINOPHEN 10-325 MG PO TABS: 1.0000 | ORAL_TABLET | Freq: Three times a day (TID) | ORAL | Status: DC

## 2013-10-03 NOTE — Telephone Encounter (Signed)
RX printed early for controlled medication for the visit with RN on 10/07/13 (to be signed by MD)

## 2013-10-07 ENCOUNTER — Encounter: Payer: Self-pay | Admitting: *Deleted

## 2013-10-07 ENCOUNTER — Encounter: Payer: Medicaid Other | Attending: Physical Medicine & Rehabilitation | Admitting: *Deleted

## 2013-10-07 VITALS — BP 150/79 | HR 107 | Resp 16

## 2013-10-07 DIAGNOSIS — M545 Low back pain, unspecified: Secondary | ICD-10-CM | POA: Insufficient documentation

## 2013-10-07 DIAGNOSIS — W010XXA Fall on same level from slipping, tripping and stumbling without subsequent striking against object, initial encounter: Secondary | ICD-10-CM | POA: Insufficient documentation

## 2013-10-07 DIAGNOSIS — M79606 Pain in leg, unspecified: Secondary | ICD-10-CM

## 2013-10-07 DIAGNOSIS — M79609 Pain in unspecified limb: Secondary | ICD-10-CM | POA: Insufficient documentation

## 2013-10-07 DIAGNOSIS — M542 Cervicalgia: Secondary | ICD-10-CM | POA: Insufficient documentation

## 2013-10-07 DIAGNOSIS — G8929 Other chronic pain: Secondary | ICD-10-CM | POA: Insufficient documentation

## 2013-10-07 DIAGNOSIS — Y92009 Unspecified place in unspecified non-institutional (private) residence as the place of occurrence of the external cause: Secondary | ICD-10-CM | POA: Insufficient documentation

## 2013-10-07 DIAGNOSIS — R269 Unspecified abnormalities of gait and mobility: Secondary | ICD-10-CM | POA: Insufficient documentation

## 2013-10-07 DIAGNOSIS — Z79899 Other long term (current) drug therapy: Secondary | ICD-10-CM | POA: Insufficient documentation
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
## 2013-10-07 NOTE — Patient Instructions (Signed)
Follow up in one month with Dr Letta Pate.  You have an appt scheduled for 11/07/13 @ 10:30

## 2013-10-11 ENCOUNTER — Ambulatory Visit (INDEPENDENT_AMBULATORY_CARE_PROVIDER_SITE_OTHER): Payer: Medicaid Other | Admitting: Family Medicine

## 2013-10-11 ENCOUNTER — Encounter: Payer: Self-pay | Admitting: Family Medicine

## 2013-10-11 VITALS — BP 110/84 | HR 110 | Temp 98.5°F | Resp 24 | Ht 65.0 in | Wt 245.0 lb

## 2013-10-11 DIAGNOSIS — J209 Acute bronchitis, unspecified: Secondary | ICD-10-CM

## 2013-10-11 DIAGNOSIS — R509 Fever, unspecified: Secondary | ICD-10-CM

## 2013-10-11 LAB — INFLUENZA A AND B
INFLUENZA A AG: NEGATIVE
INFLUENZA B AG: NEGATIVE

## 2013-10-11 MED ORDER — AZITHROMYCIN 250 MG PO TABS: ORAL_TABLET | ORAL | Status: DC

## 2013-10-11 MED ORDER — TRIAMCINOLONE ACETONIDE 0.1 % EX CREA: 1.0000 "application " | TOPICAL_CREAM | Freq: Two times a day (BID) | CUTANEOUS | Status: DC
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
## 2013-10-12 ENCOUNTER — Inpatient Hospital Stay (HOSPITAL_COMMUNITY)
Admission: EM | Admit: 2013-10-12 | Discharge: 2013-10-14 | DRG: 190 | Disposition: A | Discharge status: Home / Self Care | Payer: Medicaid Other | Attending: Family Medicine | Admitting: Family Medicine

## 2013-10-12 ENCOUNTER — Emergency Department (HOSPITAL_COMMUNITY): Payer: Medicaid Other

## 2013-10-12 ENCOUNTER — Encounter (HOSPITAL_COMMUNITY): Payer: Self-pay | Admitting: Emergency Medicine

## 2013-10-12 DIAGNOSIS — M810 Age-related osteoporosis without current pathological fracture: Secondary | ICD-10-CM | POA: Diagnosis present

## 2013-10-12 DIAGNOSIS — K219 Gastro-esophageal reflux disease without esophagitis: Secondary | ICD-10-CM | POA: Diagnosis present

## 2013-10-12 DIAGNOSIS — Z23 Encounter for immunization: Secondary | ICD-10-CM

## 2013-10-12 DIAGNOSIS — T380X5A Adverse effect of glucocorticoids and synthetic analogues, initial encounter: Secondary | ICD-10-CM | POA: Diagnosis present

## 2013-10-12 DIAGNOSIS — K5909 Other constipation: Secondary | ICD-10-CM

## 2013-10-12 DIAGNOSIS — M545 Low back pain, unspecified: Secondary | ICD-10-CM | POA: Diagnosis present

## 2013-10-12 DIAGNOSIS — Z91040 Latex allergy status: Secondary | ICD-10-CM

## 2013-10-12 DIAGNOSIS — Z91013 Allergy to seafood: Secondary | ICD-10-CM

## 2013-10-12 DIAGNOSIS — J449 Chronic obstructive pulmonary disease, unspecified: Secondary | ICD-10-CM | POA: Diagnosis present

## 2013-10-12 DIAGNOSIS — J440 Chronic obstructive pulmonary disease with acute lower respiratory infection: Secondary | ICD-10-CM | POA: Diagnosis present

## 2013-10-12 DIAGNOSIS — E785 Hyperlipidemia, unspecified: Secondary | ICD-10-CM | POA: Diagnosis present

## 2013-10-12 DIAGNOSIS — G4733 Obstructive sleep apnea (adult) (pediatric): Secondary | ICD-10-CM | POA: Diagnosis present

## 2013-10-12 DIAGNOSIS — T7840XA Allergy, unspecified, initial encounter: Secondary | ICD-10-CM

## 2013-10-12 DIAGNOSIS — Z8542 Personal history of malignant neoplasm of other parts of uterus: Secondary | ICD-10-CM

## 2013-10-12 DIAGNOSIS — M546 Pain in thoracic spine: Secondary | ICD-10-CM

## 2013-10-12 DIAGNOSIS — R269 Unspecified abnormalities of gait and mobility: Secondary | ICD-10-CM | POA: Diagnosis present

## 2013-10-12 DIAGNOSIS — M542 Cervicalgia: Secondary | ICD-10-CM

## 2013-10-12 DIAGNOSIS — F17200 Nicotine dependence, unspecified, uncomplicated: Secondary | ICD-10-CM | POA: Diagnosis present

## 2013-10-12 DIAGNOSIS — E032 Hypothyroidism due to medicaments and other exogenous substances: Secondary | ICD-10-CM | POA: Diagnosis present

## 2013-10-12 DIAGNOSIS — F32A Depression, unspecified: Secondary | ICD-10-CM

## 2013-10-12 DIAGNOSIS — K589 Irritable bowel syndrome without diarrhea: Secondary | ICD-10-CM

## 2013-10-12 DIAGNOSIS — F3289 Other specified depressive episodes: Secondary | ICD-10-CM | POA: Diagnosis present

## 2013-10-12 DIAGNOSIS — J40 Bronchitis, not specified as acute or chronic: Secondary | ICD-10-CM

## 2013-10-12 DIAGNOSIS — Z86010 Personal history of colonic polyps: Secondary | ICD-10-CM

## 2013-10-12 DIAGNOSIS — M25512 Pain in left shoulder: Secondary | ICD-10-CM

## 2013-10-12 DIAGNOSIS — Z6841 Body Mass Index (BMI) 40.0 and over, adult: Secondary | ICD-10-CM

## 2013-10-12 DIAGNOSIS — E119 Type 2 diabetes mellitus without complications: Secondary | ICD-10-CM | POA: Diagnosis present

## 2013-10-12 DIAGNOSIS — J9600 Acute respiratory failure, unspecified whether with hypoxia or hypercapnia: Secondary | ICD-10-CM | POA: Diagnosis present

## 2013-10-12 DIAGNOSIS — M25511 Pain in right shoulder: Secondary | ICD-10-CM

## 2013-10-12 DIAGNOSIS — Z8541 Personal history of malignant neoplasm of cervix uteri: Secondary | ICD-10-CM

## 2013-10-12 DIAGNOSIS — J209 Acute bronchitis, unspecified: Secondary | ICD-10-CM | POA: Diagnosis present

## 2013-10-12 DIAGNOSIS — Z833 Family history of diabetes mellitus: Secondary | ICD-10-CM

## 2013-10-12 DIAGNOSIS — Z993 Dependence on wheelchair: Secondary | ICD-10-CM

## 2013-10-12 DIAGNOSIS — Z803 Family history of malignant neoplasm of breast: Secondary | ICD-10-CM

## 2013-10-12 DIAGNOSIS — R197 Diarrhea, unspecified: Secondary | ICD-10-CM

## 2013-10-12 DIAGNOSIS — F329 Major depressive disorder, single episode, unspecified: Secondary | ICD-10-CM | POA: Diagnosis present

## 2013-10-12 DIAGNOSIS — R1011 Right upper quadrant pain: Secondary | ICD-10-CM

## 2013-10-12 DIAGNOSIS — M79606 Pain in leg, unspecified: Secondary | ICD-10-CM

## 2013-10-12 DIAGNOSIS — E669 Obesity, unspecified: Secondary | ICD-10-CM | POA: Diagnosis present

## 2013-10-12 DIAGNOSIS — N390 Urinary tract infection, site not specified: Secondary | ICD-10-CM

## 2013-10-12 DIAGNOSIS — D72829 Elevated white blood cell count, unspecified: Secondary | ICD-10-CM

## 2013-10-12 DIAGNOSIS — R1084 Generalized abdominal pain: Secondary | ICD-10-CM

## 2013-10-12 DIAGNOSIS — J441 Chronic obstructive pulmonary disease with (acute) exacerbation: Principal | ICD-10-CM | POA: Diagnosis present

## 2013-10-12 DIAGNOSIS — G8929 Other chronic pain: Secondary | ICD-10-CM | POA: Diagnosis present

## 2013-10-12 DIAGNOSIS — E039 Hypothyroidism, unspecified: Secondary | ICD-10-CM | POA: Diagnosis present

## 2013-10-12 DIAGNOSIS — R000 Tachycardia, unspecified: Secondary | ICD-10-CM | POA: Diagnosis present

## 2013-10-12 DIAGNOSIS — J45901 Unspecified asthma with (acute) exacerbation: Principal | ICD-10-CM

## 2013-10-12 DIAGNOSIS — Z79899 Other long term (current) drug therapy: Secondary | ICD-10-CM

## 2013-10-12 LAB — CBC WITH DIFFERENTIAL/PLATELET
BASOS ABS: 0 10*3/uL (ref 0.0–0.1)
Basophils Relative: 0 % (ref 0–1)
Eosinophils Absolute: 0 10*3/uL (ref 0.0–0.7)
Eosinophils Relative: 1 % (ref 0–5)
HCT: 43.1 % (ref 36.0–46.0)
Hemoglobin: 14.1 g/dL (ref 12.0–15.0)
LYMPHS ABS: 1.3 10*3/uL (ref 0.7–4.0)
Lymphocytes Relative: 21 % (ref 12–46)
MCH: 28.4 pg (ref 26.0–34.0)
MCHC: 32.7 g/dL (ref 30.0–36.0)
MCV: 86.9 fL (ref 78.0–100.0)
MONO ABS: 0.9 10*3/uL (ref 0.1–1.0)
Monocytes Relative: 14 % — ABNORMAL HIGH (ref 3–12)
NEUTROS ABS: 4.1 10*3/uL (ref 1.7–7.7)
Neutrophils Relative %: 65 % (ref 43–77)
Platelets: 228 10*3/uL (ref 150–400)
RBC: 4.96 MIL/uL (ref 3.87–5.11)
RDW: 15.8 % — AB (ref 11.5–15.5)
WBC: 6.3 10*3/uL (ref 4.0–10.5)

## 2013-10-12 LAB — GLUCOSE, CAPILLARY
Glucose-Capillary: 237 mg/dL — ABNORMAL HIGH (ref 70–99)
Glucose-Capillary: 286 mg/dL — ABNORMAL HIGH (ref 70–99)

## 2013-10-12 LAB — HEMOGLOBIN A1C
Hgb A1c MFr Bld: 7 % — ABNORMAL HIGH (ref <5.7)
Mean Plasma Glucose: 154 mg/dL — ABNORMAL HIGH (ref <117)

## 2013-10-12 LAB — COMPREHENSIVE METABOLIC PANEL
ALT: 42 U/L — AB (ref 0–35)
AST: 36 U/L (ref 0–37)
Albumin: 3.6 g/dL (ref 3.5–5.2)
Alkaline Phosphatase: 61 U/L (ref 39–117)
BUN: 16 mg/dL (ref 6–23)
CHLORIDE: 96 meq/L (ref 96–112)
CO2: 28 meq/L (ref 19–32)
CREATININE: 0.89 mg/dL (ref 0.50–1.10)
Calcium: 9.3 mg/dL (ref 8.4–10.5)
GFR, EST AFRICAN AMERICAN: 82 mL/min — AB (ref >90)
GFR, EST NON AFRICAN AMERICAN: 71 mL/min — AB (ref >90)
GLUCOSE: 189 mg/dL — AB (ref 70–99)
Potassium: 4.3 mEq/L (ref 3.7–5.3)
SODIUM: 138 meq/L (ref 137–147)
Total Protein: 7.3 g/dL (ref 6.0–8.3)

## 2013-10-12 MED ORDER — DARIFENACIN HYDROBROMIDE ER 7.5 MG PO TB24
7.5000 mg | ORAL_TABLET | Freq: Every day | ORAL | Status: DC
  Administered 2013-10-12 – 2013-10-14 (×3): 7.5 mg via ORAL
  Filled 2013-10-12 (×6): qty 1

## 2013-10-12 MED ORDER — GABAPENTIN 300 MG PO CAPS
300.0000 mg | ORAL_CAPSULE | Freq: Three times a day (TID) | ORAL | Status: DC
  Administered 2013-10-12 – 2013-10-14 (×5): 300 mg via ORAL
  Filled 2013-10-12 (×5): qty 1

## 2013-10-12 MED ORDER — AZELASTINE HCL 0.1 % NA SOLN
2.0000 | Freq: Two times a day (BID) | NASAL | Status: DC
  Administered 2013-10-12 – 2013-10-13 (×3): 2 via NASAL
  Filled 2013-10-12: qty 30

## 2013-10-12 MED ORDER — TRAZODONE HCL 50 MG PO TABS
100.0000 mg | ORAL_TABLET | Freq: Every day | ORAL | Status: DC
  Administered 2013-10-12 – 2013-10-13 (×2): 100 mg via ORAL
  Filled 2013-10-12 (×2): qty 2

## 2013-10-12 MED ORDER — GABAPENTIN 300 MG PO CAPS: 300.0000 mg | ORAL_CAPSULE | Freq: Three times a day (TID) | ORAL | Status: DC

## 2013-10-12 MED ORDER — KETOROLAC TROMETHAMINE 60 MG/2ML IM SOLN
60.0000 mg | Freq: Once | INTRAMUSCULAR | Status: AC
  Administered 2013-10-12: 60 mg via INTRAMUSCULAR
  Filled 2013-10-12: qty 2

## 2013-10-12 MED ORDER — DIPHENHYDRAMINE HCL 50 MG/ML IJ SOLN
25.0000 mg | Freq: Once | INTRAMUSCULAR | Status: AC
  Administered 2013-10-12: 25 mg via INTRAMUSCULAR
  Filled 2013-10-12: qty 1

## 2013-10-12 MED ORDER — PANTOPRAZOLE SODIUM 40 MG PO TBEC
40.0000 mg | DELAYED_RELEASE_TABLET | Freq: Every day | ORAL | Status: DC
  Administered 2013-10-12 – 2013-10-14 (×3): 40 mg via ORAL
  Filled 2013-10-12 (×3): qty 1

## 2013-10-12 MED ORDER — SODIUM CHLORIDE 0.9 % IV SOLN: INTRAVENOUS | Status: DC

## 2013-10-12 MED ORDER — GUAIFENESIN-DM 100-10 MG/5ML PO SYRP
5.0000 mL | ORAL_SOLUTION | ORAL | Status: DC | PRN
  Administered 2013-10-13 – 2013-10-14 (×2): 5 mL via ORAL
  Filled 2013-10-12 (×2): qty 5

## 2013-10-12 MED ORDER — ATORVASTATIN CALCIUM 40 MG PO TABS
40.0000 mg | ORAL_TABLET | Freq: Every day | ORAL | Status: DC
  Administered 2013-10-12 – 2013-10-13 (×2): 40 mg via ORAL
  Filled 2013-10-12 (×2): qty 1

## 2013-10-12 MED ORDER — RALOXIFENE HCL 60 MG PO TABS
60.0000 mg | ORAL_TABLET | Freq: Every day | ORAL | Status: DC
  Administered 2013-10-12 – 2013-10-14 (×3): 60 mg via ORAL
  Filled 2013-10-12 (×3): qty 1

## 2013-10-12 MED ORDER — LEVOFLOXACIN IN D5W 500 MG/100ML IV SOLN
500.0000 mg | Freq: Once | INTRAVENOUS | Status: AC
  Administered 2013-10-12: 500 mg via INTRAVENOUS
  Filled 2013-10-12: qty 100

## 2013-10-12 MED ORDER — ONDANSETRON HCL 4 MG/2ML IJ SOLN: 4.0000 mg | Freq: Four times a day (QID) | INTRAMUSCULAR | Status: DC | PRN

## 2013-10-12 MED ORDER — ONDANSETRON HCL 4 MG PO TABS: 4.0000 mg | ORAL_TABLET | Freq: Four times a day (QID) | ORAL | Status: DC | PRN

## 2013-10-12 MED ORDER — ACETAMINOPHEN 650 MG RE SUPP: 650.0000 mg | Freq: Four times a day (QID) | RECTAL | Status: DC | PRN

## 2013-10-12 MED ORDER — HYDROCODONE-ACETAMINOPHEN 10-325 MG PO TABS
1.0000 | ORAL_TABLET | Freq: Three times a day (TID) | ORAL | Status: DC
  Administered 2013-10-12 – 2013-10-14 (×6): 1 via ORAL
  Filled 2013-10-12 (×7): qty 1

## 2013-10-12 MED ORDER — LEVOFLOXACIN IN D5W 750 MG/150ML IV SOLN
750.0000 mg | INTRAVENOUS | Status: DC
  Administered 2013-10-13: 750 mg via INTRAVENOUS
  Filled 2013-10-12 (×2): qty 150

## 2013-10-12 MED ORDER — ENOXAPARIN SODIUM 40 MG/0.4ML ~~LOC~~ SOLN
40.0000 mg | SUBCUTANEOUS | Status: DC
  Administered 2013-10-12 – 2013-10-13 (×2): 40 mg via SUBCUTANEOUS
  Filled 2013-10-12 (×2): qty 0.4

## 2013-10-12 MED ORDER — INSULIN ASPART 100 UNIT/ML ~~LOC~~ SOLN
0.0000 [IU] | Freq: Three times a day (TID) | SUBCUTANEOUS | Status: DC
  Administered 2013-10-13: 3 [IU] via SUBCUTANEOUS

## 2013-10-12 MED ORDER — SODIUM CHLORIDE 0.9 % IV BOLUS (SEPSIS)
700.0000 mL | Freq: Once | INTRAVENOUS | Status: AC
  Administered 2013-10-12: 700 mL via INTRAVENOUS

## 2013-10-12 MED ORDER — LEVALBUTEROL HCL 0.63 MG/3ML IN NEBU
0.6300 mg | INHALATION_SOLUTION | Freq: Four times a day (QID) | RESPIRATORY_TRACT | Status: DC
  Administered 2013-10-12 – 2013-10-14 (×7): 0.63 mg via RESPIRATORY_TRACT
  Filled 2013-10-12 (×7): qty 3

## 2013-10-12 MED ORDER — IPRATROPIUM BROMIDE 0.02 % IN SOLN
0.5000 mg | Freq: Once | RESPIRATORY_TRACT | Status: AC
  Administered 2013-10-12: 0.5 mg via RESPIRATORY_TRACT
  Filled 2013-10-12: qty 2.5

## 2013-10-12 MED ORDER — INSULIN ASPART 100 UNIT/ML ~~LOC~~ SOLN
0.0000 [IU] | Freq: Three times a day (TID) | SUBCUTANEOUS | Status: DC
  Administered 2013-10-12: 7 [IU] via SUBCUTANEOUS

## 2013-10-12 MED ORDER — GABAPENTIN 300 MG PO CAPS
600.0000 mg | ORAL_CAPSULE | Freq: Every day | ORAL | Status: DC
  Administered 2013-10-12 – 2013-10-13 (×2): 600 mg via ORAL
  Filled 2013-10-12 (×2): qty 2

## 2013-10-12 MED ORDER — METHYLPREDNISOLONE SODIUM SUCC 125 MG IJ SOLR
60.0000 mg | Freq: Four times a day (QID) | INTRAMUSCULAR | Status: DC
Start: 2013-10-12 — End: 2013-10-13
  Administered 2013-10-12 – 2013-10-13 (×3): 60 mg via INTRAVENOUS
  Filled 2013-10-12 (×3): qty 2

## 2013-10-12 MED ORDER — FENOFIBRATE 54 MG PO TABS
54.0000 mg | ORAL_TABLET | Freq: Every day | ORAL | Status: DC
  Administered 2013-10-12 – 2013-10-14 (×3): 54 mg via ORAL
  Filled 2013-10-12 (×6): qty 1

## 2013-10-12 MED ORDER — BIOTENE DRY MOUTH MT LIQD
15.0000 mL | Freq: Two times a day (BID) | OROMUCOSAL | Status: DC
  Administered 2013-10-12 – 2013-10-13 (×3): 15 mL via OROMUCOSAL

## 2013-10-12 MED ORDER — METHYLPREDNISOLONE SODIUM SUCC 125 MG IJ SOLR
125.0000 mg | Freq: Once | INTRAMUSCULAR | Status: AC
  Administered 2013-10-12: 125 mg via INTRAMUSCULAR
  Filled 2013-10-12: qty 2

## 2013-10-12 MED ORDER — INSULIN ASPART 100 UNIT/ML ~~LOC~~ SOLN
0.0000 [IU] | Freq: Every day | SUBCUTANEOUS | Status: DC
  Administered 2013-10-12: 3 [IU] via SUBCUTANEOUS

## 2013-10-12 MED ORDER — INSULIN ASPART 100 UNIT/ML ~~LOC~~ SOLN: 0.0000 [IU] | Freq: Every day | SUBCUTANEOUS | Status: DC

## 2013-10-12 MED ORDER — LEVOTHYROXINE SODIUM 25 MCG PO TABS
125.0000 ug | ORAL_TABLET | Freq: Every day | ORAL | Status: DC
  Administered 2013-10-13 – 2013-10-14 (×2): 125 ug via ORAL
  Filled 2013-10-12 (×4): qty 1

## 2013-10-12 MED ORDER — MONTELUKAST SODIUM 10 MG PO TABS
10.0000 mg | ORAL_TABLET | Freq: Every day | ORAL | Status: DC
  Administered 2013-10-12 – 2013-10-13 (×2): 10 mg via ORAL
  Filled 2013-10-12 (×2): qty 1

## 2013-10-12 MED ORDER — IPRATROPIUM BROMIDE 0.02 % IN SOLN
0.5000 mg | Freq: Four times a day (QID) | RESPIRATORY_TRACT | Status: DC
  Administered 2013-10-12 – 2013-10-14 (×7): 0.5 mg via RESPIRATORY_TRACT
  Filled 2013-10-12 (×7): qty 2.5

## 2013-10-12 MED ORDER — ESCITALOPRAM OXALATE 10 MG PO TABS
20.0000 mg | ORAL_TABLET | Freq: Every day | ORAL | Status: DC
  Administered 2013-10-12 – 2013-10-14 (×3): 20 mg via ORAL
  Filled 2013-10-12 (×3): qty 2

## 2013-10-12 MED ORDER — ALBUTEROL (5 MG/ML) CONTINUOUS INHALATION SOLN
15.0000 mg/h | INHALATION_SOLUTION | Freq: Once | RESPIRATORY_TRACT | Status: AC
  Administered 2013-10-12: 15 mg/h via RESPIRATORY_TRACT
  Filled 2013-10-12: qty 20

## 2013-10-12 MED ORDER — ACETAMINOPHEN 325 MG PO TABS: 650.0000 mg | ORAL_TABLET | Freq: Four times a day (QID) | ORAL | Status: DC | PRN

## 2013-10-12 NOTE — ED Notes (Signed)
RT called for breathing tx. 
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
## 2013-10-12 NOTE — ED Notes (Signed)
Admitting NP at bedside
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
## 2013-10-12 NOTE — ED Notes (Signed)
Seen yesterday and diagnosed with bronchitis.  Today wheezing sat 90 on RA.  C/o headache.  Uses home nebulizers.  cbg 215.

## 2013-10-12 NOTE — Progress Notes (Signed)
Inpatient Diabetes Program Recommendations  AACE/ADA: New Consensus Statement on Inpatient Glycemic Control (2013)  Target Ranges:  Prepandial:   less than 140 mg/dL      Peak postprandial:   less than 180 mg/dL (1-2 hours)      Critically ill patients:  140 - 180 mg/dL  Results for ZANA, BIANCARDI (MRN [id number]) as of 10/12/2013 14:57  Ref. Range 07/11/2013 09:15  Hemoglobin A1C Latest Range: <5.7 % 6.8 (H)   Results for VIOLANDA, BOBECK (MRN [id number]) as of 10/12/2013 14:57  Ref. Range 10/12/2013 13:28  Glucose Latest Range: 70-99 mg/dL 189 (H)    Diabetes history: DM2 Outpatient Diabetes medications: None listed on home medication list.  However, noted in office visit note on 08/15/13 by [facility] that patient has Metformin 1000 mg BID prescribed for diabetes control. Current orders for Inpatient glycemic control: None  Inpatient Diabetes Program Recommendations Correction (SSI): Please order CBGs with Novolog correction ACHS while inpatient. HgbA1C: Please consider ordering an A1C to evaluate glycemic control over the past 2-3 months. Diet: Please discontinue regular diet and order Carb Modified Diabetic diet.  Thanks, Barnie Alderman, RN, MSN, CCRN Diabetes Coordinator Inpatient Diabetes Program [phone] (Team Pager) [phone] ([facility] office) [phone] [facility] office)

## 2013-10-12 NOTE — H&P (Signed)
Patient seen, independently examined and chart reviewed. I agree with exam, assessment and plan discussed with Dyanne Carrel, NP.  58 year old woman recently diagnosed with bronchitis by her primary care physician started on azithromycin, took first dose yesterday. Presented emergency department with complaints of fever, wheezing, shortness of breath. Uses nebulizer machine at home regularly. She was treated with Levaquin, Solu-Medrol and referred for admission for presumed COPD exacerbation.  PMH Obstructive sleep apnea Chronic pain COPD  diabetes mellitus Tobacco dependence  Overall she is feeling somewhat better over the last several hours with improved breathing.  Afebrile, tachycardic, vitals otherwise stable. Oxygen saturation 91% on room air. Cardiovascular regular rate and rhythm. Respiratory fair air movement, no frank wheezes, rales or rhonchi. Able to speak in full sentences. No acute distress. No lower extremity edema. Eyes, ENT appears grossly unremarkable. Skin appears unremarkable.  CBC, complete metabolic panel unremarkable. Influenza screen was negative. Chest x-ray without evidence of pneumonia. Influenza A and B-.  A/P: 58 year old woman with fever, shortness of breath and cough most consistent with COPD exacerbation with acute bronchitis complicated by ongoing cigarette smoking. Plan treatment with supplemental oxygen as needed, steroids, bronchodilators, antibiotics. I recommend smoking cessation but she is undecided.   Murray Hodgkins, MD [facility] [phone]

## 2013-10-13 DIAGNOSIS — J9600 Acute respiratory failure, unspecified whether with hypoxia or hypercapnia: Secondary | ICD-10-CM

## 2013-10-13 LAB — BASIC METABOLIC PANEL
BUN: 24 mg/dL — AB (ref 6–23)
CALCIUM: 9.2 mg/dL (ref 8.4–10.5)
CO2: 24 meq/L (ref 19–32)
Chloride: 96 mEq/L (ref 96–112)
Creatinine, Ser: 1.05 mg/dL (ref 0.50–1.10)
GFR calc Af Amer: 67 mL/min — ABNORMAL LOW (ref >90)
GFR calc non Af Amer: 58 mL/min — ABNORMAL LOW (ref >90)
GLUCOSE: 242 mg/dL — AB (ref 70–99)
Potassium: 4.4 mEq/L (ref 3.7–5.3)
SODIUM: 138 meq/L (ref 137–147)

## 2013-10-13 LAB — CBC
HCT: 42.4 % (ref 36.0–46.0)
Hemoglobin: 13.4 g/dL (ref 12.0–15.0)
MCH: 27.7 pg (ref 26.0–34.0)
MCHC: 31.6 g/dL (ref 30.0–36.0)
MCV: 87.6 fL (ref 78.0–100.0)
Platelets: 239 10*3/uL (ref 150–400)
RBC: 4.84 MIL/uL (ref 3.87–5.11)
RDW: 15.7 % — ABNORMAL HIGH (ref 11.5–15.5)
WBC: 6.9 10*3/uL (ref 4.0–10.5)

## 2013-10-13 LAB — GLUCOSE, CAPILLARY
GLUCOSE-CAPILLARY: 235 mg/dL — AB (ref 70–99)
Glucose-Capillary: 306 mg/dL — ABNORMAL HIGH (ref 70–99)
Glucose-Capillary: 111 mg/dL — ABNORMAL HIGH (ref 70–99)
Glucose-Capillary: 199 mg/dL — ABNORMAL HIGH (ref 70–99)

## 2013-10-13 LAB — TSH: TSH: 2.068 u[IU]/mL (ref 0.350–4.500)

## 2013-10-13 MED ORDER — INSULIN ASPART 100 UNIT/ML ~~LOC~~ SOLN
3.0000 [IU] | Freq: Three times a day (TID) | SUBCUTANEOUS | Status: DC
  Administered 2013-10-13 – 2013-10-14 (×3): 3 [IU] via SUBCUTANEOUS

## 2013-10-13 MED ORDER — INSULIN ASPART 100 UNIT/ML ~~LOC~~ SOLN
0.0000 [IU] | Freq: Three times a day (TID) | SUBCUTANEOUS | Status: DC
  Administered 2013-10-13: 15 [IU] via SUBCUTANEOUS
  Administered 2013-10-14: 4 [IU] via SUBCUTANEOUS

## 2013-10-13 MED ORDER — LEVOFLOXACIN 750 MG PO TABS
750.0000 mg | ORAL_TABLET | Freq: Every day | ORAL | Status: DC
  Administered 2013-10-14: 750 mg via ORAL
  Filled 2013-10-13: qty 1

## 2013-10-13 MED ORDER — METHYLPREDNISOLONE SODIUM SUCC 125 MG IJ SOLR
60.0000 mg | Freq: Two times a day (BID) | INTRAMUSCULAR | Status: DC
  Administered 2013-10-13 – 2013-10-14 (×2): 60 mg via INTRAVENOUS
  Filled 2013-10-13 (×2): qty 2

## 2013-10-13 MED ORDER — LEVOFLOXACIN 750 MG PO TABS: 750.0000 mg | ORAL_TABLET | Freq: Every day | ORAL | Status: DC

## 2013-10-13 NOTE — Progress Notes (Signed)
Utilization Review Complete  

## 2013-10-13 NOTE — Progress Notes (Signed)
Patient seen, independently examined and chart reviewed. I agree with exam, assessment and plan discussed with Dyanne Carrel, NP.  Overall feeling better. Breathing better.  Afebrile, vital signs stable. Hypoxia appears stable and 2-liters. Appears calm and comfortable. Sitting up in the bed. Air movement is improved. No frank wheezes, rales or rhonchi. Decreased respiratory effort. Cardiovascular regular rate and rhythm, no murmur rub or gallop.  Basic metabolic panel unremarkable. CBC unremarkable. Blood sugars 200 status 300  Overall she appears to be improving. We will continue treatment for COPD exacerbation/acute bronchitis. She will attempt to quit smoking she promises today. Wean oxygen as tolerated.  Murray Hodgkins, MD [facility] [phone]

## 2013-10-13 NOTE — Progress Notes (Signed)
[facility] PROGRESS NOTE  Lisa Ewing [id number] DOB: Jan 25, 1956 DOA: 10/12/2013 PCP: Odette Fraction, MD  Assessment/Plan: Principal Problem:  Acute respiratory failure: Likely related to COPD exacerbation in setting of bronchitis. Much improved.  Will continue oxygen support. continue Xopenex nebulizer treatments and continue Solu-Medrol but will taper and anticipate transitioning to po tomorrow.   Active Problems:  COPD exacerbation: Likely related to bronchitis flare. Influenza panel PCR negative. Will continue nebulizers and Solu-Medrol as noted above. Will continue Levaquin but change to po.    Tachycardia: Likely related to nebulizer treatments she received in the emergency department. Resolved. Chest x-ray without infiltrate. No leukocytosis. TSH within normal limits.    Acute bronchitis: Levaquin. See above therapies. She remains afebrile with a normal white count.   Diabetes mellitus without complication: Hemoglobin A1c 7.0. She is on metformin at home we will hold this for now. Will use sliding scale insulin for optimal glycemic control. Will start meal coverage. CBG's elevated likely related to steroids. Very good appetite as well.  Will provide a carb modified diet   GERD: Appears stable at baseline. Continue her home PPI   Chronic low back pain: She is wheelchair-bound since 2009 from an fall. Continue her home pain medications. Appears stable at baseline  Gait disorder: Wheelchair-bound since 2009. Is able to bear weight on the left leg to transition from bed to chair. Appears stable at baseline   COPD (chronic obstructive pulmonary disease): See #1. Patient reports not having a pulmonologist or ever having pulmonary function tests. Would likely benefit once over this acute illness   Depression: Appears stable at baseline.   Hyperlipidemia: . Will continue her home medications  Hypothyroid: TSH 2.06. Continue her home meds   Smoker: She has been counseled  regarding smoking cessation.  Code Status: full Family Communication: caregiver Disposition Plan: home hopefully tomorrow   Consultants:  none  Procedures:  none  Antibiotics:  Levaquin 10/12/13>>  HPI/Subjective: Lying in bed eating. Reports feeling much better. Denies pain/discomfort. Occasional coughing  Objective: Filed Vitals:   10/13/13 0548  BP: 134/86  Pulse: 93  Temp: 98.1 F (36.7 C)  Resp: 20    Intake/Output Summary (Last 24 hours) at 10/13/13 1102 Last data filed at 10/13/13 0000  Gross per 24 hour  Intake 739.17 ml  Output      0 ml  Net 739.17 ml   Filed Weights   10/12/13 1131 10/12/13 1643 10/13/13 0548  Weight: 111.131 kg (245 lb) 111.14 kg (245 lb 0.3 oz) 116 kg (255 lb 11.7 oz)    Exam:   General:  Obese NAD  Cardiovascular: RRR No MGR No LE edema  Respiratory: normal effort. Improved air flow. No wheeze or rhonchi  Abdomen: obese soft +BS non-tender to palpation  Musculoskeletal: no clubbing no cyanosis   Data Reviewed: Basic Metabolic Panel:  Recent Labs Lab 10/12/13 1328 10/13/13 0613  NA 138 138  K 4.3 4.4  CL 96 96  CO2 28 24  GLUCOSE 189* 242*  BUN 16 24*  CREATININE 0.89 1.05  CALCIUM 9.3 9.2   Liver Function Tests:  Recent Labs Lab 10/12/13 1328  AST 36  ALT 42*  ALKPHOS 61  BILITOT <0.2*  PROT 7.3  ALBUMIN 3.6   No results found for this basename: LIPASE, AMYLASE,  in the last 168 hours No results found for this basename: AMMONIA,  in the last 168 hours CBC:  Recent Labs Lab 10/12/13 1328 10/13/13 0613  WBC 6.3 6.9  NEUTROABS 4.1  --   HGB 14.1 13.4  HCT 43.1 42.4  MCV 86.9 87.6  PLT 228 239   Cardiac Enzymes: No results found for this basename: CKTOTAL, CKMB, CKMBINDEX, TROPONINI,  in the last 168 hours BNP (last 3 results) No results found for this basename: PROBNP,  in the last 8760 hours CBG:  Recent Labs Lab 10/12/13 1639 10/12/13 2107 10/13/13 0755  GLUCAP 237* 286* 235*     Recent Results (from the past 240 hour(s))  INFLUENZA A AND B     Status: None   Collection Time    10/11/13  9:32 AM      Result Value Range Status   Source-INFBD NASAL   Final   Inflenza A Ag NEG  Negative Final   Influenza B Ag NEG  Negative Final     Studies: Dg Chest 1 View  10/12/2013   CLINICAL DATA:  Cough and fever  EXAM: CHEST - 1 VIEW  COMPARISON:  Portable chest x-ray of January 04, 2011  FINDINGS: The lungs are well-expanded. The interstitial markings are nearly normal. They are much improved over the previous study. The cardiac silhouette is normal in size. The pulmonary vascularity is not clearly engorged. The mediastinum is normal in width. There is no pleural effusion.  IMPRESSION: There is no definite evidence of pneumonia. There is minimal prominence of the interstitial markings inferiorly but much improvement has occurred since the previous study. If the patient's symptoms persist, a followup PA and lateral chest x-ray would be of value.   Electronically Signed   By: David  Martinique   On: 10/12/2013 12:07   Ct Head Wo Contrast  10/12/2013   CLINICAL DATA:  Acute onset headache  EXAM: CT HEAD WITHOUT CONTRAST  TECHNIQUE: Contiguous axial images were obtained from the base of the skull through the vertex without intravenous contrast. Study was obtained within 24 hr of patient's arrival at the emergency department.  COMPARISON:  None.  FINDINGS: The ventricles are normal in size and configuration. There is no mass, hemorrhage, extra-axial fluid collection, or midline shift. There is minimal small vessel disease in the centra semiovale bilaterally. Elsewhere gray-white compartments are normal. There is no demonstrable acute infarct.  Bony calvarium appears intact. Mastoid air cells on the left clear. There is opacification of multiple right-sided mastoid air cells. There is mild rightward deviation of the nasal septum.  IMPRESSION: Minimal periventricular small vessel disease.  Right-sided mastoid disease. No intracranial mass, hemorrhage, or acute appearing infarct.   Electronically Signed   By: Lowella Grip M.D.   On: 10/12/2013 13:34    Scheduled Meds: . antiseptic oral rinse  15 mL Mouth Rinse BID  . atorvastatin  40 mg Oral Daily  . azelastine  2 spray Each Nare BID  . darifenacin  7.5 mg Oral Daily  . enoxaparin (LOVENOX) injection  40 mg Subcutaneous Q24H  . escitalopram  20 mg Oral Daily  . fenofibrate  54 mg Oral Daily  . gabapentin  300 mg Oral TID AC   And  . gabapentin  600 mg Oral QHS  . HYDROcodone-acetaminophen  1 tablet Oral TID  . insulin aspart  0-5 Units Subcutaneous QHS  . insulin aspart  0-9 Units Subcutaneous TID WC  . ipratropium  0.5 mg Nebulization Q6H  . levalbuterol  0.63 mg Nebulization Q6H  . levofloxacin (LEVAQUIN) IV  750 mg Intravenous Q24H  . levothyroxine  125 mcg Oral QAC breakfast  . methylPREDNISolone (SOLU-MEDROL) injection  60  mg Intravenous Q12H  . montelukast  10 mg Oral QHS  . pantoprazole  40 mg Oral Daily  . raloxifene  60 mg Oral Daily  . traZODone  100 mg Oral QHS   Continuous Infusions:   Principal Problem:   Acute respiratory failure Active Problems:   GERD   Chronic low back pain   Gait disorder   COPD (chronic obstructive pulmonary disease)   Depression   Diabetes mellitus without complication   GERD (gastroesophageal reflux disease)   Hyperlipidemia   Hypothyroid   Smoker   COPD exacerbation   Tachycardia   Acute bronchitis    Time spent: 35 minutes    [facility] Pager [phone]. If 7PM-7AM, please contact night-coverage at [email or web address], password [facility] 10/13/2013, 11:02 AM  LOS: 1 day

## 2013-10-14 LAB — GLUCOSE, CAPILLARY: GLUCOSE-CAPILLARY: 161 mg/dL — AB (ref 70–99)

## 2013-10-14 MED ORDER — GUAIFENESIN-DM 100-10 MG/5ML PO SYRP: 5.0000 mL | ORAL_SOLUTION | ORAL | Status: DC | PRN

## 2013-10-14 MED ORDER — PREDNISONE 10 MG PO TABS: ORAL_TABLET | ORAL | Status: DC

## 2013-10-14 MED ORDER — PREDNISONE 20 MG PO TABS
60.0000 mg | ORAL_TABLET | Freq: Every day | ORAL | Status: DC
  Administered 2013-10-14: 60 mg via ORAL
  Filled 2013-10-14: qty 3

## 2013-10-14 MED ORDER — LEVOFLOXACIN 750 MG PO TABS: 750.0000 mg | ORAL_TABLET | Freq: Every day | ORAL | Status: AC

## 2013-10-14 NOTE — Discharge Summary (Signed)
Physician Discharge Summary  Lisa Ewing [id number] DOB: 20-Jun-1956 DOA: 10/12/2013  PCP: Odette Fraction, MD  Admit date: 10/12/2013 Discharge date: 10/14/2013  Time spent: 45 minutes  Recommendations for Outpatient Follow-up:  Has appointment with PCP 10/21/13 for follow up respiratory effort/resolutin bronchitis. May consider re-assessment of niacin given A1c.   Discharge Diagnoses:  Principal Problem:   Acute respiratory failure Active Problems:   GERD   Chronic low back pain   Gait disorder   COPD (chronic obstructive pulmonary disease)   Depression   Diabetes mellitus without complication   GERD (gastroesophageal reflux disease)   Hyperlipidemia   Hypothyroid   Smoker   COPD exacerbation   Tachycardia   Acute bronchitis   Discharge Condition: stable  Diet recommendation: carb modified  Filed Weights   10/13/13 0548 10/14/13 0500 10/14/13 0716  Weight: 116 kg (255 lb 11.7 oz) 115 kg (253 lb 8.5 oz) 113.399 kg (250 lb)    History of present illness:  Lisa Ewing is a 58 y.o. female with a past medical history that includes pneumonia, COPD she is not on oxygen at home, asthma, diabetes, GERD, hyperlipidemia, smoker who presented to the emergency department on 10/12/13 with chief complaint of worsening shortness of breath cough and fever. She reported three-day history of worsening cough with yellow-green sputum chills fever as high as 101. Associated symptoms included wheezing and worsening shortness of breath, headache. She uses a nebulizer machine and got a little relief for short period of time. sHe saw primary care provider 10/11/13 and was given a Z-Pak she has had one dose. On 10/12/13 she awakened and felt much worse so she came to the emergency room. She denied chest pain palpitations nausea vomiting diaphoresis. In the emergency department CBC and basic metabolic panel were unremarkable. Influenza PCR negative. CT of the head with right-sided mastoid disease. No  intracranial mass hemorrhage or acute appearing infarct. Chest x-ray with no definite evidence of pneumonia. Minimal prominence of the interstitial markings inferiorly but much improved from previous study. Vital signs significant for a heart rate of 127 and oxygen saturation level of 90% on room air at rest.    Hospital Course:  Acute respiratory failure: Likely related to COPD exacerbation in setting of bronchitis. Admitted to telemetry. Provided with oxygen, nebulizer, solumedrol and Levaquin. She steadily improved. At discharge oxygen saturation level 99% on room air. Will discharge with prednisone taper and 2 more days of Levaquin to complete 5 day course. She has follow up appointment with PCP 10/21/13.    Active Problems:  COPD exacerbation: Likely related to bronchitis flare. Influenza panel PCR negative. See above.    Tachycardia: Likely related to nebulizer treatments she received in the emergency department. Chest x-ray without infiltrate. No leukocytosis. TSH within normal limits. Nebs changed to xopenex and this resolved. At discharge HR range 78-90.  Acute bronchitis: Levaquin provided. See above therapies. She remained afebrile with a normal white count.   Diabetes mellitus without complication: Hemoglobin A1c 7.0. She is on metformin at home. CBG's elevated likely related to steroids. Anticipate this trending down as steroids tapered. CBG range at discharge 161-199. Metformin resumed at discharge.    GERD: Appears stable at baseline. Continue her home PPI   Chronic low back pain: She is wheelchair-bound since 2009 from an fall. Continue her home pain medications. Remained stable at baseline   Gait disorder: Wheelchair-bound since 2009. Is able to bear weight on the left leg to transition from bed to chair. Remained  stable at baseline   COPD (chronic obstructive pulmonary disease): See #1. Patient reports not having a pulmonologist or ever having pulmonary function tests. Would  likely benefit once over this acute illness   Depression: remained  stable at baseline.  Hyperlipidemia: Will continue statin  Hypothyroid: TSH 2.06. Smoker: She has been counseled regarding smoking cessation.   Procedures:  none  Consultations:  none  Discharge Exam: Filed Vitals:   10/14/13 0716  BP: 114/73  Pulse: 78  Temp: 97.4 F (36.3 C)  Resp: 19    General: obese appears calm comfortable Cardiovascular: RRR no m/g/r. No LE edema  Respiratory: normal effort BS with improved air flow. No wheeze or crackles  Discharge Instructions  Discharge Orders   Future Appointments Provider Department Dept Phone   10/21/2013 9:15 AM Susy Frizzle, MD [facility] [phone]   11/07/2013 10:30 AM Charlett Blake[facility] [phone]   Future Orders Complete By Expires   Diet - low sodium heart healthy  As directed    Discharge instructions  As directed    Comments:     Take medication as directed Follow up with PCP 1-2 weeks  If symptoms do not improve or worsen seek medical attention   Increase activity slowly  As directed        Medication List    STOP taking these medications       azithromycin 250 MG tablet  Commonly known as:  ZITHROMAX      TAKE these medications       acyclovir 400 MG tablet  Commonly known as:  ZOVIRAX  TAKE ONE TABLET BY MOUTH AT BEDTIME     ADVAIR DISKUS 250-50 MCG/DOSE Aepb  Generic drug:  Fluticasone-Salmeterol  TAKE 1 INHALATION BY MOUTH TWICE DAILY.  RINSE MOUTH AFTER USE.     atorvastatin 40 MG tablet  Commonly known as:  LIPITOR  Take 1 tablet (40 mg total) by mouth daily.     azelastine 137 MCG/SPRAY nasal spray  Commonly known as:  ASTELIN  Place 2 sprays into the nose 2 (two) times daily. Use in each nostril as directed     calcitRIOL 0.25 MCG capsule  Commonly known as:  ROCALTROL  TAKE ONE CAPSULE BY MOUTH ONCE DAILY     cetirizine 10 MG tablet  Commonly known  as:  ZYRTEC  TAKE ONE TABLET BY MOUTH DAILY FOR ALLERGIES     diclofenac sodium 1 % Gel  Commonly known as:  VOLTAREN  Apply 2 g topically 4 (four) times daily.     dicyclomine 10 MG capsule  Commonly known as:  BENTYL  Take 1 capsule before meals and at bedtime as needed for loose stools. Hold for constipation.     EPIPEN 2-PAK 0.3 mg/0.3 mL Soaj injection  Generic drug:  EPINEPHrine  INJECT 1 PEN INTRAMUSCULARLY AS NEEDED FOR ALLERGIC REACTION     escitalopram 20 MG tablet  Commonly known as:  LEXAPRO  TAKE ONE TABLET BY MOUTH DAILY     EVISTA 60 MG tablet  Generic drug:  raloxifene  TAKE ONE TABLET BY MOUTH DAILY     furosemide 20 MG tablet  Commonly known as:  LASIX  TAKE ONE TABLET BY MOUTH EACH MORNING     gabapentin 300 MG capsule  Commonly known as:  NEURONTIN  TAKE 1 CAPSULE BY MOUTH THREE TIMES A DAY AND 2 CAPSULES AT BEDTIME     guaiFENesin-dextromethorphan 100-10 MG/5ML syrup  Commonly  known as:  ROBITUSSIN DM  Take 5 mLs by mouth every 4 (four) hours as needed for cough.     HYDROcodone-acetaminophen 10-325 MG per tablet  Commonly known as:  NORCO  Take 1 tablet by mouth 3 (three) times daily.     ibandronate 150 MG tablet  Commonly known as:  BONIVA  TAKE 1 TABLET EVERY MONTH     ipratropium-albuterol 0.5-2.5 (3) MG/3ML Soln  Commonly known as:  DUONEB  INHALE THE CONTENTS OF ONE VIAL VIA NEBULIZER BY MOUTH FOUR TIMES A DAY AS NEEDED FOR WHEEZING OR SHORTNESS OF BREATH     levofloxacin 750 MG tablet  Commonly known as:  LEVAQUIN  Take 1 tablet (750 mg total) by mouth daily.     levothyroxine 125 MCG tablet  Commonly known as:  SYNTHROID, LEVOTHROID  Take 1 tablet (125 mcg total) by mouth daily before breakfast.     montelukast 10 MG tablet  Commonly known as:  SINGULAIR  TAKE ONE TABLET BY MOUTH AT BEDTIME     NEXIUM 40 MG capsule  Generic drug:  esomeprazole  TAKE ONE CAPSULE BY MOUTH TWICE DAILY     NIASPAN 500 MG CR tablet  Generic  drug:  niacin  TAKE THREE TABLETS BY MOUTH AT BEDTIME     pantoprazole 40 MG tablet  Commonly known as:  PROTONIX  Take 1 tablet (40 mg total) by mouth daily.     predniSONE 10 MG tablet  Commonly known as:  DELTASONE  Take 4 tabs for 3 days then take 2 tabs for 3 days then take 1 tab for 3 days then stop.     PROVENTIL HFA 108 (90 BASE) MCG/ACT inhaler  Generic drug:  albuterol  USE 2 PUFFS EVERY 4 TO 6 HOURS AS NEEDED FOR SHORTNESS OF BREATH     traZODone 100 MG tablet  Commonly known as:  DESYREL  Take 1 tablet (100 mg total) by mouth at bedtime.     triamcinolone cream 0.1 %  Commonly known as:  KENALOG  Apply topically 2 (two) times daily.     triamcinolone cream 0.1 %  Commonly known as:  KENALOG  Apply 1 application topically 2 (two) times daily.     TRILIPIX 135 MG capsule  Generic drug:  Choline Fenofibrate  TAKE ONE CAPSULE BY MOUTH ONCE DAILY     VESICARE 5 MG tablet  Generic drug:  solifenacin  TAKE 1 TABLET BY MOUTH EVERY DAY       Allergies  Allergen Reactions  . Bee Venom Anaphylaxis  . Latex Shortness Of Breath  . Oxycodone-Acetaminophen Hives and Shortness Of Breath    Upset Stomach  . Penicillins Anaphylaxis    Throat swells   . Shellfish Allergy Anaphylaxis    Throat swells   . Codeine Nausea Only  . Metoclopramide Hcl     Doesn't recall type of reaction  . Other Swelling    Almonds. Bananas cause an asthma attack.   . Pregabalin Swelling  . Sulfonamide Derivatives     Tongue swells   . Adhesive [Tape] Rash  . Wellbutrin [Bupropion] Rash    Hives, red whelps       Follow-up Information   Follow up with [facility] TOM, MD On 10/21/2013. (appointment at 9:15am)    Specialty:  Family Medicine   Contact information:   [address] [place] [address] [phone]        The results of significant diagnostics from this hospitalization (  including imaging, microbiology, ancillary and laboratory) are listed below for  reference.    Significant Diagnostic Studies: Dg Chest 1 View  10/12/2013   CLINICAL DATA:  Cough and fever  EXAM: CHEST - 1 VIEW  COMPARISON:  Portable chest x-ray of January 04, 2011  FINDINGS: The lungs are well-expanded. The interstitial markings are nearly normal. They are much improved over the previous study. The cardiac silhouette is normal in size. The pulmonary vascularity is not clearly engorged. The mediastinum is normal in width. There is no pleural effusion.  IMPRESSION: There is no definite evidence of pneumonia. There is minimal prominence of the interstitial markings inferiorly but much improvement has occurred since the previous study. If the patient's symptoms persist, a followup PA and lateral chest x-ray would be of value.   Electronically Signed   By: David  Martinique   On: 10/12/2013 12:07   Ct Head Wo Contrast  10/12/2013   CLINICAL DATA:  Acute onset headache  EXAM: CT HEAD WITHOUT CONTRAST  TECHNIQUE: Contiguous axial images were obtained from the base of the skull through the vertex without intravenous contrast. Study was obtained within 24 hr of patient's arrival at the emergency department.  COMPARISON:  None.  FINDINGS: The ventricles are normal in size and configuration. There is no mass, hemorrhage, extra-axial fluid collection, or midline shift. There is minimal small vessel disease in the centra semiovale bilaterally. Elsewhere gray-white compartments are normal. There is no demonstrable acute infarct.  Bony calvarium appears intact. Mastoid air cells on the left clear. There is opacification of multiple right-sided mastoid air cells. There is mild rightward deviation of the nasal septum.  IMPRESSION: Minimal periventricular small vessel disease. Right-sided mastoid disease. No intracranial mass, hemorrhage, or acute appearing infarct.   Electronically Signed   By: Lowella Grip M.D.   On: 10/12/2013 13:34    Microbiology: Recent Results (from the past 240 hour(s))   INFLUENZA A AND B     Status: None   Collection Time    10/11/13  9:32 AM      Result Value Range Status   Source-INFBD NASAL   Final   Inflenza A Ag NEG  Negative Final   Influenza B Ag NEG  Negative Final     Labs: Basic Metabolic Panel:  Recent Labs Lab 10/12/13 1328 10/13/13 0613  NA 138 138  K 4.3 4.4  CL 96 96  CO2 28 24  GLUCOSE 189* 242*  BUN 16 24*  CREATININE 0.89 1.05  CALCIUM 9.3 9.2   Liver Function Tests:  Recent Labs Lab 10/12/13 1328  AST 36  ALT 42*  ALKPHOS 61  BILITOT <0.2*  PROT 7.3  ALBUMIN 3.6   No results found for this basename: LIPASE, AMYLASE,  in the last 168 hours No results found for this basename: AMMONIA,  in the last 168 hours CBC:  Recent Labs Lab 10/12/13 1328 10/13/13 0613  WBC 6.3 6.9  NEUTROABS 4.1  --   HGB 14.1 13.4  HCT 43.1 42.4  MCV 86.9 87.6  PLT 228 239   Cardiac Enzymes: No results found for this basename: CKTOTAL, CKMB, CKMBINDEX, TROPONINI,  in the last 168 hours BNP: BNP (last 3 results) No results found for this basename: PROBNP,  in the last 8760 hours CBG:  Recent Labs Lab 10/13/13 0755 10/13/13 1200 10/13/13 1714 10/13/13 2109 10/14/13 0735  GLUCAP 235* 306* 111* 199* 161*       Signed:  BLACK,KAREN M  [facility] 10/14/2013, 10:02  AM     

## 2013-10-14 NOTE — Discharge Summary (Signed)
Patient seen, independently examined and chart reviewed. I agree with exam, assessment and plan discussed with Dyanne Carrel, NP.  No issues overnight. She is feeling better and breathing better. She would like to go home.  Afebrile, vital signs stable. No hypoxia. She appears better today. Lungs are clear with good air movement. No rhonchi or rales, no wheezes. Normal respiratory effort.  Capillary blood sugars stable.  Overall she is much improved, we will plan for discharge today. She will complete treatment for COPD exacerbation with steroids and should continue her chronic medications. She has decided she is going to try to quit and we discussed various strategies including nicotine gum.  Murray Hodgkins, MD [facility] [phone]

## 2013-10-21 ENCOUNTER — Ambulatory Visit (INDEPENDENT_AMBULATORY_CARE_PROVIDER_SITE_OTHER): Payer: Medicaid Other | Admitting: Family Medicine

## 2013-10-21 ENCOUNTER — Encounter: Payer: Self-pay | Admitting: Family Medicine

## 2013-10-21 VITALS — BP 110/78 | HR 100 | Temp 97.1°F | Resp 24 | Ht 65.0 in | Wt 245.0 lb

## 2013-10-21 DIAGNOSIS — J441 Chronic obstructive pulmonary disease with (acute) exacerbation: Secondary | ICD-10-CM

## 2013-10-21 DIAGNOSIS — Z09 Encounter for follow-up examination after completed treatment for conditions other than malignant neoplasm: Secondary | ICD-10-CM

## 2013-10-21 MED ORDER — PREDNISONE 20 MG PO TABS: 40.0000 mg | ORAL_TABLET | Freq: Every day | ORAL | Status: DC

## 2013-10-21 MED ORDER — LEVOFLOXACIN 500 MG PO TABS: 500.0000 mg | ORAL_TABLET | Freq: Every day | ORAL | Status: DC
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
## 2013-11-01 ENCOUNTER — Telehealth: Payer: Self-pay | Admitting: *Deleted

## 2013-11-01 NOTE — Telephone Encounter (Signed)
Received call from [place] with [employer].  Requested information on required chart notes for powered wheelchair.   Call back number [phone]

## 2013-11-02 NOTE — Telephone Encounter (Signed)
Called and answered questions

## 2013-11-07 ENCOUNTER — Ambulatory Visit (HOSPITAL_BASED_OUTPATIENT_CLINIC_OR_DEPARTMENT_OTHER): Payer: Medicaid Other | Admitting: Physical Medicine & Rehabilitation

## 2013-11-07 ENCOUNTER — Encounter: Payer: Self-pay | Admitting: Physical Medicine & Rehabilitation

## 2013-11-07 ENCOUNTER — Encounter: Payer: Medicaid Other | Attending: Physical Medicine and Rehabilitation

## 2013-11-07 VITALS — BP 158/81 | HR 109 | Resp 14 | Ht 65.0 in | Wt 245.0 lb

## 2013-11-07 DIAGNOSIS — M75100 Unspecified rotator cuff tear or rupture of unspecified shoulder, not specified as traumatic: Secondary | ICD-10-CM

## 2013-11-07 DIAGNOSIS — M7062 Trochanteric bursitis, left hip: Secondary | ICD-10-CM

## 2013-11-07 DIAGNOSIS — M47817 Spondylosis without myelopathy or radiculopathy, lumbosacral region: Secondary | ICD-10-CM | POA: Insufficient documentation

## 2013-11-07 DIAGNOSIS — M545 Low back pain, unspecified: Secondary | ICD-10-CM

## 2013-11-07 DIAGNOSIS — M76899 Other specified enthesopathies of unspecified lower limb, excluding foot: Secondary | ICD-10-CM

## 2013-11-07 DIAGNOSIS — IMO0002 Reserved for concepts with insufficient information to code with codable children: Secondary | ICD-10-CM

## 2013-11-07 DIAGNOSIS — M25519 Pain in unspecified shoulder: Secondary | ICD-10-CM | POA: Insufficient documentation

## 2013-11-07 DIAGNOSIS — M79609 Pain in unspecified limb: Secondary | ICD-10-CM | POA: Insufficient documentation

## 2013-11-07 DIAGNOSIS — R29898 Other symptoms and signs involving the musculoskeletal system: Secondary | ICD-10-CM | POA: Insufficient documentation

## 2013-11-07 DIAGNOSIS — R269 Unspecified abnormalities of gait and mobility: Secondary | ICD-10-CM | POA: Insufficient documentation

## 2013-11-07 DIAGNOSIS — G8929 Other chronic pain: Secondary | ICD-10-CM

## 2013-11-07 DIAGNOSIS — M7542 Impingement syndrome of left shoulder: Secondary | ICD-10-CM

## 2013-11-07 MED ORDER — HYDROCODONE-ACETAMINOPHEN 10-325 MG PO TABS: 1.0000 | ORAL_TABLET | Freq: Three times a day (TID) | ORAL | Status: DC

## 2013-11-07 NOTE — Patient Instructions (Signed)
Next visit for ultrasound guided left hip bursa injection
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
## 2013-11-25 ENCOUNTER — Ambulatory Visit (INDEPENDENT_AMBULATORY_CARE_PROVIDER_SITE_OTHER): Payer: Medicaid Other | Admitting: Family Medicine

## 2013-11-25 ENCOUNTER — Encounter: Payer: Self-pay | Admitting: Family Medicine

## 2013-11-25 VITALS — BP 130/68 | HR 100 | Temp 97.4°F | Resp 22 | Ht 65.0 in | Wt 250.0 lb

## 2013-11-25 DIAGNOSIS — J309 Allergic rhinitis, unspecified: Secondary | ICD-10-CM

## 2013-11-25 DIAGNOSIS — J302 Other seasonal allergic rhinitis: Secondary | ICD-10-CM

## 2013-11-25 DIAGNOSIS — J42 Unspecified chronic bronchitis: Secondary | ICD-10-CM

## 2013-11-25 DIAGNOSIS — J45909 Unspecified asthma, uncomplicated: Secondary | ICD-10-CM

## 2013-11-25 MED ORDER — PREDNISONE 20 MG PO TABS: ORAL_TABLET | ORAL | Status: DC

## 2013-11-25 MED ORDER — FLUTICASONE PROPIONATE 50 MCG/ACT NA SUSP: 2.0000 | Freq: Every day | NASAL | Status: DC
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
## 2013-11-27 ENCOUNTER — Other Ambulatory Visit: Payer: Self-pay | Admitting: *Deleted

## 2013-11-27 MED ORDER — HYDROCODONE-ACETAMINOPHEN 10-325 MG PO TABS: 1.0000 | ORAL_TABLET | Freq: Three times a day (TID) | ORAL | Status: DC

## 2013-11-27 NOTE — Telephone Encounter (Signed)
RX printed for MD to sign for RN visit 12/02/13

## 2013-12-02 ENCOUNTER — Encounter: Payer: Self-pay | Admitting: *Deleted

## 2013-12-02 ENCOUNTER — Encounter: Payer: Medicaid Other | Attending: Physical Medicine & Rehabilitation | Admitting: *Deleted

## 2013-12-02 VITALS — BP 139/86 | HR 101 | Resp 14

## 2013-12-02 DIAGNOSIS — M545 Low back pain, unspecified: Secondary | ICD-10-CM | POA: Insufficient documentation

## 2013-12-02 DIAGNOSIS — M79609 Pain in unspecified limb: Secondary | ICD-10-CM | POA: Insufficient documentation

## 2013-12-02 DIAGNOSIS — Z79899 Other long term (current) drug therapy: Secondary | ICD-10-CM | POA: Insufficient documentation

## 2013-12-02 DIAGNOSIS — M25519 Pain in unspecified shoulder: Secondary | ICD-10-CM | POA: Insufficient documentation

## 2013-12-02 DIAGNOSIS — M546 Pain in thoracic spine: Secondary | ICD-10-CM | POA: Insufficient documentation

## 2013-12-02 DIAGNOSIS — R269 Unspecified abnormalities of gait and mobility: Secondary | ICD-10-CM | POA: Insufficient documentation

## 2013-12-02 DIAGNOSIS — M542 Cervicalgia: Secondary | ICD-10-CM | POA: Insufficient documentation

## 2013-12-02 DIAGNOSIS — G8929 Other chronic pain: Secondary | ICD-10-CM | POA: Insufficient documentation

## 2013-12-02 NOTE — Patient Instructions (Signed)
Follow up with Dr Letta Pate for injection 12/15/13

## 2013-12-02 NOTE — Progress Notes (Signed)
Here for pill count and medication refills. Hydrocodone 10/325 # 75 Fill date 11/09/13   Today NV# 22  Pill count appropriate and refill was given. She has had no falls and has been educated and fall prevention handout was given at previous visit.  She does have an Advanced directive but no copy in chart. This has been requested.  Return to see Dr Letta Pate for an injection 12/15/13.

## 2013-12-15 ENCOUNTER — Encounter: Payer: Self-pay | Admitting: Physical Medicine & Rehabilitation

## 2013-12-15 ENCOUNTER — Ambulatory Visit (HOSPITAL_BASED_OUTPATIENT_CLINIC_OR_DEPARTMENT_OTHER): Payer: Medicaid Other | Admitting: Physical Medicine & Rehabilitation

## 2013-12-15 ENCOUNTER — Encounter: Payer: Medicaid Other | Attending: Physical Medicine and Rehabilitation

## 2013-12-15 VITALS — BP 153/90 | HR 100 | Resp 14 | Ht 65.0 in | Wt 250.0 lb

## 2013-12-15 DIAGNOSIS — M76899 Other specified enthesopathies of unspecified lower limb, excluding foot: Secondary | ICD-10-CM | POA: Insufficient documentation

## 2013-12-15 DIAGNOSIS — R29898 Other symptoms and signs involving the musculoskeletal system: Secondary | ICD-10-CM | POA: Insufficient documentation

## 2013-12-15 DIAGNOSIS — M25519 Pain in unspecified shoulder: Secondary | ICD-10-CM | POA: Insufficient documentation

## 2013-12-15 DIAGNOSIS — M7062 Trochanteric bursitis, left hip: Secondary | ICD-10-CM | POA: Insufficient documentation

## 2013-12-15 DIAGNOSIS — M79609 Pain in unspecified limb: Secondary | ICD-10-CM | POA: Insufficient documentation

## 2013-12-15 DIAGNOSIS — R269 Unspecified abnormalities of gait and mobility: Secondary | ICD-10-CM | POA: Insufficient documentation

## 2013-12-15 DIAGNOSIS — M47817 Spondylosis without myelopathy or radiculopathy, lumbosacral region: Secondary | ICD-10-CM | POA: Insufficient documentation

## 2013-12-15 MED ORDER — HYDROCODONE-ACETAMINOPHEN 10-325 MG PO TABS: 1.0000 | ORAL_TABLET | Freq: Three times a day (TID) | ORAL | Status: DC

## 2013-12-15 NOTE — Progress Notes (Signed)
LEFT Trochanteric bursa injection With  ultrasound guidance  Indication Trochanteric bursitis. Exam has tenderness over the greater trochanter of the hip. Pain has not responded to conservative care such as exercise therapy and oral medications. Pain interferes with sleep or with mobility, previous good relief with ultrasound guided trochanteric bursa injection. Patient is morbidly obese Informed consent was obtained after describing risks and benefits of the procedure with the patient these include bleeding bruising and infection. Patient has signed written consent form. Patient placed in a lateral decubitus position with the affected hip superior. Point of maximal pain was palpated marked and prepped with Betadine and entered with a needle to bone contact. Needle slightly withdrawn then 6mg of betamethasone with 4 cc 1% lidocaine were injected. Patient tolerated procedure well. Post procedure instructions given.  

## 2013-12-15 NOTE — Patient Instructions (Signed)

## 2014-01-06 ENCOUNTER — Other Ambulatory Visit: Payer: Self-pay | Admitting: Physical Medicine & Rehabilitation

## 2014-01-13 ENCOUNTER — Encounter: Payer: Self-pay | Admitting: Family Medicine

## 2014-01-13 ENCOUNTER — Telehealth: Payer: Self-pay | Admitting: Family Medicine

## 2014-01-13 DIAGNOSIS — K219 Gastro-esophageal reflux disease without esophagitis: Secondary | ICD-10-CM

## 2014-01-13 MED ORDER — SUCRALFATE 1 G PO TABS: 1.0000 g | ORAL_TABLET | Freq: Three times a day (TID) | ORAL | Status: DC

## 2014-01-13 NOTE — Telephone Encounter (Signed)
Taking protonix daily.  It is not helping her GERD at all.  Has started OTC Nexium as well (takes at night) and still with bad GERD.   Also needs note for dentist stating OK for her to have teeth cleaned. Please advise

## 2014-01-13 NOTE — Telephone Encounter (Signed)
Try carafate 1 g poqachs for gerd and recheck herein 3 weeks.  Okay for teeth to be cleaned.

## 2014-01-13 NOTE — Telephone Encounter (Signed)
Rx to pharmacy and pt made aware. 

## 2014-02-02 ENCOUNTER — Ambulatory Visit (HOSPITAL_BASED_OUTPATIENT_CLINIC_OR_DEPARTMENT_OTHER): Payer: Medicaid Other | Admitting: Physical Medicine & Rehabilitation

## 2014-02-02 ENCOUNTER — Encounter: Payer: Medicaid Other | Attending: Physical Medicine and Rehabilitation

## 2014-02-02 ENCOUNTER — Encounter: Payer: Self-pay | Admitting: Physical Medicine & Rehabilitation

## 2014-02-02 VITALS — BP 146/87 | HR 103 | Resp 14 | Ht 65.0 in | Wt 245.0 lb

## 2014-02-02 DIAGNOSIS — M47817 Spondylosis without myelopathy or radiculopathy, lumbosacral region: Secondary | ICD-10-CM | POA: Insufficient documentation

## 2014-02-02 DIAGNOSIS — M12819 Other specific arthropathies, not elsewhere classified, unspecified shoulder: Secondary | ICD-10-CM

## 2014-02-02 DIAGNOSIS — M25519 Pain in unspecified shoulder: Secondary | ICD-10-CM

## 2014-02-02 DIAGNOSIS — R29898 Other symptoms and signs involving the musculoskeletal system: Secondary | ICD-10-CM | POA: Insufficient documentation

## 2014-02-02 DIAGNOSIS — M79609 Pain in unspecified limb: Secondary | ICD-10-CM | POA: Insufficient documentation

## 2014-02-02 DIAGNOSIS — M19019 Primary osteoarthritis, unspecified shoulder: Secondary | ICD-10-CM

## 2014-02-02 DIAGNOSIS — Z5181 Encounter for therapeutic drug level monitoring: Secondary | ICD-10-CM

## 2014-02-02 DIAGNOSIS — Z79899 Other long term (current) drug therapy: Secondary | ICD-10-CM

## 2014-02-02 DIAGNOSIS — R269 Unspecified abnormalities of gait and mobility: Secondary | ICD-10-CM | POA: Insufficient documentation

## 2014-02-02 DIAGNOSIS — M76899 Other specified enthesopathies of unspecified lower limb, excluding foot: Secondary | ICD-10-CM | POA: Insufficient documentation

## 2014-02-02 MED ORDER — HYDROCODONE-ACETAMINOPHEN 10-325 MG PO TABS: 1.0000 | ORAL_TABLET | Freq: Three times a day (TID) | ORAL | Status: DC

## 2014-02-02 NOTE — Progress Notes (Signed)
Left shoulder complete diagnostic ultrasound  Indication of left shoulder pain and limited range of motion, pain has not responded to conservative care including exercise oral medications and corticosteroid injections  Patient placed in the seated position in her wheelchair. Patient with limited range of motion therefore limiting certain views during the examination.  Left bicipital groove short axis 3.7 mm cross section Left bicipital groove long axis 3.2 mm with no significant peri tendinous fluid Pain with sonopalpation  Left subscapularis tendon long axis 0.54 mm no evidence of tear Left subscapularis tendon short axis 3.7 mm no evidence of tear, cortical irregularity noted  Left supraspinatus tendon long axis view limited by poor shoulder abduction and extension, 5.8 mm no evidence of tear in the visualized portions Left supraspinatus tendon short axis view 2.3 mm , no evidence of tear, cortical irregularity  Left infraspinatus tendon long axis view 4.1 mm no evidence of tear Left infraspinatus tendon short axis view 4.3 mm no evidence of tear Pain with sonopalpation  Left a.c. joint no evidence of capsular disruption joint space appears normal. Pain with sonopalpation  Impression: 1. Abnormal but limited study 2. Evidence of cortical irregularity on articular surfaces on the humerus #3. No evidence of rotator cuff tear #4. No evidence of bicipital tendon tear or tenosynovitis

## 2014-02-02 NOTE — Patient Instructions (Signed)
Will do ultrasound guided injection next visit, will discuss the ultrasound results next visit as well

## 2014-02-03 ENCOUNTER — Other Ambulatory Visit: Payer: Self-pay | Admitting: Family Medicine

## 2014-02-03 NOTE — Telephone Encounter (Signed)
Refill appropriate and filled per protocol. 

## 2014-02-07 ENCOUNTER — Other Ambulatory Visit: Payer: Self-pay | Admitting: Family Medicine

## 2014-02-07 DIAGNOSIS — Z139 Encounter for screening, unspecified: Secondary | ICD-10-CM

## 2014-02-10 ENCOUNTER — Other Ambulatory Visit: Payer: Self-pay | Admitting: Physical Medicine & Rehabilitation

## 2014-02-10 ENCOUNTER — Encounter: Payer: Self-pay | Admitting: Family Medicine

## 2014-02-10 ENCOUNTER — Ambulatory Visit (INDEPENDENT_AMBULATORY_CARE_PROVIDER_SITE_OTHER): Payer: Medicaid Other | Admitting: Family Medicine

## 2014-02-10 ENCOUNTER — Telehealth: Payer: Self-pay

## 2014-02-10 VITALS — BP 140/82 | HR 100 | Temp 98.4°F | Resp 20 | Ht 65.0 in | Wt 245.0 lb

## 2014-02-10 DIAGNOSIS — H9190 Unspecified hearing loss, unspecified ear: Secondary | ICD-10-CM
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
## 2014-02-10 NOTE — Telephone Encounter (Signed)
Patient's UDS from 02/02/14 is consistent.

## 2014-02-14 ENCOUNTER — Ambulatory Visit (HOSPITAL_COMMUNITY): Payer: Medicaid Other

## 2014-02-16 ENCOUNTER — Ambulatory Visit (HOSPITAL_COMMUNITY): Payer: Medicaid Other

## 2014-02-21 ENCOUNTER — Ambulatory Visit (HOSPITAL_COMMUNITY)
Admission: RE | Admit: 2014-02-21 | Discharge: 2014-02-21 | Disposition: A | Discharge status: Home / Self Care | Payer: Medicaid Other | Source: Ambulatory Visit | Attending: Family Medicine | Admitting: Family Medicine

## 2014-02-21 DIAGNOSIS — Z139 Encounter for screening, unspecified: Secondary | ICD-10-CM

## 2014-02-21 DIAGNOSIS — Z1231 Encounter for screening mammogram for malignant neoplasm of breast: Secondary | ICD-10-CM | POA: Insufficient documentation

## 2014-02-28 ENCOUNTER — Other Ambulatory Visit (HOSPITAL_COMMUNITY): Payer: Self-pay | Admitting: Family Medicine

## 2014-02-28 NOTE — Telephone Encounter (Signed)
Medication refilled per protocol. 

## 2014-03-03 ENCOUNTER — Other Ambulatory Visit (HOSPITAL_COMMUNITY): Payer: Self-pay | Admitting: Family Medicine

## 2014-03-03 ENCOUNTER — Other Ambulatory Visit: Payer: Self-pay | Admitting: Physical Medicine & Rehabilitation

## 2014-03-06 ENCOUNTER — Encounter: Payer: Self-pay | Admitting: Registered Nurse

## 2014-03-06 ENCOUNTER — Encounter: Payer: Medicaid Other | Attending: Physical Medicine and Rehabilitation | Admitting: Registered Nurse

## 2014-03-06 VITALS — BP 150/81 | HR 105 | Resp 14

## 2014-03-06 DIAGNOSIS — M7062 Trochanteric bursitis, left hip: Secondary | ICD-10-CM

## 2014-03-06 DIAGNOSIS — M25519 Pain in unspecified shoulder: Secondary | ICD-10-CM | POA: Insufficient documentation

## 2014-03-06 DIAGNOSIS — Z5181 Encounter for therapeutic drug level monitoring: Secondary | ICD-10-CM

## 2014-03-06 DIAGNOSIS — IMO0002 Reserved for concepts with insufficient information to code with codable children: Secondary | ICD-10-CM

## 2014-03-06 DIAGNOSIS — M75100 Unspecified rotator cuff tear or rupture of unspecified shoulder, not specified as traumatic: Secondary | ICD-10-CM

## 2014-03-06 DIAGNOSIS — R269 Unspecified abnormalities of gait and mobility: Secondary | ICD-10-CM | POA: Insufficient documentation

## 2014-03-06 DIAGNOSIS — M545 Low back pain, unspecified: Secondary | ICD-10-CM

## 2014-03-06 DIAGNOSIS — M47817 Spondylosis without myelopathy or radiculopathy, lumbosacral region: Secondary | ICD-10-CM | POA: Insufficient documentation

## 2014-03-06 DIAGNOSIS — Z79899 Other long term (current) drug therapy: Secondary | ICD-10-CM

## 2014-03-06 DIAGNOSIS — R29898 Other symptoms and signs involving the musculoskeletal system: Secondary | ICD-10-CM | POA: Insufficient documentation

## 2014-03-06 DIAGNOSIS — G8929 Other chronic pain: Secondary | ICD-10-CM

## 2014-03-06 DIAGNOSIS — M76899 Other specified enthesopathies of unspecified lower limb, excluding foot: Secondary | ICD-10-CM

## 2014-03-06 DIAGNOSIS — M7542 Impingement syndrome of left shoulder: Secondary | ICD-10-CM

## 2014-03-06 DIAGNOSIS — M79609 Pain in unspecified limb: Secondary | ICD-10-CM | POA: Insufficient documentation

## 2014-03-06 MED ORDER — HYDROCODONE-ACETAMINOPHEN 10-325 MG PO TABS: 1.0000 | ORAL_TABLET | Freq: Three times a day (TID) | ORAL | Status: DC
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
## 2014-03-18 ENCOUNTER — Other Ambulatory Visit: Payer: Self-pay | Admitting: *Deleted

## 2014-03-18 DIAGNOSIS — I10 Essential (primary) hypertension: Secondary | ICD-10-CM

## 2014-03-18 DIAGNOSIS — E785 Hyperlipidemia, unspecified: Secondary | ICD-10-CM

## 2014-03-18 DIAGNOSIS — E119 Type 2 diabetes mellitus without complications: Secondary | ICD-10-CM

## 2014-03-18 DIAGNOSIS — E039 Hypothyroidism, unspecified: Secondary | ICD-10-CM

## 2014-03-22 ENCOUNTER — Other Ambulatory Visit: Payer: Medicaid Other

## 2014-03-22 DIAGNOSIS — E785 Hyperlipidemia, unspecified: Secondary | ICD-10-CM

## 2014-03-22 DIAGNOSIS — E039 Hypothyroidism, unspecified: Secondary | ICD-10-CM

## 2014-03-22 DIAGNOSIS — E119 Type 2 diabetes mellitus without complications: Secondary | ICD-10-CM

## 2014-03-22 DIAGNOSIS — I10 Essential (primary) hypertension: Secondary | ICD-10-CM

## 2014-03-22 LAB — LIPID PANEL
CHOLESTEROL: 156 mg/dL (ref 0–200)
HDL: 21 mg/dL — ABNORMAL LOW (ref >39)
Total CHOL/HDL Ratio: 7.4 Ratio
Triglycerides: 1204 mg/dL — ABNORMAL HIGH (ref <150)

## 2014-03-22 LAB — TSH: TSH: 3.97 u[IU]/mL (ref 0.350–4.500)

## 2014-03-22 LAB — HEMOGLOBIN A1C, FINGERSTICK: Hgb A1C (fingerstick): 6.7 % — ABNORMAL HIGH (ref <5.7)

## 2014-03-29 ENCOUNTER — Telehealth: Payer: Self-pay | Admitting: Family Medicine

## 2014-03-29 DIAGNOSIS — E781 Pure hyperglyceridemia: Secondary | ICD-10-CM

## 2014-03-29 NOTE — Telephone Encounter (Signed)
Message copied by Olena Mater on Wed Mar 29, 2014  3:45 PM ------      Message from: Jenna Luo      Created: Tue Mar 28, 2014  4:32 PM       Please repeat FLP to verify. ------

## 2014-03-29 NOTE — Telephone Encounter (Signed)
Pt called and asked to return fro repeat FLP.  Pt agrees, order placed

## 2014-03-30 ENCOUNTER — Ambulatory Visit (INDEPENDENT_AMBULATORY_CARE_PROVIDER_SITE_OTHER): Payer: Medicaid Other | Admitting: Otolaryngology

## 2014-03-30 ENCOUNTER — Telehealth: Payer: Self-pay | Admitting: Family Medicine

## 2014-03-30 NOTE — Telephone Encounter (Signed)
Pt was looking at her medications after our discussion yesterday about her elevated Triglycerides.  Still planning on having FLP repeated on Monday.  Did want you to know that she had only been taking two Niaspans at night and not the three per instructions.  Has started taking the three tabs as ordered.

## 2014-03-30 NOTE — Telephone Encounter (Signed)
Message copied by Olena Mater on Thu Mar 30, 2014 11:56 AM ------      Message from: Lenore Manner      Created: Thu Mar 30, 2014 11:48 AM      Contact: [phone]       Pt had called earlier wanting to speak to you and I tried to page you so i am not sure if you did get to speak to her.  ------

## 2014-03-31 NOTE — Telephone Encounter (Signed)
Still, trigs were off the chart and I don't think 1 niaspan should do that.  I want to confirm the labs first.

## 2014-04-04 ENCOUNTER — Encounter: Payer: Medicaid Other | Attending: Physical Medicine and Rehabilitation

## 2014-04-04 ENCOUNTER — Encounter: Payer: Self-pay | Admitting: Physical Medicine & Rehabilitation

## 2014-04-04 ENCOUNTER — Ambulatory Visit (HOSPITAL_COMMUNITY)
Admission: RE | Admit: 2014-04-04 | Discharge: 2014-04-04 | Disposition: A | Discharge status: Home / Self Care | Payer: Medicaid Other | Source: Ambulatory Visit | Attending: Physical Medicine & Rehabilitation | Admitting: Physical Medicine & Rehabilitation

## 2014-04-04 ENCOUNTER — Ambulatory Visit (HOSPITAL_BASED_OUTPATIENT_CLINIC_OR_DEPARTMENT_OTHER): Payer: Medicaid Other | Admitting: Physical Medicine & Rehabilitation

## 2014-04-04 VITALS — BP 164/86 | HR 118 | Resp 16 | Ht 65.0 in | Wt 250.0 lb

## 2014-04-04 DIAGNOSIS — M25519 Pain in unspecified shoulder: Secondary | ICD-10-CM | POA: Insufficient documentation

## 2014-04-04 DIAGNOSIS — Z9181 History of falling: Secondary | ICD-10-CM | POA: Insufficient documentation

## 2014-04-04 DIAGNOSIS — R29898 Other symptoms and signs involving the musculoskeletal system: Secondary | ICD-10-CM | POA: Insufficient documentation

## 2014-04-04 DIAGNOSIS — M5137 Other intervertebral disc degeneration, lumbosacral region: Secondary | ICD-10-CM | POA: Diagnosis not present

## 2014-04-04 DIAGNOSIS — G8929 Other chronic pain: Secondary | ICD-10-CM

## 2014-04-04 DIAGNOSIS — M5146 Schmorl's nodes, lumbar region: Secondary | ICD-10-CM | POA: Insufficient documentation

## 2014-04-04 DIAGNOSIS — R269 Unspecified abnormalities of gait and mobility: Secondary | ICD-10-CM | POA: Diagnosis not present

## 2014-04-04 DIAGNOSIS — M7540 Impingement syndrome of unspecified shoulder: Secondary | ICD-10-CM

## 2014-04-04 DIAGNOSIS — I700 Atherosclerosis of aorta: Secondary | ICD-10-CM | POA: Diagnosis not present

## 2014-04-04 DIAGNOSIS — M545 Low back pain, unspecified: Secondary | ICD-10-CM | POA: Diagnosis present

## 2014-04-04 DIAGNOSIS — M47817 Spondylosis without myelopathy or radiculopathy, lumbosacral region: Secondary | ICD-10-CM | POA: Diagnosis not present

## 2014-04-04 DIAGNOSIS — M719 Bursopathy, unspecified: Secondary | ICD-10-CM

## 2014-04-04 DIAGNOSIS — M67919 Unspecified disorder of synovium and tendon, unspecified shoulder: Secondary | ICD-10-CM

## 2014-04-04 DIAGNOSIS — M79609 Pain in unspecified limb: Secondary | ICD-10-CM | POA: Diagnosis not present

## 2014-04-04 DIAGNOSIS — M76899 Other specified enthesopathies of unspecified lower limb, excluding foot: Secondary | ICD-10-CM | POA: Insufficient documentation

## 2014-04-04 DIAGNOSIS — M51379 Other intervertebral disc degeneration, lumbosacral region without mention of lumbar back pain or lower extremity pain: Secondary | ICD-10-CM | POA: Insufficient documentation

## 2014-04-04 MED ORDER — HYDROCODONE-ACETAMINOPHEN 10-325 MG PO TABS: 1.0000 | ORAL_TABLET | Freq: Three times a day (TID) | ORAL | Status: DC

## 2014-04-04 NOTE — Patient Instructions (Signed)
May use ibuprofen along with hydrocodone for 2wks for this flare up  Xray across the street at [facility]
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
## 2014-04-05 ENCOUNTER — Other Ambulatory Visit: Payer: Medicaid Other

## 2014-04-05 DIAGNOSIS — E781 Pure hyperglyceridemia: Secondary | ICD-10-CM

## 2014-04-05 DIAGNOSIS — M7540 Impingement syndrome of unspecified shoulder: Secondary | ICD-10-CM | POA: Insufficient documentation

## 2014-04-05 LAB — LIPID PANEL
CHOLESTEROL: 166 mg/dL (ref 0–200)
HDL: 25 mg/dL — ABNORMAL LOW (ref >39)
TRIGLYCERIDES: 1186 mg/dL — AB (ref <150)
Total CHOL/HDL Ratio: 6.6 Ratio

## 2014-04-06 ENCOUNTER — Telehealth: Payer: Self-pay | Admitting: Family Medicine

## 2014-04-06 ENCOUNTER — Other Ambulatory Visit: Payer: Self-pay | Admitting: *Deleted

## 2014-04-06 DIAGNOSIS — E785 Hyperlipidemia, unspecified: Secondary | ICD-10-CM

## 2014-04-06 MED ORDER — EZETIMIBE 10 MG PO TABS: 10.0000 mg | ORAL_TABLET | Freq: Every day | ORAL | Status: DC

## 2014-04-06 NOTE — Telephone Encounter (Signed)
Patient is calling you back with questions about her cholesterol med and what pharmacy we called  [phone]

## 2014-04-06 NOTE — Telephone Encounter (Signed)
Call returned to patient. Made aware of MD recommendations again.

## 2014-04-07 ENCOUNTER — Telehealth: Payer: Self-pay | Admitting: Family Medicine

## 2014-04-07 DIAGNOSIS — E785 Hyperlipidemia, unspecified: Secondary | ICD-10-CM

## 2014-04-07 NOTE — Telephone Encounter (Signed)
PA submitted through [employer] - suspended

## 2014-04-10 MED ORDER — EZETIMIBE 10 MG PO TABS: 10.0000 mg | ORAL_TABLET | Freq: Every day | ORAL | Status: DC

## 2014-04-10 NOTE — Telephone Encounter (Signed)
Zetia has been approved from 04/01/14-04/02/2015- Pharmacy notified

## 2014-04-12 ENCOUNTER — Ambulatory Visit: Payer: Medicaid Other | Admitting: Physician Assistant

## 2014-04-26 ENCOUNTER — Other Ambulatory Visit: Payer: Medicaid Other | Admitting: Physician Assistant

## 2014-05-02 ENCOUNTER — Encounter: Payer: Self-pay | Admitting: Registered Nurse

## 2014-05-02 ENCOUNTER — Encounter: Payer: Medicaid Other | Attending: Physical Medicine and Rehabilitation | Admitting: Registered Nurse

## 2014-05-02 VITALS — BP 129/64 | HR 103 | Resp 16 | Ht 65.0 in | Wt 240.0 lb

## 2014-05-02 DIAGNOSIS — M545 Low back pain, unspecified: Secondary | ICD-10-CM | POA: Diagnosis present

## 2014-05-02 DIAGNOSIS — M76899 Other specified enthesopathies of unspecified lower limb, excluding foot: Secondary | ICD-10-CM | POA: Diagnosis not present

## 2014-05-02 DIAGNOSIS — M25519 Pain in unspecified shoulder: Secondary | ICD-10-CM | POA: Insufficient documentation

## 2014-05-02 DIAGNOSIS — M7542 Impingement syndrome of left shoulder: Secondary | ICD-10-CM

## 2014-05-02 DIAGNOSIS — M79609 Pain in unspecified limb: Secondary | ICD-10-CM | POA: Insufficient documentation

## 2014-05-02 DIAGNOSIS — G8929 Other chronic pain: Secondary | ICD-10-CM

## 2014-05-02 DIAGNOSIS — R29898 Other symptoms and signs involving the musculoskeletal system: Secondary | ICD-10-CM | POA: Diagnosis not present

## 2014-05-02 DIAGNOSIS — M7061 Trochanteric bursitis, right hip: Secondary | ICD-10-CM

## 2014-05-02 DIAGNOSIS — R269 Unspecified abnormalities of gait and mobility: Secondary | ICD-10-CM | POA: Diagnosis not present

## 2014-05-02 DIAGNOSIS — M47817 Spondylosis without myelopathy or radiculopathy, lumbosacral region: Secondary | ICD-10-CM | POA: Diagnosis not present

## 2014-05-02 DIAGNOSIS — Z79899 Other long term (current) drug therapy: Secondary | ICD-10-CM

## 2014-05-02 DIAGNOSIS — Z5181 Encounter for therapeutic drug level monitoring: Secondary | ICD-10-CM

## 2014-05-02 DIAGNOSIS — M75100 Unspecified rotator cuff tear or rupture of unspecified shoulder, not specified as traumatic: Secondary | ICD-10-CM

## 2014-05-02 DIAGNOSIS — IMO0002 Reserved for concepts with insufficient information to code with codable children: Secondary | ICD-10-CM

## 2014-05-02 MED ORDER — HYDROCODONE-ACETAMINOPHEN 10-325 MG PO TABS: 1.0000 | ORAL_TABLET | Freq: Three times a day (TID) | ORAL | Status: DC
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
## 2014-05-04 ENCOUNTER — Encounter: Payer: Self-pay | Admitting: Physician Assistant

## 2014-05-04 ENCOUNTER — Ambulatory Visit (INDEPENDENT_AMBULATORY_CARE_PROVIDER_SITE_OTHER): Payer: Medicaid Other | Admitting: Physician Assistant

## 2014-05-04 VITALS — BP 126/62 | HR 72 | Temp 98.2°F | Resp 16

## 2014-05-04 DIAGNOSIS — M810 Age-related osteoporosis without current pathological fracture: Secondary | ICD-10-CM

## 2014-05-04 DIAGNOSIS — I10 Essential (primary) hypertension: Secondary | ICD-10-CM

## 2014-05-04 DIAGNOSIS — Z860100 Personal history of colon polyps, unspecified: Secondary | ICD-10-CM

## 2014-05-04 DIAGNOSIS — F32A Depression, unspecified: Secondary | ICD-10-CM

## 2014-05-04 DIAGNOSIS — F3289 Other specified depressive episodes: Secondary | ICD-10-CM

## 2014-05-04 DIAGNOSIS — J438 Other emphysema: Secondary | ICD-10-CM

## 2014-05-04 DIAGNOSIS — E038 Other specified hypothyroidism: Secondary | ICD-10-CM

## 2014-05-04 DIAGNOSIS — E119 Type 2 diabetes mellitus without complications: Secondary | ICD-10-CM

## 2014-05-04 DIAGNOSIS — F17200 Nicotine dependence, unspecified, uncomplicated: Secondary | ICD-10-CM

## 2014-05-04 DIAGNOSIS — E039 Hypothyroidism, unspecified: Secondary | ICD-10-CM

## 2014-05-04 DIAGNOSIS — J439 Emphysema, unspecified: Secondary | ICD-10-CM

## 2014-05-04 DIAGNOSIS — E785 Hyperlipidemia, unspecified: Secondary | ICD-10-CM

## 2014-05-04 DIAGNOSIS — F329 Major depressive disorder, single episode, unspecified: Secondary | ICD-10-CM

## 2014-05-04 DIAGNOSIS — Z23 Encounter for immunization: Secondary | ICD-10-CM

## 2014-05-04 LAB — CBC WITH DIFFERENTIAL/PLATELET
Basophils Absolute: 0 10*3/uL (ref 0.0–0.1)
Basophils Relative: 0 % (ref 0–1)
Eosinophils Absolute: 0.2 10*3/uL (ref 0.0–0.7)
Eosinophils Relative: 2 % (ref 0–5)
HEMATOCRIT: 42.4 % (ref 36.0–46.0)
Hemoglobin: 14 g/dL (ref 12.0–15.0)
LYMPHS PCT: 31 % (ref 12–46)
Lymphs Abs: 3.6 10*3/uL (ref 0.7–4.0)
MCH: 27.5 pg (ref 26.0–34.0)
MCHC: 33 g/dL (ref 30.0–36.0)
MCV: 83.3 fL (ref 78.0–100.0)
MONO ABS: 0.8 10*3/uL (ref 0.1–1.0)
Monocytes Relative: 7 % (ref 3–12)
NEUTROS ABS: 7 10*3/uL (ref 1.7–7.7)
Neutrophils Relative %: 60 % (ref 43–77)
PLATELETS: 278 10*3/uL (ref 150–400)
RBC: 5.09 MIL/uL (ref 3.87–5.11)
RDW: 16 % — AB (ref 11.5–15.5)
WBC: 11.6 10*3/uL — AB (ref 4.0–10.5)

## 2014-05-04 LAB — COMPLETE METABOLIC PANEL WITH GFR
ALT: 20 U/L (ref 0–35)
AST: 16 U/L (ref 0–37)
Albumin: 4.1 g/dL (ref 3.5–5.2)
Alkaline Phosphatase: 58 U/L (ref 39–117)
BILIRUBIN TOTAL: 0.3 mg/dL (ref 0.2–1.2)
BUN: 16 mg/dL (ref 6–23)
CALCIUM: 8.6 mg/dL (ref 8.4–10.5)
CHLORIDE: 99 meq/L (ref 96–112)
CO2: 26 mEq/L (ref 19–32)
Creat: 0.82 mg/dL (ref 0.50–1.10)
GFR, Est Non African American: 79 mL/min
Glucose, Bld: 168 mg/dL — ABNORMAL HIGH (ref 70–99)
Potassium: 4.9 mEq/L (ref 3.5–5.3)
Sodium: 138 mEq/L (ref 135–145)
Total Protein: 6.9 g/dL (ref 6.0–8.3)

## 2014-05-04 LAB — LIPID PANEL
Cholesterol: 146 mg/dL (ref 0–200)
HDL: 18 mg/dL — ABNORMAL LOW (ref >39)
Total CHOL/HDL Ratio: 8.1 Ratio
Triglycerides: 1190 mg/dL — ABNORMAL HIGH (ref <150)
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
## 2014-05-04 NOTE — Addendum Note (Signed)
Addended by: Sheral Flow on: 05/04/2014 11:22 AM   Modules accepted: Orders

## 2014-05-06 ENCOUNTER — Other Ambulatory Visit: Payer: Self-pay | Admitting: *Deleted

## 2014-05-06 DIAGNOSIS — E781 Pure hyperglyceridemia: Secondary | ICD-10-CM

## 2014-05-16 ENCOUNTER — Encounter: Payer: Self-pay | Admitting: Cardiovascular Disease

## 2014-05-16 ENCOUNTER — Ambulatory Visit (INDEPENDENT_AMBULATORY_CARE_PROVIDER_SITE_OTHER): Payer: Medicaid Other | Admitting: Cardiovascular Disease

## 2014-05-16 VITALS — BP 110/62 | HR 101 | Ht 65.0 in | Wt 250.0 lb

## 2014-05-16 DIAGNOSIS — E785 Hyperlipidemia, unspecified: Secondary | ICD-10-CM

## 2014-05-16 DIAGNOSIS — M545 Low back pain, unspecified: Secondary | ICD-10-CM

## 2014-05-16 DIAGNOSIS — E119 Type 2 diabetes mellitus without complications: Secondary | ICD-10-CM

## 2014-05-16 DIAGNOSIS — G8929 Other chronic pain: Secondary | ICD-10-CM

## 2014-05-16 DIAGNOSIS — E781 Pure hyperglyceridemia: Secondary | ICD-10-CM

## 2014-05-16 NOTE — Assessment & Plan Note (Signed)
Cholesterol is well controlled  Triglycerides not.  Recently added niaspan.  Continue trilipix  Discussed dietary changes  Repeat labs in 3 weeks F/U lipid clinic.  No signs of pancreatitis or abdominal pain  ETOH minimized.  Not clear that she needs zetia

## 2014-05-16 NOTE — Assessment & Plan Note (Signed)
F/U pain clinic No recent PT/OT

## 2014-05-16 NOTE — Assessment & Plan Note (Signed)
Discussed low carb diet.  Target hemoglobin A1c is 6.5 or less.  Continue current medications.  

## 2014-05-16 NOTE — Assessment & Plan Note (Signed)
Related to smoking  Needs flu shot  CXR yearly  PRN inhaler

## 2014-05-16 NOTE — Patient Instructions (Addendum)
Your physician recommends that you schedule a follow-up appointment in: AS NEEDED WITH  DR Haines  Your physician recommends that you continue on your current medications as directed. Please refer to the Current Medication list given to you today.  Your physician recommends that you return for lab work in: [place]

## 2014-05-16 NOTE — Progress Notes (Signed)
Patient ID: Lisa Ewing, female   DOB: 1955-12-05, 58 y.o.   MRN: [id number]    58 yo referred by primary for elevated triglycerides   History of smoking, elevated lipids DM and HTN  On lipitor niaspan and trilipix   Last week triglycerides were 1198  LDL could not be calculated and HDL only 18  .  She thinks niacin was added two weeks ago.  Compliant with meds  In a wheel chair last 8 years after hip surgery with chronic pain.  Diet not great with pasta and bread as well as eggs with yolks.  Smokes ppd  No motivation to quit.  No chest pain dyspnea or palpitations Denies excess ETOH.     ROS: Denies fever, malais, weight loss, blurry vision, decreased visual acuity, cough, sputum, SOB, hemoptysis, pleuritic pain, palpitaitons, heartburn, abdominal pain, melena, lower extremity edema, claudication, or rash.  All other systems reviewed and negative   General: Affect appropriate Obese white female  HEENT: normal Neck supple with no adenopathy JVP normal no bruits no thyromegaly Lungs clear with no wheezing and good diaphragmatic motion Heart:  S1/S2 no murmur,rub, gallop or click PMI normal Abdomen: benighn, BS positve, no tenderness, no AAA no bruit.  No HSM or HJR Distal pulses intact with no bruits No edema Neuro non-focal Skin warm and dry LE weakness   Medications Current Outpatient Prescriptions  Medication Sig Dispense Refill  . ACCU-CHEK AVIVA PLUS test strip USE TO TEST BLOOD SUGAR ONCE DAILY  50 each  4  . ACCU-CHEK FASTCLIX LANCETS MISC USE TO TEST BLOOD SUGAR ONCE DAILY  102 each  4  . acyclovir (ZOVIRAX) 400 MG tablet TAKE ONE TABLET BY MOUTH AT BEDTIME  30 tablet  4  . ADVAIR DISKUS 250-50 MCG/DOSE AEPB TAKE 1 INHALATION BY MOUTH TWICE DAILY.  RINSE MOUTH AFTER USE.  60 each  5  . atorvastatin (LIPITOR) 40 MG tablet TAKE 1 TABLET BY MOUTH EVERY DAY  90 tablet  3  . calcitRIOL (ROCALTROL) 0.25 MCG capsule TAKE ONE CAPSULE BY MOUTH ONCE DAILY  30 capsule  5  . cetirizine  (ZYRTEC) 10 MG tablet TAKE ONE TABLET BY MOUTH DAILY FOR ALLERGIES  30 tablet  4  . diclofenac sodium (VOLTAREN) 1 % GEL Apply 2 g topically 4 (four) times daily.  1 Tube  2  . dicyclomine (BENTYL) 10 MG capsule Take 1 capsule before meals and at bedtime as needed for loose stools. Hold for constipation.  120 capsule  1  . EPIPEN 2-PAK 0.3 MG/0.3ML SOAJ injection INJECT 1 PEN INTRAMUSCULARLY AS NEEDED FOR ALLERGIC REACTION  2 Device  1  . escitalopram (LEXAPRO) 20 MG tablet TAKE ONE TABLET BY MOUTH DAILY  30 tablet  4  . EVISTA 60 MG tablet TAKE ONE TABLET BY MOUTH DAILY  30 tablet  6  . ezetimibe (ZETIA) 10 MG tablet Take 1 tablet (10 mg total) by mouth daily.  90 tablet  3  . fluticasone (FLONASE) 50 MCG/ACT nasal spray Place 2 sprays into both nostrils daily.  16 g  6  . furosemide (LASIX) 20 MG tablet TAKE ONE TABLET BY MOUTH EACH MORNING  30 tablet  4  . gabapentin (NEURONTIN) 300 MG capsule TAKE 1 CAPSULE BY MOUTH THREE TIMES A DAY AND TAKE 2 CAPSULES EVERY NIGHT AT BEDTIME  150 capsule  3  . HYDROcodone-acetaminophen (NORCO) 10-325 MG per tablet Take 1 tablet by mouth 3 (three) times daily.  90 tablet  0  .  ipratropium-albuterol (DUONEB) 0.5-2.5 (3) MG/3ML SOLN INHALE THE CONTENTS OF ONE VIAL VIA NEBULIZER BY MOUTH FOUR TIMES A DAY AS NEEDED FOR WHEEZING OR SHORTNESS OF BREATH  360 mL  1  . levothyroxine (SYNTHROID, LEVOTHROID) 125 MCG tablet TAKE ONE TABLET BY MOUTH DAILY 30 MINUTES BEFORE BREAKFAST  30 tablet  4  . metFORMIN (GLUCOPHAGE) 1000 MG tablet TAKE ONE TABLET BY MOUTH TWICE DAILY  60 tablet  4  . montelukast (SINGULAIR) 10 MG tablet TAKE ONE TABLET BY MOUTH AT BEDTIME  30 tablet  11  . NIASPAN 500 MG CR tablet TAKE THREE TABLETS BY MOUTH AT BEDTIME  90 tablet  4  . pantoprazole (PROTONIX) 40 MG tablet Take 1 tablet (40 mg total) by mouth daily.  30 tablet  11  . PROVENTIL HFA 108 (90 BASE) MCG/ACT inhaler USE 2 PUFFS EVERY 4 TO 6 HOURS AS NEEDED FOR SHORTNESS OF BREATH  6.7 g  4    . sucralfate (CARAFATE) 1 G tablet Take 1 tablet (1 g total) by mouth 4 (four) times daily -  with meals and at bedtime.  120 tablet  5  . traZODone (DESYREL) 100 MG tablet Take 1 tablet (100 mg total) by mouth at bedtime.  30 tablet  2  . triamcinolone cream (KENALOG) 0.1 % Apply 1 application topically 2 (two) times daily.  30 g  0  . TRILIPIX 135 MG capsule TAKE 1 CAPSULE BY MOUTH EVERY DAY  30 capsule  4  . VESICARE 5 MG tablet TAKE 1 TABLET BY MOUTH EVERY DAY  30 tablet  4  . [DISCONTINUED] solifenacin (VESICARE) 5 MG tablet Take 10 mg by mouth daily.       No current facility-administered medications for this visit.    Allergies Bee venom; Latex; Oxycodone-acetaminophen; Penicillins; Shellfish allergy; Codeine; Metoclopramide hcl; Other; Pregabalin; Sulfonamide derivatives; Adhesive; and Wellbutrin  Family History: Family History  Problem Relation Age of Onset  . Breast cancer Maternal Aunt   . Throat cancer Maternal Grandmother   . Hyperlipidemia Mother   . Diabetes Father     Social History: History   Social History  . Marital Status: Divorced    Spouse Name: N/A    Number of Children: N/A  . Years of Education: N/A   Occupational History  . Not on file.   Social History Main Topics  . Smoking status: Current Every Day Smoker -- 1.00 packs/day for 42 years    Types: Cigarettes    Last Attempt to Quit: 06/21/2011  . Smokeless tobacco: Never Used  . Alcohol Use: No  . Drug Use: No  . Sexual Activity: No   Other Topics Concern  . Not on file   Social History Narrative  . No narrative on file    Electrocardiogram:  4/12  SR rate 94 low voltage otherwise normal  Today SR rate 101  Normal except continued low voltage   Assessment and Plan

## 2014-05-31 ENCOUNTER — Encounter: Payer: Self-pay | Admitting: Registered Nurse

## 2014-05-31 ENCOUNTER — Encounter: Payer: Medicaid Other | Attending: Physical Medicine and Rehabilitation | Admitting: Registered Nurse

## 2014-05-31 VITALS — BP 144/85 | HR 109 | Resp 14 | Ht 65.5 in | Wt 250.0 lb

## 2014-05-31 DIAGNOSIS — M79609 Pain in unspecified limb: Secondary | ICD-10-CM | POA: Insufficient documentation

## 2014-05-31 DIAGNOSIS — M7061 Trochanteric bursitis, right hip: Secondary | ICD-10-CM

## 2014-05-31 DIAGNOSIS — IMO0002 Reserved for concepts with insufficient information to code with codable children: Secondary | ICD-10-CM

## 2014-05-31 DIAGNOSIS — M47817 Spondylosis without myelopathy or radiculopathy, lumbosacral region: Secondary | ICD-10-CM | POA: Diagnosis not present

## 2014-05-31 DIAGNOSIS — R269 Unspecified abnormalities of gait and mobility: Secondary | ICD-10-CM | POA: Diagnosis not present

## 2014-05-31 DIAGNOSIS — M25559 Pain in unspecified hip: Secondary | ICD-10-CM

## 2014-05-31 DIAGNOSIS — M75100 Unspecified rotator cuff tear or rupture of unspecified shoulder, not specified as traumatic: Secondary | ICD-10-CM

## 2014-05-31 DIAGNOSIS — M545 Low back pain, unspecified: Secondary | ICD-10-CM | POA: Diagnosis present

## 2014-05-31 DIAGNOSIS — M7062 Trochanteric bursitis, left hip: Secondary | ICD-10-CM

## 2014-05-31 DIAGNOSIS — G8929 Other chronic pain: Secondary | ICD-10-CM

## 2014-05-31 DIAGNOSIS — Z79899 Other long term (current) drug therapy: Secondary | ICD-10-CM

## 2014-05-31 DIAGNOSIS — M7542 Impingement syndrome of left shoulder: Secondary | ICD-10-CM

## 2014-05-31 DIAGNOSIS — M76899 Other specified enthesopathies of unspecified lower limb, excluding foot: Secondary | ICD-10-CM

## 2014-05-31 DIAGNOSIS — Z5181 Encounter for therapeutic drug level monitoring: Secondary | ICD-10-CM

## 2014-05-31 DIAGNOSIS — R29898 Other symptoms and signs involving the musculoskeletal system: Secondary | ICD-10-CM | POA: Insufficient documentation

## 2014-05-31 DIAGNOSIS — M25519 Pain in unspecified shoulder: Secondary | ICD-10-CM | POA: Insufficient documentation

## 2014-05-31 DIAGNOSIS — M25552 Pain in left hip: Secondary | ICD-10-CM

## 2014-05-31 MED ORDER — HYDROCODONE-ACETAMINOPHEN 10-325 MG PO TABS: 1.0000 | ORAL_TABLET | Freq: Three times a day (TID) | ORAL | Status: DC
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
## 2014-06-06 ENCOUNTER — Ambulatory Visit: Payer: Medicaid Other | Admitting: Pharmacist

## 2014-06-06 ENCOUNTER — Other Ambulatory Visit: Payer: Medicaid Other

## 2014-06-14 ENCOUNTER — Ambulatory Visit: Payer: Medicaid Other | Admitting: Pharmacist

## 2014-06-20 ENCOUNTER — Ambulatory Visit (INDEPENDENT_AMBULATORY_CARE_PROVIDER_SITE_OTHER): Payer: Medicaid Other | Admitting: Pharmacist

## 2014-06-20 DIAGNOSIS — E781 Pure hyperglyceridemia: Secondary | ICD-10-CM

## 2014-06-20 DIAGNOSIS — E785 Hyperlipidemia, unspecified: Secondary | ICD-10-CM

## 2014-06-20 LAB — LDL CHOLESTEROL, DIRECT: Direct LDL: 27 mg/dL

## 2014-06-20 LAB — LIPID PANEL
Cholesterol: 139 mg/dL (ref 0–200)
HDL: 16.7 mg/dL — AB (ref >39.00)
NonHDL: 122.3
Total CHOL/HDL Ratio: 8
Triglycerides: 860 mg/dL — ABNORMAL HIGH (ref 0.0–149.0)
VLDL: 172 mg/dL — AB (ref 0.0–40.0)

## 2014-06-22 ENCOUNTER — Other Ambulatory Visit: Payer: Self-pay | Admitting: Family Medicine

## 2014-06-29 ENCOUNTER — Encounter: Payer: Medicaid Other | Attending: Physical Medicine and Rehabilitation

## 2014-06-29 ENCOUNTER — Other Ambulatory Visit: Payer: Self-pay | Admitting: Physical Medicine & Rehabilitation

## 2014-06-29 ENCOUNTER — Encounter: Payer: Self-pay | Admitting: Physical Medicine & Rehabilitation

## 2014-06-29 ENCOUNTER — Ambulatory Visit (HOSPITAL_BASED_OUTPATIENT_CLINIC_OR_DEPARTMENT_OTHER): Payer: Medicaid Other | Admitting: Physical Medicine & Rehabilitation

## 2014-06-29 VITALS — BP 148/86 | HR 74 | Resp 14 | Ht 65.0 in | Wt 300.0 lb

## 2014-06-29 DIAGNOSIS — G894 Chronic pain syndrome: Secondary | ICD-10-CM

## 2014-06-29 DIAGNOSIS — Z79899 Other long term (current) drug therapy: Secondary | ICD-10-CM

## 2014-06-29 DIAGNOSIS — Z5181 Encounter for therapeutic drug level monitoring: Secondary | ICD-10-CM | POA: Insufficient documentation

## 2014-06-29 MED ORDER — HYDROCODONE-ACETAMINOPHEN 10-325 MG PO TABS
1.0000 | ORAL_TABLET | Freq: Three times a day (TID) | ORAL | Status: DC
Start: 2014-06-29 — End: 2014-07-28

## 2014-06-29 NOTE — Progress Notes (Signed)
Left hip Trochanteric bursa injection   Indication Left Trochanteric bursitis. Exam has tenderness over the greater trochanter of the hip. Pain has not responded to conservative care such as exercise therapy and oral medications. Pain interferes with sleep or with mobility Informed consent was obtained after describing risks and benefits of the procedure with the patient these include bleeding bruising and infection. Patient has signed written consent form. Patient placed in a lateral decubitus position with the affected hip superior. Point of maximal pain was palpated marked and prepped with Betadine and entered with a needle to bone contact. Needle slightly withdrawn then 6mg  of betamethasone with 4 cc 1% lidocaine were injected. Patient tolerated procedure well. Post procedure instructions given.

## 2014-06-29 NOTE — Patient Instructions (Signed)
Joint Injection  Care After  Refer to this sheet in the next few days. These instructions provide you with information on caring for yourself after you have had a joint injection. Your caregiver also may give you more specific instructions. Your treatment has been planned according to current medical practices, but problems sometimes occur. Call your caregiver if you have any problems or questions after your procedure.  After any type of joint injection, it is not uncommon to experience:  · Soreness, swelling, or bruising around the injection site.  · Mild numbness, tingling, or weakness around the injection site caused by the numbing medicine used before or with the injection.  It also is possible to experience the following effects associated with the specific agent after injection:  · Iodine-based contrast agents:  ¨ Allergic reaction (itching, hives, widespread redness, and swelling beyond the injection site).  · Corticosteroids (These effects are rare.):  ¨ Allergic reaction.  ¨ Increased blood sugar levels (If you have diabetes and you notice that your blood sugar levels have increased, notify your caregiver).  ¨ Increased blood pressure levels.  ¨ Mood swings.  · Hyaluronic acid in the use of viscosupplementation.  ¨ Temporary heat or redness.  ¨ Temporary rash and itching.  ¨ Increased fluid accumulation in the injected joint.  These effects all should resolve within a day after your procedure.   HOME CARE INSTRUCTIONS  · Limit yourself to light activity the day of your procedure. Avoid lifting heavy objects, bending, stooping, or twisting.  · Take prescription or over-the-counter pain medication as directed by your caregiver.  · You may apply ice to your injection site to reduce pain and swelling the day of your procedure. Ice may be applied 03-04 times:  ¨ Put ice in a plastic bag.  ¨ Place a towel between your skin and the bag.  ¨ Leave the ice on for no longer than 15-20 minutes each time.  SEEK  IMMEDIATE MEDICAL CARE IF:   · Pain and swelling get worse rather than better or extend beyond the injection site.  · Numbness does not go away.  · Blood or fluid continues to leak from the injection site.  · You have chest pain.  · You have swelling of your face or tongue.  · You have trouble breathing or you become dizzy.  · You develop a fever, chills, or severe tenderness at the injection site that last longer than 1 day.  MAKE SURE YOU:  · Understand these instructions.  · Watch your condition.  · Get help right away if you are not doing well or if you get worse.  Document Released: 05/08/2011 Document Revised: 11/17/2011 Document Reviewed: 05/08/2011  [employer]® Patient Information ©2015 [employer]. This information is not intended to replace advice given to you by your health care provider. Make sure you discuss any questions you have with your health care provider.

## 2014-06-30 LAB — PMP ALCOHOL METABOLITE (ETG): Ethyl Glucuronide (EtG): NEGATIVE ng/mL

## 2014-07-01 LAB — OPIATES/OPIOIDS (LC/MS-MS)
Codeine Urine: NEGATIVE ng/mL (ref <50)
Hydrocodone: 4459 ng/mL (ref <50)
Hydromorphone: 963 ng/mL (ref <50)
Morphine Urine: NEGATIVE ng/mL (ref <50)
Norhydrocodone, Ur: 1538 ng/mL (ref <50)
Noroxycodone, Ur: NEGATIVE ng/mL (ref <50)
OXYMORPHONE, URINE: NEGATIVE ng/mL (ref <50)
Oxycodone, ur: NEGATIVE ng/mL (ref <50)

## 2014-07-01 LAB — MDMA (ECSTASY), URINE
MDA GC/MS confirm: NEGATIVE ng/mL (ref <200)
MDMA GC/MS Conf: NEGATIVE ng/mL (ref <200)

## 2014-07-04 LAB — PRESCRIPTION MONITORING PROFILE (SOLSTAS)
Amphetamine/Meth: NEGATIVE ng/mL
BENZODIAZEPINE SCREEN, URINE: NEGATIVE ng/mL
Barbiturate Screen, Urine: NEGATIVE ng/mL
Buprenorphine, Urine: NEGATIVE ng/mL
CARISOPRODOL, URINE: NEGATIVE ng/mL
Cannabinoid Scrn, Ur: NEGATIVE ng/mL
Cocaine Metabolites: NEGATIVE ng/mL
Creatinine, Urine: 131.51 mg/dL (ref >20.0)
Fentanyl, Ur: NEGATIVE ng/mL
Meperidine, Ur: NEGATIVE ng/mL
Methadone Screen, Urine: NEGATIVE ng/mL
Nitrites, Initial: NEGATIVE ug/mL
Oxycodone Screen, Ur: NEGATIVE ng/mL
Propoxyphene: NEGATIVE ng/mL
Tapentadol, urine: NEGATIVE ng/mL
Tramadol Scrn, Ur: NEGATIVE ng/mL
Zolpidem, Urine: NEGATIVE ng/mL
pH, Initial: 5.9 pH (ref 4.5–8.9)

## 2014-07-04 MED ORDER — NIACIN ER (ANTIHYPERLIPIDEMIC) 1000 MG PO TBCR: 2000.0000 mg | EXTENDED_RELEASE_TABLET | Freq: Every day | ORAL | Status: DC

## 2014-07-04 NOTE — Assessment & Plan Note (Addendum)
Reviewed potential causes of elevated TG.  Pt DM controlled and TSH normal.  She does have a less than ideal diet but this should not be the only contributing factor.  She does not drink excessively.  Medication list reviewed.  She is on Evista, which can mimic estrogen that can increase TG.  Pt states she has been on this for several years.  Will forward note to PCP, Dr. Dennard Schaumann to consider alternative therapy for osteoporosis.  In the meantime, will have her increase her Niaspan to 2gm daily and follow up in a few months to recheck labs.  We discussed lifestyle improvements such as decreasing eggs and eating out.

## 2014-07-04 NOTE — Progress Notes (Signed)
Patient ID: Lisa Ewing, female   DOB: [birth year], 58 y.o.   MRN: [id number]    Lisa Ewing is a 58 yo referred by Dr. Johnsie Cancel for elevated triglycerides   She has a PMH of smoking, elevated lipids DM and HTN.  She is currently taking Lipitor 40mg , Zetia 10mg , Niaspan 1500mg , and Trilipix 135mg  daily.  Her most recent labs showed triglycerides of 1198;  LDL could not be calculated.  She states she is compliant with meds and currently does not have any side effects.  She states she has some upper abdominal cramps but these come and go with no rhyme or rhythm.  These do not seem to be consistent with pancreatitis given elevated TG.    PMH: DM (fasting CBG today 113), HTN, no history of CAD.  She smokes 1 ppd  Family history: Father died of MI in 44.    Diet:  Breakfast- sausage, egg and cheese biscuit (they will cook 6-8 eggs to share with 3 people) Lunch- She will not eat lunch if she eats breakfast.  Sandwiches- [place] and cheese with rye bread.  They will eat out at [employer], burgers, or [place] ruben from [employer].   Dinner- She does not eat after 4pm.  She will have pasta once every 2 weeks.  She likes green beans, peas, salads, tomato soup Snacks: She will eat Nabs, sugar free ice cream.  She does not like apples.  She states she is allergic to berries, bananas, and shellfish.  She drinks Diet [facility] and has a rare glass of wine but no other alcohol.   Exercise: Pt is in a wheel chair last 8 years after hip surgery with chronic pain.     Goals of Therapy:  LDL70mg /dL, non-HDL 100mg /dL  Labs:  06/2014: TC- 129, TG- 860, HDL- 17, LDL 27, LFTs normal in August.   Medications Current Outpatient Prescriptions  Medication Sig Dispense Refill  . ACCU-CHEK AVIVA PLUS test strip USE TO TEST BLOOD SUGAR ONCE DAILY  50 each  4  . ACCU-CHEK FASTCLIX LANCETS MISC USE TO TEST BLOOD SUGAR ONCE DAILY  102 each  4  . acyclovir (ZOVIRAX) 400 MG tablet TAKE ONE TABLET BY MOUTH AT  BEDTIME  30 tablet  4  . ADVAIR DISKUS 250-50 MCG/DOSE AEPB TAKE 1 INHALATION BY MOUTH TWICE DAILY.  RINSE MOUTH AFTER USE.  60 each  4  . atorvastatin (LIPITOR) 40 MG tablet TAKE 1 TABLET BY MOUTH EVERY DAY  90 tablet  3  . calcitRIOL (ROCALTROL) 0.25 MCG capsule TAKE ONE CAPSULE BY MOUTH ONCE DAILY  30 capsule  5  . cetirizine (ZYRTEC) 10 MG tablet TAKE ONE TABLET BY MOUTH DAILY FOR ALLERGIES  30 tablet  4  . diclofenac sodium (VOLTAREN) 1 % GEL Apply 2 g topically 4 (four) times daily.  1 Tube  2  . dicyclomine (BENTYL) 10 MG capsule Take 1 capsule before meals and at bedtime as needed for loose stools. Hold for constipation.  120 capsule  1  . EPIPEN 2-PAK 0.3 MG/0.3ML SOAJ injection INJECT 1 PEN INTRAMUSCULARLY AS NEEDED FOR ALLERGIC REACTION  2 Device  1  . escitalopram (LEXAPRO) 20 MG tablet TAKE ONE TABLET BY MOUTH DAILY  30 tablet  4  . EVISTA 60 MG tablet TAKE ONE TABLET BY MOUTH DAILY  30 tablet  6  . ezetimibe (ZETIA) 10 MG tablet Take 1 tablet (10 mg total) by mouth daily.  90 tablet  3  . fluticasone (FLONASE)  50 MCG/ACT nasal spray Place 2 sprays into both nostrils daily.  16 g  6  . furosemide (LASIX) 20 MG tablet TAKE ONE TABLET BY MOUTH EACH MORNING  30 tablet  4  . gabapentin (NEURONTIN) 300 MG capsule TAKE 1 CAPSULE BY MOUTH THREE TIMES A DAY AND TAKE 2 CAPSULES EVERY NIGHT AT BEDTIME  150 capsule  3  . HYDROcodone-acetaminophen (NORCO) 10-325 MG per tablet Take 1 tablet by mouth 3 (three) times daily.  90 tablet  0  . ipratropium-albuterol (DUONEB) 0.5-2.5 (3) MG/3ML SOLN INHALE THE CONTENTS OF ONE VIAL VIA NEBULIZER BY MOUTH FOUR TIMES A DAY AS NEEDED FOR WHEEZING OR SHORTNESS OF BREATH  360 mL  1  . levothyroxine (SYNTHROID, LEVOTHROID) 125 MCG tablet TAKE ONE TABLET BY MOUTH DAILY 30 MINUTES BEFORE BREAKFAST  30 tablet  4  . metFORMIN (GLUCOPHAGE) 1000 MG tablet TAKE ONE TABLET BY MOUTH TWICE DAILY  60 tablet  4  . montelukast (SINGULAIR) 10 MG tablet TAKE ONE TABLET BY  MOUTH AT BEDTIME  30 tablet  11  . NIASPAN 500 MG CR tablet TAKE THREE TABLETS BY MOUTH AT BEDTIME  90 tablet  4  . pantoprazole (PROTONIX) 40 MG tablet Take 1 tablet (40 mg total) by mouth daily.  30 tablet  11  . PROVENTIL HFA 108 (90 BASE) MCG/ACT inhaler USE 2 PUFFS EVERY 4 TO 6 HOURS AS NEEDED FOR SHORTNESS OF BREATH  6.7 g  4  . sucralfate (CARAFATE) 1 G tablet TAKE 1 TABLET BY MOUTH 4 TIMES A DAY WITH MEALS AND AT BEDTIME  120 tablet  4  . traZODone (DESYREL) 100 MG tablet Take 1 tablet (100 mg total) by mouth at bedtime.  30 tablet  2  . triamcinolone cream (KENALOG) 0.1 % Apply 1 application topically 2 (two) times daily.  30 g  0  . TRILIPIX 135 MG capsule TAKE 1 CAPSULE BY MOUTH EVERY DAY  30 capsule  4  . VESICARE 5 MG tablet TAKE 1 TABLET BY MOUTH EVERY DAY  30 tablet  4  . [DISCONTINUED] solifenacin (VESICARE) 5 MG tablet Take 10 mg by mouth daily.       No current facility-administered medications for this visit.    Allergies Bee venom; Latex; Oxycodone-acetaminophen; Penicillins; Shellfish allergy; Codeine; Metoclopramide hcl; Other; Pregabalin; Sulfonamide derivatives; Adhesive; and Wellbutrin  Family History: Family History  Problem Relation Age of Onset  . Breast cancer Maternal Aunt   . Throat cancer Maternal Grandmother   . Hyperlipidemia Mother   . Diabetes Father

## 2014-07-13 ENCOUNTER — Ambulatory Visit (INDEPENDENT_AMBULATORY_CARE_PROVIDER_SITE_OTHER): Payer: Medicaid Other | Admitting: Gastroenterology

## 2014-07-13 ENCOUNTER — Encounter: Payer: Self-pay | Admitting: Gastroenterology

## 2014-07-13 VITALS — BP 135/80 | HR 110 | Temp 97.6°F | Ht 65.0 in

## 2014-07-13 DIAGNOSIS — R1314 Dysphagia, pharyngoesophageal phase: Secondary | ICD-10-CM

## 2014-07-13 DIAGNOSIS — K589 Irritable bowel syndrome without diarrhea: Secondary | ICD-10-CM

## 2014-07-13 DIAGNOSIS — R1013 Epigastric pain: Secondary | ICD-10-CM

## 2014-07-13 NOTE — Patient Instructions (Signed)
We have scheduled you for an upper endoscopy and dilation with Dr. Gala Romney in the near future.   Start taking Miralax once each evening to every other day to avoid constipation. Hopefully, this can help make your bowel movements more predictable.   Take supplemental fiber daily such as Metamucil or Benefiber. Unless anything changes, you will need a colonoscopy next year.
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
## 2014-07-18 ENCOUNTER — Other Ambulatory Visit: Payer: Self-pay

## 2014-07-18 DIAGNOSIS — R1013 Epigastric pain: Secondary | ICD-10-CM

## 2014-07-18 DIAGNOSIS — R1314 Dysphagia, pharyngoesophageal phase: Secondary | ICD-10-CM | POA: Insufficient documentation

## 2014-07-18 DIAGNOSIS — K589 Irritable bowel syndrome without diarrhea: Secondary | ICD-10-CM

## 2014-07-18 NOTE — Assessment & Plan Note (Signed)
58 year old female with chronic abdominal pain, noting epigastric pain exacerbated by eating. No improvement with Protonix daily AND taking Nexium OTC. Carafate with meals and bedtime. Gallbladder remains in situ with previous US and HIDA on file. With persistent symptoms despite PPI therapy, proceed with EGD, as last was in 2011. Solid food dysphagia noted for past few months. In setting of Ibuprofen use could have NSAID gastritis, PUD. Doubt biliary etiology and strongly question chronic abdominal pain with questionable psychological overlay. Patient and daughter appear to have very similar symptoms. Both present in office today for appointments.   Proceed with upper endoscopy and dilation (due to solid food dysphagia) in the near future with Dr. Gala Romney. The risks, benefits, and alternatives have been discussed in detail with patient. They have stated understanding and desire to proceed.  Phenergan 12.5 mg IV on call due to polypharmacy

## 2014-07-18 NOTE — Assessment & Plan Note (Signed)
Query uncontrolled GERD, web, ring, stricture. May have motility disorder as culprit. Consider BPE in future if no significant findings on EGD.

## 2014-07-18 NOTE — Progress Notes (Signed)
cc'ed to pcp °

## 2014-07-18 NOTE — Assessment & Plan Note (Signed)
Chronic intermittent loose stool after bouts of mild constipation. Mild paper hematochezia, with next colonoscopy due in Aug 2016. Start daily supplemental fiber, Miralax prn on days of constipation. If no improvement, consider early interval colonoscopy; otherwise, next exam in Aug 2016.

## 2014-07-20 DIAGNOSIS — M13859 Other specified arthritis, unspecified hip: Secondary | ICD-10-CM

## 2014-07-20 DIAGNOSIS — J441 Chronic obstructive pulmonary disease with (acute) exacerbation: Secondary | ICD-10-CM

## 2014-07-20 DIAGNOSIS — M79609 Pain in unspecified limb: Secondary | ICD-10-CM

## 2014-07-20 DIAGNOSIS — M13869 Other specified arthritis, unspecified knee: Secondary | ICD-10-CM

## 2014-07-28 ENCOUNTER — Other Ambulatory Visit: Payer: Self-pay | Admitting: Family Medicine

## 2014-07-28 ENCOUNTER — Encounter: Payer: Self-pay | Admitting: Registered Nurse

## 2014-07-28 ENCOUNTER — Encounter: Payer: Medicaid Other | Attending: Physical Medicine and Rehabilitation | Admitting: *Deleted

## 2014-07-28 ENCOUNTER — Telehealth: Payer: Self-pay

## 2014-07-28 VITALS — BP 149/73 | HR 96 | Resp 14

## 2014-07-28 DIAGNOSIS — E039 Hypothyroidism, unspecified: Secondary | ICD-10-CM | POA: Insufficient documentation

## 2014-07-28 DIAGNOSIS — M79601 Pain in right arm: Secondary | ICD-10-CM

## 2014-07-28 DIAGNOSIS — M25512 Pain in left shoulder: Secondary | ICD-10-CM | POA: Insufficient documentation

## 2014-07-28 DIAGNOSIS — J449 Chronic obstructive pulmonary disease, unspecified: Secondary | ICD-10-CM | POA: Diagnosis not present

## 2014-07-28 DIAGNOSIS — M810 Age-related osteoporosis without current pathological fracture: Secondary | ICD-10-CM | POA: Insufficient documentation

## 2014-07-28 DIAGNOSIS — M961 Postlaminectomy syndrome, not elsewhere classified: Secondary | ICD-10-CM

## 2014-07-28 DIAGNOSIS — F17210 Nicotine dependence, cigarettes, uncomplicated: Secondary | ICD-10-CM | POA: Diagnosis not present

## 2014-07-28 DIAGNOSIS — Z5181 Encounter for therapeutic drug level monitoring: Secondary | ICD-10-CM

## 2014-07-28 DIAGNOSIS — E119 Type 2 diabetes mellitus without complications: Secondary | ICD-10-CM | POA: Diagnosis not present

## 2014-07-28 DIAGNOSIS — G8929 Other chronic pain: Secondary | ICD-10-CM | POA: Diagnosis present

## 2014-07-28 DIAGNOSIS — K219 Gastro-esophageal reflux disease without esophagitis: Secondary | ICD-10-CM | POA: Insufficient documentation

## 2014-07-28 DIAGNOSIS — E785 Hyperlipidemia, unspecified: Secondary | ICD-10-CM | POA: Insufficient documentation

## 2014-07-28 DIAGNOSIS — Z79899 Other long term (current) drug therapy: Secondary | ICD-10-CM

## 2014-07-28 DIAGNOSIS — M546 Pain in thoracic spine: Secondary | ICD-10-CM

## 2014-07-28 DIAGNOSIS — M47814 Spondylosis without myelopathy or radiculopathy, thoracic region: Secondary | ICD-10-CM | POA: Insufficient documentation

## 2014-07-28 DIAGNOSIS — M47816 Spondylosis without myelopathy or radiculopathy, lumbar region: Secondary | ICD-10-CM | POA: Insufficient documentation

## 2014-07-28 DIAGNOSIS — M79604 Pain in right leg: Secondary | ICD-10-CM | POA: Diagnosis not present

## 2014-07-28 DIAGNOSIS — M25551 Pain in right hip: Secondary | ICD-10-CM | POA: Diagnosis not present

## 2014-07-28 DIAGNOSIS — G894 Chronic pain syndrome: Secondary | ICD-10-CM

## 2014-07-28 MED ORDER — HYDROCODONE-ACETAMINOPHEN 10-325 MG PO TABS: 1.0000 | ORAL_TABLET | Freq: Three times a day (TID) | ORAL | Status: DC
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
## 2014-07-28 NOTE — Telephone Encounter (Signed)
PATIENT CALLED STATING SHE HAD HER PROCEDURE MOVED UP A DAY

## 2014-07-31 NOTE — Telephone Encounter (Signed)
Talked with patient and she is on go for 08/10/14

## 2014-07-31 NOTE — Telephone Encounter (Signed)
Refill appropriate and filled per protocol. 

## 2014-08-10 ENCOUNTER — Ambulatory Visit (HOSPITAL_COMMUNITY): Admission: RE | Admit: 2014-08-10 | Payer: Medicaid Other | Source: Ambulatory Visit | Admitting: Internal Medicine

## 2014-08-10 ENCOUNTER — Encounter: Payer: Self-pay | Admitting: Internal Medicine

## 2014-08-10 ENCOUNTER — Encounter (HOSPITAL_COMMUNITY): Admission: RE | Payer: Self-pay | Source: Ambulatory Visit

## 2014-08-10 ENCOUNTER — Telehealth: Payer: Self-pay

## 2014-08-10 SURGERY — EGD (ESOPHAGOGASTRODUODENOSCOPY)
Anesthesia: Moderate Sedation

## 2014-08-10 NOTE — Telephone Encounter (Signed)
Pt called to reschedule her EGD because she was unable to make it this morning because she is vomiting. Does she need another office appointment? Please advise

## 2014-08-10 NOTE — Telephone Encounter (Signed)
Lisa Ewing can you please make her an appointment to come back in. Thanks

## 2014-08-10 NOTE — Telephone Encounter (Signed)
MADE ANOTHER APPOINTMENT AND PATIENT IS AWARE

## 2014-08-10 NOTE — Telephone Encounter (Signed)
Yes

## 2014-08-21 ENCOUNTER — Other Ambulatory Visit: Payer: Self-pay | Admitting: Family Medicine

## 2014-08-21 ENCOUNTER — Other Ambulatory Visit: Payer: Self-pay | Admitting: Physical Medicine & Rehabilitation

## 2014-08-22 NOTE — Telephone Encounter (Signed)
Medication refilled per protocol. 

## 2014-08-29 ENCOUNTER — Ambulatory Visit: Payer: Medicaid Other | Admitting: Occupational Therapy

## 2014-08-29 ENCOUNTER — Encounter: Payer: Self-pay | Admitting: Registered Nurse

## 2014-08-29 ENCOUNTER — Encounter: Payer: Medicaid Other | Attending: Physical Medicine and Rehabilitation | Admitting: Registered Nurse

## 2014-08-29 VITALS — BP 147/61 | HR 100 | Resp 14

## 2014-08-29 DIAGNOSIS — M47816 Spondylosis without myelopathy or radiculopathy, lumbar region: Secondary | ICD-10-CM | POA: Insufficient documentation

## 2014-08-29 DIAGNOSIS — M79601 Pain in right arm: Secondary | ICD-10-CM

## 2014-08-29 DIAGNOSIS — E785 Hyperlipidemia, unspecified: Secondary | ICD-10-CM | POA: Insufficient documentation

## 2014-08-29 DIAGNOSIS — M47814 Spondylosis without myelopathy or radiculopathy, thoracic region: Secondary | ICD-10-CM | POA: Diagnosis not present

## 2014-08-29 DIAGNOSIS — M25512 Pain in left shoulder: Secondary | ICD-10-CM | POA: Diagnosis not present

## 2014-08-29 DIAGNOSIS — J449 Chronic obstructive pulmonary disease, unspecified: Secondary | ICD-10-CM | POA: Insufficient documentation

## 2014-08-29 DIAGNOSIS — E039 Hypothyroidism, unspecified: Secondary | ICD-10-CM | POA: Insufficient documentation

## 2014-08-29 DIAGNOSIS — M7062 Trochanteric bursitis, left hip: Secondary | ICD-10-CM

## 2014-08-29 DIAGNOSIS — Z79899 Other long term (current) drug therapy: Secondary | ICD-10-CM

## 2014-08-29 DIAGNOSIS — M810 Age-related osteoporosis without current pathological fracture: Secondary | ICD-10-CM | POA: Diagnosis not present

## 2014-08-29 DIAGNOSIS — K219 Gastro-esophageal reflux disease without esophagitis: Secondary | ICD-10-CM | POA: Insufficient documentation

## 2014-08-29 DIAGNOSIS — G894 Chronic pain syndrome: Secondary | ICD-10-CM

## 2014-08-29 DIAGNOSIS — F17210 Nicotine dependence, cigarettes, uncomplicated: Secondary | ICD-10-CM | POA: Insufficient documentation

## 2014-08-29 DIAGNOSIS — E119 Type 2 diabetes mellitus without complications: Secondary | ICD-10-CM | POA: Diagnosis not present

## 2014-08-29 DIAGNOSIS — M79604 Pain in right leg: Secondary | ICD-10-CM | POA: Diagnosis not present

## 2014-08-29 DIAGNOSIS — Z5181 Encounter for therapeutic drug level monitoring: Secondary | ICD-10-CM

## 2014-08-29 DIAGNOSIS — M546 Pain in thoracic spine: Secondary | ICD-10-CM

## 2014-08-29 DIAGNOSIS — M7061 Trochanteric bursitis, right hip: Secondary | ICD-10-CM

## 2014-08-29 DIAGNOSIS — G8929 Other chronic pain: Secondary | ICD-10-CM

## 2014-08-29 DIAGNOSIS — M25551 Pain in right hip: Secondary | ICD-10-CM | POA: Insufficient documentation

## 2014-08-29 MED ORDER — HYDROCODONE-ACETAMINOPHEN 10-325 MG PO TABS: 1.0000 | ORAL_TABLET | Freq: Three times a day (TID) | ORAL | Status: DC
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
## 2014-09-07 ENCOUNTER — Encounter: Payer: Self-pay | Admitting: Occupational Therapy

## 2014-09-07 ENCOUNTER — Ambulatory Visit: Payer: Medicaid Other | Attending: Orthopedic Surgery | Admitting: Occupational Therapy

## 2014-09-07 DIAGNOSIS — M79644 Pain in right finger(s): Secondary | ICD-10-CM | POA: Diagnosis not present
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
## 2014-09-07 NOTE — Patient Instructions (Signed)
Wear hard splint as needed (alternate w/ soft splint) - Keep away from heat sources - clean splint 1-2 times per day w/ rubbing alcohol - if splint causes pain, swelling, numbness, pressure sores, remove splint and call [phone] for appointment to make adjustments.

## 2014-09-21 ENCOUNTER — Ambulatory Visit: Payer: Medicaid Other | Admitting: Gastroenterology

## 2014-09-22 DIAGNOSIS — M79609 Pain in unspecified limb: Secondary | ICD-10-CM

## 2014-09-22 DIAGNOSIS — J441 Chronic obstructive pulmonary disease with (acute) exacerbation: Secondary | ICD-10-CM

## 2014-09-22 DIAGNOSIS — M13859 Other specified arthritis, unspecified hip: Secondary | ICD-10-CM

## 2014-09-22 DIAGNOSIS — M13869 Other specified arthritis, unspecified knee: Secondary | ICD-10-CM

## 2014-09-26 ENCOUNTER — Telehealth: Payer: Self-pay | Admitting: *Deleted

## 2014-09-26 NOTE — Telephone Encounter (Signed)
Patient called and left message - is scheduled for Hip Injection on 1/21, but threw her back out and cannot even "wipe" herself.  Patient does thinks she needs an injection or "something" for her back instead. She will be in the office on Thursday, just an [place]

## 2014-09-28 ENCOUNTER — Encounter: Payer: Medicaid Other | Attending: Physical Medicine and Rehabilitation

## 2014-09-28 ENCOUNTER — Other Ambulatory Visit: Payer: Self-pay | Admitting: Physical Medicine & Rehabilitation

## 2014-09-28 ENCOUNTER — Ambulatory Visit (HOSPITAL_BASED_OUTPATIENT_CLINIC_OR_DEPARTMENT_OTHER): Payer: Medicaid Other | Admitting: Physical Medicine & Rehabilitation

## 2014-09-28 ENCOUNTER — Encounter: Payer: Self-pay | Admitting: Physical Medicine & Rehabilitation

## 2014-09-28 VITALS — BP 138/52 | HR 116 | Resp 14

## 2014-09-28 DIAGNOSIS — J449 Chronic obstructive pulmonary disease, unspecified: Secondary | ICD-10-CM | POA: Diagnosis not present

## 2014-09-28 DIAGNOSIS — M7062 Trochanteric bursitis, left hip: Secondary | ICD-10-CM

## 2014-09-28 DIAGNOSIS — M25512 Pain in left shoulder: Secondary | ICD-10-CM | POA: Insufficient documentation

## 2014-09-28 DIAGNOSIS — M47814 Spondylosis without myelopathy or radiculopathy, thoracic region: Secondary | ICD-10-CM | POA: Diagnosis not present

## 2014-09-28 DIAGNOSIS — M79604 Pain in right leg: Secondary | ICD-10-CM | POA: Diagnosis not present

## 2014-09-28 DIAGNOSIS — K219 Gastro-esophageal reflux disease without esophagitis: Secondary | ICD-10-CM | POA: Diagnosis not present

## 2014-09-28 DIAGNOSIS — E785 Hyperlipidemia, unspecified: Secondary | ICD-10-CM | POA: Diagnosis not present

## 2014-09-28 DIAGNOSIS — M810 Age-related osteoporosis without current pathological fracture: Secondary | ICD-10-CM | POA: Insufficient documentation

## 2014-09-28 DIAGNOSIS — F17210 Nicotine dependence, cigarettes, uncomplicated: Secondary | ICD-10-CM | POA: Insufficient documentation

## 2014-09-28 DIAGNOSIS — E119 Type 2 diabetes mellitus without complications: Secondary | ICD-10-CM | POA: Diagnosis not present

## 2014-09-28 DIAGNOSIS — G8929 Other chronic pain: Secondary | ICD-10-CM

## 2014-09-28 DIAGNOSIS — E039 Hypothyroidism, unspecified: Secondary | ICD-10-CM | POA: Insufficient documentation

## 2014-09-28 DIAGNOSIS — M25551 Pain in right hip: Secondary | ICD-10-CM | POA: Diagnosis not present

## 2014-09-28 DIAGNOSIS — M545 Low back pain: Secondary | ICD-10-CM

## 2014-09-28 DIAGNOSIS — Z5181 Encounter for therapeutic drug level monitoring: Secondary | ICD-10-CM

## 2014-09-28 DIAGNOSIS — M47816 Spondylosis without myelopathy or radiculopathy, lumbar region: Secondary | ICD-10-CM | POA: Insufficient documentation

## 2014-09-28 DIAGNOSIS — Z79899 Other long term (current) drug therapy: Secondary | ICD-10-CM

## 2014-09-28 MED ORDER — HYDROCODONE-ACETAMINOPHEN 10-325 MG PO TABS
1.0000 | ORAL_TABLET | Freq: Three times a day (TID) | ORAL | Status: DC
Start: 2014-09-28 — End: 2014-10-26

## 2014-09-28 MED ORDER — METHOCARBAMOL 500 MG PO TABS: 500.0000 mg | ORAL_TABLET | Freq: Three times a day (TID) | ORAL | Status: DC

## 2014-09-28 NOTE — Progress Notes (Signed)
LEFT Trochanteric bursa injection With  ultrasound guidance  Indication Trochanteric bursitis. Exam has tenderness over the greater trochanter of the hip. Pain has not responded to conservative care such as exercise therapy and oral medications. Pain interferes with sleep or with mobility, previous good relief with ultrasound guided trochanteric bursa injection. Patient is morbidly obese Informed consent was obtained after describing risks and benefits of the procedure with the patient these include bleeding bruising and infection. Patient has signed written consent form. Patient placed in a lateral decubitus position with the affected hip superior. Point of maximal pain was palpated marked and prepped with Betadine and entered with a needle to bone contact. Needle slightly withdrawn then 6mg  of betamethasone with 4 cc 1% lidocaine were injected. Patient tolerated procedure well. Post procedure instructions given.

## 2014-09-28 NOTE — Patient Instructions (Signed)
When you're back goes out that sounds like you're having some Lumbar disc annular. If you develop weakness of the legs or numbness of the legs in conjunction with this this would require some further evaluation. I reviewed your last MRI from 2013 which showed the postoperative changes at L5-S1 and a slightly larger disc protrusion At L4-L5compared to the prior study A couple months earlier

## 2014-09-28 NOTE — Progress Notes (Signed)
Patient complaining of increasing low back pain. She has had no trauma to the area. She is mainly wheelchair bound. No lower extremity radicular problems No new bowel or bladder issues  Takes hydrocodone 3 times a day last urine screen was appropriate  Reviewed prior MRIs which showed post laminectomy changes at L5-S1. There was a disc protrusion at L4-5.  No significant stenosis noted.  Examination pain during transfers Gen. No distress Tenderness to palpation lower lumbar paraspinal mainly toward the right side  Impression 1.Acute exacerbation of chronic low back painNo radicular component. Will start a two-week course of Robaxin 500 mg 3 times a day when necessary Continue hydrocodone Do not advise any repeat imaging unless patient develops radicular component.

## 2014-09-29 LAB — PMP ALCOHOL METABOLITE (ETG): ETGU: NEGATIVE ng/mL

## 2014-10-05 LAB — OPIATES/OPIOIDS (LC/MS-MS)
Codeine Urine: NEGATIVE ng/mL (ref <50)
Hydrocodone: 2256 ng/mL (ref <50)
Hydromorphone: 465 ng/mL (ref <50)
Morphine Urine: NEGATIVE ng/mL (ref <50)
NOROXYCODONE, UR: NEGATIVE ng/mL (ref <50)
Norhydrocodone, Ur: 846 ng/mL (ref <50)
Oxycodone, ur: NEGATIVE ng/mL (ref <50)
Oxymorphone: NEGATIVE ng/mL (ref <50)

## 2014-10-05 LAB — MDMA (ECSTASY), URINE
MDA GC/MS confirm: NEGATIVE ng/mL (ref <200)
MDMA GC/MS Conf: NEGATIVE ng/mL (ref <200)

## 2014-10-06 LAB — PRESCRIPTION MONITORING PROFILE (SOLSTAS)
Amphetamine/Meth: NEGATIVE ng/mL
BARBITURATE SCREEN, URINE: NEGATIVE ng/mL
BENZODIAZEPINE SCREEN, URINE: NEGATIVE ng/mL
BUPRENORPHINE, URINE: NEGATIVE ng/mL
CREATININE, URINE: 97.84 mg/dL (ref >20.0)
Cannabinoid Scrn, Ur: NEGATIVE ng/mL
Carisoprodol, Urine: NEGATIVE ng/mL
Cocaine Metabolites: NEGATIVE ng/mL
FENTANYL URINE: NEGATIVE ng/mL
MEPERIDINE UR: NEGATIVE ng/mL
Methadone Screen, Urine: NEGATIVE ng/mL
NITRITES URINE, INITIAL: NEGATIVE ug/mL
Oxycodone Screen, Ur: NEGATIVE ng/mL
PROPOXYPHENE: NEGATIVE ng/mL
Tapentadol, urine: NEGATIVE ng/mL
Tramadol Scrn, Ur: NEGATIVE ng/mL
Zolpidem, Urine: NEGATIVE ng/mL
pH, Initial: 5.7 pH (ref 4.5–8.9)

## 2014-10-10 NOTE — Progress Notes (Signed)
Urine drug screen for this encounter is consistent for prescribed medication 

## 2014-10-24 ENCOUNTER — Other Ambulatory Visit: Payer: Self-pay | Admitting: Family Medicine

## 2014-10-26 ENCOUNTER — Encounter: Payer: Medicaid Other | Attending: Physical Medicine and Rehabilitation | Admitting: Registered Nurse

## 2014-10-26 ENCOUNTER — Encounter: Payer: Self-pay | Admitting: Registered Nurse

## 2014-10-26 VITALS — BP 136/70 | HR 106 | Resp 16

## 2014-10-26 DIAGNOSIS — M810 Age-related osteoporosis without current pathological fracture: Secondary | ICD-10-CM | POA: Diagnosis not present

## 2014-10-26 DIAGNOSIS — M47816 Spondylosis without myelopathy or radiculopathy, lumbar region: Secondary | ICD-10-CM | POA: Insufficient documentation

## 2014-10-26 DIAGNOSIS — M7061 Trochanteric bursitis, right hip: Secondary | ICD-10-CM

## 2014-10-26 DIAGNOSIS — Z5181 Encounter for therapeutic drug level monitoring: Secondary | ICD-10-CM

## 2014-10-26 DIAGNOSIS — M7062 Trochanteric bursitis, left hip: Secondary | ICD-10-CM

## 2014-10-26 DIAGNOSIS — E785 Hyperlipidemia, unspecified: Secondary | ICD-10-CM | POA: Insufficient documentation

## 2014-10-26 DIAGNOSIS — E039 Hypothyroidism, unspecified: Secondary | ICD-10-CM | POA: Diagnosis not present

## 2014-10-26 DIAGNOSIS — M545 Low back pain, unspecified: Secondary | ICD-10-CM

## 2014-10-26 DIAGNOSIS — M79604 Pain in right leg: Secondary | ICD-10-CM | POA: Diagnosis not present

## 2014-10-26 DIAGNOSIS — M47814 Spondylosis without myelopathy or radiculopathy, thoracic region: Secondary | ICD-10-CM | POA: Diagnosis not present

## 2014-10-26 DIAGNOSIS — G8929 Other chronic pain: Secondary | ICD-10-CM | POA: Diagnosis present

## 2014-10-26 DIAGNOSIS — M79601 Pain in right arm: Secondary | ICD-10-CM

## 2014-10-26 DIAGNOSIS — M5440 Lumbago with sciatica, unspecified side: Secondary | ICD-10-CM

## 2014-10-26 DIAGNOSIS — K219 Gastro-esophageal reflux disease without esophagitis: Secondary | ICD-10-CM | POA: Insufficient documentation

## 2014-10-26 DIAGNOSIS — F17210 Nicotine dependence, cigarettes, uncomplicated: Secondary | ICD-10-CM | POA: Diagnosis not present

## 2014-10-26 DIAGNOSIS — J449 Chronic obstructive pulmonary disease, unspecified: Secondary | ICD-10-CM | POA: Diagnosis not present

## 2014-10-26 DIAGNOSIS — M25512 Pain in left shoulder: Secondary | ICD-10-CM | POA: Diagnosis not present

## 2014-10-26 DIAGNOSIS — M62830 Muscle spasm of back: Secondary | ICD-10-CM

## 2014-10-26 DIAGNOSIS — M25551 Pain in right hip: Secondary | ICD-10-CM | POA: Insufficient documentation

## 2014-10-26 DIAGNOSIS — E119 Type 2 diabetes mellitus without complications: Secondary | ICD-10-CM | POA: Insufficient documentation

## 2014-10-26 DIAGNOSIS — Z79899 Other long term (current) drug therapy: Secondary | ICD-10-CM

## 2014-10-26 MED ORDER — HYDROCODONE-ACETAMINOPHEN 10-325 MG PO TABS: 1.0000 | ORAL_TABLET | Freq: Three times a day (TID) | ORAL | Status: DC

## 2014-10-26 MED ORDER — METHOCARBAMOL 500 MG PO TABS: 500.0000 mg | ORAL_TABLET | Freq: Three times a day (TID) | ORAL | Status: DC
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
## 2014-10-27 ENCOUNTER — Ambulatory Visit (HOSPITAL_COMMUNITY)
Admission: RE | Admit: 2014-10-27 | Discharge: 2014-10-27 | Disposition: A | Discharge status: Home / Self Care | Payer: Medicaid Other | Source: Ambulatory Visit | Attending: Registered Nurse | Admitting: Registered Nurse

## 2014-10-27 DIAGNOSIS — M5440 Lumbago with sciatica, unspecified side: Secondary | ICD-10-CM | POA: Insufficient documentation

## 2014-11-02 ENCOUNTER — Telehealth: Payer: Self-pay | Admitting: *Deleted

## 2014-11-02 NOTE — Telephone Encounter (Signed)
Pt is calling about her x-ray results

## 2014-11-02 NOTE — Telephone Encounter (Signed)
Called Lisa Ewing and reviewed her X-ray results. All questions answered.

## 2014-11-09 DIAGNOSIS — M13859 Other specified arthritis, unspecified hip: Secondary | ICD-10-CM | POA: Diagnosis not present

## 2014-11-09 DIAGNOSIS — M79609 Pain in unspecified limb: Secondary | ICD-10-CM | POA: Diagnosis not present

## 2014-11-09 DIAGNOSIS — J441 Chronic obstructive pulmonary disease with (acute) exacerbation: Secondary | ICD-10-CM | POA: Diagnosis not present

## 2014-11-09 DIAGNOSIS — M13869 Other specified arthritis, unspecified knee: Secondary | ICD-10-CM | POA: Diagnosis not present

## 2014-11-22 ENCOUNTER — Other Ambulatory Visit: Payer: Self-pay | Admitting: Physician Assistant

## 2014-11-22 ENCOUNTER — Other Ambulatory Visit: Payer: Self-pay | Admitting: Family Medicine

## 2014-11-23 NOTE — Telephone Encounter (Signed)
Medication filled x1 with no refills.   Requires office visit before any further refills can be given.   Letter sent.  

## 2014-11-24 ENCOUNTER — Encounter: Payer: Medicaid Other | Attending: Physical Medicine and Rehabilitation

## 2014-11-24 ENCOUNTER — Ambulatory Visit (HOSPITAL_BASED_OUTPATIENT_CLINIC_OR_DEPARTMENT_OTHER): Payer: Medicaid Other | Admitting: Physical Medicine & Rehabilitation

## 2014-11-24 ENCOUNTER — Encounter: Payer: Self-pay | Admitting: Physical Medicine & Rehabilitation

## 2014-11-24 VITALS — BP 142/80 | HR 98 | Resp 14

## 2014-11-24 DIAGNOSIS — M79604 Pain in right leg: Secondary | ICD-10-CM | POA: Insufficient documentation

## 2014-11-24 DIAGNOSIS — M47814 Spondylosis without myelopathy or radiculopathy, thoracic region: Secondary | ICD-10-CM | POA: Insufficient documentation

## 2014-11-24 DIAGNOSIS — K219 Gastro-esophageal reflux disease without esophagitis: Secondary | ICD-10-CM | POA: Diagnosis not present

## 2014-11-24 DIAGNOSIS — F329 Major depressive disorder, single episode, unspecified: Secondary | ICD-10-CM

## 2014-11-24 DIAGNOSIS — M810 Age-related osteoporosis without current pathological fracture: Secondary | ICD-10-CM | POA: Diagnosis not present

## 2014-11-24 DIAGNOSIS — G8929 Other chronic pain: Secondary | ICD-10-CM | POA: Diagnosis present

## 2014-11-24 DIAGNOSIS — F17210 Nicotine dependence, cigarettes, uncomplicated: Secondary | ICD-10-CM | POA: Diagnosis not present

## 2014-11-24 DIAGNOSIS — J449 Chronic obstructive pulmonary disease, unspecified: Secondary | ICD-10-CM | POA: Insufficient documentation

## 2014-11-24 DIAGNOSIS — M25551 Pain in right hip: Secondary | ICD-10-CM | POA: Diagnosis not present

## 2014-11-24 DIAGNOSIS — M25512 Pain in left shoulder: Secondary | ICD-10-CM | POA: Diagnosis not present

## 2014-11-24 DIAGNOSIS — E785 Hyperlipidemia, unspecified: Secondary | ICD-10-CM | POA: Diagnosis not present

## 2014-11-24 DIAGNOSIS — E039 Hypothyroidism, unspecified: Secondary | ICD-10-CM | POA: Insufficient documentation

## 2014-11-24 DIAGNOSIS — F32A Depression, unspecified: Secondary | ICD-10-CM

## 2014-11-24 DIAGNOSIS — M47816 Spondylosis without myelopathy or radiculopathy, lumbar region: Secondary | ICD-10-CM | POA: Insufficient documentation

## 2014-11-24 DIAGNOSIS — E119 Type 2 diabetes mellitus without complications: Secondary | ICD-10-CM | POA: Diagnosis not present

## 2014-11-24 MED ORDER — GABAPENTIN 300 MG PO CAPS: ORAL_CAPSULE | ORAL | Status: DC

## 2014-11-24 MED ORDER — HYDROCODONE-ACETAMINOPHEN 10-325 MG PO TABS: 1.0000 | ORAL_TABLET | Freq: Three times a day (TID) | ORAL | Status: DC
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
## 2014-11-24 NOTE — Patient Instructions (Signed)
Recommend Psychology evaluation with Dr Dahlia Client

## 2014-12-15 ENCOUNTER — Encounter: Payer: Medicaid Other | Admitting: Family Medicine

## 2014-12-20 ENCOUNTER — Other Ambulatory Visit: Payer: Self-pay | Admitting: Physician Assistant

## 2014-12-20 ENCOUNTER — Other Ambulatory Visit: Payer: Self-pay | Admitting: Cardiovascular Disease

## 2014-12-20 ENCOUNTER — Other Ambulatory Visit: Payer: Self-pay | Admitting: Family Medicine

## 2014-12-21 ENCOUNTER — Ambulatory Visit (INDEPENDENT_AMBULATORY_CARE_PROVIDER_SITE_OTHER): Payer: Medicaid Other | Admitting: Gastroenterology

## 2014-12-21 ENCOUNTER — Encounter: Payer: Self-pay | Admitting: Gastroenterology

## 2014-12-21 ENCOUNTER — Other Ambulatory Visit: Payer: Self-pay

## 2014-12-21 VITALS — BP 145/81 | HR 106 | Temp 98.4°F | Ht 65.0 in | Wt 255.8 lb

## 2014-12-21 DIAGNOSIS — Z86010 Personal history of colonic polyps: Secondary | ICD-10-CM

## 2014-12-21 DIAGNOSIS — R1314 Dysphagia, pharyngoesophageal phase: Secondary | ICD-10-CM | POA: Diagnosis not present

## 2014-12-21 DIAGNOSIS — K219 Gastro-esophageal reflux disease without esophagitis: Secondary | ICD-10-CM

## 2014-12-21 DIAGNOSIS — R1013 Epigastric pain: Secondary | ICD-10-CM

## 2014-12-21 MED ORDER — HYOSCYAMINE SULFATE 0.125 MG SL SUBL: 0.1250 mg | SUBLINGUAL_TABLET | SUBLINGUAL | Status: DC | PRN

## 2014-12-21 MED ORDER — NA SULFATE-K SULFATE-MG SULF 17.5-3.13-1.6 GM/177ML PO SOLN: 1.0000 | ORAL | Status: DC

## 2014-12-21 NOTE — Telephone Encounter (Signed)
Medication filled x1 with no refills.   Requires office visit before any further refills can be given.   Letter sent.  

## 2014-12-21 NOTE — Telephone Encounter (Signed)
CPE 4/21  Medication refilled per protocol.
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
## 2014-12-21 NOTE — Patient Instructions (Addendum)
Stop Protonix. Start taking samples of Dexilant once daily.   We have scheduled you for a colonoscopy, upper endoscopy, and dilation with Dr. Gala Romney in the near future.   Take supplemental fiber such as Metamucil or benefiber daily. On any day you do not have a bowel movement and feel constipated, take 1 capful of Miralax in the evening. On the days you have diarrhea, try Levsin every 4 hours as needed for cramping. I have sent this to your pharmacy.

## 2014-12-25 NOTE — Assessment & Plan Note (Signed)
Trial of Dexilant samples.

## 2014-12-25 NOTE — Assessment & Plan Note (Signed)
History of adenomas in the remote past, with multiple hyperplastic polyps on last colonoscopy in 2011. Alternating constipation and diarrhea chronically. Small volume hematochezia likely benign anorectal source.   Proceed with TCS with Dr. Gala Romney in near future: the risks, benefits, and alternatives have been discussed with the patient in detail. The patient states understanding and desires to proceed. PROPOFOL Levsin for diarrhea, Miralax for constipation Supplemental fiber

## 2014-12-25 NOTE — Progress Notes (Signed)
cc'ed to pcp °

## 2014-12-25 NOTE — Assessment & Plan Note (Signed)
Query uncontrolled GERD, web, ring, stricture, motility disorder. EGD/ED as planned. May ultimately need BPE.

## 2014-12-25 NOTE — Assessment & Plan Note (Signed)
59 year old female with chronic abdominal pain, taking Protonix and Nexium OTC. Gallbladder remains in situ with previous ultrasound and HIDA on file. With persistent symptoms despite PPI therapy, recommend proceeding with EGD, as last was in 2011. Solid food dysphagia chronically. In setting of Ibuprofen use could have NSAID gastritis, PUD, doubt biliary etiology and strongly question chronic abdominal pain with questionable psychological component.   Proceed with upper endoscopy and dilation in the near future with Dr. Gala Romney. The risks, benefits, and alternatives have been discussed in detail with patient. They have stated understanding and desire to proceed.  PROPOFOL due to polypharmacy Stop Protonix and Nexium. Dexilant samples started.

## 2014-12-28 ENCOUNTER — Ambulatory Visit: Payer: Medicaid Other | Admitting: Physical Medicine & Rehabilitation

## 2014-12-28 ENCOUNTER — Ambulatory Visit (INDEPENDENT_AMBULATORY_CARE_PROVIDER_SITE_OTHER): Payer: Medicaid Other | Admitting: Physician Assistant

## 2014-12-28 ENCOUNTER — Encounter: Payer: Self-pay | Admitting: Physician Assistant

## 2014-12-28 VITALS — BP 124/68 | HR 100 | Temp 98.0°F | Resp 20 | Ht 65.0 in | Wt 255.0 lb

## 2014-12-28 DIAGNOSIS — F329 Major depressive disorder, single episode, unspecified: Secondary | ICD-10-CM

## 2014-12-28 DIAGNOSIS — E038 Other specified hypothyroidism: Secondary | ICD-10-CM

## 2014-12-28 DIAGNOSIS — E119 Type 2 diabetes mellitus without complications: Secondary | ICD-10-CM | POA: Diagnosis not present

## 2014-12-28 DIAGNOSIS — J439 Emphysema, unspecified: Secondary | ICD-10-CM

## 2014-12-28 DIAGNOSIS — F17200 Nicotine dependence, unspecified, uncomplicated: Secondary | ICD-10-CM

## 2014-12-28 DIAGNOSIS — Z720 Tobacco use: Secondary | ICD-10-CM | POA: Diagnosis not present

## 2014-12-28 DIAGNOSIS — M810 Age-related osteoporosis without current pathological fracture: Secondary | ICD-10-CM | POA: Diagnosis not present

## 2014-12-28 DIAGNOSIS — E785 Hyperlipidemia, unspecified: Secondary | ICD-10-CM

## 2014-12-28 DIAGNOSIS — F32A Depression, unspecified: Secondary | ICD-10-CM

## 2014-12-28 DIAGNOSIS — J209 Acute bronchitis, unspecified: Secondary | ICD-10-CM | POA: Diagnosis not present

## 2014-12-28 DIAGNOSIS — Z0000 Encounter for general adult medical examination without abnormal findings: Secondary | ICD-10-CM

## 2014-12-28 LAB — CBC WITH DIFFERENTIAL/PLATELET
BASOS PCT: 0 % (ref 0–1)
Basophils Absolute: 0 10*3/uL (ref 0.0–0.1)
EOS ABS: 0.1 10*3/uL (ref 0.0–0.7)
EOS PCT: 1 % (ref 0–5)
HEMATOCRIT: 43.7 % (ref 36.0–46.0)
Hemoglobin: 14.5 g/dL (ref 12.0–15.0)
Lymphocytes Relative: 29 % (ref 12–46)
Lymphs Abs: 4.1 10*3/uL — ABNORMAL HIGH (ref 0.7–4.0)
MCH: 27.8 pg (ref 26.0–34.0)
MCHC: 33.2 g/dL (ref 30.0–36.0)
MCV: 83.9 fL (ref 78.0–100.0)
MPV: 9.3 fL (ref 8.6–12.4)
Monocytes Absolute: 1 10*3/uL (ref 0.1–1.0)
Monocytes Relative: 7 % (ref 3–12)
NEUTROS PCT: 63 % (ref 43–77)
Neutro Abs: 8.9 10*3/uL — ABNORMAL HIGH (ref 1.7–7.7)
Platelets: 277 10*3/uL (ref 150–400)
RBC: 5.21 MIL/uL — ABNORMAL HIGH (ref 3.87–5.11)
RDW: 16.2 % — ABNORMAL HIGH (ref 11.5–15.5)
WBC: 14.1 10*3/uL — ABNORMAL HIGH (ref 4.0–10.5)

## 2014-12-28 LAB — HEMOGLOBIN A1C
Hgb A1c MFr Bld: 8.4 % — ABNORMAL HIGH (ref <5.7)
MEAN PLASMA GLUCOSE: 194 mg/dL — AB (ref <117)

## 2014-12-28 MED ORDER — AZITHROMYCIN 250 MG PO TABS: ORAL_TABLET | ORAL | Status: DC
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
## 2014-12-29 ENCOUNTER — Ambulatory Visit (HOSPITAL_BASED_OUTPATIENT_CLINIC_OR_DEPARTMENT_OTHER): Payer: Medicaid Other | Admitting: Physical Medicine & Rehabilitation

## 2014-12-29 ENCOUNTER — Encounter: Payer: Medicaid Other | Attending: Physical Medicine and Rehabilitation

## 2014-12-29 ENCOUNTER — Encounter: Payer: Self-pay | Admitting: Physical Medicine & Rehabilitation

## 2014-12-29 VITALS — BP 158/80 | HR 94 | Resp 16

## 2014-12-29 DIAGNOSIS — M7062 Trochanteric bursitis, left hip: Secondary | ICD-10-CM | POA: Diagnosis not present

## 2014-12-29 DIAGNOSIS — M546 Pain in thoracic spine: Secondary | ICD-10-CM

## 2014-12-29 DIAGNOSIS — M79604 Pain in right leg: Secondary | ICD-10-CM | POA: Insufficient documentation

## 2014-12-29 DIAGNOSIS — M47814 Spondylosis without myelopathy or radiculopathy, thoracic region: Secondary | ICD-10-CM | POA: Diagnosis not present

## 2014-12-29 DIAGNOSIS — E785 Hyperlipidemia, unspecified: Secondary | ICD-10-CM | POA: Insufficient documentation

## 2014-12-29 DIAGNOSIS — M47816 Spondylosis without myelopathy or radiculopathy, lumbar region: Secondary | ICD-10-CM | POA: Insufficient documentation

## 2014-12-29 DIAGNOSIS — K219 Gastro-esophageal reflux disease without esophagitis: Secondary | ICD-10-CM | POA: Insufficient documentation

## 2014-12-29 DIAGNOSIS — J449 Chronic obstructive pulmonary disease, unspecified: Secondary | ICD-10-CM | POA: Diagnosis not present

## 2014-12-29 DIAGNOSIS — M25551 Pain in right hip: Secondary | ICD-10-CM | POA: Insufficient documentation

## 2014-12-29 DIAGNOSIS — M810 Age-related osteoporosis without current pathological fracture: Secondary | ICD-10-CM | POA: Diagnosis not present

## 2014-12-29 DIAGNOSIS — M5416 Radiculopathy, lumbar region: Secondary | ICD-10-CM | POA: Insufficient documentation

## 2014-12-29 DIAGNOSIS — E119 Type 2 diabetes mellitus without complications: Secondary | ICD-10-CM | POA: Diagnosis not present

## 2014-12-29 DIAGNOSIS — M961 Postlaminectomy syndrome, not elsewhere classified: Secondary | ICD-10-CM | POA: Diagnosis not present

## 2014-12-29 DIAGNOSIS — E039 Hypothyroidism, unspecified: Secondary | ICD-10-CM | POA: Diagnosis not present

## 2014-12-29 DIAGNOSIS — F17210 Nicotine dependence, cigarettes, uncomplicated: Secondary | ICD-10-CM | POA: Diagnosis not present

## 2014-12-29 DIAGNOSIS — M25512 Pain in left shoulder: Secondary | ICD-10-CM | POA: Insufficient documentation

## 2014-12-29 DIAGNOSIS — G8929 Other chronic pain: Secondary | ICD-10-CM | POA: Insufficient documentation

## 2014-12-29 LAB — LIPID PANEL
CHOLESTEROL: 119 mg/dL (ref 0–200)
HDL: 17 mg/dL — AB (ref >=46)
Total CHOL/HDL Ratio: 7 Ratio
Triglycerides: 755 mg/dL — ABNORMAL HIGH (ref <150)

## 2014-12-29 LAB — MICROALBUMIN, URINE: Microalb, Ur: 3.6 mg/dL — ABNORMAL HIGH (ref <2.0)

## 2014-12-29 LAB — COMPLETE METABOLIC PANEL WITH GFR
ALBUMIN: 4 g/dL (ref 3.5–5.2)
ALK PHOS: 73 U/L (ref 39–117)
ALT: 24 U/L (ref 0–35)
AST: 19 U/L (ref 0–37)
BILIRUBIN TOTAL: 0.3 mg/dL (ref 0.2–1.2)
BUN: 17 mg/dL (ref 6–23)
CO2: 26 mEq/L (ref 19–32)
Calcium: 9 mg/dL (ref 8.4–10.5)
Chloride: 96 mEq/L (ref 96–112)
Creat: 0.84 mg/dL (ref 0.50–1.10)
GFR, EST NON AFRICAN AMERICAN: 76 mL/min
GFR, Est African American: 88 mL/min
Glucose, Bld: 172 mg/dL — ABNORMAL HIGH (ref 70–99)
Potassium: 4.4 mEq/L (ref 3.5–5.3)
SODIUM: 138 meq/L (ref 135–145)
TOTAL PROTEIN: 6.7 g/dL (ref 6.0–8.3)

## 2014-12-29 LAB — VITAMIN D 25 HYDROXY (VIT D DEFICIENCY, FRACTURES): VIT D 25 HYDROXY: 21 ng/mL — AB (ref 30–100)

## 2014-12-29 LAB — TSH: TSH: 7.472 u[IU]/mL — ABNORMAL HIGH (ref 0.350–4.500)

## 2014-12-29 MED ORDER — HYDROCODONE-ACETAMINOPHEN 10-325 MG PO TABS: 1.0000 | ORAL_TABLET | Freq: Three times a day (TID) | ORAL | Status: DC
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
## 2014-12-29 NOTE — Patient Instructions (Signed)
plan hip injection next month

## 2015-01-02 ENCOUNTER — Telehealth: Payer: Self-pay | Admitting: Family Medicine

## 2015-01-02 ENCOUNTER — Other Ambulatory Visit: Payer: Self-pay | Admitting: Family Medicine

## 2015-01-02 DIAGNOSIS — E039 Hypothyroidism, unspecified: Secondary | ICD-10-CM

## 2015-01-02 DIAGNOSIS — E559 Vitamin D deficiency, unspecified: Secondary | ICD-10-CM

## 2015-01-02 MED ORDER — LEVOTHYROXINE SODIUM 137 MCG PO TABS: 137.0000 ug | ORAL_TABLET | Freq: Every day | ORAL | Status: DC

## 2015-01-02 MED ORDER — CALCIUM CARBONATE-VITAMIN D 500-400 MG-UNIT PO TABS: 1.0000 | ORAL_TABLET | Freq: Two times a day (BID) | ORAL | Status: DC

## 2015-01-02 MED ORDER — SITAGLIPTIN PHOSPHATE 100 MG PO TABS: 100.0000 mg | ORAL_TABLET | Freq: Every day | ORAL | Status: DC

## 2015-01-02 MED ORDER — VITAMIN D 50 MCG (2000 UT) PO CAPS: 1.0000 | ORAL_CAPSULE | Freq: Every day | ORAL | Status: DC

## 2015-01-02 NOTE — Telephone Encounter (Signed)
Pt aware of lab results and provider recommendations. Told needs to do better with sugar control and diet.  Had stopped taking calcium supplements.  Told to restart 1200 to 1500 mg a day (in divided doses) and add Vitamin D in addition to that.  New Rx's to pharmacy and med list updated.  Future lab ordered.  Reinforced of three month follow ups until under better control. Appt made for lab work and three month follow up.

## 2015-01-02 NOTE — Telephone Encounter (Signed)
-----   Message from Orlena Sheldon, PA-C sent at 01/01/2015 12:06 PM EDT ----- FYI--WBC a little high---at OV Rxed Abx for Resp Infxn Regarding FLP---Pt is on Trilipix, Niaspan, Lipitor, Zetia. !! Not much more to do for this! Regarding A1C--On Metformin 1,000 BID. Add Januvia 100mg  1 po QD Regarding Thyroid--currently on 184mcg. Increase to 126mcg QD. Recheck TSH 6 weeks. -----Remove 125 dose. Add 137 dose. Order Future TSH. Vitamin D level is low. I see no Vit D on med list (except may be small amot in Calcium). Add OTC Vitmain D 2,000 IU QD--add to med list. Add Vitamin D Deficiency to Problem List.  Pt keeps waiting >10months  b /t OVs even though I keep telling her she needs to come more often. Tell her she REALLY needs to have f/u OV 3 months.

## 2015-01-03 NOTE — Telephone Encounter (Signed)
Agree 

## 2015-01-10 DIAGNOSIS — M79609 Pain in unspecified limb: Secondary | ICD-10-CM | POA: Diagnosis not present

## 2015-01-10 DIAGNOSIS — M13859 Other specified arthritis, unspecified hip: Secondary | ICD-10-CM | POA: Diagnosis not present

## 2015-01-10 DIAGNOSIS — J441 Chronic obstructive pulmonary disease with (acute) exacerbation: Secondary | ICD-10-CM | POA: Diagnosis not present

## 2015-01-10 DIAGNOSIS — M13869 Other specified arthritis, unspecified knee: Secondary | ICD-10-CM | POA: Diagnosis not present

## 2015-01-11 ENCOUNTER — Telehealth: Payer: Self-pay

## 2015-01-11 ENCOUNTER — Other Ambulatory Visit: Payer: Self-pay | Admitting: Gastroenterology

## 2015-01-11 ENCOUNTER — Telehealth: Payer: Self-pay | Admitting: Family Medicine

## 2015-01-11 NOTE — Telephone Encounter (Signed)
Pt called and states that she needs to cancel procedures because she has company coming in next week and she doesn't want to be doing her prep and everything while she has company in the house. Pt scheduled her an office visit.

## 2015-01-11 NOTE — Telephone Encounter (Addendum)
New meds ordered for pt on 4/26 to the [facility].  Pt calling today because has not received yet.  I called [facility] and they did have new orders but didn't know why it had not been processed.  Will definitely call pt today and have meds delivered.  I called pt back and told her this.  Told her to call me back if this was not done.

## 2015-01-15 ENCOUNTER — Other Ambulatory Visit (HOSPITAL_COMMUNITY): Payer: Medicaid Other

## 2015-01-18 ENCOUNTER — Ambulatory Visit (HOSPITAL_COMMUNITY): Admission: RE | Admit: 2015-01-18 | Payer: Medicaid Other | Source: Ambulatory Visit | Admitting: Internal Medicine

## 2015-01-18 ENCOUNTER — Other Ambulatory Visit: Payer: Self-pay | Admitting: Family Medicine

## 2015-01-18 ENCOUNTER — Other Ambulatory Visit: Payer: Self-pay | Admitting: Nurse Practitioner

## 2015-01-18 ENCOUNTER — Encounter (HOSPITAL_COMMUNITY): Admission: RE | Payer: Self-pay | Source: Ambulatory Visit

## 2015-01-18 SURGERY — COLONOSCOPY WITH PROPOFOL
Anesthesia: Monitor Anesthesia Care

## 2015-01-25 ENCOUNTER — Other Ambulatory Visit: Payer: Self-pay

## 2015-01-25 ENCOUNTER — Encounter: Payer: Self-pay | Admitting: Gastroenterology

## 2015-01-25 ENCOUNTER — Ambulatory Visit (INDEPENDENT_AMBULATORY_CARE_PROVIDER_SITE_OTHER): Payer: Medicaid Other | Admitting: Gastroenterology

## 2015-01-25 VITALS — BP 123/69 | HR 109 | Temp 98.1°F | Ht 65.5 in

## 2015-01-25 DIAGNOSIS — R1314 Dysphagia, pharyngoesophageal phase: Secondary | ICD-10-CM | POA: Diagnosis not present

## 2015-01-25 DIAGNOSIS — R1013 Epigastric pain: Secondary | ICD-10-CM

## 2015-01-25 DIAGNOSIS — Z860100 Personal history of colon polyps, unspecified: Secondary | ICD-10-CM

## 2015-01-25 DIAGNOSIS — K219 Gastro-esophageal reflux disease without esophagitis: Secondary | ICD-10-CM

## 2015-01-25 NOTE — Patient Instructions (Signed)
You have been scheduled for a colonoscopy, upper endoscopy, and possible dilation with Dr. Gala Romney in the near future.  Further recommendations to follow.
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
## 2015-01-29 NOTE — Assessment & Plan Note (Signed)
59 year old female with history of adenomas in the remote past, multiple hyperplastic polyps on colonoscopy in 2011, due now for surveillance colonoscopy. Alternating constipation and diarrhea chronic in nature, with small volume hematochezia likely benign anorectal source.   Proceed with TCS with Dr. Gala Romney in near future: the risks, benefits, and alternatives have been discussed with the patient in detail. The patient states understanding and desires to proceed. PROPOFOL due to polypharmacy Levsin for diarrhea, Miralax for constipation

## 2015-01-29 NOTE — Assessment & Plan Note (Addendum)
Chronic abdominal pain, no significant improvement with Dexilant. Remains on Protonix now. Solid food dysphagia chronic. In setting of Ibuprofen use could have NSAID gastritis, PUD, doubt biliary etiology and strongly question chronic abdominal pain with psychologic overlay.   Proceed with upper endoscopy and dilation in the near future with Dr. Gala Romney. The risks, benefits, and alternatives have been discussed in detail with patient. They have stated understanding and desire to proceed.  PROPOFOL Continue Protonix now Consider updating [place] +/- HIDA if EGD negative

## 2015-01-29 NOTE — Assessment & Plan Note (Signed)
Query uncontrolled GERD, web, ring, stricture, motility disorder. EGD/ED as planned. May ultimately need BPE.

## 2015-01-29 NOTE — Assessment & Plan Note (Addendum)
No improvement with Dexilant. Continue Protonix for now. Tried/failed Prilosec and Nexium in past. Reportedly history of Barrett's at outside facility in the remote past, with most recent path negative in 2011.

## 2015-01-30 NOTE — Progress Notes (Signed)
cc'ed to pcp °

## 2015-02-01 ENCOUNTER — Ambulatory Visit (HOSPITAL_BASED_OUTPATIENT_CLINIC_OR_DEPARTMENT_OTHER): Payer: Medicaid Other | Admitting: Physical Medicine & Rehabilitation

## 2015-02-01 ENCOUNTER — Other Ambulatory Visit: Payer: Self-pay | Admitting: Physical Medicine & Rehabilitation

## 2015-02-01 ENCOUNTER — Encounter: Payer: Self-pay | Admitting: Physical Medicine & Rehabilitation

## 2015-02-01 ENCOUNTER — Encounter: Payer: Medicaid Other | Attending: Physical Medicine and Rehabilitation

## 2015-02-01 VITALS — BP 130/83 | HR 100 | Resp 14

## 2015-02-01 DIAGNOSIS — E785 Hyperlipidemia, unspecified: Secondary | ICD-10-CM | POA: Diagnosis not present

## 2015-02-01 DIAGNOSIS — E119 Type 2 diabetes mellitus without complications: Secondary | ICD-10-CM | POA: Insufficient documentation

## 2015-02-01 DIAGNOSIS — M47814 Spondylosis without myelopathy or radiculopathy, thoracic region: Secondary | ICD-10-CM | POA: Diagnosis not present

## 2015-02-01 DIAGNOSIS — J449 Chronic obstructive pulmonary disease, unspecified: Secondary | ICD-10-CM | POA: Diagnosis not present

## 2015-02-01 DIAGNOSIS — M25512 Pain in left shoulder: Secondary | ICD-10-CM | POA: Insufficient documentation

## 2015-02-01 DIAGNOSIS — Z79899 Other long term (current) drug therapy: Secondary | ICD-10-CM

## 2015-02-01 DIAGNOSIS — M25551 Pain in right hip: Secondary | ICD-10-CM | POA: Diagnosis not present

## 2015-02-01 DIAGNOSIS — G8929 Other chronic pain: Secondary | ICD-10-CM | POA: Insufficient documentation

## 2015-02-01 DIAGNOSIS — M7062 Trochanteric bursitis, left hip: Secondary | ICD-10-CM

## 2015-02-01 DIAGNOSIS — K219 Gastro-esophageal reflux disease without esophagitis: Secondary | ICD-10-CM | POA: Insufficient documentation

## 2015-02-01 DIAGNOSIS — M810 Age-related osteoporosis without current pathological fracture: Secondary | ICD-10-CM | POA: Insufficient documentation

## 2015-02-01 DIAGNOSIS — M47816 Spondylosis without myelopathy or radiculopathy, lumbar region: Secondary | ICD-10-CM | POA: Diagnosis not present

## 2015-02-01 DIAGNOSIS — F17210 Nicotine dependence, cigarettes, uncomplicated: Secondary | ICD-10-CM | POA: Diagnosis not present

## 2015-02-01 DIAGNOSIS — E039 Hypothyroidism, unspecified: Secondary | ICD-10-CM | POA: Diagnosis not present

## 2015-02-01 DIAGNOSIS — M79604 Pain in right leg: Secondary | ICD-10-CM | POA: Diagnosis not present

## 2015-02-01 DIAGNOSIS — Z5181 Encounter for therapeutic drug level monitoring: Secondary | ICD-10-CM | POA: Diagnosis not present

## 2015-02-01 MED ORDER — HYDROCODONE-ACETAMINOPHEN 10-325 MG PO TABS: 1.0000 | ORAL_TABLET | Freq: Three times a day (TID) | ORAL | Status: DC

## 2015-02-01 NOTE — Addendum Note (Signed)
Addended by: Valeria Batman on: 02/01/2015 11:56 AM   Modules accepted: Orders

## 2015-02-01 NOTE — Patient Instructions (Addendum)
Joint Injection Care After Refer to this sheet in the next few days. These instructions provide you with information on caring for yourself after you have had a joint injection. Your caregiver also may give you more specific instructions. Your treatment has been planned according to current medical practices, but problems sometimes occur. Call your caregiver if you have any problems or questions after your procedure. After any type of joint injection, it is not uncommon to experience:  Soreness, swelling, or bruising around the injection site.  Mild numbness, tingling, or weakness around the injection site caused by the numbing medicine used before or with the injection. It also is possible to experience the following effects associated with the specific agent after injection:  Iodine-based contrast agents:  Allergic reaction (itching, hives, widespread redness, and swelling beyond the injection site).  Corticosteroids (These effects are rare.):  Allergic reaction.  Increased blood sugar levels (If you have diabetes and you notice that your blood sugar levels have increased, notify your caregiver).  Increased blood pressure levels.  Mood swings.  Hyaluronic acid in the use of viscosupplementation.  Temporary heat or redness.  Temporary rash and itching.  Increased fluid accumulation in the injected joint. These effects all should resolve within a day after your procedure.  HOME CARE INSTRUCTIONS  Limit yourself to light activity the day of your procedure. Avoid lifting heavy objects, bending, stooping, or twisting.  Take prescription or over-the-counter pain medication as directed by your caregiver.  You may apply ice to your injection site to reduce pain and swelling the day of your procedure. Ice may be applied 03-04 times:  Put ice in a plastic bag.  Place a towel between your skin and the bag.  Leave the ice on for no longer than 15-20 minutes each time. SEEK  IMMEDIATE MEDICAL CARE IF:   Pain and swelling get worse rather than better or extend beyond the injection site.  Numbness does not go away.  Blood or fluid continues to leak from the injection site.  You have chest pain.  You have swelling of your face or tongue.  You have trouble breathing or you become dizzy.  You develop a fever, chills, or severe tenderness at the injection site that last longer than 1 day. MAKE SURE YOU:  Understand these instructions.  Watch your condition.  Get help right away if you are not doing well or if you get worse. Document Released: 05/08/2011 Document Revised: 11/17/2011 Document Reviewed: 05/08/2011 [facility] Patient Information 2015 [place], [place]. This information is not intended to replace advice given to you by your health care provider. Make sure you discuss any questions you have with your health care provider.    Please continue to take her normal pain medications postoperatively but heavier surgeon give whatever extra medicines to you for the first week or 2 after surgery

## 2015-02-01 NOTE — Progress Notes (Signed)
LEFT Trochanteric bursa injection With  ultrasound guidance  Indication Trochanteric bursitis. Exam has tenderness over the greater trochanter of the hip. Pain has not responded to conservative care such as exercise therapy and oral medications. Pain interferes with sleep or with mobility, previous good relief with ultrasound guided trochanteric bursa injection. Patient is morbidly obese Informed consent was obtained after describing risks and benefits of the procedure with the patient these include bleeding bruising and infection. Patient has signed written consent form. Patient placed in a lateral decubitus position with the affected hip superior. Point of maximal pain was palpated marked and prepped with Betadine and entered with a 25-gauge 1.5 inch needleneedle , 1% lidocaine was infiltrated .Then a 50 mm 22-gauge echo block needle was inserted under direct ultrasound visualization. Bone contact was made. Needle slightly withdrawn then 6mg  of betamethasone with 4 cc 1% lidocaine were injected. Patient tolerated procedure well. Post procedure instructions given.

## 2015-02-02 ENCOUNTER — Other Ambulatory Visit: Payer: Self-pay | Admitting: Orthopedic Surgery

## 2015-02-02 LAB — PMP ALCOHOL METABOLITE (ETG): ETGU: NEGATIVE ng/mL

## 2015-02-06 LAB — OPIATES/OPIOIDS (LC/MS-MS)
Codeine Urine: NEGATIVE ng/mL (ref <50)
HYDROCODONE: 1015 ng/mL (ref <50)
Hydromorphone: 285 ng/mL (ref <50)
Morphine Urine: NEGATIVE ng/mL (ref <50)
NORHYDROCODONE, UR: 573 ng/mL (ref <50)
Noroxycodone, Ur: NEGATIVE ng/mL (ref <50)
OXYCODONE, UR: NEGATIVE ng/mL (ref <50)
Oxymorphone: NEGATIVE ng/mL (ref <50)

## 2015-02-06 LAB — MDMA (ECSTASY), URINE
MDA GC/MS confirm: NEGATIVE ng/mL (ref <200)
MDMA GC/MS Conf: NEGATIVE ng/mL (ref <200)

## 2015-02-07 ENCOUNTER — Ambulatory Visit (INDEPENDENT_AMBULATORY_CARE_PROVIDER_SITE_OTHER): Payer: Medicaid Other | Admitting: Family Medicine

## 2015-02-07 ENCOUNTER — Encounter (HOSPITAL_COMMUNITY)
Admission: RE | Admit: 2015-02-07 | Discharge: 2015-02-07 | Disposition: A | Discharge status: Home / Self Care | Payer: Medicaid Other | Source: Ambulatory Visit | Attending: Internal Medicine | Admitting: Internal Medicine

## 2015-02-07 ENCOUNTER — Encounter: Payer: Self-pay | Admitting: Family Medicine

## 2015-02-07 ENCOUNTER — Encounter (HOSPITAL_COMMUNITY): Payer: Self-pay

## 2015-02-07 VITALS — BP 136/72 | HR 78 | Temp 98.9°F | Resp 18

## 2015-02-07 DIAGNOSIS — R1310 Dysphagia, unspecified: Secondary | ICD-10-CM | POA: Diagnosis not present

## 2015-02-07 DIAGNOSIS — N3000 Acute cystitis without hematuria: Secondary | ICD-10-CM | POA: Diagnosis not present

## 2015-02-07 DIAGNOSIS — Z01818 Encounter for other preprocedural examination: Secondary | ICD-10-CM | POA: Insufficient documentation

## 2015-02-07 DIAGNOSIS — B370 Candidal stomatitis: Secondary | ICD-10-CM | POA: Diagnosis not present

## 2015-02-07 DIAGNOSIS — K449 Diaphragmatic hernia without obstruction or gangrene: Secondary | ICD-10-CM | POA: Insufficient documentation

## 2015-02-07 HISTORY — DX: Other amnesia: R41.3

## 2015-02-07 HISTORY — DX: Dislocation of jaw, unspecified side, initial encounter: S03.00XA

## 2015-02-07 LAB — PRESCRIPTION MONITORING PROFILE (SOLSTAS)
Amphetamine/Meth: NEGATIVE ng/mL
Barbiturate Screen, Urine: NEGATIVE ng/mL
Benzodiazepine Screen, Urine: NEGATIVE ng/mL
Buprenorphine, Urine: NEGATIVE ng/mL
CARISOPRODOL, URINE: NEGATIVE ng/mL
Cannabinoid Scrn, Ur: NEGATIVE ng/mL
Cocaine Metabolites: NEGATIVE ng/mL
Creatinine, Urine: 91.24 mg/dL (ref >20.0)
Fentanyl, Ur: NEGATIVE ng/mL
Meperidine, Ur: NEGATIVE ng/mL
Methadone Screen, Urine: NEGATIVE ng/mL
NITRITES URINE, INITIAL: NEGATIVE ug/mL
Oxycodone Screen, Ur: NEGATIVE ng/mL
Propoxyphene: NEGATIVE ng/mL
TRAMADOL UR: NEGATIVE ng/mL
Tapentadol, urine: NEGATIVE ng/mL
ZOLPIDEM, URINE: NEGATIVE ng/mL
pH, Initial: 6.9 pH (ref 4.5–8.9)

## 2015-02-07 LAB — BASIC METABOLIC PANEL
Anion gap: 10 (ref 5–15)
BUN: 18 mg/dL (ref 6–20)
CHLORIDE: 97 mmol/L — AB (ref 101–111)
CO2: 31 mmol/L (ref 22–32)
CREATININE: 0.95 mg/dL (ref 0.44–1.00)
Calcium: 9.4 mg/dL (ref 8.9–10.3)
GFR calc Af Amer: >60 mL/min (ref >60)
GFR calc non Af Amer: >60 mL/min (ref >60)
Glucose, Bld: 169 mg/dL — ABNORMAL HIGH (ref 65–99)
Potassium: 4.8 mmol/L (ref 3.5–5.1)
SODIUM: 138 mmol/L (ref 135–145)

## 2015-02-07 LAB — URINALYSIS, MICROSCOPIC ONLY
CASTS: NONE SEEN
CRYSTALS: NONE SEEN

## 2015-02-07 LAB — URINALYSIS, ROUTINE W REFLEX MICROSCOPIC
Bilirubin Urine: NEGATIVE
GLUCOSE, UA: 100 mg/dL — AB
Ketones, ur: NEGATIVE mg/dL
NITRITE: POSITIVE — AB
PROTEIN: 30 mg/dL — AB
Specific Gravity, Urine: 1.02 (ref 1.005–1.030)
UROBILINOGEN UA: 0.2 mg/dL (ref 0.0–1.0)
pH: 7 (ref 5.0–8.0)

## 2015-02-07 LAB — CBC WITH DIFFERENTIAL/PLATELET
Basophils Absolute: 0.1 10*3/uL (ref 0.0–0.1)
Basophils Relative: 0 % (ref 0–1)
EOS ABS: 0.2 10*3/uL (ref 0.0–0.7)
EOS PCT: 1 % (ref 0–5)
HCT: 47.3 % — ABNORMAL HIGH (ref 36.0–46.0)
Hemoglobin: 15.1 g/dL — ABNORMAL HIGH (ref 12.0–15.0)
LYMPHS ABS: 5.4 10*3/uL — AB (ref 0.7–4.0)
Lymphocytes Relative: 27 % (ref 12–46)
MCH: 27.8 pg (ref 26.0–34.0)
MCHC: 31.9 g/dL (ref 30.0–36.0)
MCV: 86.9 fL (ref 78.0–100.0)
MONO ABS: 1.1 10*3/uL — AB (ref 0.1–1.0)
Monocytes Relative: 5 % (ref 3–12)
Neutro Abs: 13.1 10*3/uL — ABNORMAL HIGH (ref 1.7–7.7)
Neutrophils Relative %: 67 % (ref 43–77)
Platelets: 296 10*3/uL (ref 150–400)
RBC: 5.44 MIL/uL — AB (ref 3.87–5.11)
RDW: 16.1 % — ABNORMAL HIGH (ref 11.5–15.5)
WBC: 19.8 10*3/uL — ABNORMAL HIGH (ref 4.0–10.5)

## 2015-02-07 MED ORDER — CIPROFLOXACIN HCL 500 MG PO TABS: 500.0000 mg | ORAL_TABLET | Freq: Two times a day (BID) | ORAL | Status: DC

## 2015-02-07 MED ORDER — NYSTATIN 100000 UNIT/ML MT SUSP: 5.0000 mL | Freq: Four times a day (QID) | OROMUCOSAL | Status: DC

## 2015-02-07 NOTE — Patient Instructions (Signed)
Lisa Ewing  02/07/2015     @PREFPERIOPPHARMACY @   Your procedure is scheduled on 02/12/2015  Report to [facility] at  615 A.M.  Call this number if you have problems the morning of surgery:  [phone]   Remember:  Do not eat food or drink liquids after midnight.  Take these medicines the morning of surgery with A SIP OF WATER  Evista, vesicare, protonix, zovirax, lexapro, zyrtec, neurontin, hydrocodone, levothyroxine, singulair, robaxin. Take your inhalers and your nebulizer before you come.   Do not wear jewelry, make-up or nail polish.  Do not wear lotions, powders, or perfumes.    Do not shave 48 hours prior to surgery.  Men may shave face and neck.  Do not bring valuables to the hospital.  [facility] is not responsible for any belongings or valuables.  Contacts, dentures or bridgework may not be worn into surgery.  Leave your suitcase in the car.  After surgery it may be brought to your room.  For patients admitted to the hospital, discharge time will be determined by your treatment team.  Patients discharged the day of surgery will not be allowed to drive home.   Name and phone number of your driver:   family Special instructions:  Follow instructions given to you by Dr Roseanne Kaufman office.  Please read over the following fact sheets that you were given. Pain Booklet, Coughing and Deep Breathing, Surgical Site Infection Prevention, Anesthesia Post-op Instructions and Care and Recovery After Surgery      Esophageal Dilatation The esophagus is the long, narrow tube which carries food and liquid from the mouth to the stomach. Esophageal dilatation is the technique used to stretch a blocked or narrowed portion of the esophagus. This procedure is used when a part of the esophagus has become so narrow that it becomes difficult, painful or even impossible to swallow. This is generally an uncomplicated form of treatment. When this is not successful, chest surgery may  be required. This is a much more extensive form of treatment with a longer recovery time. CAUSES  Some of the more common causes of blockage or strictures of the esophagus are:  Narrowing from longstanding inflammation (soreness and redness) of the lower esophagus. This comes from the constant exposure of the lower esophagus to the acid which bubbles up from the stomach. Over time this causes scarring and narrowing of the lower esophagus.  Hiatal hernia in which a small part of the stomach bulges (herniates) up through the diaphragm. This can cause a gradual narrowing of the end of the esophagus.  Schatzki ring is a narrow ring of benign (non-cancerous) fibrous tissue which constricts the lower esophagus. The reason for this is not known.  Scleroderma is a connective tissue disorder that affects the esophagus and makes swallowing difficult.  Achalasia is an absence of nerves to the lower esophagus and to the esophageal sphincter. This is the circular muscle between the stomach and esophagus that relaxes to allow food into the stomach. After swallowing, it contracts to keep food in the stomach. This absence of nerves may be congenital (present since birth). This can cause irregular spasms of the lower esophageal muscle. This spasm does not open up to allow food and fluid through. The result is a persistent blockage with subsequent slow trickling of the esophageal contents into the stomach.  Strictures may develop from swallowing materials which damage the esophagus. Some examples are strong acids or alkalis such  as lye.  Growths such as benign (non-cancerous) and malignant (cancerous) tumors can block the esophagus.  Hereditary (present since birth) causes. DIAGNOSIS  Your caregiver often suspects this problem by taking a medical history. They will also do a physical exam. They can then prove their suspicions using X-rays and endoscopy. Endoscopy is an exam in which a tube like a small, flexible  telescope is used to look at your esophagus.  TREATMENT There are different stretching (dilating) techniques that can be used. Simple bougie dilatation may be done in the office. This usually takes only a couple minutes. A numbing (anesthetic) spray of the throat is used. Endoscopy, when done, is done in an endoscopy suite under mild sedation. When fluoroscopy is used, the procedure is performed in X-ray. Other techniques require a little longer time. Recovery is usually quick. There is no waiting time to begin eating and drinking to test success of the treatment. Following are some of the methods used. Narrowing of the esophagus is treated by making it bigger. Commonly this is a mechanical problem which can be treated with stretching. This can be done in different ways. Your caregiver will discuss these with you. Some of the means used are:  A series of graduated (increasing thickness) flexible dilators can be used. These are weighted tubes passed through the esophagus into the stomach. The tubes used become progressively larger until the desired stretched size is reached. Graduated dilators are a simple and quick way of opening the esophagus. No visualization is required.  Another method is the use of endoscopy to place a flexible wire across the stricture. The endoscope is removed and the wire left in place. A dilator with a hole through it from end to end is guided down the esophagus and across the stricture. One or more of these dilators are passed over the wire. At the end of the exam, the wire is removed. This type of treatment may be performed in the X-ray department under fluoroscopy. An advantage of this procedure is the examiner is visualizing the end opening in the esophagus.  Stretching of the esophagus may be done using balloons. Deflated balloons are placed through the endoscope and across the stricture. This type of balloon dilatation is often done at the time of endoscopy or fluoroscopy.  Flexible endoscopy allows the examiner to directly view the stricture. A balloon is inserted in the deflated form into the area of narrowing. It is then inflated with air to a certain pressure that is preset for a given circumference. When inflated, it becomes sausage shaped, stretched, and makes the stricture larger.  Achalasia requires a longer, larger balloon-type dilator. This is frequently done under X-ray control. In this situation, the spastic muscle fibers in the lower esophagus are stretched. All of the above procedures make the passage of food and water into the stomach easier. They also make it easier for stomach contents to reflux back into the esophagus. Special medications may be used following the procedure to help prevent further stricturing. Proton-pump inhibitor medications are good at decreasing the amount of acid in the stomach juice. When stomach juice refluxes into the esophagus, the juice is no longer as acidic and is less likely to burn or scar the esophagus. RISKS AND COMPLICATIONS Esophageal dilatation is usually performed effectively and without problems. Some complications that can occur are:  A small amount of bleeding almost always happens where the stretching takes place. If this is too excessive it may require more aggressive treatment.  An  uncommon complication is perforation (making a hole) of the esophagus. The esophagus is thin. It is easy to make a hole in it. If this happens, an operation may be necessary to repair this.  A small, undetected perforation could lead to an infection in the chest. This can be very serious. HOME CARE INSTRUCTIONS   If you received sedation for your procedure, do not drive, make important decisions, or perform any activities requiring your full coordination. Do not drink alcohol, take sedatives, or use any mind altering chemicals unless instructed by your caregiver.  You may use throat lozenges or warm salt water gargles if you have  throat discomfort.  You can begin eating and drinking normally on return home unless instructed otherwise. Do not purposely try to force large chunks of food down to test the benefits of your procedure.  Mild discomfort can be eased with sips of ice water.  Medications for discomfort may or may not be needed. SEEK IMMEDIATE MEDICAL CARE IF:   You begin vomiting up blood.  You develop black, tarry stools.  You develop chills or an unexplained temperature of over 101F (38.3C)  You develop chest or abdominal pain.  You develop shortness of breath, or feel light-headed or faint.  Your swallowing is becoming more painful, difficult, or you are unable to swallow. MAKE SURE YOU:   Understand these instructions.  Will watch your condition.  Will get help right away if you are not doing well or get worse. Document Released: 10/16/2005 Document Revised: 01/09/2014 Document Reviewed: 12/03/2005 [facility] Patient Information 2015 [place], [place]. This information is not intended to replace advice given to you by your health care provider. Make sure you discuss any questions you have with your health care provider. Esophagogastroduodenoscopy Esophagogastroduodenoscopy (EGD) is a procedure to examine the lining of the esophagus, stomach, and first part of the small intestine (duodenum). A long, flexible, lighted tube with a camera attached (endoscope) is inserted down the throat to view these organs. This procedure is done to detect problems or abnormalities, such as inflammation, bleeding, ulcers, or growths, in order to treat them. The procedure lasts about 5-20 minutes. It is usually an outpatient procedure, but it may need to be performed in emergency cases in the hospital. LET YOUR CAREGIVER KNOW ABOUT:   Allergies to food or medicine.  All medicines you are taking, including vitamins, herbs, eyedrops, and over-the-counter medicines and creams.  Use of steroids (by mouth or  creams).  Previous problems you or members of your family have had with the use of anesthetics.  Any blood disorders you have.  Previous surgeries you have had.  Other health problems you have.  Possibility of pregnancy, if this applies. RISKS AND COMPLICATIONS  Generally, EGD is a safe procedure. However, as with any procedure, complications can occur. Possible complications include:  Infection.  Bleeding.  Tearing (perforation) of the esophagus, stomach, or duodenum.  Difficulty breathing or not being able to breath.  Excessive sweating.  Spasms of the larynx.  Slowed heartbeat.  Low blood pressure. BEFORE THE PROCEDURE  Do not eat or drink anything for 6-8 hours before the procedure or as directed by your caregiver.  Ask your caregiver about changing or stopping your regular medicines.  If you wear dentures, be prepared to remove them before the procedure.  Arrange for someone to drive you home after the procedure. PROCEDURE   A vein will be accessed to give medicines and fluids. A medicine to relax you (sedative) and a pain  reliever will be given through that access into the vein.  A numbing medicine (local anesthetic) may be sprayed on your throat for comfort and to stop you from gagging or coughing.  A mouth guard may be placed in your mouth to protect your teeth and to keep you from biting on the endoscope.  You will be asked to lie on your left side.  The endoscope is inserted down your throat and into the esophagus, stomach, and duodenum.  Air is put through the endoscope to allow your caregiver to view the lining of your esophagus clearly.  The esophagus, stomach, and duodenum is then examined. During the exam, your caregiver may:  Remove tissue to be examined under a microscope (biopsy) for inflammation, infection, or other medical problems.  Remove growths.  Remove objects (foreign bodies) that are stuck.  Treat any bleeding with medicines or  other devices that stop tissues from bleeding (hot cautery, clipping devices).  Widen (dilate) or stretch narrowed areas of the esophagus and stomach.  The endoscope will then be withdrawn. AFTER THE PROCEDURE  You will be taken to a recovery area to be monitored. You will be able to go home once you are stable and alert.  Do not eat or drink anything until the local anesthetic and numbing medicines have worn off. You may choke.  It is normal to feel bloated, have pain with swallowing, or have a sore throat for a short time. This will wear off.  Your caregiver should be able to discuss his or her findings with you. It will take longer to discuss the test results if any biopsies were taken. Document Released: 12/26/2004 Document Revised: 01/09/2014 Document Reviewed: 07/28/2012 [facility] Patient Information 2015 [place], [place]. This information is not intended to replace advice given to you by your health care provider. Make sure you discuss any questions you have with your health care provider. Colonoscopy A colonoscopy is an exam to look at the entire large intestine (colon). This exam can help find problems such as tumors, polyps, inflammation, and areas of bleeding. The exam takes about 1 hour.  LET [facility] CARE PROVIDER KNOW ABOUT:   Any allergies you have.  All medicines you are taking, including vitamins, herbs, eye drops, creams, and over-the-counter medicines.  Previous problems you or members of your family have had with the use of anesthetics.  Any blood disorders you have.  Previous surgeries you have had.  Medical conditions you have. RISKS AND COMPLICATIONS  Generally, this is a safe procedure. However, as with any procedure, complications can occur. Possible complications include:  Bleeding.  Tearing or rupture of the colon wall.  Reaction to medicines given during the exam.  Infection (rare). BEFORE THE PROCEDURE   Ask your health care provider about  changing or stopping your regular medicines.  You may be prescribed an oral bowel prep. This involves drinking a large amount of medicated liquid, starting the day before your procedure. The liquid will cause you to have multiple loose stools until your stool is almost clear or light green. This cleans out your colon in preparation for the procedure.  Do not eat or drink anything else once you have started the bowel prep, unless your health care provider tells you it is safe to do so.  Arrange for someone to drive you home after the procedure. PROCEDURE   You will be given medicine to help you relax (sedative).  You will lie on your side with your knees bent.  A  long, flexible tube with a light and camera on the end (colonoscope) will be inserted through the rectum and into the colon. The camera sends video back to a computer screen as it moves through the colon. The colonoscope also releases carbon dioxide gas to inflate the colon. This helps your health care provider see the area better.  During the exam, your health care provider may take a small tissue sample (biopsy) to be examined under a microscope if any abnormalities are found.  The exam is finished when the entire colon has been viewed. AFTER THE PROCEDURE   Do not drive for 24 hours after the exam.  You may have a small amount of blood in your stool.  You may pass moderate amounts of gas and have mild abdominal cramping or bloating. This is caused by the gas used to inflate your colon during the exam.  Ask when your test results will be ready and how you will get your results. Make sure you get your test results. Document Released: 08/22/2000 Document Revised: 06/15/2013 Document Reviewed: 05/02/2013 [facility] Patient Information 2015 [place], [place]. This information is not intended to replace advice given to you by your health care provider. Make sure you discuss any questions you have with your health care provider. PATIENT  INSTRUCTIONS POST-ANESTHESIA  IMMEDIATELY FOLLOWING SURGERY:  Do not drive or operate machinery for the first twenty four hours after surgery.  Do not make any important decisions for twenty four hours after surgery or while taking narcotic pain medications or sedatives.  If you develop intractable nausea and vomiting or a severe headache please notify your doctor immediately.  FOLLOW-UP:  Please make an appointment with your surgeon as instructed. You do not need to follow up with anesthesia unless specifically instructed to do so.  WOUND CARE INSTRUCTIONS (if applicable):  Keep a dry clean dressing on the anesthesia/puncture wound site if there is drainage.  Once the wound has quit draining you may leave it open to air.  Generally you should leave the bandage intact for twenty four hours unless there is drainage.  If the epidural site drains for more than 36-48 hours please call the anesthesia department.  QUESTIONS?:  Please feel free to call your physician or the hospital operator if you have any questions, and they will be happy to assist you.

## 2015-02-07 NOTE — Patient Instructions (Signed)
Take antibiotics as prescribed Take nystatin for thrush F/U as needed

## 2015-02-08 ENCOUNTER — Encounter: Payer: Self-pay | Admitting: Family Medicine

## 2015-02-08 NOTE — Progress Notes (Signed)
Patient ID: Lisa Ewing, female   DOB: 1956-06-01, 59 y.o.   MRN: [id number]   Subjective:    Patient ID: Lisa Ewing, female    DOB: 10/28/1955, 59 y.o.   MRN: [id number]  Patient presents for Dysuria and Thrush  Pt here with dysuria for the past week, tried cranberry juice and AZO, feels like her typical UTI. Also has strong odor to urine and severe burning when she first starts urinating. Denies abdominal pain or cramping. She has also noted thrush over the past few days, which she gets on and off. She takes > 10 medications and sees multiple specialist. She is scheduled for EGD/Colonoscopy next Tuesday.    Review Of Systems:  GEN- denies fatigue, fever, weight loss,weakness, recent illness HEENT- denies eye drainage, change in vision, nasal discharge, CVS- denies chest pain, palpitations RESP- denies SOB, cough, wheeze ABD- denies N/V, change in stools, abd pain GU- +dysuria, hematuria, dribbling, incontinence MSK-+ joint pain, muscle aches, injury Neuro- denies headache, dizziness, syncope, seizure activity       Objective:    BP 136/72 mmHg  Pulse 78  Temp(Src) 98.9 F (37.2 C) (Oral)  Resp 18 GEN- NAD, alert and oriented x3,sitting in wheelchair HEENT- PERRL,EOMI, non icteric, white film across tongue, no blisters, no ulcers, post oropharynx clear  CVS- RRR, no murmur RESP-CTAB ABD-NABS,soft,TTP suprapubic region, no rebound, no gaurding, no CVA tenderness,ND EXT- No edema Pulses- Radial  2+        Assessment & Plan:      Problem List Items Addressed This Visit    Urinary tract infection - Primary    Cipro x 7 days, culture sent   Relevant Medications   nystatin (MYCOSTATIN) 100000 UNIT/ML suspension   Other Relevant Orders   Urinalysis, Routine w reflex microscopic (not at [facility]) (Completed)   Urine culture    Other Visit Diagnoses    Thrush        nystatin swish and swallow    Relevant Medications    nystatin (MYCOSTATIN) 100000 UNIT/ML suspension        Note: This dictation was prepared with Dragon dictation along with smaller phrase technology. Any transcriptional errors that result from this process are unintentional.

## 2015-02-09 ENCOUNTER — Other Ambulatory Visit: Payer: Self-pay

## 2015-02-09 DIAGNOSIS — D72829 Elevated white blood cell count, unspecified: Secondary | ICD-10-CM

## 2015-02-09 LAB — CBC
HEMATOCRIT: 42.8 % (ref 36.0–46.0)
HEMOGLOBIN: 13.8 g/dL (ref 12.0–15.0)
MCH: 27.2 pg (ref 26.0–34.0)
MCHC: 32.2 g/dL (ref 30.0–36.0)
MCV: 84.3 fL (ref 78.0–100.0)
MPV: 9.6 fL (ref 8.6–12.4)
Platelets: 320 10*3/uL (ref 150–400)
RBC: 5.08 MIL/uL (ref 3.87–5.11)
RDW: 15.5 % (ref 11.5–15.5)
WBC: 13.6 10*3/uL — AB (ref 4.0–10.5)

## 2015-02-09 NOTE — Progress Notes (Signed)
Quick Note:  Addendum: Procedure may need to be postponed. Let's get a stat CBC today. Make sure it is trending down. ______

## 2015-02-09 NOTE — Progress Notes (Signed)
Quick Note:  Patient has a UTI.  Procedure is 6/6.  Can we have her complete a stat CBC the morning of the procedure?   ______

## 2015-02-09 NOTE — OR Nursing (Signed)
Per Laban Emperor WBC's come down to 13.6,  Go ahead and proceed with procedure.

## 2015-02-10 LAB — URINE CULTURE: Colony Count: >=100000

## 2015-02-12 ENCOUNTER — Ambulatory Visit (HOSPITAL_COMMUNITY): Payer: Medicaid Other | Admitting: Anesthesiology

## 2015-02-12 ENCOUNTER — Encounter (HOSPITAL_COMMUNITY)
Admission: RE | Disposition: A | Discharge status: Home / Self Care | Payer: Self-pay | Source: Ambulatory Visit | Attending: Internal Medicine

## 2015-02-12 ENCOUNTER — Ambulatory Visit (HOSPITAL_COMMUNITY)
Admission: RE | Admit: 2015-02-12 | Discharge: 2015-02-12 | Disposition: A | Discharge status: Home / Self Care | Payer: Medicaid Other | Source: Ambulatory Visit | Attending: Internal Medicine | Admitting: Internal Medicine

## 2015-02-12 ENCOUNTER — Encounter (HOSPITAL_COMMUNITY): Payer: Self-pay

## 2015-02-12 DIAGNOSIS — J45909 Unspecified asthma, uncomplicated: Secondary | ICD-10-CM | POA: Diagnosis not present

## 2015-02-12 DIAGNOSIS — J449 Chronic obstructive pulmonary disease, unspecified: Secondary | ICD-10-CM | POA: Insufficient documentation

## 2015-02-12 DIAGNOSIS — Z90710 Acquired absence of both cervix and uterus: Secondary | ICD-10-CM | POA: Insufficient documentation

## 2015-02-12 DIAGNOSIS — F329 Major depressive disorder, single episode, unspecified: Secondary | ICD-10-CM | POA: Diagnosis not present

## 2015-02-12 DIAGNOSIS — M810 Age-related osteoporosis without current pathological fracture: Secondary | ICD-10-CM | POA: Insufficient documentation

## 2015-02-12 DIAGNOSIS — R1310 Dysphagia, unspecified: Secondary | ICD-10-CM | POA: Insufficient documentation

## 2015-02-12 DIAGNOSIS — K229 Disease of esophagus, unspecified: Secondary | ICD-10-CM | POA: Insufficient documentation

## 2015-02-12 DIAGNOSIS — Z8541 Personal history of malignant neoplasm of cervix uteri: Secondary | ICD-10-CM | POA: Insufficient documentation

## 2015-02-12 DIAGNOSIS — Z91040 Latex allergy status: Secondary | ICD-10-CM | POA: Diagnosis not present

## 2015-02-12 DIAGNOSIS — Z882 Allergy status to sulfonamides status: Secondary | ICD-10-CM | POA: Insufficient documentation

## 2015-02-12 DIAGNOSIS — E119 Type 2 diabetes mellitus without complications: Secondary | ICD-10-CM | POA: Insufficient documentation

## 2015-02-12 DIAGNOSIS — E039 Hypothyroidism, unspecified: Secondary | ICD-10-CM | POA: Insufficient documentation

## 2015-02-12 DIAGNOSIS — Z880 Allergy status to penicillin: Secondary | ICD-10-CM | POA: Insufficient documentation

## 2015-02-12 DIAGNOSIS — K449 Diaphragmatic hernia without obstruction or gangrene: Secondary | ICD-10-CM | POA: Diagnosis not present

## 2015-02-12 DIAGNOSIS — E785 Hyperlipidemia, unspecified: Secondary | ICD-10-CM | POA: Diagnosis not present

## 2015-02-12 DIAGNOSIS — Z86010 Personal history of colonic polyps: Secondary | ICD-10-CM | POA: Diagnosis not present

## 2015-02-12 DIAGNOSIS — Z888 Allergy status to other drugs, medicaments and biological substances status: Secondary | ICD-10-CM | POA: Insufficient documentation

## 2015-02-12 DIAGNOSIS — Z91030 Bee allergy status: Secondary | ICD-10-CM | POA: Diagnosis not present

## 2015-02-12 DIAGNOSIS — Z8542 Personal history of malignant neoplasm of other parts of uterus: Secondary | ICD-10-CM | POA: Insufficient documentation

## 2015-02-12 DIAGNOSIS — K648 Other hemorrhoids: Secondary | ICD-10-CM | POA: Diagnosis not present

## 2015-02-12 DIAGNOSIS — Z79899 Other long term (current) drug therapy: Secondary | ICD-10-CM | POA: Insufficient documentation

## 2015-02-12 DIAGNOSIS — G4730 Sleep apnea, unspecified: Secondary | ICD-10-CM | POA: Insufficient documentation

## 2015-02-12 DIAGNOSIS — K228 Other specified diseases of esophagus: Secondary | ICD-10-CM | POA: Insufficient documentation

## 2015-02-12 DIAGNOSIS — E559 Vitamin D deficiency, unspecified: Secondary | ICD-10-CM | POA: Diagnosis not present

## 2015-02-12 DIAGNOSIS — Z87440 Personal history of urinary (tract) infections: Secondary | ICD-10-CM | POA: Insufficient documentation

## 2015-02-12 DIAGNOSIS — Z886 Allergy status to analgesic agent status: Secondary | ICD-10-CM | POA: Insufficient documentation

## 2015-02-12 DIAGNOSIS — K219 Gastro-esophageal reflux disease without esophagitis: Secondary | ICD-10-CM | POA: Diagnosis present

## 2015-02-12 DIAGNOSIS — K3189 Other diseases of stomach and duodenum: Secondary | ICD-10-CM | POA: Insufficient documentation

## 2015-02-12 DIAGNOSIS — K635 Polyp of colon: Secondary | ICD-10-CM

## 2015-02-12 DIAGNOSIS — F17210 Nicotine dependence, cigarettes, uncomplicated: Secondary | ICD-10-CM | POA: Diagnosis not present

## 2015-02-12 DIAGNOSIS — K2289 Other specified disease of esophagus: Secondary | ICD-10-CM | POA: Insufficient documentation

## 2015-02-12 HISTORY — PX: BIOPSY: SHX5522

## 2015-02-12 HISTORY — PX: COLONOSCOPY WITH PROPOFOL: SHX5780

## 2015-02-12 HISTORY — PX: ESOPHAGEAL DILATION: SHX303

## 2015-02-12 HISTORY — PX: POLYPECTOMY: SHX5525

## 2015-02-12 HISTORY — PX: ESOPHAGOGASTRODUODENOSCOPY (EGD) WITH PROPOFOL: SHX5813

## 2015-02-12 LAB — GLUCOSE, CAPILLARY
Glucose-Capillary: 187 mg/dL — ABNORMAL HIGH (ref 65–99)
Glucose-Capillary: 155 mg/dL — ABNORMAL HIGH (ref 65–99)

## 2015-02-12 SURGERY — COLONOSCOPY WITH PROPOFOL
Anesthesia: Monitor Anesthesia Care | Site: Esophagus

## 2015-02-12 MED ORDER — PROPOFOL INFUSION 10 MG/ML OPTIME
INTRAVENOUS | Status: DC | PRN
  Administered 2015-02-12: 08:00:00 via INTRAVENOUS
  Administered 2015-02-12: 150 ug/kg/min via INTRAVENOUS
  Administered 2015-02-12: 08:00:00 via INTRAVENOUS

## 2015-02-12 MED ORDER — LACTATED RINGERS IV SOLN
INTRAVENOUS | Status: DC
  Administered 2015-02-12: 07:00:00 via INTRAVENOUS

## 2015-02-12 MED ORDER — MIDAZOLAM HCL 5 MG/5ML IJ SOLN
INTRAMUSCULAR | Status: DC | PRN
  Administered 2015-02-12: 2 mg via INTRAVENOUS

## 2015-02-12 MED ORDER — MIDAZOLAM HCL 2 MG/2ML IJ SOLN
1.0000 mg | INTRAMUSCULAR | Status: DC | PRN
  Administered 2015-02-12: 2 mg via INTRAVENOUS

## 2015-02-12 MED ORDER — SUCCINYLCHOLINE CHLORIDE 20 MG/ML IJ SOLN
INTRAMUSCULAR | Status: AC
  Filled 2015-02-12: qty 1

## 2015-02-12 MED ORDER — PROPOFOL 10 MG/ML IV BOLUS
INTRAVENOUS | Status: AC
  Filled 2015-02-12: qty 20

## 2015-02-12 MED ORDER — ONDANSETRON HCL 4 MG/2ML IJ SOLN
INTRAMUSCULAR | Status: AC
  Filled 2015-02-12: qty 2

## 2015-02-12 MED ORDER — EPHEDRINE SULFATE 50 MG/ML IJ SOLN
INTRAMUSCULAR | Status: AC
  Filled 2015-02-12: qty 1

## 2015-02-12 MED ORDER — FENTANYL CITRATE (PF) 100 MCG/2ML IJ SOLN
25.0000 ug | INTRAMUSCULAR | Status: AC
  Administered 2015-02-12 (×2): 25 ug via INTRAVENOUS

## 2015-02-12 MED ORDER — WATER FOR IRRIGATION, STERILE IR SOLN
Status: DC | PRN
  Administered 2015-02-12: 1000 mL via SURGICAL_CAVITY

## 2015-02-12 MED ORDER — MIDAZOLAM HCL 2 MG/2ML IJ SOLN
INTRAMUSCULAR | Status: AC
  Filled 2015-02-12: qty 2

## 2015-02-12 MED ORDER — SODIUM CHLORIDE 0.9 % IJ SOLN
INTRAMUSCULAR | Status: AC
  Filled 2015-02-12: qty 10

## 2015-02-12 MED ORDER — ONDANSETRON HCL 4 MG/2ML IJ SOLN
4.0000 mg | Freq: Once | INTRAMUSCULAR | Status: AC
  Administered 2015-02-12: 4 mg via INTRAVENOUS

## 2015-02-12 MED ORDER — STERILE WATER FOR IRRIGATION IR SOLN
Status: DC | PRN
  Administered 2015-02-12: 1000 mL

## 2015-02-12 MED ORDER — FENTANYL CITRATE (PF) 100 MCG/2ML IJ SOLN
INTRAMUSCULAR | Status: AC
Start: 2015-02-12 — End: 2015-02-12
  Filled 2015-02-12: qty 2

## 2015-02-12 MED ORDER — LIDOCAINE HCL (CARDIAC) 10 MG/ML IV SOLN
INTRAVENOUS | Status: DC | PRN
  Administered 2015-02-12: 50 mg via INTRAVENOUS

## 2015-02-12 MED ORDER — LIDOCAINE HCL (PF) 1 % IJ SOLN
INTRAMUSCULAR | Status: AC
  Filled 2015-02-12: qty 5

## 2015-02-12 MED ORDER — FENTANYL CITRATE (PF) 100 MCG/2ML IJ SOLN
INTRAMUSCULAR | Status: AC
  Filled 2015-02-12: qty 2

## 2015-02-12 MED ORDER — FENTANYL CITRATE (PF) 100 MCG/2ML IJ SOLN
INTRAMUSCULAR | Status: DC | PRN
  Administered 2015-02-12: 50 ug via INTRAVENOUS

## 2015-02-12 MED ORDER — SIMETHICONE 40 MG/0.6ML PO SUSP
ORAL | Status: AC
  Filled 2015-02-12: qty 1.8

## 2015-02-12 SURGICAL SUPPLY — 34 items
BALLN CRE LF 10-12 240X5.5 (BALLOONS)
BALLN CRE LF 10-12MM 240X5.5 (BALLOONS)
BALLN DILATOR CRE 12-15 240 (BALLOONS)
BALLN DILATOR CRE 15-18 240 (BALLOONS) IMPLANT
BALLN DILATOR CRE 18-20 240 (BALLOONS) IMPLANT
BALLN DILATOR CRE WIREGUIDE (BALLOONS)
BALLOON CRE LF 10-12 240X5.5 (BALLOONS) IMPLANT
BALLOON DILATOR CRE 12-15 240 (BALLOONS) IMPLANT
BALLOON DILATOR CRE WIREGUIDE (BALLOONS) IMPLANT
BLOCK BITE 60FR ADLT L/F BLUE (MISCELLANEOUS) ×5 IMPLANT
DEVICE CLIP HEMOSTAT 235CM (CLIP) IMPLANT
ELECT REM PT RETURN 9FT ADLT (ELECTROSURGICAL) ×5
ELECTRODE REM PT RTRN 9FT ADLT (ELECTROSURGICAL) ×3 IMPLANT
FCP BXJMBJMB 240X2.8X (CUTTING FORCEPS)
FLOOR PAD 36X40 (MISCELLANEOUS) ×5
FORCEPS BIOP RAD 4 LRG CAP 4 (CUTTING FORCEPS) ×5 IMPLANT
FORCEPS BIOP RJ4 240 W/NDL (CUTTING FORCEPS)
FORCEPS BXJMBJMB 240X2.8X (CUTTING FORCEPS) IMPLANT
FORMALIN 10 PREFIL 20ML (MISCELLANEOUS) ×15 IMPLANT
INJECTOR/SNARE I SNARE (MISCELLANEOUS) IMPLANT
KIT ENDO PROCEDURE PEN (KITS) ×5 IMPLANT
MANIFOLD NEPTUNE II (INSTRUMENTS) ×5 IMPLANT
NEEDLE SCLEROTHERAPY 25GX240 (NEEDLE) IMPLANT
PAD FLOOR 36X40 (MISCELLANEOUS) ×3 IMPLANT
PROBE APC STR FIRE (PROBE) IMPLANT
PROBE INJECTION GOLD (MISCELLANEOUS)
PROBE INJECTION GOLD 7FR (MISCELLANEOUS) IMPLANT
SNARE ROTATE MED OVAL 20MM (MISCELLANEOUS) ×5 IMPLANT
SNARE SHORT THROW 13M SML OVAL (MISCELLANEOUS) ×5 IMPLANT
SYR INFLATE BILIARY GAUGE (MISCELLANEOUS) IMPLANT
SYR INFLATION 60ML (SYRINGE) ×5 IMPLANT
TRAP SPECIMEN MUCOUS 40CC (MISCELLANEOUS) ×5 IMPLANT
TUBING IRRIGATION ENDOGATOR (MISCELLANEOUS) ×5 IMPLANT
WATER STERILE IRR 1000ML POUR (IV SOLUTION) ×5 IMPLANT

## 2015-02-12 NOTE — Op Note (Signed)
[facility] [address] [place], [address]   ENDOSCOPY PROCEDURE REPORT  PATIENT: Lisa Ewing, Lisa Ewing  MR#: [id number] BIRTHDATE: Mar 12, 1956 , 65  yrs. old GENDER: female ENDOSCOPIST: R.  Garfield Cornea, MD FACP FACG REFERRED BY:  Vic Blackbird, M.D. PROCEDURE DATE:  22-Feb-2015 PROCEDURE:  EGD w/ biopsy and Maloney dilation of esophagus INDICATIONS:  GERD; esophageal dysphagia; dyspepsia. MEDICATIONS: Deep sedation per Dr.  [facility] ASA CLASS:      Class III  CONSENT: The risks, benefits, limitations, alternatives and imponderables have been discussed.  The potential for biopsy, esophogeal dilation, etc. have also been reviewed.  Questions have been answered.  All parties agreeable.  Please see the history and physical in the medical record for more information.  DESCRIPTION OF PROCEDURE: After the risks benefits and alternatives of the procedure were thoroughly explained, informed consent was obtained.  The    endoscope was introduced through the mouth and advanced to the second portion of the duodenum , limited by Without limitations. The instrument was slowly withdrawn as the mucosa was fully examined.    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 No nodularity.No esophagitis or tumor. Stomach empty. 1 cm hiatal hernia present. Some "snake skinning" of the proximal gastric mucosa. No ulcer or infiltrating process. Patent pylorus. Normal-appearing first and second portion of the duodenum.  Scope was withdrawn and a 56 [place] Maloney dilator was passed to full insertion easily. A look back revealed no apparent complication related to this maneuver. Subsequently, biopsies of the abnormal distal esophagus taken for histologic study. Finally, biopsies of the gastric mucosa were taken.       Retroflexed views revealed a hiatal  hernia.     The scope was then withdrawn from the patient and the procedure completed.  COMPLICATIONS: There were no immediate complications.  ENDOSCOPIC IMPRESSION: Abnormal appearing distal esophagus. 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 Small hiatal hernia. Subtly abnormal gastric mucosa of uncertain significance?"status post biopsy  RECOMMENDATIONS: Follow-up on pathology. Continue Protonix 40 mg daily for now. See colonoscopy report.  REPEAT EXAM:  eSigned:  R. Garfield Cornea, MD Rosal[facility]ss[facility] 2015-02-22 8:54 AM    CC:  CPT CODES: ICD CODES:  The ICD and CPT codes recommended by this software are interpretations from the data that the clinical staff has captured with the software.  The verification of the translation of this report to the ICD and CPT codes and modifiers is the sole responsibility of the health care institution and practicing physician where this report was generated.  [employer] will not be held responsible for the validity of the ICD and CPT codes included on this report.  AMA assumes no liability for data contained or not contained herein. CPT is a Designer, television/film set of the [employer].  PATIENT NAME:  Lisa Ewing, Lisa Ewing MR#: [id number]

## 2015-02-12 NOTE — Transfer of Care (Signed)
Immediate Anesthesia Transfer of Care Note  Patient: Lisa Ewing  Procedure(s) Performed: Procedure(s) with comments: COLONOSCOPY WITH PROPOFOL (N/A) - In cecum @ 0818, out @ [phone] ESOPHAGOGASTRODUODENOSCOPY (EGD) WITH PROPOFOL (N/A) ESOPHAGEAL DILATION (N/A) - Malony # 56 BIOPSY (N/A) POLYPECTOMY (N/A) - sigmoid polyps  Patient Location: PACU  Anesthesia Type:MAC  Level of Consciousness: awake, alert , oriented and patient cooperative  Airway & Oxygen Therapy: Patient Spontanous Breathing  Post-op Assessment: Report given to RN and Post -op Vital signs reviewed and stable  Post vital signs: Reviewed and stable  Last Vitals:  Filed Vitals:   02/12/15 0735  BP: 147/78  Pulse:   Temp:   Resp: 21    Complications: No apparent anesthesia complications

## 2015-02-12 NOTE — Anesthesia Postprocedure Evaluation (Signed)
  Anesthesia Post-op Note  Patient: Lisa Ewing  Procedure(s) Performed: Procedure(s) with comments: COLONOSCOPY WITH PROPOFOL (N/A) - In cecum @ 0818, out @ [id number] ESOPHAGOGASTRODUODENOSCOPY (EGD) WITH PROPOFOL (N/A) ESOPHAGEAL DILATION (N/A) - Malony # 5 BIOPSY (N/A) POLYPECTOMY (N/A) - sigmoid polyps  Patient Location: PACU  Anesthesia Type:MAC  Level of Consciousness: awake, alert , oriented and patient cooperative  Airway and Oxygen Therapy: Patient Spontanous Breathing and Patient connected to nasal cannula oxygen  Post-op Pain: none  Post-op Assessment: Post-op Vital signs reviewed, Patient's Cardiovascular Status Stable, Respiratory Function Stable, Patent Airway and No signs of Nausea or vomiting  Post-op Vital Signs: Reviewed and stable  Last Vitals:  Filed Vitals:   02/12/15 0735  BP: 147/78  Pulse:   Temp:   Resp: 21    Complications: No apparent anesthesia complications

## 2015-02-12 NOTE — Discharge Instructions (Addendum)
GERD and colon polyp information provided Continue Protonix 40 mg daily Further recommendations to follow pending review of pathology report    Colonoscopy Discharge Instructions  Read the instructions outlined below and refer to this sheet in the next few weeks. These discharge instructions provide you with general information on caring for yourself after you leave the hospital. Your doctor may also give you specific instructions. While your treatment has been planned according to the most current medical practices available, unavoidable complications occasionally occur. If you have any problems or questions after discharge, call Dr. Gala Romney at [phone]. ACTIVITY  You may resume your regular activity, but move at a slower pace for the next 24 hours.   Take frequent rest periods for the next 24 hours.   Walking will help get rid of the air and reduce the bloated feeling in your belly (abdomen).   No driving for 24 hours (because of the medicine (anesthesia) used during the test).    Do not sign any important legal documents or operate any machinery for 24 hours (because of the anesthesia used during the test).  NUTRITION  Drink plenty of fluids.   You may resume your normal diet as instructed by your doctor.   Begin with a light meal and progress to your normal diet. Heavy or fried foods are harder to digest and may make you feel sick to your stomach (nauseated).   Avoid alcoholic beverages for 24 hours or as instructed.  MEDICATIONS  You may resume your normal medications unless your doctor tells you otherwise.  WHAT YOU CAN EXPECT TODAY  Some feelings of bloating in the abdomen.   Passage of more gas than usual.   Spotting of blood in your stool or on the toilet paper.  IF YOU HAD POLYPS REMOVED DURING THE COLONOSCOPY:  No aspirin products for 7 days or as instructed.   No alcohol for 7 days or as instructed.   Eat a soft diet for the next 24 hours.  FINDING OUT THE  RESULTS OF YOUR TEST Not all test results are available during your visit. If your test results are not back during the visit, make an appointment with your caregiver to find out the results. Do not assume everything is normal if you have not heard from your caregiver or the medical facility. It is important for you to follow up on all of your test results.  SEEK IMMEDIATE MEDICAL ATTENTION IF:  You have more than a spotting of blood in your stool.   Your belly is swollen (abdominal distention).   You are nauseated or vomiting.   You have a temperature over 101.   You have abdominal pain or discomfort that is severe or gets worse throughout the day.     EGD Discharge instructions Please read the instructions outlined below and refer to this sheet in the next few weeks. These discharge instructions provide you with general information on caring for yourself after you leave the hospital. Your doctor may also give you specific instructions. While your treatment has been planned according to the most current medical practices available, unavoidable complications occasionally occur. If you have any problems or questions after discharge, please call your doctor. ACTIVITY You may resume your regular activity but move at a slower pace for the next 24 hours.  Take frequent rest periods for the next 24 hours.  Walking will help expel (get rid of) the air and reduce the bloated feeling in your abdomen.  No driving for 24  hours (because of the anesthesia (medicine) used during the test).  You may shower.  Do not sign any important legal documents or operate any machinery for 24 hours (because of the anesthesia used during the test).  NUTRITION Drink plenty of fluids.  You may resume your normal diet.  Begin with a light meal and progress to your normal diet.  Avoid alcoholic beverages for 24 hours or as instructed by your caregiver.  MEDICATIONS You may resume your normal medications unless your  caregiver tells you otherwise.  WHAT YOU CAN EXPECT TODAY You may experience abdominal discomfort such as a feeling of fullness or gas pains.  FOLLOW-UP Your doctor will discuss the results of your test with you.  SEEK IMMEDIATE MEDICAL ATTENTION IF ANY OF THE FOLLOWING OCCUR: Excessive nausea (feeling sick to your stomach) and/or vomiting.  Severe abdominal pain and distention (swelling).  Trouble swallowing.  Temperature over 101 F (37.8 C).  Rectal bleeding or vomiting of blood.   Gastroesophageal Reflux Disease, Adult Gastroesophageal reflux disease (GERD) happens when acid from your stomach goes into your food pipe (esophagus). The acid can cause a burning feeling in your chest. Over time, the acid can make small holes (ulcers) in your food pipe.  HOME CARE  Ask your doctor for advice about:  Losing weight.  Quitting smoking.  Alcohol use.  Avoid foods and drinks that make your problems worse. You may want to avoid:  Caffeine and alcohol.  Chocolate.  Mints.  Garlic and onions.  Spicy foods.  Citrus fruits, such as oranges, lemons, or limes.  Foods that contain tomato, such as sauce, chili, salsa, and pizza.  Fried and fatty foods.  Avoid lying down for 3 hours before you go to bed or before you take a nap.  Eat small meals often, instead of large meals.  Wear loose-fitting clothing. Do not wear anything tight around your waist.  Raise (elevate) the head of your bed 6 to 8 inches with wood blocks. Using extra pillows does not help.  Only take medicines as told by your doctor.  Do not take aspirin or ibuprofen. GET HELP RIGHT AWAY IF:   You have pain in your arms, neck, jaw, teeth, or back.  Your pain gets worse or changes.  You feel sick to your stomach (nauseous), throw up (vomit), or sweat (diaphoresis).  You feel short of breath, or you pass out (faint).  Your throw up is green, yellow, black, or looks like coffee grounds or blood.  Your  poop (stool) is red, bloody, or black. MAKE SURE YOU:   Understand these instructions.  Will watch your condition.  Will get help right away if you are not doing well or get worse. Document Released: 02/11/2008 Document Revised: 11/17/2011 Document Reviewed: 03/14/2011 [facility] Patient Information 2015 [place], [place]. This information is not intended to replace advice given to you by your health care provider. Make sure you discuss any questions you have with your health care provider. Food Choices for Gastroesophageal Reflux Disease When you have gastroesophageal reflux disease (GERD), the foods you eat and your eating habits are very important. Choosing the right foods can help ease the discomfort of GERD. WHAT GENERAL GUIDELINES DO I NEED TO FOLLOW?  Choose fruits, vegetables, whole grains, low-fat dairy products, and low-fat meat, fish, and poultry.  Limit fats such as oils, salad dressings, butter, nuts, and avocado.  Keep a food diary to identify foods that cause symptoms.  Avoid foods that cause reflux. These may be  different for different people.  Eat frequent small meals instead of three large meals each day.  Eat your meals slowly, in a relaxed setting.  Limit fried foods.  Cook foods using methods other than frying.  Avoid drinking alcohol.  Avoid drinking large amounts of liquids with your meals.  Avoid bending over or lying down until 2-3 hours after eating. WHAT FOODS ARE NOT RECOMMENDED? The following are some foods and drinks that may worsen your symptoms: Vegetables Tomatoes. Tomato juice. Tomato and spaghetti sauce. Chili peppers. Onion and garlic. Horseradish. Fruits Oranges, grapefruit, and lemon (fruit and juice). Meats High-fat meats, fish, and poultry. This includes hot dogs, ribs, ham, sausage, salami, and bacon. Dairy Whole milk and chocolate milk. Sour cream. Cream. Butter. Ice cream. Cream cheese.  Beverages Coffee and tea, with or without  caffeine. Carbonated beverages or energy drinks. Condiments Hot sauce. Barbecue sauce.  Sweets/Desserts Chocolate and cocoa. Donuts. Peppermint and spearmint. Fats and Oils High-fat foods, including [place] fries and potato chips. Other Vinegar. Strong spices, such as black pepper, white pepper, red pepper, cayenne, curry powder, cloves, ginger, and chili powder. The items listed above may not be a complete list of foods and beverages to avoid. Contact your dietitian for more information. Document Released: 08/25/2005 Document Revised: 08/30/2013 Document Reviewed: 06/29/2013 [facility] Patient Information 2015 [place], [place]. This information is not intended to replace advice given to you by your health care provider. Make sure you discuss any questions you have with your health care provider. Colon Polyps Polyps are lumps of extra tissue growing inside the body. Polyps can grow in the large intestine (colon). Most colon polyps are noncancerous (benign). However, some colon polyps can become cancerous over time. Polyps that are larger than a pea may be harmful. To be safe, caregivers remove and test all polyps. CAUSES  Polyps form when mutations in the genes cause your cells to grow and divide even though no more tissue is needed. RISK FACTORS There are a number of risk factors that can increase your chances of getting colon polyps. They include:  Being older than 50 years.  Family history of colon polyps or colon cancer.  Long-term colon diseases, such as colitis or Crohn disease.  Being overweight.  Smoking.  Being inactive.  Drinking too much alcohol. SYMPTOMS  Most small polyps do not cause symptoms. If symptoms are present, they may include:  Blood in the stool. The stool may look dark red or black.  Constipation or diarrhea that lasts longer than 1 week. DIAGNOSIS People often do not know they have polyps until their caregiver finds them during a regular checkup. Your  caregiver can use 4 tests to check for polyps:  Digital rectal exam. The caregiver wears gloves and feels inside the rectum. This test would find polyps only in the rectum.  Barium enema. The caregiver puts a liquid called barium into your rectum before taking X-rays of your colon. Barium makes your colon look white. Polyps are dark, so they are easy to see in the X-ray pictures.  Sigmoidoscopy. A thin, flexible tube (sigmoidoscope) is placed into your rectum. The sigmoidoscope has a light and tiny camera in it. The caregiver uses the sigmoidoscope to look at the last third of your colon.  Colonoscopy. This test is like sigmoidoscopy, but the caregiver looks at the entire colon. This is the most common method for finding and removing polyps. TREATMENT  Any polyps will be removed during a sigmoidoscopy or colonoscopy. The polyps are then tested  for cancer. PREVENTION  To help lower your risk of getting more colon polyps:  Eat plenty of fruits and vegetables. Avoid eating fatty foods.  Do not smoke.  Avoid drinking alcohol.  Exercise every day.  Lose weight if recommended by your caregiver.  Eat plenty of calcium and folate. Foods that are rich in calcium include milk, cheese, and broccoli. Foods that are rich in folate include chickpeas, kidney beans, and spinach. HOME CARE INSTRUCTIONS Keep all follow-up appointments as directed by your caregiver. You may need periodic exams to check for polyps. SEEK MEDICAL CARE IF: You notice bleeding during a bowel movement. Document Released: 05/21/2004 Document Revised: 11/17/2011 Document Reviewed: 11/04/2011 [facility] Patient Information 2015 [place], [place]. This information is not intended to replace advice given to you by your health care provider. Make sure you discuss any questions you have with your health care provider.

## 2015-02-12 NOTE — Op Note (Signed)
[facility] [address] [place], [address]   COLONOSCOPY PROCEDURE REPORT  PATIENT: Charee, Tumblin  MR#: [id number] BIRTHDATE: Dec 29, 1955 , 51  yrs. old GENDER: female ENDOSCOPIST: R.  Garfield Cornea, MD FACP [facility] REFERRED [id number] [place], M.D. PROCEDURE DATE:  Feb 24, 2015 PROCEDURE:   Colonoscopy with ablation and snare polypectomy INDICATIONS:Surveillance examination; history of colonic adenoma. MEDICATIONS: Deep sedation per Dr.  [facility] ASA CLASS:       Class III  CONSENT: The risks, benefits, alternatives and imponderables including but not limited to bleeding, perforation as well as the possibility of a missed lesion have been reviewed.  The potential for biopsy, lesion removal, etc. have also been discussed. Questions have been answered.  All parties agreeable.  Please see the history and physical in the medical record for more information.  DESCRIPTION OF PROCEDURE:   After the risks benefits and alternatives of the procedure were thoroughly explained, informed consent was obtained.  The digital rectal exam revealed only a single external hemorrhoidal tag.        The     endoscope was introduced through the anus and advanced to the cecum, which was identified by both the appendix and ileocecal valve. No adverse events experienced.   The quality of the prep was adequate  The instrument was then slowly withdrawn as the colon was fully examined.      COLON FINDINGS: Single external hemorrhoid tag; otherwise, normal-appearing rectal mucosa.  Multiple diminutive to 5 mm hyperplastic appearing polyps in the sigmoid segment; otherwise, the remainder of the colonic mucosa appeared normal.  Multiple hot and cold snare polypectomies performed.  Multiple polyp ablations performed with the tip of the hot snare loop in the sigmoid segment.  Retroflexion was performed. .  Withdrawal time=15 minutes 0 seconds.  The scope was withdrawn  and the procedure completed. COMPLICATIONS: There were no immediate complications.  ENDOSCOPIC IMPRESSION: Multiple colonic polyps ablated/removed as described above.  RECOMMENDATIONS: Follow up on pathology. See EGD report.  eSigned:  R. Garfield Cornea, MD Rosalita Chessman [facility] February 24, 2015 8:58 AM   cc:  CPT CODES: ICD CODES:  The ICD and CPT codes recommended by this software are interpretations from the data that the clinical staff has captured with the software.  The verification of the translation of this report to the ICD and CPT codes and modifiers is the sole responsibility of the health care institution and practicing physician where this report was generated.  [employer]. will not be held responsible for the validity of the ICD and CPT codes included on this report.  AMA assumes no liability for data contained or not contained herein. CPT is a Designer, television/film set of the [employer].  PATIENT NAME:  Allona, Gondek MR#: [id number]

## 2015-02-12 NOTE — Interval H&P Note (Signed)
History and Physical Interval Note:  02/12/2015 7:36 AM  Lisa Ewing  has presented today for surgery, with the diagnosis of hx of polyps, dysphagia  The various methods of treatment have been discussed with the patient and family. After consideration of risks, benefits and other options for treatment, the patient has consented to  Procedure(s) with comments: COLONOSCOPY WITH PROPOFOL (N/A) - 0730 ESOPHAGOGASTRODUODENOSCOPY (EGD) WITH PROPOFOL (N/A) ESOPHAGEAL DILATION (N/A) as a surgical intervention .  The patient's history has been reviewed, patient examined, no change in status, stable for surgery.  I have reviewed the patient's chart and labs.  Questions were answered to the patient's satisfaction.     Lisa Ewing  No change. EGD with esophageal dilation as appropriate/feasible and colonoscopy per plan.  The risks, benefits, limitations, imponderables and alternatives regarding both EGD and colonoscopy have been reviewed with the patient. Questions have been answered. All parties agreeable.

## 2015-02-12 NOTE — Anesthesia Procedure Notes (Signed)
Procedure Name: MAC Date/Time: 02/12/2015 7:41 AM Performed by: Andree Elk, Renley Gutman A Pre-anesthesia Checklist: Patient identified, Timeout performed, Emergency Drugs available, Suction available and Patient being monitored Oxygen Delivery Method: Simple face mask
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
## 2015-02-12 NOTE — Anesthesia Preprocedure Evaluation (Signed)
Anesthesia Evaluation  Patient identified by MRN, date of birth, ID band Patient awake    Reviewed: Allergy & Precautions, NPO status , Patient's Chart, lab work & pertinent test results  Airway Mallampati: II  TM Distance: >3 FB     Dental  (+) Teeth Intact   Pulmonary asthma , sleep apnea , COPDCurrent Smoker,  breath sounds clear to auscultation        Cardiovascular Rhythm:Regular Rate:Normal     Neuro/Psych PSYCHIATRIC DISORDERS Depression    GI/Hepatic GERD-  Controlled and Medicated,  Endo/Other  diabetes, Type 2, Oral Hypoglycemic AgentsHypothyroidism   Renal/GU      Musculoskeletal   Abdominal   Peds  Hematology   Anesthesia Other Findings   Reproductive/Obstetrics                             Anesthesia Physical Anesthesia Plan  ASA: III  Anesthesia Plan: MAC   Post-op Pain Management:    Induction: Intravenous  Airway Management Planned: Simple Face Mask  Additional Equipment:   Intra-op Plan:   Post-operative Plan:   Informed Consent: I have reviewed the patients History and Physical, chart, labs and discussed the procedure including the risks, benefits and alternatives for the proposed anesthesia with the patient or authorized representative who has indicated his/her understanding and acceptance.     Plan Discussed with:   Anesthesia Plan Comments:         Anesthesia Quick Evaluation

## 2015-02-13 ENCOUNTER — Encounter (HOSPITAL_COMMUNITY): Payer: Self-pay | Admitting: Internal Medicine

## 2015-02-13 ENCOUNTER — Other Ambulatory Visit: Payer: Self-pay

## 2015-02-13 NOTE — Progress Notes (Signed)
Urine drug screen for this encounter is consistent for prescribed medication 

## 2015-02-16 NOTE — Progress Notes (Signed)
Quick Note:  Gastritis, no H.pylori. Esophagus with chronic inflammation but non-specific findings. No Barrett's, which is good news. Would increase PPI to BID now. Needs strict following of GERD diet. Further recommendations per Dr. Gala Romney. ______

## 2015-02-21 ENCOUNTER — Other Ambulatory Visit: Payer: Self-pay | Admitting: Family Medicine

## 2015-02-22 NOTE — Telephone Encounter (Signed)
Refill appropriate and filled per protocol.l 

## 2015-02-27 ENCOUNTER — Encounter (HOSPITAL_BASED_OUTPATIENT_CLINIC_OR_DEPARTMENT_OTHER): Payer: Self-pay

## 2015-03-01 ENCOUNTER — Ambulatory Visit (HOSPITAL_BASED_OUTPATIENT_CLINIC_OR_DEPARTMENT_OTHER)
Admission: RE | Admit: 2015-03-01 | Discharge: 2015-03-02 | Disposition: A | Discharge status: Home / Self Care | Payer: Medicaid Other | Source: Ambulatory Visit | Attending: Orthopedic Surgery | Admitting: Orthopedic Surgery

## 2015-03-01 ENCOUNTER — Ambulatory Visit (HOSPITAL_BASED_OUTPATIENT_CLINIC_OR_DEPARTMENT_OTHER): Payer: Medicaid Other | Admitting: Anesthesiology

## 2015-03-01 ENCOUNTER — Encounter (HOSPITAL_BASED_OUTPATIENT_CLINIC_OR_DEPARTMENT_OTHER)
Admission: RE | Disposition: A | Discharge status: Home / Self Care | Payer: Self-pay | Source: Ambulatory Visit | Attending: Orthopedic Surgery

## 2015-03-01 ENCOUNTER — Encounter (HOSPITAL_BASED_OUTPATIENT_CLINIC_OR_DEPARTMENT_OTHER): Payer: Self-pay | Admitting: *Deleted

## 2015-03-01 DIAGNOSIS — J449 Chronic obstructive pulmonary disease, unspecified: Secondary | ICD-10-CM | POA: Diagnosis not present

## 2015-03-01 DIAGNOSIS — Z882 Allergy status to sulfonamides status: Secondary | ICD-10-CM | POA: Insufficient documentation

## 2015-03-01 DIAGNOSIS — E039 Hypothyroidism, unspecified: Secondary | ICD-10-CM | POA: Diagnosis not present

## 2015-03-01 DIAGNOSIS — Z79899 Other long term (current) drug therapy: Secondary | ICD-10-CM | POA: Diagnosis not present

## 2015-03-01 DIAGNOSIS — Z888 Allergy status to other drugs, medicaments and biological substances status: Secondary | ICD-10-CM | POA: Insufficient documentation

## 2015-03-01 DIAGNOSIS — Z6841 Body Mass Index (BMI) 40.0 and over, adult: Secondary | ICD-10-CM | POA: Diagnosis not present

## 2015-03-01 DIAGNOSIS — Z91030 Bee allergy status: Secondary | ICD-10-CM | POA: Diagnosis not present

## 2015-03-01 DIAGNOSIS — M1811 Unilateral primary osteoarthritis of first carpometacarpal joint, right hand: Secondary | ICD-10-CM | POA: Insufficient documentation

## 2015-03-01 DIAGNOSIS — Z885 Allergy status to narcotic agent status: Secondary | ICD-10-CM | POA: Diagnosis not present

## 2015-03-01 DIAGNOSIS — G8929 Other chronic pain: Secondary | ICD-10-CM | POA: Insufficient documentation

## 2015-03-01 DIAGNOSIS — E559 Vitamin D deficiency, unspecified: Secondary | ICD-10-CM | POA: Diagnosis not present

## 2015-03-01 DIAGNOSIS — E785 Hyperlipidemia, unspecified: Secondary | ICD-10-CM | POA: Insufficient documentation

## 2015-03-01 DIAGNOSIS — Z8541 Personal history of malignant neoplasm of cervix uteri: Secondary | ICD-10-CM | POA: Diagnosis not present

## 2015-03-01 DIAGNOSIS — G4730 Sleep apnea, unspecified: Secondary | ICD-10-CM | POA: Insufficient documentation

## 2015-03-01 DIAGNOSIS — M810 Age-related osteoporosis without current pathological fracture: Secondary | ICD-10-CM | POA: Insufficient documentation

## 2015-03-01 DIAGNOSIS — Z90710 Acquired absence of both cervix and uterus: Secondary | ICD-10-CM | POA: Diagnosis not present

## 2015-03-01 DIAGNOSIS — Z91013 Allergy to seafood: Secondary | ICD-10-CM | POA: Insufficient documentation

## 2015-03-01 DIAGNOSIS — F17210 Nicotine dependence, cigarettes, uncomplicated: Secondary | ICD-10-CM | POA: Insufficient documentation

## 2015-03-01 DIAGNOSIS — K219 Gastro-esophageal reflux disease without esophagitis: Secondary | ICD-10-CM | POA: Insufficient documentation

## 2015-03-01 DIAGNOSIS — Z880 Allergy status to penicillin: Secondary | ICD-10-CM | POA: Insufficient documentation

## 2015-03-01 DIAGNOSIS — Z91040 Latex allergy status: Secondary | ICD-10-CM | POA: Diagnosis not present

## 2015-03-01 DIAGNOSIS — F329 Major depressive disorder, single episode, unspecified: Secondary | ICD-10-CM | POA: Insufficient documentation

## 2015-03-01 DIAGNOSIS — E119 Type 2 diabetes mellitus without complications: Secondary | ICD-10-CM | POA: Insufficient documentation

## 2015-03-01 DIAGNOSIS — Z8542 Personal history of malignant neoplasm of other parts of uterus: Secondary | ICD-10-CM | POA: Diagnosis not present

## 2015-03-01 DIAGNOSIS — Z91018 Allergy to other foods: Secondary | ICD-10-CM | POA: Diagnosis not present

## 2015-03-01 DIAGNOSIS — J45909 Unspecified asthma, uncomplicated: Secondary | ICD-10-CM | POA: Diagnosis not present

## 2015-03-01 DIAGNOSIS — M1990 Unspecified osteoarthritis, unspecified site: Secondary | ICD-10-CM | POA: Diagnosis present

## 2015-03-01 HISTORY — PX: TENDON TRANSFER: SHX6109

## 2015-03-01 HISTORY — PX: CARPOMETACARPEL SUSPENSION PLASTY: SHX5005

## 2015-03-01 HISTORY — PX: CARPAL BONE EXCISION: SHX6468

## 2015-03-01 LAB — POCT I-STAT, CHEM 8
BUN: 16 mg/dL (ref 6–20)
CALCIUM ION: 1.07 mmol/L — AB (ref 1.12–1.23)
CREATININE: 0.8 mg/dL (ref 0.44–1.00)
Chloride: 100 mmol/L — ABNORMAL LOW (ref 101–111)
GLUCOSE: 167 mg/dL — AB (ref 65–99)
HEMATOCRIT: 47 % — AB (ref 36.0–46.0)
HEMOGLOBIN: 16 g/dL — AB (ref 12.0–15.0)
Potassium: 4.1 mmol/L (ref 3.5–5.1)
Sodium: 139 mmol/L (ref 135–145)
TCO2: 24 mmol/L (ref 0–100)

## 2015-03-01 LAB — GLUCOSE, CAPILLARY
GLUCOSE-CAPILLARY: 201 mg/dL — AB (ref 65–99)
Glucose-Capillary: 169 mg/dL — ABNORMAL HIGH (ref 65–99)
Glucose-Capillary: 164 mg/dL — ABNORMAL HIGH (ref 65–99)

## 2015-03-01 SURGERY — EXCISION, CARPAL BONE
Anesthesia: General | Site: Thumb | Laterality: Right

## 2015-03-01 MED ORDER — HYDROCODONE-ACETAMINOPHEN 10-325 MG PO TABS
1.0000 | ORAL_TABLET | Freq: Four times a day (QID) | ORAL | Status: DC | PRN
Start: 2015-03-01 — End: 2015-03-20

## 2015-03-01 MED ORDER — MONTELUKAST SODIUM 10 MG PO TABS
10.0000 mg | ORAL_TABLET | Freq: Every day | ORAL | Status: DC
  Administered 2015-03-01: 10 mg via ORAL

## 2015-03-01 MED ORDER — ONDANSETRON HCL 4 MG/2ML IJ SOLN: 4.0000 mg | Freq: Four times a day (QID) | INTRAMUSCULAR | Status: DC | PRN

## 2015-03-01 MED ORDER — HYDROCODONE-ACETAMINOPHEN 10-325 MG PO TABS
1.0000 | ORAL_TABLET | Freq: Three times a day (TID) | ORAL | Status: DC
  Administered 2015-03-02: 1 via ORAL
  Filled 2015-03-01: qty 1

## 2015-03-01 MED ORDER — CHLORHEXIDINE GLUCONATE 4 % EX LIQD: 60.0000 mL | Freq: Once | CUTANEOUS | Status: DC

## 2015-03-01 MED ORDER — LEVOTHYROXINE SODIUM 137 MCG PO TABS: 137.0000 ug | ORAL_TABLET | Freq: Every day | ORAL | Status: DC

## 2015-03-01 MED ORDER — HYDROMORPHONE HCL 1 MG/ML IJ SOLN
0.5000 mg | INTRAMUSCULAR | Status: DC | PRN
  Administered 2015-03-01 – 2015-03-02 (×2): 0.5 mg via INTRAVENOUS
  Filled 2015-03-01 (×2): qty 1

## 2015-03-01 MED ORDER — MIDAZOLAM HCL 2 MG/2ML IJ SOLN
INTRAMUSCULAR | Status: AC
  Filled 2015-03-01: qty 2

## 2015-03-01 MED ORDER — ALBUTEROL SULFATE (2.5 MG/3ML) 0.083% IN NEBU
2.5000 mg | INHALATION_SOLUTION | RESPIRATORY_TRACT | Status: DC | PRN
  Administered 2015-03-01: 2.5 mg via RESPIRATORY_TRACT
  Filled 2015-03-01: qty 3

## 2015-03-01 MED ORDER — ONDANSETRON HCL 4 MG PO TABS: 4.0000 mg | ORAL_TABLET | Freq: Four times a day (QID) | ORAL | Status: DC | PRN

## 2015-03-01 MED ORDER — FENTANYL CITRATE (PF) 100 MCG/2ML IJ SOLN
INTRAMUSCULAR | Status: AC
  Filled 2015-03-01: qty 4

## 2015-03-01 MED ORDER — OXYCODONE HCL 5 MG/5ML PO SOLN: 5.0000 mg | Freq: Once | ORAL | Status: DC | PRN

## 2015-03-01 MED ORDER — ALBUTEROL SULFATE (2.5 MG/3ML) 0.083% IN NEBU
INHALATION_SOLUTION | RESPIRATORY_TRACT | Status: AC
  Filled 2015-03-01: qty 3

## 2015-03-01 MED ORDER — HEPARIN SODIUM (PORCINE) 5000 UNIT/ML IJ SOLN
INTRAMUSCULAR | Status: AC
  Filled 2015-03-01: qty 1

## 2015-03-01 MED ORDER — DARIFENACIN HYDROBROMIDE ER 7.5 MG PO TB24
7.5000 mg | ORAL_TABLET | Freq: Every day | ORAL | Status: DC
  Administered 2015-03-01: 7.5 mg via ORAL

## 2015-03-01 MED ORDER — ACYCLOVIR 400 MG PO TABS
400.0000 mg | ORAL_TABLET | Freq: Every day | ORAL | Status: DC
  Administered 2015-03-01: 400 mg via ORAL

## 2015-03-01 MED ORDER — HYOSCYAMINE SULFATE 0.125 MG SL SUBL: 0.1250 mg | SUBLINGUAL_TABLET | SUBLINGUAL | Status: DC | PRN

## 2015-03-01 MED ORDER — FENTANYL CITRATE (PF) 100 MCG/2ML IJ SOLN
50.0000 ug | INTRAMUSCULAR | Status: DC | PRN
  Administered 2015-03-01: 100 ug via INTRAVENOUS
  Administered 2015-03-01: 50 ug via INTRAVENOUS

## 2015-03-01 MED ORDER — SODIUM CHLORIDE 0.9 % IV SOLN
1500.0000 mg | INTRAVENOUS | Status: AC
  Administered 2015-03-01: 1500 mg via INTRAVENOUS

## 2015-03-01 MED ORDER — LORATADINE 10 MG PO TABS
10.0000 mg | ORAL_TABLET | Freq: Every day | ORAL | Status: DC
  Administered 2015-03-01: 10 mg via ORAL

## 2015-03-01 MED ORDER — EZETIMIBE 10 MG PO TABS
10.0000 mg | ORAL_TABLET | Freq: Every day | ORAL | Status: DC
  Administered 2015-03-01: 10 mg via ORAL

## 2015-03-01 MED ORDER — MOMETASONE FURO-FORMOTEROL FUM 100-5 MCG/ACT IN AERO: 2.0000 | INHALATION_SPRAY | Freq: Two times a day (BID) | RESPIRATORY_TRACT | Status: DC

## 2015-03-01 MED ORDER — HEPARIN SODIUM (PORCINE) 5000 UNIT/ML IJ SOLN
5000.0000 [IU] | Freq: Three times a day (TID) | INTRAMUSCULAR | Status: DC
  Administered 2015-03-01: 5000 [IU] via SUBCUTANEOUS

## 2015-03-01 MED ORDER — ALBUTEROL SULFATE (2.5 MG/3ML) 0.083% IN NEBU
2.5000 mg | INHALATION_SOLUTION | Freq: Four times a day (QID) | RESPIRATORY_TRACT | Status: DC | PRN
  Administered 2015-03-01: 2.5 mg via RESPIRATORY_TRACT

## 2015-03-01 MED ORDER — METHOCARBAMOL 500 MG PO TABS
500.0000 mg | ORAL_TABLET | Freq: Three times a day (TID) | ORAL | Status: DC
  Administered 2015-03-01 – 2015-03-02 (×2): 500 mg via ORAL
  Filled 2015-03-01: qty 1

## 2015-03-01 MED ORDER — FUROSEMIDE 20 MG PO TABS
20.0000 mg | ORAL_TABLET | Freq: Every day | ORAL | Status: DC
  Administered 2015-03-01: 20 mg via ORAL

## 2015-03-01 MED ORDER — SCOPOLAMINE 1 MG/3DAYS TD PT72
1.0000 | MEDICATED_PATCH | Freq: Once | TRANSDERMAL | Status: DC | PRN
Start: 2015-03-01 — End: 2015-03-01

## 2015-03-01 MED ORDER — SODIUM CHLORIDE 0.9 % IV SOLN: 1500.0000 mg | INTRAVENOUS | Status: DC

## 2015-03-01 MED ORDER — HYDROMORPHONE HCL 1 MG/ML IJ SOLN: 0.2500 mg | INTRAMUSCULAR | Status: DC | PRN

## 2015-03-01 MED ORDER — VANCOMYCIN HCL IN DEXTROSE 500-5 MG/100ML-% IV SOLN
INTRAVENOUS | Status: AC
Start: 2015-03-01 — End: 2015-03-01
  Filled 2015-03-01: qty 100

## 2015-03-01 MED ORDER — TRAZODONE HCL 100 MG PO TABS: 100.0000 mg | ORAL_TABLET | Freq: Every day | ORAL | Status: DC

## 2015-03-01 MED ORDER — PROPOFOL 10 MG/ML IV BOLUS
INTRAVENOUS | Status: DC | PRN
  Administered 2015-03-01: 250 mg via INTRAVENOUS

## 2015-03-01 MED ORDER — METFORMIN HCL 500 MG PO TABS
1000.0000 mg | ORAL_TABLET | Freq: Two times a day (BID) | ORAL | Status: DC
  Administered 2015-03-01: 1000 mg via ORAL

## 2015-03-01 MED ORDER — PANTOPRAZOLE SODIUM 40 MG PO TBEC
40.0000 mg | DELAYED_RELEASE_TABLET | Freq: Every day | ORAL | Status: DC
  Administered 2015-03-01: 40 mg via ORAL

## 2015-03-01 MED ORDER — FENTANYL CITRATE (PF) 100 MCG/2ML IJ SOLN
INTRAMUSCULAR | Status: AC
  Filled 2015-03-01: qty 2

## 2015-03-01 MED ORDER — OXYCODONE HCL 5 MG PO TABS: 5.0000 mg | ORAL_TABLET | Freq: Once | ORAL | Status: DC | PRN

## 2015-03-01 MED ORDER — MEPERIDINE HCL 25 MG/ML IJ SOLN: 6.2500 mg | INTRAMUSCULAR | Status: DC | PRN

## 2015-03-01 MED ORDER — SODIUM CHLORIDE 0.9 % IV SOLN
INTRAVENOUS | Status: DC
  Administered 2015-03-01: 17:00:00 via INTRAVENOUS

## 2015-03-01 MED ORDER — MIDAZOLAM HCL 2 MG/2ML IJ SOLN
1.0000 mg | INTRAMUSCULAR | Status: DC | PRN
  Administered 2015-03-01 (×2): 2 mg via INTRAVENOUS

## 2015-03-01 MED ORDER — BUPIVACAINE HCL (PF) 0.25 % IJ SOLN
INTRAMUSCULAR | Status: AC
  Filled 2015-03-01: qty 30

## 2015-03-01 MED ORDER — ATORVASTATIN CALCIUM 40 MG PO TABS
40.0000 mg | ORAL_TABLET | Freq: Every day | ORAL | Status: DC
  Administered 2015-03-01: 40 mg via ORAL

## 2015-03-01 MED ORDER — ESCITALOPRAM OXALATE 20 MG PO TABS
20.0000 mg | ORAL_TABLET | Freq: Every day | ORAL | Status: DC
  Administered 2015-03-01: 20 mg via ORAL

## 2015-03-01 MED ORDER — VANCOMYCIN HCL IN DEXTROSE 500-5 MG/100ML-% IV SOLN
INTRAVENOUS | Status: AC
  Filled 2015-03-01: qty 200

## 2015-03-01 MED ORDER — ALBUTEROL SULFATE HFA 108 (90 BASE) MCG/ACT IN AERS: 2.0000 | INHALATION_SPRAY | RESPIRATORY_TRACT | Status: DC | PRN

## 2015-03-01 MED ORDER — LIDOCAINE HCL (CARDIAC) 20 MG/ML IV SOLN
INTRAVENOUS | Status: DC | PRN
  Administered 2015-03-01: 50 mg via INTRAVENOUS

## 2015-03-01 MED ORDER — LINAGLIPTIN 5 MG PO TABS
5.0000 mg | ORAL_TABLET | Freq: Every day | ORAL | Status: DC
  Administered 2015-03-01: 5 mg via ORAL

## 2015-03-01 MED ORDER — GLYCOPYRROLATE 0.2 MG/ML IJ SOLN: 0.2000 mg | Freq: Once | INTRAMUSCULAR | Status: DC | PRN

## 2015-03-01 MED ORDER — LACTATED RINGERS IV SOLN
INTRAVENOUS | Status: DC
  Administered 2015-03-01 (×2): via INTRAVENOUS
  Administered 2015-03-01: 10 mL/h via INTRAVENOUS

## 2015-03-01 MED ORDER — GABAPENTIN 300 MG PO CAPS
300.0000 mg | ORAL_CAPSULE | Freq: Three times a day (TID) | ORAL | Status: DC
Start: 2015-03-01 — End: 2015-03-02
  Administered 2015-03-01: 300 mg via ORAL

## 2015-03-01 MED ORDER — ONDANSETRON HCL 4 MG/2ML IJ SOLN
INTRAMUSCULAR | Status: DC | PRN
  Administered 2015-03-01: 4 mg via INTRAVENOUS

## 2015-03-01 MED ORDER — BUPIVACAINE HCL (PF) 0.25 % IJ SOLN
INTRAMUSCULAR | Status: DC | PRN
  Administered 2015-03-01: 5 mL

## 2015-03-01 SURGICAL SUPPLY — 97 items
BAG DECANTER FOR FLEXI CONT (MISCELLANEOUS) IMPLANT
BIT DRILL 5/64X5 DISP (BIT) ×4 IMPLANT
BIT DRILL JACOB END 9/64INX5IN (BIT) ×4 IMPLANT
BLADE ARTHRO LOK 4 BEAVER (BLADE) ×3 IMPLANT
BLADE ARTHRO LOK 4MM BEAVER (BLADE) ×1
BLADE MINI RND TIP GREEN BEAV (BLADE) ×4 IMPLANT
BLADE SURG 15 STRL LF DISP TIS (BLADE) ×2 IMPLANT
BLADE SURG 15 STRL SS (BLADE) ×2
BNDG COHESIVE 3X5 TAN STRL LF (GAUZE/BANDAGES/DRESSINGS) ×4 IMPLANT
BNDG ESMARK 4X9 LF (GAUZE/BANDAGES/DRESSINGS) ×4 IMPLANT
BNDG GAUZE ELAST 4 BULKY (GAUZE/BANDAGES/DRESSINGS) ×4 IMPLANT
BUR EGG 3PK/BX (BURR) IMPLANT
CHLORAPREP W/TINT 26ML (MISCELLANEOUS) ×4 IMPLANT
CLOSURE WOUND 1/4X4 (GAUZE/BANDAGES/DRESSINGS)
CORDS BIPOLAR (ELECTRODE) ×4 IMPLANT
COTTONBALL LRG STERILE PKG (GAUZE/BANDAGES/DRESSINGS) IMPLANT
COVER BACK TABLE 60X90IN (DRAPES) ×4 IMPLANT
COVER MAYO STAND STRL (DRAPES) ×4 IMPLANT
CUFF TOURNIQUET SINGLE 18IN (TOURNIQUET CUFF) ×4 IMPLANT
CUFF TOURNIQUET SINGLE 24IN (TOURNIQUET CUFF) ×4 IMPLANT
DECANTER SPIKE VIAL GLASS SM (MISCELLANEOUS) ×4 IMPLANT
DRAIN TLS ROUND 10FR (DRAIN) IMPLANT
DRAPE EXTREMITY T 121X128X90 (DRAPE) ×4 IMPLANT
DRAPE OEC MINIVIEW 54X84 (DRAPES) ×4 IMPLANT
DRAPE SURG 17X23 STRL (DRAPES) ×4 IMPLANT
DRSG KUZMA FLUFF (GAUZE/BANDAGES/DRESSINGS) IMPLANT
GAUZE SPONGE 4X4 12PLY STRL (GAUZE/BANDAGES/DRESSINGS) ×4 IMPLANT
GAUZE SPONGE 4X4 16PLY XRAY LF (GAUZE/BANDAGES/DRESSINGS) IMPLANT
GAUZE XEROFORM 1X8 LF (GAUZE/BANDAGES/DRESSINGS) ×4 IMPLANT
GLOVE BIOGEL PI IND STRL 7.0 (GLOVE) ×2 IMPLANT
GLOVE BIOGEL PI IND STRL 8.5 (GLOVE) ×2 IMPLANT
GLOVE BIOGEL PI INDICATOR 7.0 (GLOVE) ×2
GLOVE BIOGEL PI INDICATOR 8.5 (GLOVE) ×2
GLOVE SURG ORTHO 8.0 STRL STRW (GLOVE) IMPLANT
GLOVE SURG SS PI 7.0 STRL IVOR (GLOVE) ×4 IMPLANT
GLOVE SURG SS PI 8.0 STRL IVOR (GLOVE) ×4 IMPLANT
GLOVE SURG SS PI 8.5 STRL IVOR (GLOVE) ×2
GLOVE SURG SS PI 8.5 STRL STRW (GLOVE) ×2 IMPLANT
GOWN STRL REUS W/ TWL LRG LVL3 (GOWN DISPOSABLE) ×2 IMPLANT
GOWN STRL REUS W/TWL LRG LVL3 (GOWN DISPOSABLE) ×2
GOWN STRL REUS W/TWL XL LVL3 (GOWN DISPOSABLE) ×4 IMPLANT
K-WIRE .035X4 (WIRE) IMPLANT
LOOP VESSEL MAXI BLUE (MISCELLANEOUS) IMPLANT
NEEDLE HYPO 22GX1.5 SAFETY (NEEDLE) IMPLANT
NEEDLE KEITH (NEEDLE) IMPLANT
NEEDLE PRECISIONGLIDE 27X1.5 (NEEDLE) ×4 IMPLANT
NS IRRIG 1000ML POUR BTL (IV SOLUTION) ×4 IMPLANT
PACK BASIN DAY SURGERY FS (CUSTOM PROCEDURE TRAY) ×4 IMPLANT
PAD CAST 3X4 CTTN HI CHSV (CAST SUPPLIES) ×2 IMPLANT
PADDING CAST ABS 3INX4YD NS (CAST SUPPLIES)
PADDING CAST ABS 4INX4YD NS (CAST SUPPLIES)
PADDING CAST ABS COTTON 3X4 (CAST SUPPLIES) IMPLANT
PADDING CAST ABS COTTON 4X4 ST (CAST SUPPLIES) IMPLANT
PADDING CAST COTTON 3X4 STRL (CAST SUPPLIES) ×2
RUBBERBAND STERILE (MISCELLANEOUS) IMPLANT
SLEEVE SCD COMPRESS KNEE MED (MISCELLANEOUS) ×4 IMPLANT
SLING ARM FOAM STRAP XLG (SOFTGOODS) ×4 IMPLANT
SPLINT PLASTER CAST XFAST 3X15 (CAST SUPPLIES) ×30 IMPLANT
SPLINT PLASTER XTRA FASTSET 3X (CAST SUPPLIES) ×30
STOCKINETTE 4X48 STRL (DRAPES) ×4 IMPLANT
STRIP CLOSURE SKIN 1/4X4 (GAUZE/BANDAGES/DRESSINGS) IMPLANT
SUT CHROMIC 5 0 P 3 (SUTURE) IMPLANT
SUT ETHIBOND 2 OS 4 DA (SUTURE) IMPLANT
SUT ETHIBOND 3-0 V-5 (SUTURE) IMPLANT
SUT ETHILON 4 0 PS 2 18 (SUTURE) ×8 IMPLANT
SUT FIBERWIRE 2-0 18 17.9 3/8 (SUTURE)
SUT FIBERWIRE 4-0 18 DIAM BLUE (SUTURE) ×4
SUT FIBERWIRE 4-0 18 TAPR NDL (SUTURE) ×4
SUT MERSILENE 2.0 SH NDLE (SUTURE) IMPLANT
SUT MERSILENE 3 0 FS 1 (SUTURE) ×4 IMPLANT
SUT MERSILENE 4 0 P 3 (SUTURE) IMPLANT
SUT POLY BUTTON 15MM (SUTURE) IMPLANT
SUT PROLENE 2 0 SH DA (SUTURE) IMPLANT
SUT SILK 2 0 FS (SUTURE) IMPLANT
SUT SILK 4 0 PS 2 (SUTURE) IMPLANT
SUT STEEL 3 0 (SUTURE) ×4 IMPLANT
SUT STEEL 4 0 (SUTURE) ×4 IMPLANT
SUT STEEL 4 0 V 26 (SUTURE) IMPLANT
SUT VIC AB 0 CT1 27 (SUTURE)
SUT VIC AB 0 CT1 27XBRD ANBCTR (SUTURE) IMPLANT
SUT VIC AB 2-0 SH 27 (SUTURE)
SUT VIC AB 2-0 SH 27XBRD (SUTURE) IMPLANT
SUT VIC AB 3-0 PS1 18 (SUTURE)
SUT VIC AB 3-0 PS1 18XBRD (SUTURE) IMPLANT
SUT VIC AB 4-0 P-3 18XBRD (SUTURE) IMPLANT
SUT VIC AB 4-0 P2 18 (SUTURE) IMPLANT
SUT VIC AB 4-0 P3 18 (SUTURE)
SUT VICRYL 4-0 PS2 18IN ABS (SUTURE) ×4 IMPLANT
SUTURE FIBERWR 2-0 18 17.9 3/8 (SUTURE) IMPLANT
SUTURE FIBERWR 4-0 18 DIA BLUE (SUTURE) ×2 IMPLANT
SUTURE FIBERWR 4-0 18 TAPR NDL (SUTURE) ×2 IMPLANT
SYR BULB 3OZ (MISCELLANEOUS) ×4 IMPLANT
SYR CONTROL 10ML LL (SYRINGE) ×4 IMPLANT
TOWEL OR 17X24 6PK STRL BLUE (TOWEL DISPOSABLE) ×8 IMPLANT
TOWEL OR NON WOVEN STRL DISP B (DISPOSABLE) ×4 IMPLANT
TUBE FEEDING 5FR 15 INCH (TUBING) IMPLANT
UNDERPAD 30X30 (UNDERPADS AND DIAPERS) IMPLANT

## 2015-03-01 NOTE — Op Note (Signed)
Dictation Number [id number]

## 2015-03-01 NOTE — Anesthesia Postprocedure Evaluation (Signed)
  Anesthesia Post-op Note  Patient: Lisa Ewing  Procedure(s) Performed: Procedure(s) with comments: RIGHT TRAPEZIUM EXCISION (Right) - ANESTHESIA:  CHOICE, REGIONAL BLOCK RIGHT SUSPENSION PLASTY (Right) RIGHT ABDUCTUS POLICUS LONGUS TRANSFER (SUSPENSION PLASTY) (Right)  Patient Location: PACU  Anesthesia Type:General  Level of Consciousness: awake, alert , oriented and patient cooperative  Airway and Oxygen Therapy: Patient Spontanous Breathing and Patient connected to nasal cannula oxygen  Post-op Pain: mild  Post-op Assessment: Post-op Vital signs reviewed, Patient's Cardiovascular Status Stable, Respiratory Function Stable, Patent Airway, No signs of Nausea or vomiting, Adequate PO intake and Pain level controlled, yet continues to require [place] O2 despite aggressive pulm toilet              Post-op Vital Signs: Reviewed and stable on [place] O2  Last Vitals:  Filed Vitals:   03/01/15 1600  BP:   Pulse: 112  Temp:   Resp:     Complications: unexpected post-op hospitalization ([facility] overnight observation)

## 2015-03-01 NOTE — Progress Notes (Signed)
Incentive spirometer taught to pt and she is doing well using it at this time.

## 2015-03-01 NOTE — Discharge Instructions (Addendum)

## 2015-03-01 NOTE — Brief Op Note (Signed)
03/01/2015  10:27 AM  PATIENT:  Deniece Portela  59 y.o. female  PRE-OPERATIVE DIAGNOSIS:  CARPEL METACARPAL ARTHRITIS RIGHT THUMB  POST-OPERATIVE DIAGNOSIS:  CARPEL METACARPAL ARTHRITIS RIGHT THUMB  PROCEDURE:  Procedure(s) with comments: RIGHT TRAPEZIUM EXCISION (Right) - ANESTHESIA:  CHOICE, REGIONAL BLOCK RIGHT SUSPENSION PLASTY (Right) RIGHT ABDUCTUS POLICUS LONGUS TRANSFER (SUSPENSION PLASTY) (Right)  SURGEON:  Surgeon(s) and Role:    * Daryll Brod, MD - Primary  PHYSICIAN ASSISTANT:   ASSISTANTS: none   ANESTHESIA:   local, regional and general  EBL:  Total I/O In: 1950 [I.V.:1200; IV Piggyback:750] Out: -   BLOOD ADMINISTERED:none  DRAINS: none   LOCAL MEDICATIONS USED:  BUPIVICAINE   SPECIMEN:  No Specimen  DISPOSITION OF SPECIMEN:  N/A  COUNTS:  YES  TOURNIQUET:   Total Tourniquet Time Documented: Upper Arm (Right) - 7 minutes Upper Arm (Right) - 66 minutes Total: Upper Arm (Right) - 73 minutes   DICTATION: .Other Dictation: Dictation Number [id number]  PLAN OF CARE: Discharge to home after PACU  PATIENT DISPOSITION:  PACU - hemodynamically stable.

## 2015-03-01 NOTE — Transfer of Care (Signed)
Immediate Anesthesia Transfer of Care Note  Patient: Lisa Ewing  Procedure(s) Performed: Procedure(s) with comments: RIGHT TRAPEZIUM EXCISION (Right) - ANESTHESIA:  CHOICE, REGIONAL BLOCK RIGHT SUSPENSION PLASTY (Right) RIGHT ABDUCTUS POLICUS LONGUS TRANSFER (SUSPENSION PLASTY) (Right)  Patient Location: PACU  Anesthesia Type:General and GA combined with regional for post-op pain  Level of Consciousness: sedated  Airway & Oxygen Therapy: Patient Spontanous Breathing and Patient connected to face mask oxygen  Post-op Assessment: Report given to RN and Post -op Vital signs reviewed and stable  Post vital signs: Reviewed and stable  Last Vitals:  Filed Vitals:   03/01/15 1030  BP:   Pulse: 124  Temp:   Resp: 21    Complications: No apparent anesthesia complications

## 2015-03-01 NOTE — Progress Notes (Signed)
Dr. Glennon Mac in to assess pt and would like to keep pt overnight to observe.  Dr. Fredna Dow called and will place orders for overnight stay.

## 2015-03-01 NOTE — Progress Notes (Signed)
Dr crews back in to evaluate pt more awake productive cough better use of IS at present.  Pt voided on small amount in bedpan.  Taking some po at present. States feels better at present good sleep.  O2 removed and sat still at 88 but pt remains alert oriented and awake.

## 2015-03-01 NOTE — Progress Notes (Signed)
Dr crews made aware of sat down to 86  To 88 off o2. States incourage pt to wake up cough and deep breath.  Use IS with teach back.  Pt would take deep breath used IS poorly.  Would fall back to sleep.

## 2015-03-01 NOTE — Progress Notes (Signed)
Dr. Al Corpus to bedside to evaluate pt and feels pt should be able to go home at some point today.  Daughter was given update on pt and is aware of status.

## 2015-03-01 NOTE — Anesthesia Procedure Notes (Addendum)
Procedure Name: LMA Insertion Date/Time: 03/01/2015 8:45 AM Performed by: Lieutenant Diego Pre-anesthesia Checklist: Patient identified, Emergency Drugs available, Suction available and Patient being monitored Patient Re-evaluated:Patient Re-evaluated prior to inductionOxygen Delivery Method: Circle System Utilized Preoxygenation: Pre-oxygenation with 100% oxygen Intubation Type: IV induction Ventilation: Mask ventilation without difficulty LMA: LMA inserted LMA Size: 4.0 Number of attempts: 1 Airway Equipment and Method: Bite block Placement Confirmation: positive ETCO2 and breath sounds checked- equal and bilateral Tube secured with: Tape Dental Injury: Teeth and Oropharynx as per pre-operative assessment     Attempted R SCB but was unable to safely inject due to excessive movement around the artery of adjoining tissue.  May be due to having had a total thyroidectomy.

## 2015-03-01 NOTE — Progress Notes (Signed)
On admission, O2 sat 83%.  3 RNs present and CRNA at side placing nasal airway.  Coarse upper airway breath sounds, thick yellow mucous noted from upper airway.  Jaw thrust and chin lift performed.  Ambu bag used to maintain sats above 90%.  Pt. Eventually reposioned to semi fowlers position.  Ambu changed to non-rebreather mask.  Upper airway cleared on auscultation at 1100.Sats maintained to 93% at 1114.  Refer to flowsheet regarding vitals and LOC.Pt. Denies discomfort . 1115 pt appears to be resting comfortably on 3 liters.  Sats maintained at 93%.

## 2015-03-01 NOTE — H&P (Signed)
Lisa Ewing is a 59 year old female. She is complaining of pain in both hands. She localizes this t o the basal area of the thumbs. This began in 2009 and has persisted despite repeated conservative treatment including multiple steroid injections, bracing and anti-inflammatory medicines. She complains of an increasing throbbing aching pain with use, becoming more constant in nature. She is disabled. She is a diabetic. Allergies : latex, oxycodone, penicillin, codeine, metoclopramide, pregabalin, wellbutrin, sulfonamide  Past surgery: abdominal, hysterectomy, thyroid, back, cervical cancer  Family history: diabetes , HBP, arthritis, heart disease Social history:  smoke- 1ppd       ETOH:  neg ROS: cancer, vision problems, hearing loss, asthma, stomach ulcer, depression Lisa Ewing is an 59 y.o. female.   Chief Complaint: Pain right wrist HPI: see above  Past Medical History  Diagnosis Date  . Barrett esophagus     gives h/o diagnosed elsewhere. Two negative biopsies in 2011 however.  . Chronic pain     from [place] in 2003  . Back fracture   . Broken hip     right  . Bursitis of left hip   . Sciatica   . Cervicalgia   . Lumbago   . Disorder of sacroiliac joint   . Pain in joint, upper arm   . Trochanteric bursitis   . 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 Pain in joint, pelvic region and thigh   . Hx of cervical cancer     age 69  . History of uterine cancer 1998    hysterectomy  . Hyperplastic colon polyp   . Vitamin D deficiency   . Shingles   . UTI (lower urinary tract infection)   . Allergic rhinitis   . Asthma   . COPD (chronic obstructive pulmonary disease)   . Depression   . Diabetes mellitus without complication   . GERD (gastroesophageal reflux disease)   . Hyperlipidemia   . Osteoporosis   . Hypothyroid 07/11/2013  . Smoker   . TMJ (dislocation of temporomandibular joint)   . Sleep apnea     pt sts i do not use because "the rubber bothers me".  . Sleep apnea    . Short-term memory loss     Past Surgical History  Procedure Laterality Date  . S/p hysterectomy  1999    with BSO  . Thyroidectomy  2007    for large benign tumors  . Rotator cuff repair Right 2006  . Thumb surgery  2010    left-removal of tumor  . Esophagogastroduodenoscopy  05/02/2010    ZOX:WRUEA hiatal hernia, endoscopically looked like barretts esophagus but NEG biopsy  . Colonoscopy  05/02/2010    [id number] elongated colon. multiple left colon polyps. Next TCS 04/2015  . Abdominal hysterectomy    . Hip surgery Right   . Back surgery  1995    L-5 and S1  . Diagnostic laparoscopy    . Colonoscopy with propofol N/A 02/12/2015    Procedure: COLONOSCOPY WITH PROPOFOL;  Surgeon: Daneil Dolin, MD;  Location: [facility];  Service: Endoscopy;  Laterality: N/A;  In cecum @ 0818, out @ 0833  . Esophagogastroduodenoscopy (egd) with propofol N/A 02/12/2015    Procedure: ESOPHAGOGASTRODUODENOSCOPY (EGD) WITH PROPOFOL;  Surgeon: Daneil Dolin, MD;  Location: [facility];  Service: Endoscopy;  Laterality: N/A;  . Esophageal dilation N/A 02/12/2015    Procedure: ESOPHAGEAL DILATION;  Surgeon: Daneil Dolin, MD;  Location: [facility];  Service: Endoscopy;  Laterality: N/AKrista Blue #  [id number]  . Esophageal biopsy N/A 02/12/2015    Procedure: BIOPSY;  Surgeon: Daneil Dolin, MD;  Location: [facility];  Service: Endoscopy;  Laterality: N/A;  . Polypectomy N/A 02/12/2015    Procedure: POLYPECTOMY;  Surgeon: Daneil Dolin, MD;  Location: [facility];  Service: Endoscopy;  Laterality: N/A;  sigmoid polyps    Family History  Problem Relation Age of Onset  . Breast cancer Maternal Aunt   . Throat cancer Maternal Grandmother   . Hyperlipidemia Mother   . Diabetes Father   . Colon cancer Neg Hx    Social History:  reports that she has been smoking Cigarettes.  She has a 42 pack-year smoking history. She has never used smokeless tobacco. She reports that she drinks alcohol. She reports that she does not use illicit  drugs.  Allergies:  Allergies  Allergen Reactions  . Bee Venom Anaphylaxis  . Latex Shortness Of Breath  . Oxycodone-Acetaminophen Hives and Shortness Of Breath    Upset Stomach  . Penicillins Anaphylaxis    Throat swells   . Shellfish Allergy Anaphylaxis    Throat swells   . Codeine Nausea Only  . Metoclopramide Hcl     Doesn't recall type of reaction  . Other Swelling    Almonds. Bananas cause an asthma attack.   . Pregabalin Swelling  . Sulfonamide Derivatives     Tongue swells   . Adhesive [Tape] Rash  . Wellbutrin [Bupropion] Rash    Hives, red whelps    Medications Prior to Admission  Medication Sig Dispense Refill  . ACCU-CHEK AVIVA PLUS test strip USE TO TEST BLOOD SUGAR ONCE DAILY 50 each 3  . ACCU-CHEK FASTCLIX LANCETS MISC USE TO TEST BLOOD SUGAR ONCE DAILY 102 each 3  . acyclovir (ZOVIRAX) 400 MG tablet Take 1 tablet (400 mg total) by mouth daily. 30 tablet 5  . ADVAIR DISKUS 250-50 MCG/DOSE AEPB TAKE 1 INHALATION BY MOUTH TWICE DAILY.  RINSE MOUTH AFTER USE. 60 each 2  . atorvastatin (LIPITOR) 40 MG tablet TAKE 1 TABLET BY MOUTH EVERY DAY 30 tablet 5  . calcitRIOL (ROCALTROL) 0.25 MCG capsule TAKE ONE CAPSULE BY MOUTH ONCE DAILY 30 capsule 5  . calcium-vitamin D (OSCAL-500) 500-400 MG-UNIT per tablet Take 1 tablet by mouth 2 (two) times daily. 90 tablet 5  . cetirizine (ZYRTEC) 10 MG tablet TAKE ONE TABLET BY MOUTH DAILY FOR ALLERGIES 30 tablet 5  . Cholecalciferol (VITAMIN D) 2000 UNITS CAPS Take 1 capsule (2,000 Units total) by mouth daily. 30 capsule 5  . diclofenac sodium (VOLTAREN) 1 % GEL Apply 2 g topically 4 (four) times daily. 1 Tube 2  . EPIPEN 2-PAK 0.3 MG/0.3ML SOAJ injection INJECT 1 PEN INTRAMUSCULARLY AS NEEDED FOR ALLERGIC REACTION 2 Device 1  . escitalopram (LEXAPRO) 20 MG tablet Take 1 tablet (20 mg total) by mouth daily. 30 tablet 5  . ezetimibe (ZETIA) 10 MG tablet Take 1 tablet (10 mg total) by mouth daily. 90 tablet 3  . furosemide  (LASIX) 20 MG tablet Take 1 tablet (20 mg total) by mouth daily. 30 tablet 5  . gabapentin (NEURONTIN) 300 MG capsule TAKE 1 CAPSULE BY MOUTH THREE TIMES A DAY AND TAKE 2 CAPSULES EVERY NIGHT AT BEDTIME 150 capsule 2  . HYDROcodone-acetaminophen (NORCO) 10-325 MG per tablet Take 1 tablet by mouth 3 (three) times daily. 90 tablet 0  . hyoscyamine (LEVSIN SL) 0.125 MG SL tablet PLACE 1 TABLET UNDER THE TONGUE EVERY 4 HOURS AS NEEDED 90  tablet 1  . levothyroxine (SYNTHROID, LEVOTHROID) 137 MCG tablet Take 1 tablet (137 mcg total) by mouth daily before breakfast. 30 tablet 5  . metFORMIN (GLUCOPHAGE) 1000 MG tablet Take 1 tablet (1,000 mg total) by mouth 2 (two) times daily with a meal. 60 tablet 3  . methocarbamol (ROBAXIN) 500 MG tablet Take 1 tablet (500 mg total) by mouth 3 (three) times daily. 45 tablet 0  . montelukast (SINGULAIR) 10 MG tablet TAKE ONE TABLET BY MOUTH AT BEDTIME 30 tablet 4  . niacin (NIASPAN) 1000 MG CR tablet TAKE 2 TABLETS BY MOUTH AT BEDTIME 60 tablet 5  . nystatin (MYCOSTATIN) 100000 UNIT/ML suspension Take 5 mLs (500,000 Units total) by mouth 4 (four) times daily. 473 mL 1  . pantoprazole (PROTONIX) 40 MG tablet Take 1 tablet (40 mg total) by mouth daily. 30 tablet 11  . PROVENTIL HFA 108 (90 BASE) MCG/ACT inhaler USE 2 PUFFS EVERY 4 TO 6 HOURS AS NEEDED FOR SHORTNESS OF BREATH 6.7 g 3  . raloxifene (EVISTA) 60 MG tablet TAKE ONE TABLET BY MOUTH DAILY 30 tablet 2  . sitaGLIPtin (JANUVIA) 100 MG tablet Take 1 tablet (100 mg total) by mouth daily. 30 tablet 5  . solifenacin (VESICARE) 5 MG tablet Take 1 tablet (5 mg total) by mouth daily. 30 tablet 5  . sucralfate (CARAFATE) 1 G tablet TAKE 1 TABLET BY MOUTH 4 TIMES A DAY WITH MEALS AND AT BEDTIME 120 tablet 3  . traZODone (DESYREL) 100 MG tablet Take 1 tablet (100 mg total) by mouth at bedtime. 30 tablet 2  . triamcinolone cream (KENALOG) 0.1 % Apply 1 application topically 2 (two) times daily. 30 g 0  . TRILIPIX 135 MG  capsule TAKE 1 CAPSULE BY MOUTH EVERY DAY 30 capsule 5  . ipratropium-albuterol (DUONEB) 0.5-2.5 (3) MG/3ML SOLN INHALE THE CONTENTS OF ONE VIAL VIA NEBULIZER BY MOUTH FOUR TIMES A DAY AS NEEDED FOR WHEEZING OR SHORTNESS OF BREATH 360 mL 1    Results for orders placed or performed during the hospital encounter of 03/01/15 (from the past 48 hour(s))  I-STAT, chem 8     Status: Abnormal   Collection Time: 03/01/15  7:39 AM  Result Value Ref Range   Sodium 139 135 - 145 mmol/L   Potassium 4.1 3.5 - 5.1 mmol/L   Chloride 100 (L) 101 - 111 mmol/L   BUN 16 6 - 20 mg/dL   Creatinine, Ser 0.80 0.44 - 1.00 mg/dL   Glucose, Bld 167 (H) 65 - 99 mg/dL   Calcium, Ion 1.07 (L) 1.12 - 1.23 mmol/L   TCO2 24 0 - 100 mmol/L   Hemoglobin 16.0 (H) 12.0 - 15.0 g/dL   HCT 47.0 (H) 36.0 - 46.0 %    No results found.   Pertinent items are noted in HPI.  Blood pressure 137/69, pulse 98, temperature 98.5 F (36.9 C), temperature source Oral, resp. rate 15, height 5\' 5"  (1.651 m), weight 115.667 kg (255 lb), SpO2 98 %.  General appearance: alert, cooperative and appears stated age Head: Normocephalic, without obvious abnormality Neck: no JVD Resp: clear to auscultation bilaterally Cardio: regular rate and rhythm, S1, S2 normal, no murmur, click, rub or gallop GI: soft non tender Extremities: pain right wrist Pulses: 2+ and symmetric Skin: Skin color, texture, turgor normal. No rashes or lesions Neurologic: Grossly normal Incision/Wound: na  Assessment/Plan Diagnosis: CMC arthritis right thumb  Plan: suspensionplasty right thumb: trapezium excision with APL tendon transfer She is aware there is  no guarantee with the surgery, possibility of infection, recurrence, injury to arteries, nerves, tendons, incomplete relief of symptoms and dystrophy.  She is advised this can be done as an outpatient under regional anesthesia.  The pre, peri and postoperative course were discussed along with the risks and  complications.   Karmin Kasprzak R 03/01/2015, 8:29 AM

## 2015-03-01 NOTE — Anesthesia Preprocedure Evaluation (Signed)
Anesthesia Evaluation  Patient identified by MRN, date of birth, ID band Patient awake    Reviewed: Allergy & Precautions, NPO status , Patient's Chart, lab work & pertinent test results  Airway Mallampati: I  TM Distance: >3 FB Neck ROM: Full    Dental  (+) Teeth Intact, Dental Advisory Given   Pulmonary asthma , sleep apnea and Continuous Positive Airway Pressure Ventilation , COPDCurrent Smoker,  breath sounds clear to auscultation        Cardiovascular Rhythm:Regular Rate:Normal     Neuro/Psych    GI/Hepatic GERD-  Medicated and Controlled,  Endo/Other  diabetes, Well Controlled, Type 2, Oral Hypoglycemic AgentsMorbid obesity  Renal/GU      Musculoskeletal   Abdominal   Peds  Hematology   Anesthesia Other Findings   Reproductive/Obstetrics                             Anesthesia Physical Anesthesia Plan  ASA: III  Anesthesia Plan: General   Post-op Pain Management:    Induction: Intravenous  Airway Management Planned: Oral ETT  Additional Equipment:   Intra-op Plan:   Post-operative Plan: Extubation in OR  Informed Consent: I have reviewed the patients History and Physical, chart, labs and discussed the procedure including the risks, benefits and alternatives for the proposed anesthesia with the patient or authorized representative who has indicated his/her understanding and acceptance.   Dental advisory given  Plan Discussed with: CRNA, Anesthesiologist and Surgeon  Anesthesia Plan Comments:         Anesthesia Quick Evaluation

## 2015-03-01 NOTE — Progress Notes (Signed)
Attempted pre op block by Dr Al Corpus; unsuccessful

## 2015-03-01 NOTE — Progress Notes (Signed)
Dr Al Corpus in pt more awake, sat still down to 88 but pt remains unchanged   More teaching with IS and productive white sputum with coughing,  Skin warm and dry  Color good at present.

## 2015-03-01 NOTE — Progress Notes (Signed)
At this time pt is obeying commands but not talking.

## 2015-03-02 ENCOUNTER — Telehealth: Payer: Self-pay | Admitting: Family Medicine

## 2015-03-02 ENCOUNTER — Encounter (HOSPITAL_BASED_OUTPATIENT_CLINIC_OR_DEPARTMENT_OTHER): Payer: Self-pay | Admitting: Orthopedic Surgery

## 2015-03-02 ENCOUNTER — Encounter: Payer: Self-pay | Admitting: Internal Medicine

## 2015-03-02 DIAGNOSIS — M1811 Unilateral primary osteoarthritis of first carpometacarpal joint, right hand: Secondary | ICD-10-CM | POA: Diagnosis not present

## 2015-03-02 LAB — GLUCOSE, CAPILLARY: GLUCOSE-CAPILLARY: 202 mg/dL — AB (ref 65–99)

## 2015-03-02 MED ORDER — UNABLE TO FIND: Status: DC

## 2015-03-02 MED ORDER — ACCU-CHEK FASTCLIX LANCETS MISC: 1.0000 | Freq: Every day | Status: DC

## 2015-03-02 MED ORDER — GLUCOSE BLOOD VI STRP: 1.0000 | ORAL_STRIP | Freq: Every day | Status: DC

## 2015-03-02 NOTE — Telephone Encounter (Signed)
[facility] sent a request for incontinent supplies and diabetic strips/lancets.  Orders signed by provider and faxed back

## 2015-03-02 NOTE — Telephone Encounter (Signed)
[facility]   Patient is calling to get "pads" because she has urine issues to lay on her bed when she cant make it to the bathroom, she says they are long pads  Please call her at [phone]

## 2015-03-02 NOTE — Op Note (Signed)
Lisa Ewing, Lisa Ewing                ACCOUNT NO.:  [id number]  MEDICAL RECORD NO.:  [id number]  LOCATION:                               FACILITY:  [facility]  PHYSICIAN:  Daryll Brod, M.D.       DATE OF BIRTH:  [birth year]  DATE OF PROCEDURE:  03/01/2015 DATE OF DISCHARGE:                              OPERATIVE REPORT   PREOPERATIVE DIAGNOSIS:  Carpometacarpal arthritis, right thumb.  POSTOPERATIVE DIAGNOSIS:  Carpometacarpal arthritis, right thumb.  OPERATION:  Excision of trapezium with abductor pollicis longus tendon transfer, right thumb.  SURGEON:  Daryll Brod, M.D.  ASSISTANT:  None.  ANESTHESIA:  Supraclavicular block, general, local infiltration.  ANESTHESIOLOGIST:  Lorrene Reid, M.D.  HISTORY:  The patient is a 59 year old female with a history of CMC arthritis.  This is not responded to multiple blocks, injections of steroid, splinting, anti-inflammatories.  She has elected to undergo surgical reconstruction with excision of the trapezium, transfer suspensionplasty using adductor pollicis longus.  Pre, peri, and postoperative course have been discussed along with risks and complications.  She is aware that there is no guarantee with the surgery; possibility of infection; recurrence of injury to arteries, nerves, tendons; incomplete relief of symptoms and dystrophy.  In the preoperative area, the patient was seen, the extremity marked by both the patient and surgeon, and antibiotic given.  DESCRIPTION OF PROCEDURE:  The patient was brought to the operating room where a supraclavicular block had been given in the preoperative area. A general anesthetic was carried out.  She was prepped using ChloraPrep, supine position with the right arm free.  A tourniquet placed on the upper arm was inflated to 275 mmHg after exsanguination of the limb with an Esmarch bandage.  Time-out was taken, confirming the patient and procedure.  The incision was made, significant bleeding was noted.   A sterile tourniquet was then placed on the forearm, rewrapped, the upper tourniquet deflated and this tourniquet inflated to 275 mmHg on the forearm.  This provided adequate hemostasis.  Bleeders were electrocauterized with bipolar.  The radial sensory nerve was identified and protected.  The dissection carried down to the interval between the extensor pollicis brevis and abductor pollicis longus.  An incision was made, identifying the [employer] joint.  The radial artery was identified proximally.  The trapezium was then isolated with a longitudinal incision through the capsule.  The capsule periosteum was then elevated radially and ulnarly.  The bone was then morselized and removed in pieces and removing in toto.  The most dorsal aspect of the abductor pollicis longus was then identified, this was left attached to the insertion at the base of the metacarpal.  A separate incision was then made proximally, this was then harvested with a 28 or 30-gauge monofilament wire using the cheese cutter after placement of a [employer] through the first dorsal compartment.  This allowed isolation, the tendon cut at the musculotendinous junction and left attached distally.  Drill holes were then made in the proximal aspect of the thumb metacarpal.  This was done from the dorsal radial to the volar ulnar aspect, this was enlarged.  Second hole was then made in the base  of the index metacarpal, bringing it out in the intermetacarpal space. Incision was made over this area, allowing identification of the drill hole.  The abductor pollicis longus tendon was then passed through the base of the index metacarpal radial to ulnar and then through the base of the index from radial to ulnar dorsal, this was then brought across the dorsum of the metacarpal after bluntly dissecting this and protect the radial artery and brought down through the capsule of the old [facility] joint.  This was then sutured to the  base of the metacarpal where the abductor pollicis longus exited from the thumb metacarpal with figure-of- eight 3-0 FiberWire in a figure-of-eight manner, this firmly fixed the tendon at that point in time.  It was further fixed to the insertion of the abductor pollicis longus with the FiberWire, brought back to the capsule and reinserted into the capsule and sutured with the FiberWire. The wounds were copiously irrigated with saline.  The capsule was then closed with interrupted 4-0 Vicryl sutures, the subcutaneous tissue with interrupted 4-0 Vicryl and skin with interrupted 4-0 nylon sutures.  The area of harvest of the abductor pollicis longus proximally was closed in layers with 4-0 Vicryl and 4-0 nylon in the subcutaneous tissue and skin.  The exit of the wound for placement of the tendon graft on the ulnar aspect of the index metacarpal was closed with interrupted 4-0 nylon sutures.  Prior to the initiation of the surgery, each area of incision was injected with 0.25% bupivacaine without epinephrine, approximately 8 mL was used.  A sterile compressive dressing, thumb spica and dorsal palmar splint applied.  On deflation of the tourniquet, all fingers and thumb were immediately pinked.  She was taken to the recovery room for observation in satisfactory condition.  She will be discharged to home to return to the [place], on [address] [employer]          ______________________________ Daryll Brod, M.D.     GK/MEDQ  D:  03/01/2015  T:  03/01/2015  Job:  [id number]

## 2015-03-05 ENCOUNTER — Encounter: Payer: Self-pay | Admitting: Internal Medicine

## 2015-03-05 ENCOUNTER — Telehealth: Payer: Self-pay

## 2015-03-05 NOTE — Telephone Encounter (Signed)
Letter mailed to the pt. 

## 2015-03-05 NOTE — Telephone Encounter (Signed)
OV  Made and letter mailed

## 2015-03-05 NOTE — Telephone Encounter (Signed)
Per RMR-  Send letter to patient.  Send copy of letter with path to referring provider and PCP. Needs office visit with extender in about 3 months to reassess upper GI tract symptoms

## 2015-03-08 ENCOUNTER — Ambulatory Visit: Payer: Medicaid Other

## 2015-03-08 ENCOUNTER — Encounter: Payer: Medicaid Other | Attending: Physical Medicine and Rehabilitation

## 2015-03-08 ENCOUNTER — Ambulatory Visit (HOSPITAL_BASED_OUTPATIENT_CLINIC_OR_DEPARTMENT_OTHER): Payer: Medicaid Other | Admitting: Physical Medicine & Rehabilitation

## 2015-03-08 ENCOUNTER — Encounter: Payer: Self-pay | Admitting: Physical Medicine & Rehabilitation

## 2015-03-08 VITALS — BP 141/69 | HR 100 | Resp 14

## 2015-03-08 DIAGNOSIS — M961 Postlaminectomy syndrome, not elsewhere classified: Secondary | ICD-10-CM | POA: Diagnosis not present

## 2015-03-08 DIAGNOSIS — M79601 Pain in right arm: Secondary | ICD-10-CM

## 2015-03-08 DIAGNOSIS — K219 Gastro-esophageal reflux disease without esophagitis: Secondary | ICD-10-CM | POA: Insufficient documentation

## 2015-03-08 DIAGNOSIS — M25512 Pain in left shoulder: Secondary | ICD-10-CM | POA: Diagnosis not present

## 2015-03-08 DIAGNOSIS — M810 Age-related osteoporosis without current pathological fracture: Secondary | ICD-10-CM | POA: Insufficient documentation

## 2015-03-08 DIAGNOSIS — M47816 Spondylosis without myelopathy or radiculopathy, lumbar region: Secondary | ICD-10-CM | POA: Diagnosis not present

## 2015-03-08 DIAGNOSIS — G8929 Other chronic pain: Secondary | ICD-10-CM

## 2015-03-08 DIAGNOSIS — M545 Low back pain, unspecified: Secondary | ICD-10-CM

## 2015-03-08 DIAGNOSIS — M79604 Pain in right leg: Secondary | ICD-10-CM

## 2015-03-08 DIAGNOSIS — J449 Chronic obstructive pulmonary disease, unspecified: Secondary | ICD-10-CM | POA: Insufficient documentation

## 2015-03-08 DIAGNOSIS — E785 Hyperlipidemia, unspecified: Secondary | ICD-10-CM | POA: Insufficient documentation

## 2015-03-08 DIAGNOSIS — E039 Hypothyroidism, unspecified: Secondary | ICD-10-CM | POA: Diagnosis not present

## 2015-03-08 DIAGNOSIS — F17210 Nicotine dependence, cigarettes, uncomplicated: Secondary | ICD-10-CM | POA: Insufficient documentation

## 2015-03-08 DIAGNOSIS — M25551 Pain in right hip: Secondary | ICD-10-CM | POA: Diagnosis not present

## 2015-03-08 DIAGNOSIS — M47814 Spondylosis without myelopathy or radiculopathy, thoracic region: Secondary | ICD-10-CM | POA: Diagnosis not present

## 2015-03-08 DIAGNOSIS — E119 Type 2 diabetes mellitus without complications: Secondary | ICD-10-CM | POA: Diagnosis not present

## 2015-03-08 NOTE — Patient Instructions (Signed)
Take 4-6 Nucynta per day for the next week and then switch back to the hydrocodone 3 times per day, next appointment in the middle of July with Zella Ball

## 2015-03-08 NOTE — Progress Notes (Signed)
Subjective:    Patient ID: Lisa Ewing, female    DOB: 01-Nov-1955, 59 y.o.   MRN: [id number]  HPI Had hand surgery Dr Fredna Dow, 1 week ago Doubled up on hydrocodone for post  Op pain.   Nucynta rx yesterday from surgery One more week in arm fiberglass cast  Pain Inventory Average Pain 8 Pain Right Now 8 My pain is constant, sharp, burning, stabbing and aching  In the last 24 hours, has pain interfered with the following? General activity 8 Relation with others 10 Enjoyment of life 6 What TIME of day is your pain at its worst? all Sleep (in general) Fair  Pain is worse with: walking, bending, sitting, standing and some activites Pain improves with: rest, heat/ice, medication and TENS Relief from Meds: 5  Mobility how many minutes can you walk? 0 ability to climb steps?  no do you drive?  no use a wheelchair needs help with transfers Do you have any goals in this area?  yes  Function disabled: date disabled . I need assistance with the following:  dressing, bathing, toileting, meal prep, household duties and shopping Do you have any goals in this area?  yes  Neuro/Psych bladder control problems numbness spasms dizziness depression anxiety  Prior Studies Any changes since last visit?  no  Physicians involved in your care Any changes since last visit?  no   Family History  Problem Relation Age of Onset  . Breast cancer Maternal Aunt   . Throat cancer Maternal Grandmother   . Hyperlipidemia Mother   . Diabetes Father   . Colon cancer Neg Hx    History   Social History  . Marital Status: Divorced    Spouse Name: N/A  . Number of Children: N/A  . Years of Education: N/A   Social History Main Topics  . Smoking status: Current Every Day Smoker -- 1.00 packs/day for 42 years    Types: Cigarettes  . Smokeless tobacco: Never Used  . Alcohol Use: 0.0 oz/week    0 Standard drinks or equivalent per week     Comment: cocktail once to twice a week to help  relax  . Drug Use: No  . Sexual Activity: No   Other Topics Concern  . None   Social History Narrative   Past Surgical History  Procedure Laterality Date  . S/p hysterectomy  1999    with BSO  . Thyroidectomy  2007    for large benign tumors  . Rotator cuff repair Right 2006  . Thumb surgery  2010    left-removal of tumor  . Esophagogastroduodenoscopy  05/02/2010    ZCH:YIFOY hiatal hernia, endoscopically looked like barretts esophagus but NEG biopsy  . Colonoscopy  05/02/2010    DXA:JOINOMVEH elongated colon. multiple left colon polyps. Next TCS 04/2015  . Abdominal hysterectomy    . Hip surgery Right   . Back surgery  1995    L-5 and S1  . Diagnostic laparoscopy    . Colonoscopy with propofol N/A 02/12/2015    Procedure: COLONOSCOPY WITH PROPOFOL;  Surgeon: Daneil Dolin, MD;  Location: [facility];  Service: Endoscopy;  Laterality: N/A;  In cecum @ 0818, out @ 0833  . Esophagogastroduodenoscopy (egd) with propofol N/A 02/12/2015    Procedure: ESOPHAGOGASTRODUODENOSCOPY (EGD) WITH PROPOFOL;  Surgeon: Daneil Dolin, MD;  Location: [facility];  Service: Endoscopy;  Laterality: N/A;  . Esophageal dilation N/A 02/12/2015    Procedure: ESOPHAGEAL DILATION;  Surgeon: Daneil Dolin, MD;  [facility]: [facility];  Service: Endoscopy;  Laterality: N/A;  [facility] # [id number]  . Esophageal biopsy N/A 02/12/2015    Procedure: BIOPSY;  Surgeon: Daneil Dolin, MD;  [facility];  Service: Endoscopy;  Laterality: N/A;  . Polypectomy N/A 02/12/2015    Procedure: POLYPECTOMY;  Surgeon: Daneil Dolin, MD;  Location: [facility];  Service: Endoscopy;  Laterality: N/A;  sigmoid polyps  . Carpal bone excision Right 03/01/2015    Procedure: RIGHT TRAPEZIUM EXCISION;  Surgeon: Daryll Brod, MD;  Location: [place];  Service: Orthopedics;  Laterality: Right;  ANESTHESIA:  CHOICE, REGIONAL BLOCK  . Carpometacarpel suspension plasty Right 03/01/2015    Procedure: RIGHT SUSPENSION PLASTY;  Surgeon: Daryll Brod, MD;   Location: [place];  Service: Orthopedics;  Laterality: Right;  . Tendon transfer Right 03/01/2015    Procedure: RIGHT ABDUCTUS POLICUS LONGUS TRANSFER (SUSPENSION PLASTY);  Surgeon: Daryll Brod, MD;  Location: [place];  Service: Orthopedics;  Laterality: Right;   Past Medical History  Diagnosis Date  . Barrett esophagus     gives h/o diagnosed elsewhere. Two negative biopsies in 2011 however.  . Chronic pain     from [place] in 2003  . Back fracture   . Broken hip     right  . Bursitis of left hip   . Sciatica   . Cervicalgia   . Lumbago   . Disorder of sacroiliac joint   . Pain in joint, upper arm   . Trochanteric bursitis   . 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 Pain in joint, pelvic region and thigh   . Hx of cervical cancer     age 36  . History of uterine cancer 1998    hysterectomy  . Hyperplastic colon polyp   . Vitamin D deficiency   . Shingles   . UTI (lower urinary tract infection)   . Allergic rhinitis   . Asthma   . COPD (chronic obstructive pulmonary disease)   . Depression   . Diabetes mellitus without complication   . GERD (gastroesophageal reflux disease)   . Hyperlipidemia   . Osteoporosis   . Hypothyroid 07/11/2013  . Smoker   . TMJ (dislocation of temporomandibular joint)   . Sleep apnea     pt sts i do not use because "the rubber bothers me".  . Sleep apnea   . Short-term memory loss    BP 141/69 mmHg  Pulse 100  Resp 14  SpO2 91%  Opioid Risk Score:   Fall Risk Score: High Fall Risk (>13 points)`1  Depression screen PHQ 2/9  Depression screen PHQ 2/9 11/24/2014  Decreased Interest 3  Down, Depressed, Hopeless 2  PHQ - 2 Score 5  Altered sleeping 2  Tired, decreased energy 3  Change in appetite 2  Feeling bad or failure about yourself  3  Trouble concentrating 1  Moving slowly or fidgety/restless 1  Suicidal thoughts 0  PHQ-9 Score 17     Review of Systems  Constitutional:       Night sweats    Respiratory: Positive for cough, shortness of breath and wheezing.   Gastrointestinal: Positive for abdominal pain.  Genitourinary:       Bladder control problems  Neurological: Positive for dizziness and numbness.       Spasms  Psychiatric/Behavioral: Positive for dysphoric mood.  All other systems reviewed and are negative.      Objective:   Physical Exam  Constitutional: She is oriented  to person, place, and time. She appears well-developed and well-nourished.  HENT:  Head: Normocephalic and atraumatic.  Eyes: Conjunctivae and EOM are normal. Pupils are equal, round, and reactive to light.  Musculoskeletal:       Right hip: She exhibits decreased range of motion and decreased strength.       Lumbar back: She exhibits decreased range of motion and tenderness.  Right upper extremity and short arm Fiberglass splint with Ace wrapping  Tenderness in the midthoracic area as well as the lumbar paraspinal area  Straight leg raise is essentially negative she does have pain with raising up the right lower extremity but this is more related to her chronic hip problems  Neurological: She is alert and oriented to person, place, and time.  3 minus/5 knee extensors ankle dorsi flexors plantar flexors pain related trace right hip flexion pain interference, 3 minus left hip flexion  Psychiatric: She has a normal mood and affect.  Nursing note and vitals reviewed.         Assessment & Plan:  1. Chronic pain syndrome multifocal, chronic low back pain and mid back pain as well as right lower extremity pain  Patient will switch to Nucynta 50 mg 4-6 times per day over the next week then switch back to hydrocodone Continue hydrocodone10 mg 3 times a day Patient's activity levels Overall very low, she could be more active however she limits her activity level based on fear of pain. Thoracic spine MRI was unremarkable in 2013 Lumbar MRI in 2013 demonstrated prior surgery hemilaminectomy at  L5-S1 on the right side. Small protrusion at L4-5 but no spinal stenosis. Small compression deformity L3 superior endplate    Right hip osteoarthritis status post replacement with chronic postoperative pain  Right radicular pain  and numbness  likely secondary to prior disc at L5-S1 corresponding at L5-S1 levels  Nurse practitioner visit in 2 weeks

## 2015-03-13 DIAGNOSIS — J441 Chronic obstructive pulmonary disease with (acute) exacerbation: Secondary | ICD-10-CM | POA: Diagnosis not present

## 2015-03-13 DIAGNOSIS — M13869 Other specified arthritis, unspecified knee: Secondary | ICD-10-CM | POA: Diagnosis not present

## 2015-03-13 DIAGNOSIS — M13859 Other specified arthritis, unspecified hip: Secondary | ICD-10-CM | POA: Diagnosis not present

## 2015-03-13 DIAGNOSIS — M79609 Pain in unspecified limb: Secondary | ICD-10-CM | POA: Diagnosis not present

## 2015-03-14 ENCOUNTER — Ambulatory Visit (INDEPENDENT_AMBULATORY_CARE_PROVIDER_SITE_OTHER): Payer: Medicaid Other | Admitting: Physician Assistant

## 2015-03-14 ENCOUNTER — Encounter: Payer: Self-pay | Admitting: Physician Assistant

## 2015-03-14 VITALS — BP 130/78 | HR 92 | Temp 99.4°F | Resp 18 | Ht 65.0 in

## 2015-03-14 DIAGNOSIS — R350 Frequency of micturition: Secondary | ICD-10-CM | POA: Diagnosis not present

## 2015-03-14 DIAGNOSIS — N3000 Acute cystitis without hematuria: Secondary | ICD-10-CM

## 2015-03-14 LAB — URINALYSIS, ROUTINE W REFLEX MICROSCOPIC
Glucose, UA: NEGATIVE mg/dL
HGB URINE DIPSTICK: NEGATIVE
KETONES UR: NEGATIVE mg/dL
Nitrite: NEGATIVE
PH: 6 (ref 5.0–8.0)
Specific Gravity, Urine: 1.025 (ref 1.005–1.030)
UROBILINOGEN UA: 0.2 mg/dL (ref 0.0–1.0)

## 2015-03-14 LAB — URINALYSIS, MICROSCOPIC ONLY
CASTS: NONE SEEN
CRYSTALS: NONE SEEN
RBC / HPF: NONE SEEN RBC/hpf (ref <3)

## 2015-03-14 MED ORDER — CIPROFLOXACIN HCL 500 MG PO TABS: 500.0000 mg | ORAL_TABLET | Freq: Two times a day (BID) | ORAL | Status: DC

## 2015-03-14 NOTE — Progress Notes (Signed)
Patient ID: Lisa Ewing MRN: [id number], DOB: [birth year], 59 y.o. Date of Encounter: 03/14/2015, 11:52 AM    Chief Complaint:  Chief Complaint  Patient presents with  . UTI?     HPI: 59 y.o. year old female in wheelchair. States that she is having symptoms that she usually has when she has UTI. Says that when she urinates-- she finishes urinating-- and feels like she still has to go. Still feels a lot of pressure. Says that she really is not having any burning when she urinates. Has had no new type of back pain and no fevers or chills. She has a cast on her right arm. She is right handed. Therefore is having a hard time wiping herself very well she says. Says that even her daughter has been helping with this. However patient says it has been difficult to wipe front to back and to use a new clean piece of toilet paper with each wipe. Contributing to her UTI. She was seen here with Dr. Buelah Manis for UTI on 02/07/15. Treated with Cipro 7 days. Culture was positive for UTI and positive for sensitivity to Cipro.  Patient states that that was the only UTI that she has had this year. Also states that she has not been on any other antibodies recently.     Home Meds:   Outpatient Prescriptions Prior to Visit  Medication Sig Dispense Refill  . ACCU-CHEK FASTCLIX LANCETS MISC 1 each by Other route daily. 100 each 5  . acyclovir (ZOVIRAX) 400 MG tablet Take 1 tablet (400 mg total) by mouth daily. 30 tablet 5  . ADVAIR DISKUS 250-50 MCG/DOSE AEPB TAKE 1 INHALATION BY MOUTH TWICE DAILY.  RINSE MOUTH AFTER USE. 60 each 2  . atorvastatin (LIPITOR) 40 MG tablet TAKE 1 TABLET BY MOUTH EVERY DAY 30 tablet 5  . calcitRIOL (ROCALTROL) 0.25 MCG capsule TAKE ONE CAPSULE BY MOUTH ONCE DAILY 30 capsule 5  . calcium-vitamin D (OSCAL-500) 500-400 MG-UNIT per tablet Take 1 tablet by mouth 2 (two) times daily. 90 tablet 5  . cetirizine (ZYRTEC) 10 MG tablet TAKE ONE TABLET BY MOUTH DAILY FOR ALLERGIES 30 tablet 5    . Cholecalciferol (VITAMIN D) 2000 UNITS CAPS Take 1 capsule (2,000 Units total) by mouth daily. 30 capsule 5  . diclofenac sodium (VOLTAREN) 1 % GEL Apply 2 g topically 4 (four) times daily. 1 Tube 2  . EPIPEN 2-PAK 0.3 MG/0.3ML SOAJ injection INJECT 1 PEN INTRAMUSCULARLY AS NEEDED FOR ALLERGIC REACTION 2 Device 1  . escitalopram (LEXAPRO) 20 MG tablet Take 1 tablet (20 mg total) by mouth daily. 30 tablet 5  . ezetimibe (ZETIA) 10 MG tablet Take 1 tablet (10 mg total) by mouth daily. 90 tablet 3  . furosemide (LASIX) 20 MG tablet Take 1 tablet (20 mg total) by mouth daily. 30 tablet 5  . gabapentin (NEURONTIN) 300 MG capsule TAKE 1 CAPSULE BY MOUTH THREE TIMES A DAY AND TAKE 2 CAPSULES EVERY NIGHT AT BEDTIME 150 capsule 2  . glucose blood (ACCU-CHEK AVIVA PLUS) test strip 1 each by Other route daily. Use as instructed 50 each 11  . HYDROcodone-acetaminophen (NORCO) 10-325 MG per tablet Take 1 tablet by mouth 3 (three) times daily. 90 tablet 0  . HYDROcodone-acetaminophen (NORCO) 10-325 MG per tablet Take 1 tablet by mouth every 6 (six) hours as needed. 30 tablet 0  . hyoscyamine (LEVSIN SL) 0.125 MG SL tablet PLACE 1 TABLET UNDER THE TONGUE EVERY 4 HOURS AS NEEDED 90  tablet 1  . ipratropium-albuterol (DUONEB) 0.5-2.5 (3) MG/3ML SOLN INHALE THE CONTENTS OF ONE VIAL VIA NEBULIZER BY MOUTH FOUR TIMES A DAY AS NEEDED FOR WHEEZING OR SHORTNESS OF BREATH 360 mL 1  . levothyroxine (SYNTHROID, LEVOTHROID) 137 MCG tablet Take 1 tablet (137 mcg total) by mouth daily before breakfast. 30 tablet 5  . metFORMIN (GLUCOPHAGE) 1000 MG tablet Take 1 tablet (1,000 mg total) by mouth 2 (two) times daily with a meal. 60 tablet 3  . methocarbamol (ROBAXIN) 500 MG tablet Take 1 tablet (500 mg total) by mouth 3 (three) times daily. 45 tablet 0  . montelukast (SINGULAIR) 10 MG tablet TAKE ONE TABLET BY MOUTH AT BEDTIME 30 tablet 4  . niacin (NIASPAN) 1000 MG CR tablet TAKE 2 TABLETS BY MOUTH AT BEDTIME 60 tablet 5  .  nystatin (MYCOSTATIN) 100000 UNIT/ML suspension Take 5 mLs (500,000 Units total) by mouth 4 (four) times daily. 473 mL 1  . pantoprazole (PROTONIX) 40 MG tablet Take 1 tablet (40 mg total) by mouth daily. 30 tablet 11  . PROVENTIL HFA 108 (90 BASE) MCG/ACT inhaler USE 2 PUFFS EVERY 4 TO 6 HOURS AS NEEDED FOR SHORTNESS OF BREATH 6.7 g 3  . raloxifene (EVISTA) 60 MG tablet TAKE ONE TABLET BY MOUTH DAILY 30 tablet 2  . sitaGLIPtin (JANUVIA) 100 MG tablet Take 1 tablet (100 mg total) by mouth daily. 30 tablet 5  . solifenacin (VESICARE) 5 MG tablet Take 1 tablet (5 mg total) by mouth daily. 30 tablet 5  . sucralfate (CARAFATE) 1 G tablet TAKE 1 TABLET BY MOUTH 4 TIMES A DAY WITH MEALS AND AT BEDTIME 120 tablet 3  . traZODone (DESYREL) 100 MG tablet Take 1 tablet (100 mg total) by mouth at bedtime. 30 tablet 2  . triamcinolone cream (KENALOG) 0.1 % Apply 1 application topically 2 (two) times daily. 30 g 0  . TRILIPIX 135 MG capsule TAKE 1 CAPSULE BY MOUTH EVERY DAY 30 capsule 5  . UNABLE TO FIND ULTRA PLUS PADS  2 bags of 42 monthly/prn refills 2 Package prn  . UNABLE TO FIND ULTIMATE PLUS PADS  1 bag monthly/prn refills 1 Package prn  . UNABLE TO FIND 16 INCH GRAB BAR 1 each 0  . UNABLE TO FIND UNDERPADS 1 Package prn   No facility-administered medications prior to visit.    Allergies:  Allergies  Allergen Reactions  . Bee Venom Anaphylaxis  . Latex Shortness Of Breath  . Oxycodone-Acetaminophen Hives and Shortness Of Breath    Upset Stomach  . Penicillins Anaphylaxis    Throat swells   . Shellfish Allergy Anaphylaxis    Throat swells   . Codeine Nausea Only  . Metoclopramide Hcl     Doesn't recall type of reaction  . Other Swelling    Almonds. Bananas cause an asthma attack.   . Pregabalin Swelling  . Sulfonamide Derivatives     Tongue swells   . Adhesive [Tape] Rash  . Wellbutrin [Bupropion] Rash    Hives, red whelps      Review of Systems: See HPI for pertinent ROS. All  other ROS negative.    Physical Exam: Blood pressure 130/78, pulse 92, temperature 99.4 F (37.4 C), temperature source Oral, resp. rate 18, height 5\' 5"  (1.651 m)., There is no weight on file to calculate BMI. General:  Obese WF. Appears in no acute distress. Neck: Supple. No thyromegaly. No lymphadenopathy. Lungs: Clear bilaterally to auscultation without wheezes, rales, or rhonchi. Breathing is unlabored.  Heart: Regular rhythm. No murmurs, rubs, or gallops. Abdomen: Soft, non-tender, non-distended with normoactive bowel sounds. No hepatomegaly. No rebound/guarding. No obvious abdominal masses. Msk:  Strength and tone normal for age. No costophrenic angle tenderness with percussion bilaterally. Extremities/Skin: Warm and dry. Neuro: Alert and oriented X 3. Moves all extremities spontaneously. Gait is normal. CNII-XII grossly in tact. Psych:  Responds to questions appropriately with a normal affect.     ASSESSMENT AND PLAN:  59 y.o. year old female with  1. Acute cystitis without hematuria - Urine culture - ciprofloxacin (CIPRO) 500 MG tablet; Take 1 tablet (500 mg total) by mouth 2 (two) times daily.  Dispense: 20 tablet; Refill: 0  2. Frequent urination - Urinalysis, Routine w reflex microscopic (not at [facility]) - Urine culture - ciprofloxacin (CIPRO) 500 MG tablet; Take 1 tablet (500 mg total) by mouth 2 (two) times daily.  Dispense: 20 tablet; Refill: 0   Signed, [address] [place], [place], [facility] 03/14/2015 11:52 AM

## 2015-03-17 LAB — URINE CULTURE: Colony Count: >=100000

## 2015-03-20 ENCOUNTER — Encounter: Payer: Medicaid Other | Attending: Physical Medicine and Rehabilitation

## 2015-03-20 ENCOUNTER — Other Ambulatory Visit: Payer: Self-pay | Admitting: Gastroenterology

## 2015-03-20 ENCOUNTER — Other Ambulatory Visit: Payer: Self-pay | Admitting: Family Medicine

## 2015-03-20 ENCOUNTER — Encounter: Payer: Self-pay | Admitting: Physical Medicine & Rehabilitation

## 2015-03-20 ENCOUNTER — Ambulatory Visit (HOSPITAL_BASED_OUTPATIENT_CLINIC_OR_DEPARTMENT_OTHER): Payer: Medicaid Other | Admitting: Physical Medicine & Rehabilitation

## 2015-03-20 VITALS — BP 157/86 | HR 95 | Resp 16

## 2015-03-20 DIAGNOSIS — M5416 Radiculopathy, lumbar region: Secondary | ICD-10-CM | POA: Diagnosis not present

## 2015-03-20 DIAGNOSIS — M961 Postlaminectomy syndrome, not elsewhere classified: Secondary | ICD-10-CM

## 2015-03-20 DIAGNOSIS — M7062 Trochanteric bursitis, left hip: Secondary | ICD-10-CM

## 2015-03-20 DIAGNOSIS — M810 Age-related osteoporosis without current pathological fracture: Secondary | ICD-10-CM | POA: Diagnosis not present

## 2015-03-20 DIAGNOSIS — G8929 Other chronic pain: Secondary | ICD-10-CM | POA: Insufficient documentation

## 2015-03-20 DIAGNOSIS — M47816 Spondylosis without myelopathy or radiculopathy, lumbar region: Secondary | ICD-10-CM | POA: Insufficient documentation

## 2015-03-20 DIAGNOSIS — M25551 Pain in right hip: Secondary | ICD-10-CM | POA: Insufficient documentation

## 2015-03-20 DIAGNOSIS — F17210 Nicotine dependence, cigarettes, uncomplicated: Secondary | ICD-10-CM | POA: Insufficient documentation

## 2015-03-20 DIAGNOSIS — E785 Hyperlipidemia, unspecified: Secondary | ICD-10-CM | POA: Diagnosis not present

## 2015-03-20 DIAGNOSIS — M25512 Pain in left shoulder: Secondary | ICD-10-CM | POA: Diagnosis not present

## 2015-03-20 DIAGNOSIS — J449 Chronic obstructive pulmonary disease, unspecified: Secondary | ICD-10-CM | POA: Insufficient documentation

## 2015-03-20 DIAGNOSIS — M79604 Pain in right leg: Secondary | ICD-10-CM | POA: Diagnosis not present

## 2015-03-20 DIAGNOSIS — E119 Type 2 diabetes mellitus without complications: Secondary | ICD-10-CM | POA: Insufficient documentation

## 2015-03-20 DIAGNOSIS — K219 Gastro-esophageal reflux disease without esophagitis: Secondary | ICD-10-CM | POA: Diagnosis not present

## 2015-03-20 DIAGNOSIS — M47814 Spondylosis without myelopathy or radiculopathy, thoracic region: Secondary | ICD-10-CM | POA: Insufficient documentation

## 2015-03-20 DIAGNOSIS — E039 Hypothyroidism, unspecified: Secondary | ICD-10-CM | POA: Insufficient documentation

## 2015-03-20 MED ORDER — HYDROCODONE-ACETAMINOPHEN 10-325 MG PO TABS: 1.0000 | ORAL_TABLET | Freq: Three times a day (TID) | ORAL | Status: DC

## 2015-03-20 NOTE — Telephone Encounter (Signed)
Medication refilled per protocol. 

## 2015-03-20 NOTE — Patient Instructions (Signed)
Resume hydrocodone 10mg  Three times a day, stop Nucynta

## 2015-03-20 NOTE — Progress Notes (Signed)
Subjective:    Patient ID: Lisa Ewing, female    DOB: 05/18/1956, 59 y.o.   MRN: [id number]  HPI Had hand surgery Dr Fredna Dow, 3 week ago    suspensionplasty right thumb: trapezium excision with APL tendon transfer  Nucynta rx not helpful from Ortho Will have splint from OT     Pain Inventory Average Pain 8 Pain Right Now 8 My pain is constant, sharp, burning, stabbing and aching  In the last 24 hours, has pain interfered with the following? General activity 8 Relation with others 10 Enjoyment of life 6 What TIME of day is your pain at its worst? all Sleep (in general) Fair  Pain is worse with: walking, bending, sitting, standing and some activites Pain improves with: rest, heat/ice, medication and TENS Relief from Meds: 5  Mobility how many minutes can you walk? 0 ability to climb steps?  no do you drive?  no use a wheelchair needs help with transfers  Function disabled: date disabled . I need assistance with the following:  dressing, bathing, toileting, meal prep, household duties and shopping  Neuro/Psych bladder control problems numbness spasms dizziness depression anxiety  Prior Studies Any changes since last visit?  no    Physicians involved in your care Any changes since last visit?  no   Family History  Problem Relation Age of Onset  . Breast cancer Maternal Aunt   . Throat cancer Maternal Grandmother   . Hyperlipidemia Mother   . Diabetes Father   . Colon cancer Neg Hx    History   Social History  . Marital Status: Divorced    Spouse Name: N/A  . Number of Children: N/A  . Years of Education: N/A   Social History Main Topics  . Smoking status: Current Every Day Smoker -- 1.00 packs/day for 42 years    Types: Cigarettes  . Smokeless tobacco: Never Used  . Alcohol Use: 0.0 oz/week    0 Standard drinks or equivalent per week     Comment: cocktail once to twice a week to help relax  . Drug Use: No  . Sexual Activity: No   Other  Topics Concern  . None   Social History Narrative   Past Surgical History  Procedure Laterality Date  . S/p hysterectomy  1999    with BSO  . Thyroidectomy  2007    for large benign tumors  . Rotator cuff repair Right 2006  . Thumb surgery  2010    left-removal of tumor  . Esophagogastroduodenoscopy  05/02/2010    XHB:ZJIRC hiatal hernia, endoscopically looked like barretts esophagus but NEG biopsy  . Colonoscopy  05/02/2010    [id number] elongated colon. multiple left colon polyps. Next TCS 04/2015  . Abdominal hysterectomy    . Hip surgery Right   . Back surgery  1995    L-5 and S1  . Diagnostic laparoscopy    . Colonoscopy with propofol N/A 02/12/2015    Procedure: COLONOSCOPY WITH PROPOFOL;  Surgeon: Daneil Dolin, MD;  [facility];  Service: Endoscopy;  Laterality: N/A;  In cecum @ 0818, out @ 0833  . Esophagogastroduodenoscopy (egd) with propofol N/A 02/12/2015    Procedure: ESOPHAGOGASTRODUODENOSCOPY (EGD) WITH PROPOFOL;  Surgeon: Daneil Dolin, MD;  [facility];  Service: Endoscopy;  Laterality: N/A;  . Esophageal dilation N/A 02/12/2015    Procedure: ESOPHAGEAL DILATION;  Surgeon: Daneil Dolin, MD;  [facility];  Service: Endoscopy;  Laterality: N/A;  [place] # 3  .  Esophageal biopsy N/A 02/12/2015    Procedure: BIOPSY;  Surgeon: Daneil Dolin, MD;  [facility];  Service: Endoscopy;  Laterality: N/A;  . Polypectomy N/A 02/12/2015    Procedure: POLYPECTOMY;  Surgeon: Daneil Dolin, MD;  [facility];  Service: Endoscopy;  Laterality: N/A;  sigmoid polyps  . Carpal bone excision Right 03/01/2015    Procedure: RIGHT TRAPEZIUM EXCISION;  Surgeon: Daryll Brod, MD;  Location: [place];  Service: Orthopedics;  Laterality: Right;  ANESTHESIA:  CHOICE, REGIONAL BLOCK  . Carpometacarpel suspension plasty Right 03/01/2015    Procedure: RIGHT SUSPENSION PLASTY;  Surgeon: Daryll Brod, MD;  Location: [place];  Service: Orthopedics;   Laterality: Right;  . Tendon transfer Right 03/01/2015    Procedure: RIGHT ABDUCTUS POLICUS LONGUS TRANSFER (SUSPENSION PLASTY);  Surgeon: Daryll Brod, MD;  Location: [place];  Service: Orthopedics;  Laterality: Right;   Past Medical History  Diagnosis Date  . Barrett esophagus     gives h/o diagnosed elsewhere. Two negative biopsies in 2011 however.  . Chronic pain     from [place] in 2003  . Back fracture   . Broken hip     right  . Bursitis of left hip   . Sciatica   . Cervicalgia   . Lumbago   . Disorder of sacroiliac joint   . Pain in joint, upper arm   . Trochanteric bursitis   . 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 Pain in joint, pelvic region and thigh   . Hx of cervical cancer     age 23  . History of uterine cancer 1998    hysterectomy  . Hyperplastic colon polyp   . Vitamin D deficiency   . Shingles   . UTI (lower urinary tract infection)   . Allergic rhinitis   . Asthma   . COPD (chronic obstructive pulmonary disease)   . Depression   . Diabetes mellitus without complication   . GERD (gastroesophageal reflux disease)   . Hyperlipidemia   . Osteoporosis   . Hypothyroid 07/11/2013  . Smoker   . TMJ (dislocation of temporomandibular joint)   . Sleep apnea     pt sts i do not use because "the rubber bothers me".  . Sleep apnea   . Short-term memory loss    BP 157/86 mmHg  Pulse 95  Resp 16  SpO2 99%  Opioid Risk Score:   Fall Risk Score: Moderate Fall Risk (6-13 points) (previously educated and given handout)`1  Depression screen PHQ 2/9  Depression screen PHQ 2/9 11/24/2014  Decreased Interest 3  Down, Depressed, Hopeless 2  PHQ - 2 Score 5  Altered sleeping 2  Tired, decreased energy 3  Change in appetite 2  Feeling bad or failure about yourself  3  Trouble concentrating 1  Moving slowly or fidgety/restless 1  Suicidal thoughts 0  PHQ-9 Score 17    Review of Systems  Genitourinary:       Bladder control problems    Musculoskeletal:       Spasms  Neurological: Positive for dizziness and numbness.  Psychiatric/Behavioral: Positive for dysphoric mood. The patient is nervous/anxious.   All other systems reviewed and are negative.      Objective:   Physical Exam  Constitutional: She is oriented to person, place, and time. She appears well-developed and well-nourished.  HENT:  Head: Normocephalic and atraumatic.  Eyes: Conjunctivae and EOM are normal. Pupils are equal, round, and reactive to  light.  Neck: Normal range of motion.  Neurological: She is alert and oriented to person, place, and time. A sensory deficit (Positive straight leg raising test on the right side) is present.  Positive straight leg raising test on the right side  Motor strength is difficult to assess secondary to pain inhibition in the right lower extremity. She grossly has antigravity plus strength Left lower extremity strength is 4+ in the hip flexor knee extensor ankle dorsiflexion. No evidence of sensory deficit in the left lower extremity  Psychiatric: She has a normal mood and affect.  Nursing note and vitals reviewed.   Decreased Right S1 sensation Tenderness palpation right greater than left trochanteric bursa     Assessment & Plan:  1. Chronic pain syndrome multifocal, chronic low back pain and mid back pain as well as right lower extremity pain   Continue hydrocodone10 mg 3 times a day . Thoracic spine MRI was unremarkable in 2013 Lumbar MRI in 2013 demonstrated prior surgery hemilaminectomy at L5-S1 on the right side. Small protrusion at L4-5 but no spinal stenosis. Small compression deformity L3 superior endplate  Right hip osteoarthritis status post replacement with chronic postoperative pain  Right radicular pain  and numbness  likely secondary to prior disc at L5-S1 mainly Right S1 levels  Nurse practitioner visit in 4 weeks

## 2015-03-29 ENCOUNTER — Encounter: Payer: Self-pay | Admitting: Occupational Therapy

## 2015-03-29 ENCOUNTER — Ambulatory Visit: Payer: Medicaid Other | Attending: Orthopedic Surgery | Admitting: Occupational Therapy

## 2015-03-29 DIAGNOSIS — M79641 Pain in right hand: Secondary | ICD-10-CM

## 2015-03-29 DIAGNOSIS — R29898 Other symptoms and signs involving the musculoskeletal system: Secondary | ICD-10-CM

## 2015-03-29 DIAGNOSIS — M6289 Other specified disorders of muscle: Secondary | ICD-10-CM | POA: Insufficient documentation

## 2015-03-29 DIAGNOSIS — M25641 Stiffness of right hand, not elsewhere classified: Secondary | ICD-10-CM | POA: Diagnosis present

## 2015-03-29 NOTE — Therapy (Signed)
[facility] [address] [place], [place], [address] Phone: [phone]   Fax:  [phone]  Occupational Therapy Evaluation  Patient Details  Name: Lisa Ewing MRN: [id number] Date of Birth: 12-05-1955 Referring Provider:  Daryll Brod, MD  Encounter Date: 03/29/2015      OT End of Session - 03/29/15 1124    Visit Number 1   Authorization Type MCD   Authorization Time Period Awaiting authorization   OT Start Time 4970   OT Stop Time 1105   OT Time Calculation (min) 50 min   Equipment Utilized During Treatment splint   Activity Tolerance Patient tolerated treatment well      Past Medical History  Diagnosis Date  . Barrett esophagus     gives h/o diagnosed elsewhere. Two negative biopsies in 2011 however.  . Chronic pain     from [place] in 2003  . Back fracture   . Broken hip     right  . Bursitis of left hip   . Sciatica   . Cervicalgia   . Lumbago   . Disorder of sacroiliac joint   . Pain in joint, upper arm   . Trochanteric bursitis   . 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 Pain in joint, pelvic region and thigh   . Hx of cervical cancer     age 59  . History of uterine cancer 1998    hysterectomy  . Hyperplastic colon polyp   . Vitamin D deficiency   . Shingles   . UTI (lower urinary tract infection)   . Allergic rhinitis   . Asthma   . COPD (chronic obstructive pulmonary disease)   . Depression   . Diabetes mellitus without complication   . GERD (gastroesophageal reflux disease)   . Hyperlipidemia   . Osteoporosis   . Hypothyroid 07/11/2013  . Smoker   . TMJ (dislocation of temporomandibular joint)   . Sleep apnea     pt sts i do not use because "the rubber bothers me".  . Sleep apnea   . Short-term memory loss     Past Surgical History  Procedure Laterality Date  . S/p hysterectomy  1999    with BSO  . Thyroidectomy  2007    for large benign tumors  . Rotator cuff repair  Right 2006  . Thumb surgery  2010    left-removal of tumor  . Esophagogastroduodenoscopy  05/02/2010    YOV:ZCHYI hiatal hernia, endoscopically looked like barretts esophagus but NEG biopsy  . Colonoscopy  05/02/2010    FOY:DXAJOINOM elongated colon. multiple left colon polyps. Next TCS 04/2015  . Abdominal hysterectomy    . Hip surgery Right   . Back surgery  1995    L-5 and S1  . Diagnostic laparoscopy    . Colonoscopy with propofol N/A 02/12/2015    Procedure: COLONOSCOPY WITH PROPOFOL;  Surgeon: Daneil Dolin, MD;  Location: [facility];  Service: Endoscopy;  Laterality: N/A;  In cecum @ 0818, out @ 0833  . Esophagogastroduodenoscopy (egd) with propofol N/A 02/12/2015    Procedure: ESOPHAGOGASTRODUODENOSCOPY (EGD) WITH PROPOFOL;  Surgeon: Daneil Dolin, MD;  Location: [facility];  Service: Endoscopy;  Laterality: N/A;  . Esophageal dilation N/A 02/12/2015    Procedure: ESOPHAGEAL DILATION;  Surgeon: Daneil Dolin, MD;  Location: [facility];  Service: Endoscopy;  Laterality: N/A;  [place] # 79  . Esophageal biopsy N/A 02/12/2015    Procedure: BIOPSY;  Surgeon: Daneil Dolin, MD;  Location: [facility];  Service: Endoscopy;  Laterality: N/A;  . Polypectomy N/A 02/12/2015    Procedure: POLYPECTOMY;  Surgeon: Daneil Dolin, MD;  Location: [facility];  Service: Endoscopy;  Laterality: N/A;  sigmoid polyps  . Carpal bone excision Right 03/01/2015    Procedure: RIGHT TRAPEZIUM EXCISION;  Surgeon: Daryll Brod, MD;  Location: [facility];  Service: Orthopedics;  Laterality: Right;  ANESTHESIA:  CHOICE, REGIONAL BLOCK  . Carpometacarpel suspension plasty Right 03/01/2015    Procedure: RIGHT SUSPENSION PLASTY;  Surgeon: Daryll Brod, MD;  Location: [facility];  Service: Orthopedics;  Laterality: Right;  . Tendon transfer Right 03/01/2015    Procedure: RIGHT ABDUCTUS POLICUS LONGUS TRANSFER (SUSPENSION PLASTY);  Surgeon: Daryll Brod, MD;  Location: [facility];  Service: Orthopedics;   Laterality: Right;    There were no vitals filed for this visit.  Visit Diagnosis:  Pain in joint, hand, right - Plan: Ot plan of care cert/re-cert  Stiffness of joint, hand, right - Plan: Ot plan of care cert/re-cert  Weakness of right hand - Plan: Ot plan of care cert/re-cert      Subjective Assessment - 03/29/15 1023    Pertinent History s/p Rt thumb trapezium excision and APL transfer (suspension plasty) on 03/01/15, CMC arthritis for years, extensive PMH   Patient Stated Goals Get my splint and get going   Currently in Pain? Yes   Pain Score 6    Pain Location Hand  and thumb   Pain Orientation Right   Pain Descriptors / Indicators Aching;Sharp   Pain Type Acute pain;Surgical pain   Pain Onset 1 to 4 weeks ago   Pain Frequency Constant   Aggravating Factors  if accidentally using it   Pain Relieving Factors pain meds           [facility] OT Assessment - 03/29/15 0001    Assessment   Diagnosis s/p Rt thumb trapezium excision with APL transfer (due to chronic CMC arthritis)   Onset Date 03/01/15  surgery   Assessment Pt arrived wrapped and fully protected but reports did take off soft cast to bathe last night with caregiver assistance. Pt is now 4 weeks post-op   Prior Therapy outpatient w/ previous splints made   Precautions   Precautions Other (comment)   Precaution Comments splint on at all times except for hygiene care. (Pt now 4 weeks post-op which protocol states can begin A/ROM to wrist and thumb, however MD only wrote referral for splinting only. She sees Psychologist, sport and exercise at beginning of August, and instructed pt to wait on A/ROM until MD clears. Pt agrees)   Required Braces or Orthoses Other Brace/Splint  thumb spica splint per MD specifications   Other Brace/Splint thumb spica with wrist and MP neutral, IP free Rt thumb per MD orders   Balance Screen   Has the patient fallen in the past 6 months No  pt w/c bound and has 24 hr. caregiver   Home  Environment    Family/patient expects to be discharged to: --   Lives With --  in w/c accessible home with 24 hr. assist    Prior Function   Level of Independence Needs assistance with ADLs   Vocation On disability   ADL   ADL comments Pt requires assist for all ADLS secondary to extensive PMH - has 24 hr. assist   Written Expression   Dominant Hand Right  OT Treatments/Exercises (OP) - 03/29/15 0001    Splinting   Splinting Fabricated and fitted thumb spica splint, with wrist neutral, MP joint neutral, and IP joint free per MD orders. Issued splint. (No A/ROM started today even though protocol says ok to start at 4 weeks post-op secondary to order for splinting only, and pt sees MD in less than 2 weeks to re-assess. Pt instructed to wait on A/ROM to wrist and thumb until she sees MD)               OT Education - 03/29/15 1124    Education provided Yes   Education Details Splint wear and care, precautions   Methods Explanation;Handout   Comprehension Verbalized understanding          OT Short Term Goals - 03/29/15 1132    OT SHORT TERM GOAL #1   Title Independent w/ splint wear and care    Baseline issued, but may need adjustments   Time 4   Period Weeks   Status On-going           OT Long Term Goals - 03/29/15 1132    OT LONG TERM GOAL #1   Title Independent w/ HEP (if warranted by MD)   Baseline Dependent d/t current precautions/MD orders   Time 8   Period Weeks   Status New   OT LONG TERM GOAL #2   Title Pt to have 75% ROM Rt thumb to return to using for self care needs   Baseline unable to assess d/t current precautions   Time 8   Period Weeks   Status New   OT LONG TERM GOAL #3   Title Pain less than or equal to 4/10 with simple activity and self care needs   Baseline 6/10 at rest   Time 8   Period Weeks   Status New               Plan - 03/29/15 1126    Clinical Impression Statement Pt is a 59 y.o. female who presents to  outpatient rehab s/p trapezium excision and APL tendon transfer (70350, [id number]) on 03/01/15 d/t chronic CMC arthritis. Pt arrived fully wrapped and protected for splinting purposes.    Pt will benefit from skilled therapeutic intervention in order to improve on the following deficits (Retired) Decreased range of motion;Impaired sensation;Improper body mechanics;Impaired UE functional use;Pain;Decreased knowledge of precautions;Decreased strength   Rehab Potential Good   OT Frequency --  3 visits over 8 week duration   OT Treatment/Interventions Self-care/ADL training;Moist Heat;Fluidtherapy;DME and/or AE instruction;Splinting;Patient/family education;Therapeutic exercises;Scar mobilization;Therapeutic activities;Passive range of motion;Parrafin;Electrical Stimulation;Manual Therapy   Plan splint adjustments prn, progress per protocol if MD warrants    Consulted and Agree with Plan of Care Patient        Problem List Patient Active Problem List   Diagnosis Date Noted  . Arthritis 03/01/2015  . Mucosal abnormality of stomach   . Mucosal abnormality of esophagus   . Vitamin D deficiency 01/02/2015  . Postlaminectomy syndrome, lumbar region 12/29/2014  . Chronic lumbar radiculopathy 12/29/2014  . Dysphagia, pharyngoesophageal phase 07/18/2014  . Dyspepsia 07/13/2014  . Subacromial impingement 04/05/2014  . Trochanteric bursitis of left hip 12/15/2013  . COPD exacerbation 10/12/2013  . Acute respiratory failure 10/12/2013  . Tachycardia 10/12/2013  . Acute bronchitis 10/12/2013  . Allergic reaction 07/20/2013  . Hypothyroid 07/11/2013  . Allergic rhinitis   . Asthma   . COPD (chronic obstructive pulmonary disease)   . Depression   .  Diabetes mellitus without complication   . GERD (gastroesophageal reflux disease)   . Hyperlipidemia   . Osteoporosis   . Smoker   . Urinary tract infection 06/02/2013  . Leukocytosis, unspecified 05/28/2013  . IBS (irritable bowel syndrome) 05/11/2013   . Diarrhea 03/02/2013  . Abdominal pain, chronic, right upper quadrant 03/02/2013  . Gait disorder 01/14/2012  . Chronic leg pain 12/19/2011  . Chronic low back pain 12/19/2011  . Chronic neck pain 12/19/2011  . Shoulder pain, bilateral 12/19/2011  . Thoracic back pain 12/19/2011  . ABDOMINAL PAIN, GENERALIZED 02/06/2010  . GERD 11/27/2009  . CONSTIPATION, CHRONIC 11/27/2009  . History of colonic polyps 11/27/2009  . [place] ALLERGY 11/27/2009  . Allergy to latex 11/27/2009    Carey Bullocks, OTR/L 03/29/2015, 11:41 AM  [facility] [address] [place], [place], [address] Phone: [phone]   Fax:  [phone]

## 2015-03-29 NOTE — Patient Instructions (Signed)
WEARING SCHEDULE:  Wear splint at ALL times except for hygiene care (May remove splint for exercises and then immediately place back on ONLY if directed by the surgeon)  PURPOSE:  To prevent movement and for protection until injury can heal  CARE OF SPLINT:  Keep splint away from heat sources including: stove, radiator or furnace, or a car in sunlight. The splint can melt and will no longer fit you properly  Keep away from pets and children  Clean the splint with rubbing alcohol 1-2 times per day.  * During this time, make sure you also clean your hand/arm as instructed by your therapist and/or perform dressing changes as needed. Then dry hand/arm completely before replacing splint. (When cleaning hand/arm, keep it immobilized in same position until splint is replaced)  PRECAUTIONS/POTENTIAL PROBLEMS: *If you notice or experience increased pain, swelling, numbness, or a lingering reddened area from the splint: Contact your therapist immediately by calling [phone]. You must wear the splint for protection, but we will get you scheduled for adjustments as quickly as possible.  (If only straps or hooks need to be replaced and NO adjustments to the splint need to be made, just call the office ahead and let them know you are coming in)  If you have any medical concerns or signs of infection, please call your doctor immediately

## 2015-04-03 ENCOUNTER — Ambulatory Visit: Payer: Self-pay | Admitting: Family Medicine

## 2015-04-04 ENCOUNTER — Encounter: Payer: Self-pay | Admitting: Physician Assistant

## 2015-04-04 ENCOUNTER — Ambulatory Visit (INDEPENDENT_AMBULATORY_CARE_PROVIDER_SITE_OTHER): Payer: Medicaid Other | Admitting: Physician Assistant

## 2015-04-04 VITALS — BP 128/78 | HR 80 | Temp 98.1°F | Resp 20

## 2015-04-04 DIAGNOSIS — F32A Depression, unspecified: Secondary | ICD-10-CM

## 2015-04-04 DIAGNOSIS — E559 Vitamin D deficiency, unspecified: Secondary | ICD-10-CM

## 2015-04-04 DIAGNOSIS — F329 Major depressive disorder, single episode, unspecified: Secondary | ICD-10-CM

## 2015-04-04 DIAGNOSIS — E038 Other specified hypothyroidism: Secondary | ICD-10-CM | POA: Diagnosis not present

## 2015-04-04 DIAGNOSIS — Z720 Tobacco use: Secondary | ICD-10-CM

## 2015-04-04 DIAGNOSIS — M810 Age-related osteoporosis without current pathological fracture: Secondary | ICD-10-CM | POA: Diagnosis not present

## 2015-04-04 DIAGNOSIS — E119 Type 2 diabetes mellitus without complications: Secondary | ICD-10-CM

## 2015-04-04 DIAGNOSIS — F17200 Nicotine dependence, unspecified, uncomplicated: Secondary | ICD-10-CM

## 2015-04-04 DIAGNOSIS — E785 Hyperlipidemia, unspecified: Secondary | ICD-10-CM | POA: Diagnosis not present

## 2015-04-04 DIAGNOSIS — J439 Emphysema, unspecified: Secondary | ICD-10-CM

## 2015-04-04 LAB — TSH: TSH: 3.452 u[IU]/mL (ref 0.350–4.500)

## 2015-04-04 LAB — HEMOGLOBIN A1C
Hgb A1c MFr Bld: 7.8 % — ABNORMAL HIGH (ref <5.7)
Mean Plasma Glucose: 177 mg/dL — ABNORMAL HIGH (ref <117)
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
## 2015-04-06 ENCOUNTER — Other Ambulatory Visit: Payer: Self-pay | Admitting: Family Medicine

## 2015-04-06 DIAGNOSIS — IMO0002 Reserved for concepts with insufficient information to code with codable children: Secondary | ICD-10-CM

## 2015-04-06 DIAGNOSIS — E1165 Type 2 diabetes mellitus with hyperglycemia: Secondary | ICD-10-CM

## 2015-04-06 MED ORDER — PIOGLITAZONE HCL 45 MG PO TABS: 45.0000 mg | ORAL_TABLET | Freq: Every day | ORAL | Status: DC

## 2015-04-18 ENCOUNTER — Encounter: Payer: Self-pay | Admitting: Podiatry

## 2015-04-18 ENCOUNTER — Ambulatory Visit: Payer: Medicaid Other | Admitting: Podiatry

## 2015-04-18 ENCOUNTER — Ambulatory Visit (INDEPENDENT_AMBULATORY_CARE_PROVIDER_SITE_OTHER): Payer: Medicaid Other | Admitting: Podiatry

## 2015-04-18 VITALS — BP 126/73 | HR 99 | Temp 97.8°F | Resp 14 | Ht <= 58 in | Wt 255.0 lb

## 2015-04-18 DIAGNOSIS — B351 Tinea unguium: Secondary | ICD-10-CM | POA: Diagnosis not present

## 2015-04-18 DIAGNOSIS — M79676 Pain in unspecified toe(s): Secondary | ICD-10-CM | POA: Diagnosis not present
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
## 2015-04-18 NOTE — Patient Instructions (Signed)
Diabetes and Foot Care Diabetes may cause you to have problems because of poor blood supply (circulation) to your feet and legs. This may cause the skin on your feet to become thinner, break easier, and heal more slowly. Your skin may become dry, and the skin may peel and crack. You may also have nerve damage in your legs and feet causing decreased feeling in them. You may not notice minor injuries to your feet that could lead to infections or more serious problems. Taking care of your feet is one of the most important things you can do for yourself.  HOME CARE INSTRUCTIONS  Wear shoes at all times, even in the house. Do not go barefoot. Bare feet are easily injured.  Check your feet daily for blisters, cuts, and redness. If you cannot see the bottom of your feet, use a mirror or ask someone for help.  Wash your feet with warm water (do not use hot water) and mild soap. Then pat your feet and the areas between your toes until they are completely dry. Do not soak your feet as this can dry your skin.  Apply a moisturizing lotion or petroleum jelly (that does not contain alcohol and is unscented) to the skin on your feet and to dry, brittle toenails. Do not apply lotion between your toes.  Trim your toenails straight across. Do not dig under them or around the cuticle. File the edges of your nails with an emery board or nail file.  Do not cut corns or calluses or try to remove them with medicine.  Wear clean socks or stockings every day. Make sure they are not too tight. Do not wear knee-high stockings since they may decrease blood flow to your legs.  Wear shoes that fit properly and have enough cushioning. To break in new shoes, wear them for just a few hours a day. This prevents you from injuring your feet. Always look in your shoes before you put them on to be sure there are no objects inside.  Do not cross your legs. This may decrease the blood flow to your feet.  If you find a minor scrape,  cut, or break in the skin on your feet, keep it and the skin around it clean and dry. These areas may be cleansed with mild soap and water. Do not cleanse the area with peroxide, alcohol, or iodine.  When you remove an adhesive bandage, be sure not to damage the skin around it.  If you have a wound, look at it several times a day to make sure it is healing.  Do not use heating pads or hot water bottles. They may burn your skin. If you have lost feeling in your feet or legs, you may not know it is happening until it is too late.  Make sure your health care provider performs a complete foot exam at least annually or more often if you have foot problems. Report any cuts, sores, or bruises to your health care provider immediately. SEEK MEDICAL CARE IF:   You have an injury that is not healing.  You have cuts or breaks in the skin.  You have an ingrown nail.  You notice redness on your legs or feet.  You feel burning or tingling in your legs or feet.  You have pain or cramps in your legs and feet.  Your legs or feet are numb.  Your feet always feel cold. SEEK IMMEDIATE MEDICAL CARE IF:   There is increasing redness,   swelling, or pain in or around a wound.  There is a red line that goes up your leg.  Pus is coming from a wound.  You develop a fever or as directed by your health care provider.  You notice a bad smell coming from an ulcer or wound. Document Released: 08/22/2000 Document Revised: 04/27/2013 Document Reviewed: 02/01/2013 ExitCare Patient Information 2015 [employer]. This information is not intended to replace advice given to you by your health care provider. Make sure you discuss any questions you have with your health care provider.  

## 2015-04-19 ENCOUNTER — Encounter: Payer: Medicaid Other | Attending: Physical Medicine and Rehabilitation

## 2015-04-19 ENCOUNTER — Encounter: Payer: Self-pay | Admitting: Physical Medicine & Rehabilitation

## 2015-04-19 ENCOUNTER — Ambulatory Visit (HOSPITAL_BASED_OUTPATIENT_CLINIC_OR_DEPARTMENT_OTHER): Payer: Medicaid Other | Admitting: Physical Medicine & Rehabilitation

## 2015-04-19 VITALS — BP 152/81 | HR 98

## 2015-04-19 DIAGNOSIS — M47816 Spondylosis without myelopathy or radiculopathy, lumbar region: Secondary | ICD-10-CM | POA: Insufficient documentation

## 2015-04-19 DIAGNOSIS — M961 Postlaminectomy syndrome, not elsewhere classified: Secondary | ICD-10-CM | POA: Diagnosis not present

## 2015-04-19 DIAGNOSIS — J449 Chronic obstructive pulmonary disease, unspecified: Secondary | ICD-10-CM | POA: Insufficient documentation

## 2015-04-19 DIAGNOSIS — E039 Hypothyroidism, unspecified: Secondary | ICD-10-CM | POA: Insufficient documentation

## 2015-04-19 DIAGNOSIS — M25512 Pain in left shoulder: Secondary | ICD-10-CM | POA: Insufficient documentation

## 2015-04-19 DIAGNOSIS — M25551 Pain in right hip: Secondary | ICD-10-CM | POA: Insufficient documentation

## 2015-04-19 DIAGNOSIS — E119 Type 2 diabetes mellitus without complications: Secondary | ICD-10-CM | POA: Diagnosis not present

## 2015-04-19 DIAGNOSIS — M810 Age-related osteoporosis without current pathological fracture: Secondary | ICD-10-CM | POA: Diagnosis not present

## 2015-04-19 DIAGNOSIS — K219 Gastro-esophageal reflux disease without esophagitis: Secondary | ICD-10-CM | POA: Diagnosis not present

## 2015-04-19 DIAGNOSIS — M47814 Spondylosis without myelopathy or radiculopathy, thoracic region: Secondary | ICD-10-CM | POA: Insufficient documentation

## 2015-04-19 DIAGNOSIS — M7062 Trochanteric bursitis, left hip: Secondary | ICD-10-CM | POA: Diagnosis not present

## 2015-04-19 DIAGNOSIS — M5416 Radiculopathy, lumbar region: Secondary | ICD-10-CM | POA: Diagnosis not present

## 2015-04-19 DIAGNOSIS — E785 Hyperlipidemia, unspecified: Secondary | ICD-10-CM | POA: Insufficient documentation

## 2015-04-19 DIAGNOSIS — F17210 Nicotine dependence, cigarettes, uncomplicated: Secondary | ICD-10-CM | POA: Insufficient documentation

## 2015-04-19 DIAGNOSIS — M79604 Pain in right leg: Secondary | ICD-10-CM | POA: Insufficient documentation

## 2015-04-19 DIAGNOSIS — G8929 Other chronic pain: Secondary | ICD-10-CM | POA: Insufficient documentation

## 2015-04-19 MED ORDER — HYDROCODONE-ACETAMINOPHEN 10-325 MG PO TABS: 1.0000 | ORAL_TABLET | Freq: Three times a day (TID) | ORAL | Status: DC

## 2015-04-19 NOTE — Patient Instructions (Signed)
Hip Bursitis Bursitis is a swelling and soreness (inflammation) of a fluid-filled sac (bursa). This sac overlies and protects the joints.  CAUSES   Injury.  Overuse of the muscles surrounding the joint.  Arthritis.  Gout.  Infection.  Cold weather.  Inadequate warm-up and conditioning prior to activities. The cause may not be known.  SYMPTOMS   Mild to severe irritation.  Tenderness and swelling over the outside of the hip.  Pain with motion of the hip.  If the bursa becomes infected, a fever may be present. Redness, tenderness, and warmth will develop over the hip. Symptoms usually lessen in 3 to 4 weeks with treatment, but can come back. TREATMENT If conservative treatment does not work, your caregiver may advise draining the bursa and injecting cortisone into the area. This may speed up the healing process. This may also be used as an initial treatment of choice. HOME CARE INSTRUCTIONS   Apply ice to the affected area for 15-20 minutes every 3 to 4 hours while awake for the first 2 days. Put the ice in a plastic bag and place a towel between the bag of ice and your skin.  Rest the painful joint as much as possible, but continue to put the joint through a normal range of motion at least 4 times per day. When the pain lessens, begin normal, slow movements and usual activities to help prevent stiffness of the hip.  Only take over-the-counter or prescription medicines for pain, discomfort, or fever as directed by your caregiver.  Use crutches to limit weight bearing on the hip joint, if advised.  Elevate your painful hip to reduce swelling. Use pillows for propping and cushioning your legs and hips.  Gentle massage may provide comfort and decrease swelling. SEEK IMMEDIATE MEDICAL CARE IF:   Your pain increases even during treatment, or you are not improving.  You have a fever.  You have heat and inflammation over the involved bursa.  You have any other questions or  concerns. MAKE SURE YOU:   Understand these instructions.  Will watch your condition.  Will get help right away if you are not doing well or get worse. Document Released: 02/14/2002 Document Revised: 11/17/2011 Document Reviewed: 09/13/2008 ExitCare Patient Information 2015 [employer]. This information is not intended to replace advice given to you by your health care provider. Make sure you discuss any questions you have with your health care provider.  

## 2015-04-19 NOTE — Progress Notes (Signed)
Subjective:    Patient ID: Lisa Ewing, female    DOB: [birth year], 59 y.o.   MRN: [id number]  HPI Patient returns today she initially was scheduled for left greater trochanter injection under ultrasound guidance. She states that she continues to have pain in the left hip and she is unable to sleep on that side. She states that she does not have time for injection today and would like to do this next month instead. Patient remains on hydrocodone 10 mg 3 times per day. She's had no complications from the medication.  Review of systems as below Pain Inventory Average Pain 8 Pain Right Now 8 My pain is sharp, burning, stabbing, tingling and aching  In the last 24 hours, has pain interfered with the following? General activity 9 Relation with others 9 Enjoyment of life 9 What TIME of day is your pain at its worst? all Sleep (in general) Fair  Pain is worse with: walking, bending, sitting, standing and some activites Pain improves with: rest, heat/ice, medication, TENS and injections Relief from Meds: 5  Mobility how many minutes can you walk? 0 ability to climb steps?  no do you drive?  no  Function I need assistance with the following:  dressing, bathing, toileting, meal prep, household duties and shopping  Neuro/Psych bladder control problems weakness numbness tremor tingling trouble walking spasms dizziness confusion depression  Prior Studies Any changes since last visit?  no  Physicians involved in your care Any changes since last visit?  no   Family History  Problem Relation Age of Onset  . Breast cancer Maternal Aunt   . Throat cancer Maternal Grandmother   . Hyperlipidemia Mother   . Diabetes Father   . Colon cancer Neg Hx    Social History   Social History  . Marital Status: Divorced    Spouse Name: N/A  . Number of Children: N/A  . Years of Education: N/A   Social History Main Topics  . Smoking status: Current Every Day Smoker -- 1.00  packs/day for 42 years    Types: Cigarettes  . Smokeless tobacco: Never Used  . Alcohol Use: 0.0 oz/week    0 Standard drinks or equivalent per week     Comment: cocktail once to twice a week to help relax  . Drug Use: No  . Sexual Activity: No   Other Topics Concern  . None   Social History Narrative   Past Surgical History  Procedure Laterality Date  . S/p hysterectomy  1999    with BSO  . Thyroidectomy  2007    for large benign tumors  . Rotator cuff repair Right 2006  . Thumb surgery  2010    left-removal of tumor  . Esophagogastroduodenoscopy  05/02/2010    EYC:[id number] hiatal hernia, endoscopically looked like barretts esophagus but NEG biopsy  . Colonoscopy  05/02/2010    EHU:[id number] elongated colon. multiple left colon polyps. Next TCS 04/2015  . Abdominal hysterectomy    . Hip surgery Right   . Back surgery  1995    L-5 and S1  . Diagnostic laparoscopy    . Colonoscopy with propofol N/A 02/12/2015    Procedure: COLONOSCOPY WITH PROPOFOL;  Surgeon: Daneil Dolin, MD;  Location: [facility];  Service: Endoscopy;  Laterality: N/A;  In cecum @ 0818, out @ 0833  . Esophagogastroduodenoscopy (egd) with propofol N/A 02/12/2015    Procedure: ESOPHAGOGASTRODUODENOSCOPY (EGD) WITH PROPOFOL;  Surgeon: Daneil Dolin, MD;  Location: [facility];  Service: Endoscopy;  Laterality: N/A;  . Esophageal dilation N/A 02/12/2015    Procedure: ESOPHAGEAL DILATION;  Surgeon: Daneil Dolin, MD;  Location: [facility];  Service: Endoscopy;  Laterality: N/A;  [place] # 60  . Esophageal biopsy N/A 02/12/2015    Procedure: BIOPSY;  Surgeon: Daneil Dolin, MD;  Location: [facility];  Service: Endoscopy;  Laterality: N/A;  . Polypectomy N/A 02/12/2015    Procedure: POLYPECTOMY;  Surgeon: Daneil Dolin, MD;  Location: [facility];  Service: Endoscopy;  Laterality: N/A;  sigmoid polyps  . Carpal bone excision Right 03/01/2015    Procedure: RIGHT TRAPEZIUM EXCISION;  Surgeon: Daryll Brod, MD;  Location: [place];   Service: Orthopedics;  Laterality: Right;  ANESTHESIA:  CHOICE, REGIONAL BLOCK  . Carpometacarpel suspension plasty Right 03/01/2015    Procedure: RIGHT SUSPENSION PLASTY;  Surgeon: Daryll Brod, MD;  Location: [place];  Service: Orthopedics;  Laterality: Right;  . Tendon transfer Right 03/01/2015    Procedure: RIGHT ABDUCTUS POLICUS LONGUS TRANSFER (SUSPENSION PLASTY);  Surgeon: Daryll Brod, MD;  Location: [place];  Service: Orthopedics;  Laterality: Right;   Past Medical History  Diagnosis Date  . Barrett esophagus     gives h/o diagnosed elsewhere. Two negative biopsies in 2011 however.  . Chronic pain     from [place] in 2003  . Back fracture   . Broken hip     right  . Bursitis of left hip   . Sciatica   . Cervicalgia   . Lumbago   . Disorder of sacroiliac joint   . Pain in joint, upper arm   . Trochanteric bursitis   . 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 Pain in joint, pelvic region and thigh   . Hx of cervical cancer     age 27  . History of uterine cancer 1998    hysterectomy  . Hyperplastic colon polyp   . Vitamin D deficiency   . Shingles   . UTI (lower urinary tract infection)   . Allergic rhinitis   . Asthma   . COPD (chronic obstructive pulmonary disease)   . Depression   . Diabetes mellitus without complication   . GERD (gastroesophageal reflux disease)   . Hyperlipidemia   . Osteoporosis   . Hypothyroid 07/11/2013  . Smoker   . TMJ (dislocation of temporomandibular joint)   . Sleep apnea     pt sts i do not use because "the rubber bothers me".  . Sleep apnea   . Short-term memory loss    BP 152/81 mmHg  Pulse 98  SpO2 92%  Opioid Risk Score:   Fall Risk Score:  `1  Depression screen PHQ 2/9  Depression screen [facility] 2/9 04/19/2015 11/24/2014  Decreased Interest 0 3  Down, Depressed, Hopeless 0 2  PHQ - 2 Score 0 5  Altered sleeping - 2  Tired, decreased energy - 3  Change in appetite - 2  Feeling bad or failure  about yourself  - 3  Trouble concentrating - 1  Moving slowly or fidgety/restless - 1  Suicidal thoughts - 0  PHQ-9 Score - 17     Review of Systems  Genitourinary:       Bladder control problems  Musculoskeletal: Positive for gait problem.       Spasms  Neurological: Positive for dizziness, weakness and numbness.       Tingling  Psychiatric/Behavioral: Positive for confusion and dysphoric mood. The patient is nervous/anxious.  All other systems reviewed and are negative.      Objective:   Physical Exam  Constitutional: She is oriented to person, place, and time. She appears well-developed.  Morbid obesity  HENT:  Head: Normocephalic and atraumatic.  Eyes: Conjunctivae and EOM are normal. Pupils are equal, round, and reactive to light.  Neck: Normal range of motion.  Musculoskeletal:       Left hip: She exhibits tenderness. She exhibits no deformity.  Neurological: She is alert and oriented to person, place, and time.  Psychiatric: She has a normal mood and affect.  Nursing note and vitals reviewed.   Tenderness to palpation along the thoracic paraspinal area. No deformity noted. She has normal upper extremity function. Positive straight leg on the right side 4 minus knee extensors limited by pain 3 minus ankle dorsiflexion by pain on the right side 5/5 left hip flexor knee extensor and ankle dorsal flexor. There is tenderness palpation left greater trochanter. No tenderness to palpation over the right greater trochanter Sensation reduced right S1 dermatomal distribution      Assessment & Plan:  1. Chronic pain syndrome multifocal, chronic low back pain and mid back pain as well as right lower extremity pain The chronic mid back pain appears to be mainly related to fibromyalgia syndrome as well as her sedentary lifestyle and lack of exercise Continue hydrocodone10 mg 3 times a day  Continue opioid monitoring program. This consists of regular clinic visits,  examinations, urine drug screen, pill counts as well as use of [place] controlled substance reporting System. . Thoracic spine MRI was unremarkable in 2013 Lumbar MRI in 2013 demonstrated prior surgery hemilaminectomy at L5-S1 on the right side. Small protrusion at L4-5 but no spinal stenosis. Small compression deformity L3 superior endplate  Right hip osteoarthritis status post replacement with chronic postoperative pain  Right radicular pain  and numbness  likely secondary to prior disc at L5-S1 mainly Right S1 levels,Consider epidural if this worsens

## 2015-04-26 ENCOUNTER — Ambulatory Visit: Payer: Medicaid Other | Admitting: Physical Medicine & Rehabilitation

## 2015-05-01 ENCOUNTER — Other Ambulatory Visit: Payer: Self-pay | Admitting: Physical Medicine & Rehabilitation

## 2015-05-08 DIAGNOSIS — J441 Chronic obstructive pulmonary disease with (acute) exacerbation: Secondary | ICD-10-CM | POA: Diagnosis not present

## 2015-05-08 DIAGNOSIS — M79609 Pain in unspecified limb: Secondary | ICD-10-CM | POA: Diagnosis not present

## 2015-05-08 DIAGNOSIS — M13869 Other specified arthritis, unspecified knee: Secondary | ICD-10-CM | POA: Diagnosis not present

## 2015-05-08 DIAGNOSIS — M13859 Other specified arthritis, unspecified hip: Secondary | ICD-10-CM | POA: Diagnosis not present

## 2015-05-09 ENCOUNTER — Other Ambulatory Visit: Payer: Self-pay | Admitting: Family Medicine

## 2015-05-09 DIAGNOSIS — Z1231 Encounter for screening mammogram for malignant neoplasm of breast: Secondary | ICD-10-CM

## 2015-05-18 ENCOUNTER — Encounter: Payer: Self-pay | Admitting: Physical Medicine & Rehabilitation

## 2015-05-18 ENCOUNTER — Ambulatory Visit (HOSPITAL_BASED_OUTPATIENT_CLINIC_OR_DEPARTMENT_OTHER): Payer: Medicaid Other | Admitting: Physical Medicine & Rehabilitation

## 2015-05-18 ENCOUNTER — Encounter: Payer: Medicaid Other | Attending: Physical Medicine and Rehabilitation

## 2015-05-18 ENCOUNTER — Other Ambulatory Visit: Payer: Self-pay | Admitting: Physical Medicine & Rehabilitation

## 2015-05-18 VITALS — BP 123/63 | HR 100 | Resp 14

## 2015-05-18 DIAGNOSIS — E785 Hyperlipidemia, unspecified: Secondary | ICD-10-CM | POA: Insufficient documentation

## 2015-05-18 DIAGNOSIS — G894 Chronic pain syndrome: Secondary | ICD-10-CM | POA: Diagnosis not present

## 2015-05-18 DIAGNOSIS — E039 Hypothyroidism, unspecified: Secondary | ICD-10-CM | POA: Insufficient documentation

## 2015-05-18 DIAGNOSIS — K219 Gastro-esophageal reflux disease without esophagitis: Secondary | ICD-10-CM | POA: Insufficient documentation

## 2015-05-18 DIAGNOSIS — M79604 Pain in right leg: Secondary | ICD-10-CM | POA: Insufficient documentation

## 2015-05-18 DIAGNOSIS — Z79899 Other long term (current) drug therapy: Secondary | ICD-10-CM

## 2015-05-18 DIAGNOSIS — M25551 Pain in right hip: Secondary | ICD-10-CM | POA: Diagnosis not present

## 2015-05-18 DIAGNOSIS — F17210 Nicotine dependence, cigarettes, uncomplicated: Secondary | ICD-10-CM | POA: Diagnosis not present

## 2015-05-18 DIAGNOSIS — E119 Type 2 diabetes mellitus without complications: Secondary | ICD-10-CM | POA: Insufficient documentation

## 2015-05-18 DIAGNOSIS — M47816 Spondylosis without myelopathy or radiculopathy, lumbar region: Secondary | ICD-10-CM | POA: Diagnosis not present

## 2015-05-18 DIAGNOSIS — M25512 Pain in left shoulder: Secondary | ICD-10-CM | POA: Diagnosis not present

## 2015-05-18 DIAGNOSIS — Z5181 Encounter for therapeutic drug level monitoring: Secondary | ICD-10-CM

## 2015-05-18 DIAGNOSIS — J449 Chronic obstructive pulmonary disease, unspecified: Secondary | ICD-10-CM | POA: Diagnosis not present

## 2015-05-18 DIAGNOSIS — G8929 Other chronic pain: Secondary | ICD-10-CM | POA: Insufficient documentation

## 2015-05-18 DIAGNOSIS — M47814 Spondylosis without myelopathy or radiculopathy, thoracic region: Secondary | ICD-10-CM | POA: Insufficient documentation

## 2015-05-18 DIAGNOSIS — M810 Age-related osteoporosis without current pathological fracture: Secondary | ICD-10-CM | POA: Insufficient documentation

## 2015-05-18 DIAGNOSIS — M5416 Radiculopathy, lumbar region: Secondary | ICD-10-CM

## 2015-05-18 DIAGNOSIS — M7062 Trochanteric bursitis, left hip: Secondary | ICD-10-CM

## 2015-05-18 MED ORDER — DICLOFENAC SODIUM 1 % TD GEL: 2.0000 g | Freq: Four times a day (QID) | TRANSDERMAL | Status: DC

## 2015-05-18 MED ORDER — HYDROCODONE-ACETAMINOPHEN 10-325 MG PO TABS: 1.0000 | ORAL_TABLET | Freq: Three times a day (TID) | ORAL | Status: DC

## 2015-05-18 NOTE — Patient Instructions (Signed)
Joint Injection  Care After  Refer to this sheet in the next few days. These instructions provide you with information on caring for yourself after you have had a joint injection. Your caregiver also may give you more specific instructions. Your treatment has been planned according to current medical practices, but problems sometimes occur. Call your caregiver if you have any problems or questions after your procedure.  After any type of joint injection, it is not uncommon to experience:  · Soreness, swelling, or bruising around the injection site.  · Mild numbness, tingling, or weakness around the injection site caused by the numbing medicine used before or with the injection.  It also is possible to experience the following effects associated with the specific agent after injection:  · Iodine-based contrast agents:  ¨ Allergic reaction (itching, hives, widespread redness, and swelling beyond the injection site).  · Corticosteroids (These effects are rare.):  ¨ Allergic reaction.  ¨ Increased blood sugar levels (If you have diabetes and you notice that your blood sugar levels have increased, notify your caregiver).  ¨ Increased blood pressure levels.  ¨ Mood swings.  · Hyaluronic acid in the use of viscosupplementation.  ¨ Temporary heat or redness.  ¨ Temporary rash and itching.  ¨ Increased fluid accumulation in the injected joint.  These effects all should resolve within a day after your procedure.   HOME CARE INSTRUCTIONS  · Limit yourself to light activity the day of your procedure. Avoid lifting heavy objects, bending, stooping, or twisting.  · Take prescription or over-the-counter pain medication as directed by your caregiver.  · You may apply ice to your injection site to reduce pain and swelling the day of your procedure. Ice may be applied 03-04 times:  ¨ Put ice in a plastic bag.  ¨ Place a towel between your skin and the bag.  ¨ Leave the ice on for no longer than 15-20 minutes each time.  SEEK  IMMEDIATE MEDICAL CARE IF:   · Pain and swelling get worse rather than better or extend beyond the injection site.  · Numbness does not go away.  · Blood or fluid continues to leak from the injection site.  · You have chest pain.  · You have swelling of your face or tongue.  · You have trouble breathing or you become dizzy.  · You develop a fever, chills, or severe tenderness at the injection site that last longer than 1 day.  MAKE SURE YOU:  · Understand these instructions.  · Watch your condition.  · Get help right away if you are not doing well or if you get worse.  Document Released: 05/08/2011 Document Revised: 11/17/2011 Document Reviewed: 05/08/2011  [employer]® Patient Information ©2015 [employer]. This information is not intended to replace advice given to you by your health care provider. Make sure you discuss any questions you have with your health care provider.

## 2015-05-18 NOTE — Progress Notes (Signed)
LEFT Trochanteric bursa injection With  ultrasound guidance  Indication Trochanteric bursitis. Exam has tenderness over the greater trochanter of the hip. Pain has not responded to conservative care such as exercise therapy and oral medications. Pain interferes with sleep or with mobility, previous good relief with ultrasound guided trochanteric bursa injection. Patient is morbidly obese Informed consent was obtained after describing risks and benefits of the procedure with the patient these include bleeding bruising and infection. Patient has signed written consent form. Patient placed in a lateral decubitus position with the affected hip superior. Point of maximal pain was palpated marked and prepped with Betadine and entered with a 25-gauge 1.5 inch needleneedle , 1% lidocaine was infiltrated .Then a 50 mm 22-gauge echo block needle was inserted under direct ultrasound visualization. Bone contact was made. Needle slightly withdrawn then 6mg of betamethasone with 4 cc 1% lidocaine were injected. Patient tolerated procedure well. Post procedure instructions given.  

## 2015-05-19 LAB — PMP ALCOHOL METABOLITE (ETG): ETGU: NEGATIVE ng/mL

## 2015-05-23 ENCOUNTER — Ambulatory Visit (HOSPITAL_COMMUNITY)
Admission: RE | Admit: 2015-05-23 | Discharge: 2015-05-23 | Disposition: A | Discharge status: Home / Self Care | Payer: Medicaid Other | Source: Ambulatory Visit | Attending: Family Medicine | Admitting: Family Medicine

## 2015-05-23 DIAGNOSIS — Z1231 Encounter for screening mammogram for malignant neoplasm of breast: Secondary | ICD-10-CM | POA: Diagnosis present

## 2015-05-25 LAB — OPIATES/OPIOIDS (LC/MS-MS)
Codeine Urine: NEGATIVE ng/mL (ref <50)
HYDROMORPHONE: 165 ng/mL (ref <50)
Hydrocodone: 2537 ng/mL (ref <50)
Morphine Urine: NEGATIVE ng/mL (ref <50)
Norhydrocodone, Ur: 1110 ng/mL (ref <50)
Noroxycodone, Ur: NEGATIVE ng/mL (ref <50)
OXYCODONE, UR: NEGATIVE ng/mL (ref <50)
Oxymorphone: NEGATIVE ng/mL (ref <50)

## 2015-05-25 LAB — MDMA (ECSTASY), URINE
MDA GC/MS confirm: NEGATIVE ng/mL (ref <200)
MDMA GC/MS CONF: NEGATIVE ng/mL (ref <200)

## 2015-05-26 LAB — PRESCRIPTION MONITORING PROFILE (SOLSTAS)
Amphetamine/Meth: NEGATIVE ng/mL
Barbiturate Screen, Urine: NEGATIVE ng/mL
Benzodiazepine Screen, Urine: NEGATIVE ng/mL
Buprenorphine, Urine: NEGATIVE ng/mL
CANNABINOID SCRN UR: NEGATIVE ng/mL
CARISOPRODOL, URINE: NEGATIVE ng/mL
COCAINE METABOLITES: NEGATIVE ng/mL
CREATININE, URINE: 109.89 mg/dL (ref >20.0)
FENTANYL URINE: NEGATIVE ng/mL
Meperidine, Ur: NEGATIVE ng/mL
Methadone Screen, Urine: NEGATIVE ng/mL
Nitrites, Initial: NEGATIVE ug/mL
Oxycodone Screen, Ur: NEGATIVE ng/mL
Propoxyphene: NEGATIVE ng/mL
Tapentadol, urine: NEGATIVE ng/mL
Tramadol Scrn, Ur: NEGATIVE ng/mL
Zolpidem, Urine: NEGATIVE ng/mL
pH, Initial: 5.4 pH (ref 4.5–8.9)

## 2015-05-28 ENCOUNTER — Other Ambulatory Visit: Payer: Self-pay | Admitting: Family Medicine

## 2015-05-28 ENCOUNTER — Other Ambulatory Visit: Payer: Self-pay | Admitting: Cardiovascular Disease

## 2015-05-28 ENCOUNTER — Other Ambulatory Visit: Payer: Self-pay | Admitting: Physician Assistant

## 2015-05-28 NOTE — Telephone Encounter (Signed)
Refill appropriate and filled per protocol. 

## 2015-05-29 NOTE — Telephone Encounter (Signed)
Patient is prn follow up. Ok to refill or should this be deferred to pcp? Please advise. Thanks, MI

## 2015-06-05 ENCOUNTER — Ambulatory Visit: Payer: Medicaid Other | Admitting: Gastroenterology

## 2015-06-15 ENCOUNTER — Encounter: Payer: Self-pay | Admitting: Registered Nurse

## 2015-06-15 ENCOUNTER — Encounter: Payer: Medicaid Other | Attending: Physical Medicine and Rehabilitation | Admitting: Registered Nurse

## 2015-06-15 VITALS — BP 138/60 | HR 95

## 2015-06-15 DIAGNOSIS — M545 Low back pain, unspecified: Secondary | ICD-10-CM

## 2015-06-15 DIAGNOSIS — E119 Type 2 diabetes mellitus without complications: Secondary | ICD-10-CM | POA: Insufficient documentation

## 2015-06-15 DIAGNOSIS — G894 Chronic pain syndrome: Secondary | ICD-10-CM

## 2015-06-15 DIAGNOSIS — M25551 Pain in right hip: Secondary | ICD-10-CM | POA: Insufficient documentation

## 2015-06-15 DIAGNOSIS — M961 Postlaminectomy syndrome, not elsewhere classified: Secondary | ICD-10-CM

## 2015-06-15 DIAGNOSIS — M25512 Pain in left shoulder: Secondary | ICD-10-CM | POA: Insufficient documentation

## 2015-06-15 DIAGNOSIS — E039 Hypothyroidism, unspecified: Secondary | ICD-10-CM | POA: Insufficient documentation

## 2015-06-15 DIAGNOSIS — F17210 Nicotine dependence, cigarettes, uncomplicated: Secondary | ICD-10-CM | POA: Insufficient documentation

## 2015-06-15 DIAGNOSIS — M47816 Spondylosis without myelopathy or radiculopathy, lumbar region: Secondary | ICD-10-CM | POA: Diagnosis not present

## 2015-06-15 DIAGNOSIS — K219 Gastro-esophageal reflux disease without esophagitis: Secondary | ICD-10-CM | POA: Diagnosis not present

## 2015-06-15 DIAGNOSIS — G8929 Other chronic pain: Secondary | ICD-10-CM | POA: Diagnosis present

## 2015-06-15 DIAGNOSIS — J449 Chronic obstructive pulmonary disease, unspecified: Secondary | ICD-10-CM | POA: Insufficient documentation

## 2015-06-15 DIAGNOSIS — E785 Hyperlipidemia, unspecified: Secondary | ICD-10-CM | POA: Diagnosis not present

## 2015-06-15 DIAGNOSIS — Z5181 Encounter for therapeutic drug level monitoring: Secondary | ICD-10-CM

## 2015-06-15 DIAGNOSIS — M810 Age-related osteoporosis without current pathological fracture: Secondary | ICD-10-CM | POA: Diagnosis not present

## 2015-06-15 DIAGNOSIS — M5416 Radiculopathy, lumbar region: Secondary | ICD-10-CM | POA: Diagnosis not present

## 2015-06-15 DIAGNOSIS — Z79899 Other long term (current) drug therapy: Secondary | ICD-10-CM

## 2015-06-15 DIAGNOSIS — M47814 Spondylosis without myelopathy or radiculopathy, thoracic region: Secondary | ICD-10-CM | POA: Insufficient documentation

## 2015-06-15 DIAGNOSIS — M79604 Pain in right leg: Secondary | ICD-10-CM | POA: Diagnosis not present

## 2015-06-15 MED ORDER — HYDROCODONE-ACETAMINOPHEN 10-325 MG PO TABS: 1.0000 | ORAL_TABLET | Freq: Three times a day (TID) | ORAL | Status: DC

## 2015-06-15 NOTE — Progress Notes (Signed)
Subjective:    Patient ID: Lisa Ewing, female    DOB: Nov 27, 1955, 59 y.o.   MRN: [id number]  HPI: [name] is a 59 year old female who returns for follow up for chronic pain and medication refill. She says her pain is located in her lower back and radiating into her right hip and right lower extremity laterally. She rates her pain 7. She's not following her usual exercise regime encourage to resume she verbalizes understanding. S/P Left hip injection with good relief noted.  Pain Inventory Average Pain 8 Pain Right Now 7 My pain is constant, sharp, burning, stabbing, tingling and aching  In the last 24 hours, has pain interfered with the following? General activity 9 Relation with others 10 Enjoyment of life 10 What TIME of day is your pain at its worst? all time Sleep (in general) Fair  Pain is worse with: walking, bending, sitting, standing and some activites Pain improves with: rest, heat/ice, medication, TENS and injections Relief from Meds: 5  Mobility how many minutes can you walk? 0 ability to climb steps?  no do you drive?  no needs help with transfers  Function I need assistance with the following:  dressing, bathing, meal prep, household duties and shopping Do you have any goals in this area?  yes  Neuro/Psych bladder control problems weakness numbness tremor tingling spasms dizziness confusion depression anxiety  Prior Studies Any changes since last visit?  yes  Physicians involved in your care Any changes since last visit?  no   Family History  Problem Relation Age of Onset  . Breast cancer Maternal Aunt   . Throat cancer Maternal Grandmother   . Hyperlipidemia Mother   . Diabetes Father   . Colon cancer Neg Hx    Social History   Social History  . Marital Status: Divorced    Spouse Name: N/A  . Number of Children: N/A  . Years of Education: N/A   Social History Main Topics  . Smoking status: Current Every Day Smoker --  1.00 packs/day for 42 years    Types: Cigarettes  . Smokeless tobacco: Never Used  . Alcohol Use: 0.0 oz/week    0 Standard drinks or equivalent per week     Comment: cocktail once to twice a week to help relax  . Drug Use: No  . Sexual Activity: No   Other Topics Concern  . None   Social History Narrative   Past Surgical History  Procedure Laterality Date  . S/p hysterectomy  1999    with BSO  . Thyroidectomy  2007    for large benign tumors  . Rotator cuff repair Right 2006  . Thumb surgery  2010    left-removal of tumor  . Esophagogastroduodenoscopy  05/02/2010    MQK:[id number] hiatal hernia, endoscopically looked like barretts esophagus but NEG biopsy  . Colonoscopy  05/02/2010    [id number] elongated colon. multiple left colon polyps. Next TCS 04/2015  . Abdominal hysterectomy    . Hip surgery Right   . Back surgery  1995    L-5 and S1  . Diagnostic laparoscopy    . Colonoscopy with propofol N/A 02/12/2015    RMR: Multiple colonic polyps ablated/ removed as described above.   . Esophagogastroduodenoscopy (egd) with propofol N/A 02/12/2015    RMR: Abnormal appearing distal esophagus. Query short segment Barretts status post dilation and subsequent biopsy. Small hiatal hernia. Subtly abnormal gastric mucosa of uncertain significance status post biopsy.   Marland Kitchen  Esophageal dilation N/A 02/12/2015    Procedure: ESOPHAGEAL DILATION;  Surgeon: Daneil Dolin, MD;  Location: [facility];  Service: Endoscopy;  Laterality: N/A;  [place] # 73  . Esophageal biopsy N/A 02/12/2015    Procedure: BIOPSY;  Surgeon: Daneil Dolin, MD;  [facility];  Service: Endoscopy;  Laterality: N/A;  . Polypectomy N/A 02/12/2015    Procedure: POLYPECTOMY;  Surgeon: Daneil Dolin, MD;  Location: [facility];  Service: Endoscopy;  Laterality: N/A;  sigmoid polyps  . Carpal bone excision Right 03/01/2015    Procedure: RIGHT TRAPEZIUM EXCISION;  Surgeon: Daryll Brod, MD;  Location: [place];  Service:  Orthopedics;  Laterality: Right;  ANESTHESIA:  CHOICE, REGIONAL BLOCK  . Carpometacarpel suspension plasty Right 03/01/2015    Procedure: RIGHT SUSPENSION PLASTY;  Surgeon: Daryll Brod, MD;  Location: [place];  Service: Orthopedics;  Laterality: Right;  . Tendon transfer Right 03/01/2015    Procedure: RIGHT ABDUCTUS POLICUS LONGUS TRANSFER (SUSPENSION PLASTY);  Surgeon: Daryll Brod, MD;  Location: [place];  Service: Orthopedics;  Laterality: Right;   Past Medical History  Diagnosis Date  . Barrett esophagus     gives h/o diagnosed elsewhere. Two negative biopsies in 2011 however.  . Chronic pain     from [place] in 2003  . Back fracture   . Broken hip ([place])     right  . Bursitis of left hip   . Sciatica   . Cervicalgia   . Lumbago   . Disorder of sacroiliac joint   . Pain in joint, upper arm   . Trochanteric bursitis   . 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 Pain in joint, pelvic region and thigh   . Hx of cervical cancer     age 58  . History of uterine cancer 1998    hysterectomy  . Hyperplastic colon polyp   . Vitamin D deficiency   . Shingles   . UTI (lower urinary tract infection)   . Allergic rhinitis   . Asthma   . COPD (chronic obstructive pulmonary disease) ([place])   . Depression   . Diabetes mellitus without complication ([place])   . GERD (gastroesophageal reflux disease)   . Hyperlipidemia   . Osteoporosis   . Hypothyroid 07/11/2013  . Smoker   . TMJ (dislocation of temporomandibular joint)   . Sleep apnea     pt sts i do not use because "the rubber bothers me".  . Sleep apnea   . Short-term memory loss    BP 138/60 mmHg  Pulse 95  SpO2 95%  Opioid Risk Score:   Fall Risk Score:  `1  Depression screen PHQ 2/9  Depression screen [facility] 2/9 04/19/2015 11/24/2014  Decreased Interest 3 3  Down, Depressed, Hopeless 2 2  PHQ - 2 Score 5 5  Altered sleeping - 2  Tired, decreased energy - 3  Change in appetite - 2  Feeling bad or  failure about yourself  - 3  Trouble concentrating - 1  Moving slowly or fidgety/restless - 1  Suicidal thoughts - 0  PHQ-9 Score - 17     Review of Systems  Genitourinary:       Bladder control problems  Neurological: Positive for dizziness, tremors and weakness.       Tingling  Psychiatric/Behavioral: Positive for confusion and dysphoric mood. The patient is nervous/anxious.   All other systems reviewed and are negative.      Objective:   Physical  Exam  Constitutional: She is oriented to person, place, and time. She appears well-developed and well-nourished.  HENT:  Head: Normocephalic and atraumatic.  Neck: Normal range of motion. Neck supple.  Cardiovascular: Normal rate and regular rhythm.   Pulmonary/Chest: Effort normal and breath sounds normal.  Musculoskeletal:  Normal Muscle Bulk and Muscle Testing Reveals: Upper Extremities: Decreased ROM 45 Degrees and Muscle Strength Right 4/5 Left 5/5 Thoracic and Lumbar Hypersensitivity Lower Extremities: Right: Decreased ROM and Left: Full ROM and Muscle Strength 5/5 Arrived via wheelchair  Neurological: She is alert and oriented to person, place, and time.  Skin: Skin is warm and dry.  Psychiatric: She has a normal mood and affect.  Nursing note and vitals reviewed.         Assessment & Plan:  1.L-spine, and T-spine Spondylosis:  Refilled: Hydrocodone 10/325 mg one tablet three times a day #90. 2. Right lower extremity pain and chronic low back pain with known right S1 root compression.Chronic neuropathic right lower extremity pain: Continue Gabapentin. 3.Left shoulder pain: No Complaints Today. Continue with Heat and Voltaren gel 4. GreaterTrochanteric bursitis of both hips:  S/P Left Hip Injection good relief noted. Continue with heat therapy and Voltaren Gel use as directed 4 times a day. 5. Muscle Spasm: Continue Robaxin  20 minutes of face to face patient care time was spent during this visit. All questions  were encouraged and answered.   F/U in 1 month

## 2015-06-19 ENCOUNTER — Ambulatory Visit: Payer: Medicaid Other | Admitting: Gastroenterology

## 2015-06-26 ENCOUNTER — Other Ambulatory Visit: Payer: Self-pay | Admitting: Cardiovascular Disease

## 2015-06-27 ENCOUNTER — Other Ambulatory Visit: Payer: Self-pay | Admitting: Physician Assistant

## 2015-06-28 NOTE — Telephone Encounter (Signed)
Medication refilled per protocol. 

## 2015-07-06 NOTE — Progress Notes (Signed)
Urine drug screen for this encounter is consistent for prescribed medication 

## 2015-07-09 ENCOUNTER — Ambulatory Visit (INDEPENDENT_AMBULATORY_CARE_PROVIDER_SITE_OTHER): Payer: Medicaid Other | Admitting: Physician Assistant

## 2015-07-09 ENCOUNTER — Encounter: Payer: Self-pay | Admitting: Physician Assistant

## 2015-07-09 VITALS — BP 126/78 | HR 76 | Temp 98.1°F | Resp 18

## 2015-07-09 DIAGNOSIS — E559 Vitamin D deficiency, unspecified: Secondary | ICD-10-CM | POA: Diagnosis not present

## 2015-07-09 DIAGNOSIS — L200 Besnier's prurigo: Secondary | ICD-10-CM

## 2015-07-09 DIAGNOSIS — J439 Emphysema, unspecified: Secondary | ICD-10-CM | POA: Diagnosis not present

## 2015-07-09 DIAGNOSIS — Z720 Tobacco use: Secondary | ICD-10-CM

## 2015-07-09 DIAGNOSIS — L239 Allergic contact dermatitis, unspecified cause: Secondary | ICD-10-CM

## 2015-07-09 DIAGNOSIS — F17200 Nicotine dependence, unspecified, uncomplicated: Secondary | ICD-10-CM

## 2015-07-09 DIAGNOSIS — F329 Major depressive disorder, single episode, unspecified: Secondary | ICD-10-CM | POA: Diagnosis not present

## 2015-07-09 DIAGNOSIS — E785 Hyperlipidemia, unspecified: Secondary | ICD-10-CM

## 2015-07-09 DIAGNOSIS — M810 Age-related osteoporosis without current pathological fracture: Secondary | ICD-10-CM | POA: Diagnosis not present

## 2015-07-09 DIAGNOSIS — E038 Other specified hypothyroidism: Secondary | ICD-10-CM | POA: Diagnosis not present

## 2015-07-09 DIAGNOSIS — F32A Depression, unspecified: Secondary | ICD-10-CM

## 2015-07-09 DIAGNOSIS — E119 Type 2 diabetes mellitus without complications: Secondary | ICD-10-CM

## 2015-07-09 LAB — COMPLETE METABOLIC PANEL WITH GFR
ALBUMIN: 3.9 g/dL (ref 3.6–5.1)
ALK PHOS: 57 U/L (ref 33–130)
ALT: 12 U/L (ref 6–29)
AST: 12 U/L (ref 10–35)
BUN: 23 mg/dL (ref 7–25)
CO2: 30 mmol/L (ref 20–31)
Calcium: 9.1 mg/dL (ref 8.6–10.4)
Chloride: 101 mmol/L (ref 98–110)
Creat: 0.97 mg/dL (ref 0.50–1.05)
GFR, EST AFRICAN AMERICAN: 74 mL/min (ref >=60)
GFR, EST NON AFRICAN AMERICAN: 64 mL/min (ref >=60)
Glucose, Bld: 130 mg/dL — ABNORMAL HIGH (ref 70–99)
Potassium: 4.8 mmol/L (ref 3.5–5.3)
Sodium: 138 mmol/L (ref 135–146)
Total Bilirubin: 0.3 mg/dL (ref 0.2–1.2)
Total Protein: 6.7 g/dL (ref 6.1–8.1)

## 2015-07-09 LAB — LIPID PANEL
Cholesterol: 168 mg/dL (ref 125–200)
HDL: 26 mg/dL — AB (ref >=46)
TRIGLYCERIDES: 409 mg/dL — AB (ref <150)
Total CHOL/HDL Ratio: 6.5 Ratio — ABNORMAL HIGH (ref <=5.0)

## 2015-07-09 LAB — HEMOGLOBIN A1C
HEMOGLOBIN A1C: 7.1 % — AB (ref <5.7)
Mean Plasma Glucose: 157 mg/dL — ABNORMAL HIGH (ref <117)

## 2015-07-09 MED ORDER — DIPHENHYDRAMINE HCL 50 MG/ML IJ SOLN
50.0000 mg | Freq: Once | INTRAMUSCULAR | Status: AC
Start: 2015-07-09 — End: 2015-07-09
  Administered 2015-07-09: 50 mg via INTRAMUSCULAR

## 2015-07-09 MED ORDER — TRIAMCINOLONE ACETONIDE 0.1 % EX CREA: 1.0000 "application " | TOPICAL_CREAM | Freq: Two times a day (BID) | CUTANEOUS | Status: DC

## 2015-07-09 MED ORDER — METHYLPREDNISOLONE ACETATE 80 MG/ML IJ SUSP
60.0000 mg | Freq: Once | INTRAMUSCULAR | Status: AC
  Administered 2015-07-09: 60 mg via INTRAMUSCULAR
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
## 2015-07-10 LAB — VITAMIN D 25 HYDROXY (VIT D DEFICIENCY, FRACTURES): Vit D, 25-Hydroxy: 33 ng/mL (ref 30–100)

## 2015-07-11 ENCOUNTER — Ambulatory Visit: Payer: Medicaid Other | Admitting: Gastroenterology

## 2015-07-12 ENCOUNTER — Encounter: Payer: Self-pay | Admitting: Family Medicine

## 2015-07-13 ENCOUNTER — Encounter: Payer: Medicaid Other | Attending: Physical Medicine and Rehabilitation | Admitting: Registered Nurse

## 2015-07-13 ENCOUNTER — Encounter: Payer: Self-pay | Admitting: Registered Nurse

## 2015-07-13 VITALS — BP 132/87 | HR 104

## 2015-07-13 DIAGNOSIS — E119 Type 2 diabetes mellitus without complications: Secondary | ICD-10-CM | POA: Diagnosis not present

## 2015-07-13 DIAGNOSIS — J449 Chronic obstructive pulmonary disease, unspecified: Secondary | ICD-10-CM | POA: Insufficient documentation

## 2015-07-13 DIAGNOSIS — M961 Postlaminectomy syndrome, not elsewhere classified: Secondary | ICD-10-CM | POA: Diagnosis not present

## 2015-07-13 DIAGNOSIS — E039 Hypothyroidism, unspecified: Secondary | ICD-10-CM | POA: Insufficient documentation

## 2015-07-13 DIAGNOSIS — M79604 Pain in right leg: Secondary | ICD-10-CM | POA: Diagnosis not present

## 2015-07-13 DIAGNOSIS — M545 Low back pain: Secondary | ICD-10-CM | POA: Diagnosis not present

## 2015-07-13 DIAGNOSIS — K219 Gastro-esophageal reflux disease without esophagitis: Secondary | ICD-10-CM | POA: Insufficient documentation

## 2015-07-13 DIAGNOSIS — M47814 Spondylosis without myelopathy or radiculopathy, thoracic region: Secondary | ICD-10-CM | POA: Insufficient documentation

## 2015-07-13 DIAGNOSIS — F17210 Nicotine dependence, cigarettes, uncomplicated: Secondary | ICD-10-CM | POA: Insufficient documentation

## 2015-07-13 DIAGNOSIS — G8929 Other chronic pain: Secondary | ICD-10-CM

## 2015-07-13 DIAGNOSIS — M7061 Trochanteric bursitis, right hip: Secondary | ICD-10-CM

## 2015-07-13 DIAGNOSIS — M25551 Pain in right hip: Secondary | ICD-10-CM | POA: Insufficient documentation

## 2015-07-13 DIAGNOSIS — M47816 Spondylosis without myelopathy or radiculopathy, lumbar region: Secondary | ICD-10-CM | POA: Diagnosis not present

## 2015-07-13 DIAGNOSIS — M5416 Radiculopathy, lumbar region: Secondary | ICD-10-CM | POA: Diagnosis not present

## 2015-07-13 DIAGNOSIS — E785 Hyperlipidemia, unspecified: Secondary | ICD-10-CM | POA: Diagnosis not present

## 2015-07-13 DIAGNOSIS — M25512 Pain in left shoulder: Secondary | ICD-10-CM | POA: Diagnosis not present

## 2015-07-13 DIAGNOSIS — M810 Age-related osteoporosis without current pathological fracture: Secondary | ICD-10-CM | POA: Insufficient documentation

## 2015-07-13 DIAGNOSIS — Z5181 Encounter for therapeutic drug level monitoring: Secondary | ICD-10-CM

## 2015-07-13 DIAGNOSIS — M546 Pain in thoracic spine: Secondary | ICD-10-CM | POA: Diagnosis not present

## 2015-07-13 DIAGNOSIS — Z79899 Other long term (current) drug therapy: Secondary | ICD-10-CM

## 2015-07-13 DIAGNOSIS — G894 Chronic pain syndrome: Secondary | ICD-10-CM

## 2015-07-13 MED ORDER — HYDROCODONE-ACETAMINOPHEN 10-325 MG PO TABS: 1.0000 | ORAL_TABLET | Freq: Three times a day (TID) | ORAL | Status: DC

## 2015-07-13 NOTE — Progress Notes (Signed)
Subjective:    Patient ID: Lisa Ewing, female    DOB: 11-24-1955, 59 y.o.   MRN: [id number]  HPI:Lisa Ewing is a 59 year old female who returns for follow up for chronic pain and medication refill. She says her pain is located in her mid-lower back and radiating into her right hip and right lower extremity laterally. She rates her pain 8. She's not following her usual exercise regime encourage to resume she verbalizes understanding.  Pain Inventory Average Pain 8 Pain Right Now 8 My pain is constant, sharp, burning, stabbing, tingling and aching  In the last 24 hours, has pain interfered with the following? General activity 9 Relation with others 9 Enjoyment of life 10 What TIME of day is your pain at its worst? Morning, Daytime, Evening and Night Sleep (in general) Fair  Pain is worse with: walking, bending, sitting, standing and some activites Pain improves with: rest, heat/ice, medication and TENS Relief from Meds: NA  Mobility how many minutes can you walk? 0 ability to climb steps?  no do you drive?  no use a wheelchair needs help with transfers Do you have any goals in this area?  yes  Function I need assistance with the following:  dressing, bathing, meal prep, household duties and shopping Do you have any goals in this area?  yes  Neuro/Psych bladder control problems weakness numbness tremor tingling trouble walking spasms dizziness depression anxiety  Prior Studies Any changes since last visit?  no  Physicians involved in your care Any changes since last visit?  no   Family History  Problem Relation Age of Onset  . Breast cancer Maternal Aunt   . Throat cancer Maternal Grandmother   . Hyperlipidemia Mother   . Diabetes Father   . Colon cancer Neg Hx    Social History   Social History  . Marital Status: Divorced    Spouse Name: N/A  . Number of Children: N/A  . Years of Education: N/A   Social History Main Topics  . Smoking  status: Current Every Day Smoker -- 1.00 packs/day for 42 years    Types: Cigarettes  . Smokeless tobacco: Never Used  . Alcohol Use: 0.0 oz/week    0 Standard drinks or equivalent per week     Comment: cocktail once to twice a week to help relax  . Drug Use: No  . Sexual Activity: No   Other Topics Concern  . None   Social History Narrative   Past Surgical History  Procedure Laterality Date  . S/p hysterectomy  1999    with BSO  . Thyroidectomy  2007    for large benign tumors  . Rotator cuff repair Right 2006  . Thumb surgery  2010    left-removal of tumor  . Esophagogastroduodenoscopy  05/02/2010    WUJ:WJXBJ hiatal hernia, endoscopically looked like barretts esophagus but NEG biopsy  . Colonoscopy  05/02/2010    YNW:[id number] elongated colon. multiple left colon polyps. Next TCS 04/2015  . Abdominal hysterectomy    . Hip surgery Right   . Back surgery  1995    L-5 and S1  . Diagnostic laparoscopy    . Colonoscopy with propofol N/A 02/12/2015    RMR: Multiple colonic polyps ablated/ removed as described above.   . Esophagogastroduodenoscopy (egd) with propofol N/A 02/12/2015    RMR: Abnormal appearing distal esophagus. Query short segment Barretts status post dilation and subsequent biopsy. Small hiatal hernia. Subtly abnormal gastric mucosa of  uncertain significance status post biopsy.   . Esophageal dilation N/A 02/12/2015    Procedure: ESOPHAGEAL DILATION;  Surgeon: Daneil Dolin, MD;  Location: [facility];  Service: Endoscopy;  Laterality: N/A;  [place] # 65  . Esophageal biopsy N/A 02/12/2015    Procedure: BIOPSY;  Surgeon: Daneil Dolin, MD;  Location: [facility];  Service: Endoscopy;  Laterality: N/A;  . Polypectomy N/A 02/12/2015    Procedure: POLYPECTOMY;  Surgeon: Daneil Dolin, MD;  Location: [facility];  Service: Endoscopy;  Laterality: N/A;  sigmoid polyps  . Carpal bone excision Right 03/01/2015    Procedure: RIGHT TRAPEZIUM EXCISION;  Surgeon: Daryll Brod, MD;  Location: [facility];  Service: Orthopedics;  Laterality: Right;  ANESTHESIA:  CHOICE, REGIONAL BLOCK  . Carpometacarpel suspension plasty Right 03/01/2015    Procedure: RIGHT SUSPENSION PLASTY;  Surgeon: Daryll Brod, MD;  Location: [facility];  Service: Orthopedics;  Laterality: Right;  . Tendon transfer Right 03/01/2015    Procedure: RIGHT ABDUCTUS POLICUS LONGUS TRANSFER (SUSPENSION PLASTY);  Surgeon: Daryll Brod, MD;  Location: [facility];  Service: Orthopedics;  Laterality: Right;   Past Medical History  Diagnosis Date  . Barrett esophagus     gives h/o diagnosed elsewhere. Two negative biopsies in 2011 however.  . Chronic pain     from [place] in 2003  . Back fracture   . Broken hip ([place])     right  . Bursitis of left hip   . Sciatica   . Cervicalgia   . Lumbago   . Disorder of sacroiliac joint   . Pain in joint, upper arm   . Trochanteric bursitis   . 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 Pain in joint, pelvic region and thigh   . Hx of cervical cancer     age 73  . History of uterine cancer 1998    hysterectomy  . Hyperplastic colon polyp   . Vitamin D deficiency   . Shingles   . UTI (lower urinary tract infection)   . Allergic rhinitis   . Asthma   . COPD (chronic obstructive pulmonary disease) ([place])   . Depression   . Diabetes mellitus without complication ([place])   . GERD (gastroesophageal reflux disease)   . Hyperlipidemia   . Osteoporosis   . Hypothyroid 07/11/2013  . Smoker   . TMJ (dislocation of temporomandibular joint)   . Sleep apnea     pt sts i do not use because "the rubber bothers me".  . Sleep apnea   . Short-term memory loss    BP 132/87 mmHg  Pulse 104  SpO2 93%  Opioid Risk Score:   Fall Risk Score:  `1  Depression screen PHQ 2/9  Depression screen [place] 2/9 04/19/2015 11/24/2014  Decreased Interest 3 3  Down, Depressed, Hopeless 2 2  PHQ - 2 Score 5 5  Altered sleeping - 2  Tired, decreased energy - 3  Change  in appetite - 2  Feeling bad or failure about yourself  - 3  Trouble concentrating - 1  Moving slowly or fidgety/restless - 1  Suicidal thoughts - 0  PHQ-9 Score - 17     Review of Systems  Genitourinary:       Bladder control problems  Musculoskeletal:       Spasms  Neurological: Positive for dizziness, tremors, weakness and numbness.       Tingling Gait Instability  Psychiatric/Behavioral: The patient is nervous/anxious.  Depression  All other systems reviewed and are negative.      Objective:   Physical Exam  Constitutional: She is oriented to person, place, and time. She appears well-developed and well-nourished.  HENT:  Head: Normocephalic and atraumatic.  Neck: Normal range of motion. Neck supple.  Cardiovascular: Normal rate and regular rhythm.   Pulmonary/Chest: Effort normal and breath sounds normal.  Musculoskeletal:  Normal Muscle Bulk and Muscle Testing Reveals: Upper Extremities: Full ROM and Muscle Strength 5/5 Thoracic Paraspinal tenderness: T- 1- T-2- T-6-T-8 Right Greater Trochanteric Tenderness Lower Extremities: Right Decreased ROM and Muscle Strength 3/5 Left: Full ROM and Muscle Strength 5/5 Arrived in Motorized Wheelchair   Neurological: She is alert and oriented to person, place, and time.  Skin: Skin is warm and dry.  Psychiatric: She has a normal mood and affect.  Nursing note and vitals reviewed.         Assessment & Plan:  1.L-spine, and T-spine Spondylosis:  Refilled: Hydrocodone 10/325 mg one tablet three times a day #90. 2. Right lower extremity pain and chronic low back pain with known right S1 root compression.Chronic neuropathic right lower extremity pain: Continue Gabapentin. 3.Left shoulder pain: No Complaints Today. Continue with Heat and Voltaren gel 4. GreaterTrochanteric bursitis of both hips: Right Greater than Left. Continue with heat therapy and Voltaren Gel use as directed 4 times a day. 5. Muscle Spasm:  Continue Robaxin  20 minutes of face to face patient care time was spent during this visit. All questions were encouraged and answered.   F/U in 1 month

## 2015-07-16 ENCOUNTER — Encounter: Payer: Self-pay | Admitting: Occupational Therapy

## 2015-07-16 NOTE — Therapy (Signed)
[facility] [address] [place], [place], [address] Phone: [phone]   Fax:  [phone]  Patient Details  Name: Lisa Ewing MRN: [id number] Date of Birth: 11/17/1955 Referring Provider:  No ref. provider found  Encounter Date: 07/16/2015  Pt did not return after O.T. Evaluation and splinting on 03/29/15. Assume pt had no further splinting needs and will resolve episode of care.   Carey Bullocks, OTR/L 07/16/2015, 11:12 AM  [facility] [address] [place], [place], [address] Phone: [phone]   Fax:  [phone]

## 2015-07-31 ENCOUNTER — Other Ambulatory Visit: Payer: Self-pay | Admitting: Physician Assistant

## 2015-07-31 ENCOUNTER — Other Ambulatory Visit: Payer: Self-pay | Admitting: Family Medicine

## 2015-08-01 ENCOUNTER — Ambulatory Visit: Payer: Medicaid Other | Admitting: Podiatry

## 2015-08-01 NOTE — Telephone Encounter (Signed)
Medication refilled per protocol. 

## 2015-08-13 ENCOUNTER — Encounter: Payer: Self-pay | Admitting: Registered Nurse

## 2015-08-13 ENCOUNTER — Encounter: Payer: Medicaid Other | Attending: Physical Medicine and Rehabilitation | Admitting: Registered Nurse

## 2015-08-13 VITALS — BP 140/84 | HR 96

## 2015-08-13 DIAGNOSIS — K219 Gastro-esophageal reflux disease without esophagitis: Secondary | ICD-10-CM | POA: Diagnosis not present

## 2015-08-13 DIAGNOSIS — E039 Hypothyroidism, unspecified: Secondary | ICD-10-CM | POA: Diagnosis not present

## 2015-08-13 DIAGNOSIS — M545 Low back pain: Secondary | ICD-10-CM

## 2015-08-13 DIAGNOSIS — G894 Chronic pain syndrome: Secondary | ICD-10-CM | POA: Diagnosis not present

## 2015-08-13 DIAGNOSIS — F17210 Nicotine dependence, cigarettes, uncomplicated: Secondary | ICD-10-CM | POA: Diagnosis not present

## 2015-08-13 DIAGNOSIS — E119 Type 2 diabetes mellitus without complications: Secondary | ICD-10-CM | POA: Insufficient documentation

## 2015-08-13 DIAGNOSIS — M47814 Spondylosis without myelopathy or radiculopathy, thoracic region: Secondary | ICD-10-CM | POA: Diagnosis not present

## 2015-08-13 DIAGNOSIS — G8929 Other chronic pain: Secondary | ICD-10-CM | POA: Diagnosis present

## 2015-08-13 DIAGNOSIS — J449 Chronic obstructive pulmonary disease, unspecified: Secondary | ICD-10-CM | POA: Insufficient documentation

## 2015-08-13 DIAGNOSIS — M546 Pain in thoracic spine: Secondary | ICD-10-CM | POA: Diagnosis not present

## 2015-08-13 DIAGNOSIS — M79604 Pain in right leg: Secondary | ICD-10-CM | POA: Diagnosis not present

## 2015-08-13 DIAGNOSIS — M47816 Spondylosis without myelopathy or radiculopathy, lumbar region: Secondary | ICD-10-CM | POA: Diagnosis not present

## 2015-08-13 DIAGNOSIS — M25512 Pain in left shoulder: Secondary | ICD-10-CM | POA: Insufficient documentation

## 2015-08-13 DIAGNOSIS — M7061 Trochanteric bursitis, right hip: Secondary | ICD-10-CM | POA: Diagnosis not present

## 2015-08-13 DIAGNOSIS — E785 Hyperlipidemia, unspecified: Secondary | ICD-10-CM | POA: Insufficient documentation

## 2015-08-13 DIAGNOSIS — M25551 Pain in right hip: Secondary | ICD-10-CM | POA: Diagnosis not present

## 2015-08-13 DIAGNOSIS — M810 Age-related osteoporosis without current pathological fracture: Secondary | ICD-10-CM | POA: Diagnosis not present

## 2015-08-13 DIAGNOSIS — Z5181 Encounter for therapeutic drug level monitoring: Secondary | ICD-10-CM

## 2015-08-13 DIAGNOSIS — Z79899 Other long term (current) drug therapy: Secondary | ICD-10-CM

## 2015-08-13 MED ORDER — HYDROCODONE-ACETAMINOPHEN 10-325 MG PO TABS: 1.0000 | ORAL_TABLET | Freq: Three times a day (TID) | ORAL | Status: DC

## 2015-08-13 NOTE — Progress Notes (Signed)
Subjective:    Patient ID: Lisa Ewing, female    DOB: 1956-05-07, 60 y.o.   MRN: [id number]  HPI: [name] is a 59 year old female who returns for follow up for chronic pain and medication refill. She says her pain is located in her mid-lower back has intensified in the last few months. We will order an X-ray she verbalizes understanding. Also having her right hip and right lower extremity pain laterally. She rates her pain 8. She's not following her usual exercise regime due to increase intensity of pain.   Pain Inventory Average Pain 8 Pain Right Now 8 My pain is constant, sharp, burning, stabbing, tingling and aching  In the last 24 hours, has pain interfered with the following? General activity 10 Relation with others 10 Enjoyment of life 10 What TIME of day is your pain at its worst? Morning, Daytime, Evening and Night Sleep (in general) Fair  Pain is worse with: walking, bending, sitting, standing and some activites Pain improves with: rest, heat/ice, medication, TENS and injections Relief from Meds: 4  Mobility how many minutes can you walk? 0 ability to climb steps?  no do you drive?  no use a wheelchair needs help with transfers Do you have any goals in this area?  yes  Function I need assistance with the following:  dressing, bathing, meal prep, household duties and shopping Do you have any goals in this area?  yes  Neuro/Psych No problems in this area  Prior Studies Any changes since last visit?  no  Physicians involved in your care Any changes since last visit?  no   Family History  Problem Relation Age of Onset  . Breast cancer Maternal Aunt   . Throat cancer Maternal Grandmother   . Hyperlipidemia Mother   . Diabetes Father   . Colon cancer Neg Hx    Social History   Social History  . Marital Status: Divorced    Spouse Name: N/A  . Number of Children: N/A  . Years of Education: N/A   Social History Main Topics  . Smoking  status: Current Every Day Smoker -- 1.00 packs/day for 42 years    Types: Cigarettes  . Smokeless tobacco: Never Used  . Alcohol Use: 0.0 oz/week    0 Standard drinks or equivalent per week     Comment: cocktail once to twice a week to help relax  . Drug Use: No  . Sexual Activity: No   Other Topics Concern  . None   Social History Narrative   Past Surgical History  Procedure Laterality Date  . S/p hysterectomy  1999    with BSO  . Thyroidectomy  2007    for large benign tumors  . Rotator cuff repair Right 2006  . Thumb surgery  2010    left-removal of tumor  . Esophagogastroduodenoscopy  05/02/2010    [id number] hiatal hernia, endoscopically looked like barretts esophagus but NEG biopsy  . Colonoscopy  05/02/2010    [id number] elongated colon. multiple left colon polyps. Next TCS 04/2015  . Abdominal hysterectomy    . Hip surgery Right   . Back surgery  1995    L-5 and S1  . Diagnostic laparoscopy    . Colonoscopy with propofol N/A 02/12/2015    RMR: Multiple colonic polyps ablated/ removed as described above.   . Esophagogastroduodenoscopy (egd) with propofol N/A 02/12/2015    RMR: Abnormal appearing distal esophagus. Query short segment Barretts status post dilation and  subsequent biopsy. Small hiatal hernia. Subtly abnormal gastric mucosa of uncertain significance status post biopsy.   . Esophageal dilation N/A 02/12/2015    Procedure: ESOPHAGEAL DILATION;  Surgeon: Daneil Dolin, MD;  Location: [facility];  Service: Endoscopy;  Laterality: N/A;  [place] # 8  . Esophageal biopsy N/A 02/12/2015    Procedure: BIOPSY;  Surgeon: Daneil Dolin, MD;  Location: [facility];  Service: Endoscopy;  Laterality: N/A;  . Polypectomy N/A 02/12/2015    Procedure: POLYPECTOMY;  Surgeon: Daneil Dolin, MD;  Location: [facility];  Service: Endoscopy;  Laterality: N/A;  sigmoid polyps  . Carpal bone excision Right 03/01/2015    Procedure: RIGHT TRAPEZIUM EXCISION;  Surgeon: Daryll Brod, MD;  Location: [place];  Service: Orthopedics;  Laterality: Right;  ANESTHESIA:  CHOICE, REGIONAL BLOCK  . Carpometacarpel suspension plasty Right 03/01/2015    Procedure: RIGHT SUSPENSION PLASTY;  Surgeon: Daryll Brod, MD;  Location: [place];  Service: Orthopedics;  Laterality: Right;  . Tendon transfer Right 03/01/2015    Procedure: RIGHT ABDUCTUS POLICUS LONGUS TRANSFER (SUSPENSION PLASTY);  Surgeon: Daryll Brod, MD;  Location: [facility];  Service: Orthopedics;  Laterality: Right;   Past Medical History  Diagnosis Date  . Barrett esophagus     gives h/o diagnosed elsewhere. Two negative biopsies in 2011 however.  . Chronic pain     from [place] in 2003  . Back fracture   . Broken hip ([place])     right  . Bursitis of left hip   . Sciatica   . Cervicalgia   . Lumbago   . Disorder of sacroiliac joint   . Pain in joint, upper arm   . Trochanteric bursitis   . 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 Pain in joint, pelvic region and thigh   . Hx of cervical cancer     age 58  . History of uterine cancer 1998    hysterectomy  . Hyperplastic colon polyp   . Vitamin D deficiency   . Shingles   . UTI (lower urinary tract infection)   . Allergic rhinitis   . Asthma   . COPD (chronic obstructive pulmonary disease) ([place])   . Depression   . Diabetes mellitus without complication ([place])   . GERD (gastroesophageal reflux disease)   . Hyperlipidemia   . Osteoporosis   . Hypothyroid 07/11/2013  . Smoker   . TMJ (dislocation of temporomandibular joint)   . Sleep apnea     pt sts i do not use because "the rubber bothers me".  . Sleep apnea   . Short-term memory loss    BP 140/84 mmHg  Pulse 96  SpO2 93%  Opioid Risk Score:   Fall Risk Score:  `1  Depression screen PHQ 2/9  Depression screen [facility] 2/9 04/19/2015 11/24/2014  Decreased Interest 3 3  Down, Depressed, Hopeless 2 2  PHQ - 2 Score 5 5  Altered sleeping - 2  Tired, decreased energy - 3  Change  in appetite - 2  Feeling bad or failure about yourself  - 3  Trouble concentrating - 1  Moving slowly or fidgety/restless - 1  Suicidal thoughts - 0  PHQ-9 Score - 17     Review of Systems  Constitutional: Positive for diaphoresis and appetite change.  Respiratory: Positive for shortness of breath and wheezing.   Skin: Positive for rash.  All other systems reviewed and are negative.      Objective:  Physical Exam  Constitutional: She is oriented to person, place, and time. She appears well-developed and well-nourished.  HENT:  Head: Normocephalic and atraumatic.  Neck: Normal range of motion. Neck supple.  Cardiovascular: Normal rate and regular rhythm.   Pulmonary/Chest: Effort normal and breath sounds normal.  Musculoskeletal:  Normal Muscle Bulk and Muscle Testing Reveals: Upper Extremities: Decreased ROM 45 Degrees and Muscle Strength 5/5 Thoracic Paraspinal Tenderness: T-7- T-9 Bilateral Greater Trochanteric Tenderness Lower Extremities: Right: Decreased ROM and Right Lower Extremity Flexion Produces into Right Hip and Lower Extremity Arrived in Motorized wheelchair   Neurological: She is alert and oriented to person, place, and time.  Skin: Skin is warm and dry.  Psychiatric: She has a normal mood and affect.  Nursing note and vitals reviewed.         Assessment & Plan:  1.L-spine, and T-spine Spondylosis:  Refilled: Hydrocodone 10/325 mg one tablet three times a day #90. 2. Right lower extremity pain and chronic low back pain with known right S1 root compression.Chronic neuropathic right lower extremity pain: Continue Gabapentin. 3.Left shoulder pain: No Complaints Today. Continue with Heat and Voltaren gel 4. GreaterTrochanteric bursitis of both hips: Right Greater than Left. Continue with heat therapy and Voltaren Gel use as directed 4 times a day. 5. Muscle Spasm: Continue Robaxin 6. Thoracic Pain: RX: X-ray  20 minutes of face to face patient care  time was spent during this visit. All questions were encouraged and answered.   F/U in 1 month

## 2015-08-16 ENCOUNTER — Ambulatory Visit: Payer: Medicaid Other | Admitting: Gastroenterology

## 2015-08-22 ENCOUNTER — Ambulatory Visit (HOSPITAL_COMMUNITY)
Admission: RE | Admit: 2015-08-22 | Discharge: 2015-08-22 | Disposition: A | Discharge status: Home / Self Care | Payer: Medicaid Other | Source: Ambulatory Visit | Attending: Registered Nurse | Admitting: Registered Nurse

## 2015-08-22 DIAGNOSIS — M546 Pain in thoracic spine: Secondary | ICD-10-CM | POA: Insufficient documentation

## 2015-08-27 ENCOUNTER — Telehealth: Payer: Self-pay | Admitting: Registered Nurse

## 2015-08-28 ENCOUNTER — Other Ambulatory Visit: Payer: Self-pay | Admitting: Family Medicine

## 2015-08-28 ENCOUNTER — Telehealth: Payer: Self-pay | Admitting: Family Medicine

## 2015-08-28 ENCOUNTER — Other Ambulatory Visit: Payer: Self-pay | Admitting: Physician Assistant

## 2015-08-28 NOTE — Telephone Encounter (Signed)
Calling to re certify her CAP aide.  Approval given.  Order to follow for signature.

## 2015-08-29 ENCOUNTER — Ambulatory Visit: Payer: Medicaid Other | Admitting: Nurse Practitioner

## 2015-08-29 NOTE — Telephone Encounter (Signed)
Medication refilled per protocol. 

## 2015-09-07 ENCOUNTER — Telehealth: Payer: Self-pay | Admitting: Registered Nurse

## 2015-09-07 NOTE — Telephone Encounter (Signed)
Ms. Barreras return the call, X-ray results reviewed. She verbalizes understanding.

## 2015-09-07 NOTE — Telephone Encounter (Signed)
Placed a call to Ms. Owens Shark, no answer. Left message to return the call.

## 2015-09-11 ENCOUNTER — Encounter: Payer: Medicaid Other | Attending: Physical Medicine and Rehabilitation

## 2015-09-11 ENCOUNTER — Encounter: Payer: Self-pay | Admitting: Physical Medicine & Rehabilitation

## 2015-09-11 ENCOUNTER — Ambulatory Visit (HOSPITAL_BASED_OUTPATIENT_CLINIC_OR_DEPARTMENT_OTHER): Payer: Medicaid Other | Admitting: Physical Medicine & Rehabilitation

## 2015-09-11 VITALS — BP 127/83 | HR 104 | Resp 14

## 2015-09-11 DIAGNOSIS — E039 Hypothyroidism, unspecified: Secondary | ICD-10-CM | POA: Insufficient documentation

## 2015-09-11 DIAGNOSIS — M810 Age-related osteoporosis without current pathological fracture: Secondary | ICD-10-CM | POA: Insufficient documentation

## 2015-09-11 DIAGNOSIS — E785 Hyperlipidemia, unspecified: Secondary | ICD-10-CM | POA: Diagnosis not present

## 2015-09-11 DIAGNOSIS — G8929 Other chronic pain: Secondary | ICD-10-CM | POA: Diagnosis present

## 2015-09-11 DIAGNOSIS — M25512 Pain in left shoulder: Secondary | ICD-10-CM

## 2015-09-11 DIAGNOSIS — M25511 Pain in right shoulder: Secondary | ICD-10-CM | POA: Diagnosis not present

## 2015-09-11 DIAGNOSIS — M961 Postlaminectomy syndrome, not elsewhere classified: Secondary | ICD-10-CM | POA: Diagnosis not present

## 2015-09-11 DIAGNOSIS — J449 Chronic obstructive pulmonary disease, unspecified: Secondary | ICD-10-CM | POA: Diagnosis not present

## 2015-09-11 DIAGNOSIS — K219 Gastro-esophageal reflux disease without esophagitis: Secondary | ICD-10-CM | POA: Insufficient documentation

## 2015-09-11 DIAGNOSIS — M47814 Spondylosis without myelopathy or radiculopathy, thoracic region: Secondary | ICD-10-CM | POA: Insufficient documentation

## 2015-09-11 DIAGNOSIS — M79604 Pain in right leg: Secondary | ICD-10-CM | POA: Diagnosis not present

## 2015-09-11 DIAGNOSIS — M25551 Pain in right hip: Secondary | ICD-10-CM | POA: Insufficient documentation

## 2015-09-11 DIAGNOSIS — M47816 Spondylosis without myelopathy or radiculopathy, lumbar region: Secondary | ICD-10-CM | POA: Insufficient documentation

## 2015-09-11 DIAGNOSIS — F17210 Nicotine dependence, cigarettes, uncomplicated: Secondary | ICD-10-CM | POA: Insufficient documentation

## 2015-09-11 DIAGNOSIS — M549 Dorsalgia, unspecified: Secondary | ICD-10-CM

## 2015-09-11 DIAGNOSIS — M5489 Other dorsalgia: Secondary | ICD-10-CM | POA: Diagnosis not present

## 2015-09-11 DIAGNOSIS — M5416 Radiculopathy, lumbar region: Secondary | ICD-10-CM

## 2015-09-11 DIAGNOSIS — M546 Pain in thoracic spine: Principal | ICD-10-CM

## 2015-09-11 DIAGNOSIS — E119 Type 2 diabetes mellitus without complications: Secondary | ICD-10-CM | POA: Insufficient documentation

## 2015-09-11 MED ORDER — HYDROCODONE-ACETAMINOPHEN 10-325 MG PO TABS: 1.0000 | ORAL_TABLET | Freq: Three times a day (TID) | ORAL | Status: DC

## 2015-09-11 MED ORDER — METHOCARBAMOL 500 MG PO TABS: 500.0000 mg | ORAL_TABLET | Freq: Three times a day (TID) | ORAL | Status: DC

## 2015-09-11 NOTE — Progress Notes (Signed)
Subjective:    Patient ID: Lisa Ewing, female    DOB: October 01, 1955, 60 y.o.   MRN: [id number]  HPI 60 year old female with multiple chronic pain complaints She has chronic low back pain and a chronic right S1 radiculopathy, status post L5-S1 decompression She has chronic shoulder pain status post rotator cuff repair Chronic right hip pain status post hip fracture with hip contracture  Chief complaint today is mid back pain. She complained of this last month. Pain gets worse with breathing.  She has not seen her primary care physician She has chronic numbness and weakness in the right leg but no change in weakness in the last month or 2.  The patient is a chronic smoker. She denies any recent weight loss or change in appetite.  Functionally the patient has been in a motorized chair on a chronic basis. She does have a hired aid to assist her as well  Pain Inventory Average Pain 8 Pain Right Now 8 My pain is constant, sharp, burning, stabbing, tingling and aching  In the last 24 hours, has pain interfered with the following? General activity 9 Relation with others 10 Enjoyment of life 10 What TIME of day is your pain at its worst? all Sleep (in general) Poor  Pain is worse with: walking, bending, sitting, standing and some activites Pain improves with: rest, heat/ice, medication, TENS and injections Relief from Meds: 4  Mobility how many minutes can you walk? 0 ability to climb steps?  no do you drive?  no Do you have any goals in this area?  yes  Function I need assistance with the following:  dressing, bathing, meal prep, household duties and shopping Do you have any goals in this area?  yes  Neuro/Psych bladder control problems weakness numbness tremor tingling trouble walking spasms dizziness depression anxiety  Prior Studies Any changes since last visit?  no  Physicians involved in your care Any changes since last visit?  no   Family History    Problem Relation Age of Onset  . Breast cancer Maternal Aunt   . Throat cancer Maternal Grandmother   . Hyperlipidemia Mother   . Diabetes Father   . Colon cancer Neg Hx    Social History   Social History  . Marital Status: Divorced    Spouse Name: N/A  . Number of Children: N/A  . Years of Education: N/A   Social History Main Topics  . Smoking status: Current Every Day Smoker -- 1.00 packs/day for 42 years    Types: Cigarettes  . Smokeless tobacco: Never Used  . Alcohol Use: 0.0 oz/week    0 Standard drinks or equivalent per week     Comment: cocktail once to twice a week to help relax  . Drug Use: No  . Sexual Activity: No   Other Topics Concern  . None   Social History Narrative   Past Surgical History  Procedure Laterality Date  . S/p hysterectomy  1999    with BSO  . Thyroidectomy  2007    for large benign tumors  . Rotator cuff repair Right 2006  . Thumb surgery  2010    left-removal of tumor  . Esophagogastroduodenoscopy  05/02/2010    [id number] hiatal hernia, endoscopically looked like barretts esophagus but NEG biopsy  . Colonoscopy  05/02/2010    [id number] elongated colon. multiple left colon polyps. Next TCS 04/2015  . Abdominal hysterectomy    . Hip surgery Right   . Back surgery  1995    L-5 and S1  . Diagnostic laparoscopy    . Colonoscopy with propofol N/A 02/12/2015    RMR: Multiple colonic polyps ablated/ removed as described above.   . Esophagogastroduodenoscopy (egd) with propofol N/A 02/12/2015    RMR: Abnormal appearing distal esophagus. Query short segment Barretts status post dilation and subsequent biopsy. Small hiatal hernia. Subtly abnormal gastric mucosa of uncertain significance status post biopsy.   . Esophageal dilation N/A 02/12/2015    Procedure: ESOPHAGEAL DILATION;  Surgeon: Daneil Dolin, MD;  Location: [facility];  Service: Endoscopy;  Laterality: N/A;  [place] # 35  . Esophageal biopsy N/A 02/12/2015    Procedure: BIOPSY;  Surgeon:  Daneil Dolin, MD;  Location: [facility];  Service: Endoscopy;  Laterality: N/A;  . Polypectomy N/A 02/12/2015    Procedure: POLYPECTOMY;  Surgeon: Daneil Dolin, MD;  Location: [facility];  Service: Endoscopy;  Laterality: N/A;  sigmoid polyps  . Carpal bone excision Right 03/01/2015    Procedure: RIGHT TRAPEZIUM EXCISION;  Surgeon: Daryll Brod, MD;  Location: [place];  Service: Orthopedics;  Laterality: Right;  ANESTHESIA:  CHOICE, REGIONAL BLOCK  . Carpometacarpel suspension plasty Right 03/01/2015    Procedure: RIGHT SUSPENSION PLASTY;  Surgeon: Daryll Brod, MD;  Location: [place];  Service: Orthopedics;  Laterality: Right;  . Tendon transfer Right 03/01/2015    Procedure: RIGHT ABDUCTUS POLICUS LONGUS TRANSFER (SUSPENSION PLASTY);  Surgeon: Daryll Brod, MD;  Location: [place];  Service: Orthopedics;  Laterality: Right;   Past Medical History  Diagnosis Date  . Barrett esophagus     gives h/o diagnosed elsewhere. Two negative biopsies in 2011 however.  . Chronic pain     from [place] in 2003  . Back fracture   . Broken hip ([place])     right  . Bursitis of left hip   . Sciatica   . Cervicalgia   . Lumbago   . Disorder of sacroiliac joint   . Pain in joint, upper arm   . Trochanteric bursitis   . 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 Pain in joint, pelvic region and thigh   . Hx of cervical cancer     age 17  . History of uterine cancer 1998    hysterectomy  . Hyperplastic colon polyp   . Vitamin D deficiency   . Shingles   . UTI (lower urinary tract infection)   . Allergic rhinitis   . Asthma   . COPD (chronic obstructive pulmonary disease) ([place])   . Depression   . Diabetes mellitus without complication ([place])   . GERD (gastroesophageal reflux disease)   . Hyperlipidemia   . Osteoporosis   . Hypothyroid 07/11/2013  . Smoker   . TMJ (dislocation of temporomandibular joint)   . Sleep apnea     pt sts i do not use because "the rubber  bothers me".  . Sleep apnea   . Short-term memory loss    BP 127/83 mmHg  Pulse 104  Resp 14  SpO2 94%  Opioid Risk Score:   Fall Risk Score:  `1  Depression screen PHQ 2/9  Depression screen [facility] 2/9 04/19/2015 11/24/2014  Decreased Interest 3 3  Down, Depressed, Hopeless 2 2  PHQ - 2 Score 5 5  Altered sleeping - 2  Tired, decreased energy - 3  Change in appetite - 2  Feeling bad or failure about yourself  - 3  Trouble concentrating - 1  Moving  slowly or fidgety/restless - 1  Suicidal thoughts - 0  PHQ-9 Score - 17     Review of Systems  Constitutional: Positive for diaphoresis and appetite change.  Respiratory: Positive for cough, shortness of breath and wheezing.   Cardiovascular: Positive for leg swelling.  Endocrine:       High blood sugar  Genitourinary: Positive for difficulty urinating.  Musculoskeletal: Positive for gait problem.       Spasms  Skin: Positive for rash.  Neurological: Positive for dizziness, tremors, weakness and numbness.  Psychiatric/Behavioral: Positive for dysphoric mood. The patient is nervous/anxious.   All other systems reviewed and are negative.      Objective:   Physical Exam  Gen. No acute distress Mood and affect flat but otherwise appropriate Extremities without edema Sensation reduced in the right S1 dermatomal distribution Motor strength is 5/5 bilateral deltoid, biceps, triceps, grip Motor strength is 3 minus in the right hip flexor and extensor fourth ankle dorsiflexor plantar flexor limited by pain 4+ in the left lower extremity. Diminished range of motion right hip Tenderness to palpation at T8 bilaterally    Assessment & Plan:  1.L-spine, and T-spine Spondylosis:   Refilled: Hydrocodone 10/325 mg one tablet three times a day #90. 2. Right lower extremity pain and chronic low back pain with known right S1 root compression.Chronic neuropathic right lower extremity pain: Continue Gabapentin. 3.Left shoulder pain: No  Complaints Today. Continue with Heat and Voltaren gel 4. GreaterTrochanteric bursitis of both hips:  Right Greater than Left.  Continue with heat therapy and Voltaren Gel use as directed 4 times a day. 5. Muscle Spasm: Restart Robaxin 6. Thoracic Pain: RX: X-rayWas reviewed with the patient, actual films were studied, no bony abnormalities to explain her midthoracic pain. Given her symptoms, the association with taking a deep breath I would like her to get checked by her PCP for possible pleuritic pain. Given her smoking history and tenderness in the thoracic area, I will order MRI of the thoracic spine.   F/U in 1 month

## 2015-09-11 NOTE — Patient Instructions (Signed)
MRI T spine [facility] should call you

## 2015-09-17 ENCOUNTER — Ambulatory Visit: Payer: Medicaid Other | Admitting: Gastroenterology

## 2015-09-21 ENCOUNTER — Encounter: Payer: Self-pay | Admitting: Internal Medicine

## 2015-09-21 ENCOUNTER — Encounter: Payer: Self-pay | Admitting: Gastroenterology

## 2015-09-21 ENCOUNTER — Ambulatory Visit (INDEPENDENT_AMBULATORY_CARE_PROVIDER_SITE_OTHER): Payer: Medicaid Other | Admitting: Gastroenterology

## 2015-09-21 VITALS — BP 123/75 | HR 97 | Temp 97.6°F | Ht 65.0 in | Wt 279.4 lb

## 2015-09-21 DIAGNOSIS — R1011 Right upper quadrant pain: Secondary | ICD-10-CM | POA: Diagnosis not present

## 2015-09-21 DIAGNOSIS — K589 Irritable bowel syndrome without diarrhea: Secondary | ICD-10-CM | POA: Diagnosis not present

## 2015-09-21 DIAGNOSIS — K2100 Gastro-esophageal reflux disease with esophagitis, without bleeding: Secondary | ICD-10-CM

## 2015-09-21 MED ORDER — PANTOPRAZOLE SODIUM 40 MG PO TBEC: 40.0000 mg | DELAYED_RELEASE_TABLET | Freq: Two times a day (BID) | ORAL | Status: DC

## 2015-09-21 NOTE — Patient Instructions (Signed)
1. Please start a stool softener such as docusate sodium, 1-2 daily. 2. Increase pantoprazole to 40 mg twice daily, once before breakfast and once before your evening meal. Prescription sent to pharmacy. 3. Go to the lab for blood work 4-6 hours after your next episode of right upper abdominal pain. 4. Call with a progress report in 4-6 weeks. 5. Dietary changes as discussed for weight loss purposes. 6. Return to the office in 6 months or sooner if needed.

## 2015-09-21 NOTE — Assessment & Plan Note (Signed)
Breakthrough symptoms on current regimen and in setting of significant weight gain. Patient did stand to weight today. Increase pantoprazole to BID. Antireflux measures discussed. Discussed dietary modifications for purposes of weight loss. Needs to cut back on Diet [place] as well. PR in few weeks. Return to office in six months.
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
## 2015-09-21 NOTE — Assessment & Plan Note (Signed)
Suspect muscle spasm given description. We can check LFTs 4-6 hours after onset of next episode. Orders provided.

## 2015-09-21 NOTE — Assessment & Plan Note (Signed)
Add stool softener for mild constipation. Avoid bentyl while having constipation.

## 2015-09-25 NOTE — Progress Notes (Signed)
cc'ed to pcp °

## 2015-09-26 ENCOUNTER — Telehealth: Payer: Self-pay | Admitting: *Deleted

## 2015-09-26 NOTE — Telephone Encounter (Signed)
Confirmation CV:[id number]

## 2015-09-26 NOTE — Telephone Encounter (Signed)
Received request from pharmacy for PA on Zetia.   PA submitted.   Dx: HLD- E78.5

## 2015-09-27 MED ORDER — EZETIMIBE 10 MG PO TABS: 10.0000 mg | ORAL_TABLET | Freq: Every day | ORAL | Status: DC

## 2015-09-27 NOTE — Telephone Encounter (Signed)
Received PA determination.   PA approved 09/26/2015- 09/24/2016.  PA # [id number].   Pharmacy made aware.

## 2015-10-04 ENCOUNTER — Ambulatory Visit
Admission: RE | Admit: 2015-10-04 | Discharge: 2015-10-04 | Disposition: A | Discharge status: Home / Self Care | Payer: Medicaid Other | Source: Ambulatory Visit | Attending: Physical Medicine & Rehabilitation | Admitting: Physical Medicine & Rehabilitation

## 2015-10-04 ENCOUNTER — Telehealth: Payer: Self-pay | Admitting: *Deleted

## 2015-10-04 DIAGNOSIS — M546 Pain in thoracic spine: Principal | ICD-10-CM

## 2015-10-04 DIAGNOSIS — G8929 Other chronic pain: Secondary | ICD-10-CM

## 2015-10-04 NOTE — Telephone Encounter (Signed)
[facility] notified the MRI was normal per Dr Letta Pate

## 2015-10-10 ENCOUNTER — Ambulatory Visit (INDEPENDENT_AMBULATORY_CARE_PROVIDER_SITE_OTHER): Payer: Medicaid Other | Admitting: Physician Assistant

## 2015-10-10 ENCOUNTER — Encounter: Payer: Self-pay | Admitting: Physician Assistant

## 2015-10-10 VITALS — BP 126/74 | HR 80 | Temp 98.0°F | Resp 18

## 2015-10-10 DIAGNOSIS — F329 Major depressive disorder, single episode, unspecified: Secondary | ICD-10-CM | POA: Diagnosis not present

## 2015-10-10 DIAGNOSIS — E038 Other specified hypothyroidism: Secondary | ICD-10-CM | POA: Diagnosis not present

## 2015-10-10 DIAGNOSIS — M810 Age-related osteoporosis without current pathological fracture: Secondary | ICD-10-CM

## 2015-10-10 DIAGNOSIS — J439 Emphysema, unspecified: Secondary | ICD-10-CM

## 2015-10-10 DIAGNOSIS — E119 Type 2 diabetes mellitus without complications: Secondary | ICD-10-CM

## 2015-10-10 DIAGNOSIS — F17200 Nicotine dependence, unspecified, uncomplicated: Secondary | ICD-10-CM

## 2015-10-10 DIAGNOSIS — E559 Vitamin D deficiency, unspecified: Secondary | ICD-10-CM

## 2015-10-10 DIAGNOSIS — Z720 Tobacco use: Secondary | ICD-10-CM | POA: Diagnosis not present

## 2015-10-10 DIAGNOSIS — E785 Hyperlipidemia, unspecified: Secondary | ICD-10-CM

## 2015-10-10 DIAGNOSIS — F32A Depression, unspecified: Secondary | ICD-10-CM

## 2015-10-10 LAB — HEMOGLOBIN A1C, FINGERSTICK: Hgb A1C (fingerstick): 6.4 % — ABNORMAL HIGH (ref <5.7)
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
## 2015-10-11 ENCOUNTER — Encounter: Payer: Medicaid Other | Attending: Physical Medicine and Rehabilitation | Admitting: Registered Nurse

## 2015-10-11 ENCOUNTER — Encounter: Payer: Self-pay | Admitting: Registered Nurse

## 2015-10-11 VITALS — BP 132/77 | HR 102 | Resp 16

## 2015-10-11 DIAGNOSIS — M25512 Pain in left shoulder: Secondary | ICD-10-CM | POA: Diagnosis not present

## 2015-10-11 DIAGNOSIS — M47816 Spondylosis without myelopathy or radiculopathy, lumbar region: Secondary | ICD-10-CM | POA: Diagnosis not present

## 2015-10-11 DIAGNOSIS — M79601 Pain in right arm: Secondary | ICD-10-CM

## 2015-10-11 DIAGNOSIS — G8929 Other chronic pain: Secondary | ICD-10-CM | POA: Insufficient documentation

## 2015-10-11 DIAGNOSIS — M79604 Pain in right leg: Secondary | ICD-10-CM | POA: Insufficient documentation

## 2015-10-11 DIAGNOSIS — E785 Hyperlipidemia, unspecified: Secondary | ICD-10-CM | POA: Insufficient documentation

## 2015-10-11 DIAGNOSIS — M25551 Pain in right hip: Secondary | ICD-10-CM | POA: Diagnosis not present

## 2015-10-11 DIAGNOSIS — E039 Hypothyroidism, unspecified: Secondary | ICD-10-CM | POA: Insufficient documentation

## 2015-10-11 DIAGNOSIS — M961 Postlaminectomy syndrome, not elsewhere classified: Secondary | ICD-10-CM | POA: Diagnosis not present

## 2015-10-11 DIAGNOSIS — M810 Age-related osteoporosis without current pathological fracture: Secondary | ICD-10-CM | POA: Diagnosis not present

## 2015-10-11 DIAGNOSIS — Z79899 Other long term (current) drug therapy: Secondary | ICD-10-CM

## 2015-10-11 DIAGNOSIS — M5489 Other dorsalgia: Secondary | ICD-10-CM

## 2015-10-11 DIAGNOSIS — F17210 Nicotine dependence, cigarettes, uncomplicated: Secondary | ICD-10-CM | POA: Insufficient documentation

## 2015-10-11 DIAGNOSIS — M549 Dorsalgia, unspecified: Secondary | ICD-10-CM

## 2015-10-11 DIAGNOSIS — Z5181 Encounter for therapeutic drug level monitoring: Secondary | ICD-10-CM

## 2015-10-11 DIAGNOSIS — J449 Chronic obstructive pulmonary disease, unspecified: Secondary | ICD-10-CM | POA: Insufficient documentation

## 2015-10-11 DIAGNOSIS — M47814 Spondylosis without myelopathy or radiculopathy, thoracic region: Secondary | ICD-10-CM | POA: Insufficient documentation

## 2015-10-11 DIAGNOSIS — M546 Pain in thoracic spine: Secondary | ICD-10-CM

## 2015-10-11 DIAGNOSIS — E119 Type 2 diabetes mellitus without complications: Secondary | ICD-10-CM | POA: Insufficient documentation

## 2015-10-11 DIAGNOSIS — K219 Gastro-esophageal reflux disease without esophagitis: Secondary | ICD-10-CM | POA: Insufficient documentation

## 2015-10-11 DIAGNOSIS — M5416 Radiculopathy, lumbar region: Secondary | ICD-10-CM

## 2015-10-11 DIAGNOSIS — G894 Chronic pain syndrome: Secondary | ICD-10-CM

## 2015-10-11 MED ORDER — HYDROCODONE-ACETAMINOPHEN 10-325 MG PO TABS: 1.0000 | ORAL_TABLET | Freq: Three times a day (TID) | ORAL | Status: DC

## 2015-10-11 NOTE — Progress Notes (Signed)
Subjective:    Patient ID: Lisa Ewing, female    DOB: 17-Apr-1956, 60 y.o.   MRN: [id number]  HPI: Lisa Ewing is a 60 year old female who returns for follow up for chronic pain and medication refill. She says her pain is located in her mid-lower back radiating intoher right hip and right lower extremity pain laterally. She rates her pain 8. She's not following her usual exercise regime due to  ain she states. She's wheelchair bound. She had a MRI and it was negative she verbalizes understanding.Encouraged to increase activity as tolerated she verbalizes understanding.  Pain Inventory Average Pain 8 Pain Right Now 8 My pain is constant, sharp, burning, stabbing, tingling and aching  In the last 24 hours, has pain interfered with the following? General activity 10 Relation with others 9 Enjoyment of life 9 What TIME of day is your pain at its worst? morning, daytime, evening, night Sleep (in general) Poor  Pain is worse with: walking, bending, sitting, standing and some activites Pain improves with: rest, heat/ice, medication, TENS and injections Relief from Meds: 4  Mobility how many minutes can you walk? none ability to climb steps?  no do you drive?  no use a wheelchair needs help with transfers Do you have any goals in this area?  yes  Function I need assistance with the following:  dressing, bathing, meal prep, household duties and shopping Do you have any goals in this area?  yes  Neuro/Psych No problems in this area  Prior Studies Any changes since last visit?  yes CT/MRI  Physicians involved in your care Any changes since last visit?  no   Family History  Problem Relation Age of Onset  . Breast cancer Maternal Aunt   . Throat cancer Maternal Grandmother   . Hyperlipidemia Mother   . Diabetes Father   . Colon cancer Neg Hx    Social History   Social History  . Marital Status: Divorced    Spouse Name: N/A  . Number of Children: N/A  . Years  of Education: N/A   Social History Main Topics  . Smoking status: Current Every Day Smoker -- 1.00 packs/day for 42 years    Types: Cigarettes  . Smokeless tobacco: Never Used  . Alcohol Use: 0.0 oz/week    0 Standard drinks or equivalent per week     Comment: cocktail once to twice a week to help relax  . Drug Use: No  . Sexual Activity: No   Other Topics Concern  . None   Social History Narrative   Past Surgical History  Procedure Laterality Date  . S/p hysterectomy  1999    with BSO  . Thyroidectomy  2007    for large benign tumors  . Rotator cuff repair Right 2006  . Thumb surgery  2010    left-removal of tumor  . Esophagogastroduodenoscopy  05/02/2010    [id number] hiatal hernia, endoscopically looked like barretts esophagus but NEG biopsy  . Colonoscopy  05/02/2010    [id number] elongated colon. multiple left colon polyps. Next TCS 04/2015  . Abdominal hysterectomy    . Hip surgery Right   . Back surgery  1995    L-5 and S1  . Diagnostic laparoscopy    . Colonoscopy with propofol N/A 02/12/2015    RMR: Multiple colonic polyps ablated/ removed as described above.   . Esophagogastroduodenoscopy (egd) with propofol N/A 02/12/2015    RMR: Abnormal appearing distal esophagus. Query short segment  Barretts status post dilation and subsequent biopsy. Small hiatal hernia. Subtly abnormal gastric mucosa of uncertain significance status post biopsy.   . Esophageal dilation N/A 02/12/2015    Procedure: ESOPHAGEAL DILATION;  Surgeon: Daneil Dolin, MD;  [facility];  Service: Endoscopy;  Laterality: N/A;  [name] # [phone]  . Biopsy N/A 02/12/2015    Procedure: BIOPSY;  Surgeon: Daneil Dolin, MD;  [facility];  Service: Endoscopy;  Laterality: N/A;  . Polypectomy N/A 02/12/2015    Procedure: POLYPECTOMY;  Surgeon: Daneil Dolin, MD;  [facility];  Service: Endoscopy;  Laterality: N/A;  sigmoid polyps  . Carpal bone excision Right 03/01/2015    Procedure: RIGHT TRAPEZIUM  EXCISION;  Surgeon: Daryll Brod, MD;  Location: [place];  Service: Orthopedics;  Laterality: Right;  ANESTHESIA:  CHOICE, REGIONAL BLOCK  . Carpometacarpel suspension plasty Right 03/01/2015    Procedure: RIGHT SUSPENSION PLASTY;  Surgeon: Daryll Brod, MD;  Location: [place];  Service: Orthopedics;  Laterality: Right;  . Tendon transfer Right 03/01/2015    Procedure: RIGHT ABDUCTUS POLICUS LONGUS TRANSFER (SUSPENSION PLASTY);  Surgeon: Daryll Brod, MD;  Location: [place];  Service: Orthopedics;  Laterality: Right;   Past Medical History  Diagnosis Date  . Barrett esophagus     gives h/o diagnosed elsewhere. Two negative biopsies in 2011 however.  . Chronic pain     from [place] in 2003  . Back fracture   . Broken hip ([place])     right  . Bursitis of left hip   . Sciatica   . Cervicalgia   . Lumbago   . Disorder of sacroiliac joint   . Pain in joint, upper arm   . Trochanteric bursitis   . 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 Pain in joint, pelvic region and thigh   . Hx of cervical cancer     age 25  . History of uterine cancer 1998    hysterectomy  . Hyperplastic colon polyp   . Vitamin D deficiency   . Shingles   . UTI (lower urinary tract infection)   . Allergic rhinitis   . Asthma   . COPD (chronic obstructive pulmonary disease) ([place])   . Depression   . Diabetes mellitus without complication ([place])   . GERD (gastroesophageal reflux disease)   . Hyperlipidemia   . Osteoporosis   . Hypothyroid 07/11/2013  . Smoker   . TMJ (dislocation of temporomandibular joint)   . Sleep apnea     pt sts i do not use because "the rubber bothers me".  . Sleep apnea   . Short-term memory loss    BP 132/77 mmHg  Pulse 102  Resp 16  SpO2 93%  Opioid Risk Score:   Fall Risk Score:  `1  Depression screen PHQ 2/9  Depression screen [facility] 2/9 04/19/2015 11/24/2014  Decreased Interest 3 3  Down, Depressed, Hopeless 2 2  PHQ - 2 Score 5 5   Altered sleeping - 2  Tired, decreased energy - 3  Change in appetite - 2  Feeling bad or failure about yourself  - 3  Trouble concentrating - 1  Moving slowly or fidgety/restless - 1  Suicidal thoughts - 0  PHQ-9 Score - 17     Review of Systems  All other systems reviewed and are negative.      Objective:   Physical Exam  Constitutional: She is oriented to person, place, and time. She appears well-developed  and well-nourished.  HENT:  Head: Normocephalic and atraumatic.  Neck: Normal range of motion. Neck supple.  Cardiovascular: Normal rate and regular rhythm.   Pulmonary/Chest: Effort normal and breath sounds normal.  Musculoskeletal:  Normal Muscle Bulk and Muscle Testing Reveals: Upper Extremities: Decreased ROM 45 Degrees and Muscle Strength on the Right 4/5 and Left 5/5 Thoracic Paraspinal Tenderness: T-1- T-3 Thoracic Hypersensitivity: T-7- T-9 Lumbar Hypersensitivity Right Greater Trochanteric Tenderness Lower Extremities: Decreased ROM/ Passive ROM Flexion Produces Pain into Right Hip and Lower Extremity Arrived in Motorized Wheelchair   Neurological: She is alert and oriented to person, place, and time.  Skin: Skin is warm and dry.  Psychiatric: She has a normal mood and affect.  Nursing note and vitals reviewed.         Assessment & Plan:  1.L-spine, and T-spine Spondylosis:  Refilled: Hydrocodone 10/325 mg one tablet three times a day #90. 2. Right lower extremity pain and chronic low back pain with known right S1 root compression.Chronic neuropathic right lower extremity pain: Continue Gabapentin. 3.Left shoulder pain: No Complaints Today. Continue with Heat and Voltaren gel 4. GreaterTrochanteric bursitis of both hips: Right Greater than Left. Continue with heat therapy and Voltaren Gel use as directed 4 times a day. 5. Muscle Spasm: Continue Robaxin  20 minutes of face to face patient care time was spent during this visit. All questions were  encouraged and answered.   F/U in 1 month

## 2015-10-16 LAB — TOXASSURE SELECT,+ANTIDEPR,UR: PDF: 0

## 2015-10-17 NOTE — Progress Notes (Signed)
Urine drug screen for this encounter is consistent for prescribed medication 

## 2015-11-08 ENCOUNTER — Encounter: Payer: Self-pay | Admitting: Registered Nurse

## 2015-11-08 ENCOUNTER — Encounter: Payer: Medicaid Other | Attending: Physical Medicine and Rehabilitation | Admitting: Registered Nurse

## 2015-11-08 VITALS — BP 139/81 | HR 99

## 2015-11-08 DIAGNOSIS — E039 Hypothyroidism, unspecified: Secondary | ICD-10-CM | POA: Insufficient documentation

## 2015-11-08 DIAGNOSIS — M810 Age-related osteoporosis without current pathological fracture: Secondary | ICD-10-CM | POA: Diagnosis not present

## 2015-11-08 DIAGNOSIS — M47814 Spondylosis without myelopathy or radiculopathy, thoracic region: Secondary | ICD-10-CM | POA: Diagnosis not present

## 2015-11-08 DIAGNOSIS — M5416 Radiculopathy, lumbar region: Secondary | ICD-10-CM

## 2015-11-08 DIAGNOSIS — M79601 Pain in right arm: Secondary | ICD-10-CM

## 2015-11-08 DIAGNOSIS — G8929 Other chronic pain: Secondary | ICD-10-CM | POA: Insufficient documentation

## 2015-11-08 DIAGNOSIS — Z79899 Other long term (current) drug therapy: Secondary | ICD-10-CM

## 2015-11-08 DIAGNOSIS — K219 Gastro-esophageal reflux disease without esophagitis: Secondary | ICD-10-CM | POA: Insufficient documentation

## 2015-11-08 DIAGNOSIS — E119 Type 2 diabetes mellitus without complications: Secondary | ICD-10-CM | POA: Diagnosis not present

## 2015-11-08 DIAGNOSIS — J449 Chronic obstructive pulmonary disease, unspecified: Secondary | ICD-10-CM | POA: Diagnosis not present

## 2015-11-08 DIAGNOSIS — M47816 Spondylosis without myelopathy or radiculopathy, lumbar region: Secondary | ICD-10-CM | POA: Diagnosis not present

## 2015-11-08 DIAGNOSIS — M79604 Pain in right leg: Secondary | ICD-10-CM | POA: Diagnosis not present

## 2015-11-08 DIAGNOSIS — F17210 Nicotine dependence, cigarettes, uncomplicated: Secondary | ICD-10-CM | POA: Insufficient documentation

## 2015-11-08 DIAGNOSIS — M25551 Pain in right hip: Secondary | ICD-10-CM | POA: Diagnosis not present

## 2015-11-08 DIAGNOSIS — M25512 Pain in left shoulder: Secondary | ICD-10-CM | POA: Insufficient documentation

## 2015-11-08 DIAGNOSIS — E785 Hyperlipidemia, unspecified: Secondary | ICD-10-CM | POA: Insufficient documentation

## 2015-11-08 DIAGNOSIS — Z5181 Encounter for therapeutic drug level monitoring: Secondary | ICD-10-CM

## 2015-11-08 DIAGNOSIS — M961 Postlaminectomy syndrome, not elsewhere classified: Secondary | ICD-10-CM

## 2015-11-08 MED ORDER — HYDROCODONE-ACETAMINOPHEN 10-325 MG PO TABS: 1.0000 | ORAL_TABLET | Freq: Three times a day (TID) | ORAL | Status: DC

## 2015-11-08 NOTE — Progress Notes (Signed)
Subjective:    Patient ID: Lisa Ewing, female    DOB: January 06, 1956, 60 y.o.   MRN: [id number]  HPI: [name] is a 60 year old female who returns for follow up for chronic pain and medication refill. She states her pain is located in her mid-lower back radiating intoher right hip and right lower extremity pain laterally. She rates her pain 8. She has not followed her usual exercise regime due to not feeling well she states. Was encouraged again to increase her activity as tolerated. States she will be resuming water aerobics at the [facility]. She's wheelchair bound.  Pain Inventory Average Pain 7 Pain Right Now 8 My pain is constant, sharp, burning, stabbing, tingling and aching  In the last 24 hours, has pain interfered with the following? General activity 9 Relation with others 10 Enjoyment of life 10 What TIME of day is your pain at its worst? all Sleep (in general) Fair  Pain is worse with: walking, bending, sitting, standing and some activites Pain improves with: rest, heat/ice, medication, TENS and injections Relief from Meds: 4  Mobility how many minutes can you walk? 0 ability to climb steps?  no do you drive?  no use a wheelchair needs help with transfers  Function I need assistance with the following:  dressing, bathing, meal prep, household duties and shopping  Neuro/Psych No problems in this area  Prior Studies Any changes since last visit?  no  Physicians involved in your care Any changes since last visit?  no   Family History  Problem Relation Age of Onset  . Breast cancer Maternal Aunt   . Throat cancer Maternal Grandmother   . Hyperlipidemia Mother   . Diabetes Father   . Colon cancer Neg Hx    Social History   Social History  . Marital Status: Divorced    Spouse Name: N/A  . Number of Children: N/A  . Years of Education: N/A   Social History Main Topics  . Smoking status: Current Every Day Smoker -- 1.00 packs/day for 42 years   Types: Cigarettes  . Smokeless tobacco: Never Used  . Alcohol Use: 0.0 oz/week    0 Standard drinks or equivalent per week     Comment: cocktail once to twice a week to help relax  . Drug Use: No  . Sexual Activity: No   Other Topics Concern  . None   Social History Narrative   Past Surgical History  Procedure Laterality Date  . S/p hysterectomy  1999    with BSO  . Thyroidectomy  2007    for large benign tumors  . Rotator cuff repair Right 2006  . Thumb surgery  2010    left-removal of tumor  . Esophagogastroduodenoscopy  05/02/2010    [id number] hiatal hernia, endoscopically looked like barretts esophagus but NEG biopsy  . Colonoscopy  05/02/2010    [id number] elongated colon. multiple left colon polyps. Next TCS 04/2015  . Abdominal hysterectomy    . Hip surgery Right   . Back surgery  1995    L-5 and S1  . Diagnostic laparoscopy    . Colonoscopy with propofol N/A 02/12/2015    RMR: Multiple colonic polyps ablated/ removed as described above.   . Esophagogastroduodenoscopy (egd) with propofol N/A 02/12/2015    RMR: Abnormal appearing distal esophagus. Query short segment Barretts status post dilation and subsequent biopsy. Small hiatal hernia. Subtly abnormal gastric mucosa of uncertain significance status post biopsy.   . Esophageal  dilation N/A 02/12/2015    Procedure: ESOPHAGEAL DILATION;  Surgeon: Daneil Dolin, MD;  [facility];  Service: Endoscopy;  Laterality: N/A;  [place] # [phone]  . Biopsy N/A 02/12/2015    Procedure: BIOPSY;  Surgeon: Daneil Dolin, MD;  [facility];  Service: Endoscopy;  Laterality: N/A;  . Polypectomy N/A 02/12/2015    Procedure: POLYPECTOMY;  Surgeon: Daneil Dolin, MD;  [facility];  Service: Endoscopy;  Laterality: N/A;  sigmoid polyps  . Carpal bone excision Right 03/01/2015    Procedure: RIGHT TRAPEZIUM EXCISION;  Surgeon: Daryll Brod, MD;  Location: [place];  Service: Orthopedics;  Laterality: Right;  ANESTHESIA:   CHOICE, REGIONAL BLOCK  . Carpometacarpel suspension plasty Right 03/01/2015    Procedure: RIGHT SUSPENSION PLASTY;  Surgeon: Daryll Brod, MD;  Location: [place];  Service: Orthopedics;  Laterality: Right;  . Tendon transfer Right 03/01/2015    Procedure: RIGHT ABDUCTUS POLICUS LONGUS TRANSFER (SUSPENSION PLASTY);  Surgeon: Daryll Brod, MD;  Location: [place];  Service: Orthopedics;  Laterality: Right;   Past Medical History  Diagnosis Date  . Barrett esophagus     gives h/o diagnosed elsewhere. Two negative biopsies in 2011 however.  . Chronic pain     from [place] in 2003  . Back fracture   . Broken hip ([place])     right  . Bursitis of left hip   . Sciatica   . Cervicalgia   . Lumbago   . Disorder of sacroiliac joint   . Pain in joint, upper arm   . Trochanteric bursitis   . 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 Pain in joint, pelvic region and thigh   . Hx of cervical cancer     age 45  . History of uterine cancer 1998    hysterectomy  . Hyperplastic colon polyp   . Vitamin D deficiency   . Shingles   . UTI (lower urinary tract infection)   . Allergic rhinitis   . Asthma   . COPD (chronic obstructive pulmonary disease) ([place])   . Depression   . Diabetes mellitus without complication ([place])   . GERD (gastroesophageal reflux disease)   . Hyperlipidemia   . Osteoporosis   . Hypothyroid 07/11/2013  . Smoker   . TMJ (dislocation of temporomandibular joint)   . Sleep apnea     pt sts i do not use because "the rubber bothers me".  . Sleep apnea   . Short-term memory loss    BP 139/81 mmHg  Pulse 99  SpO2 99%  Opioid Risk Score:   Fall Risk Score:  `1  Depression screen PHQ 2/9  Depression screen [facility] 2/9 11/08/2015 04/19/2015 11/24/2014  Decreased Interest 3 3 3   Down, Depressed, Hopeless 2 2 2   PHQ - 2 Score 5 5 5   Altered sleeping - - 2  Tired, decreased energy - - 3  Change in appetite - - 2  Feeling bad or failure about yourself  -  - 3  Trouble concentrating - - 1  Moving slowly or fidgety/restless - - 1  Suicidal thoughts - - 0  PHQ-9 Score - - 17     Review of Systems  Constitutional: Positive for appetite change.  Respiratory: Positive for cough and shortness of breath.   Gastrointestinal: Positive for nausea and constipation.  Skin: Positive for rash.  All other systems reviewed and are negative.      Objective:   Physical Exam  Constitutional: She is oriented to person, place, and time. She appears well-developed and well-nourished.  HENT:  Head: Normocephalic and atraumatic.  Neck: Normal range of motion. Neck supple.  Cervical Paraspinal Tenderness: C-5- C-6  Cardiovascular: Normal rate and regular rhythm.   Pulmonary/Chest: Effort normal and breath sounds normal.  Musculoskeletal:  Normal Muscle Bulk and Muscle Testing Reveals: Upper Extremities: Full ROM and Muscle Strength 5/5 Thoracic Paraspinal Tenderness: T-1- T-3  T-7- T-9 Lumbar Hypersensitivity Lower Extremities: Right: Decreased ROM and Muscle Strength 4/5 Right Lower Extremity Flexion Produces Pain into Lumbar, Right Hip and Popliteal Fossa Left: Full ROM and Muscle Strength 5/5 Arrived in Motorized wheelchair  Neurological: She is alert and oriented to person, place, and time.  Skin: Skin is warm and dry.  Psychiatric: She has a normal mood and affect.  Nursing note and vitals reviewed.         Assessment & Plan:  1.L-spine, and T-spine Spondylosis:  Refilled: Hydrocodone 10/325 mg one tablet three times a day #90. 2. Right lower extremity pain and chronic low back pain with known right S1 root compression.Chronic neuropathic right lower extremity pain: Continue Gabapentin. 3.Left shoulder pain: No Complaints Today. Continue with Heat and Voltaren gel 4. GreaterTrochanteric bursitis of both hips: Right Greater than Left. Continue with heat therapy and Voltaren Gel use as directed 4 times a day. 5. Muscle Spasm: Continue  Robaxin  20 minutes of face to face patient care time was spent during this visit. All questions were encouraged and answered.   F/U in 1 month

## 2015-11-22 ENCOUNTER — Other Ambulatory Visit: Payer: Self-pay | Admitting: Physician Assistant

## 2015-11-22 ENCOUNTER — Other Ambulatory Visit: Payer: Self-pay | Admitting: Physical Medicine & Rehabilitation

## 2015-11-23 NOTE — Telephone Encounter (Signed)
Medication refilled per protocol. 

## 2015-12-11 ENCOUNTER — Encounter: Payer: Medicaid Other | Attending: Physical Medicine and Rehabilitation | Admitting: Registered Nurse

## 2015-12-11 ENCOUNTER — Encounter: Payer: Self-pay | Admitting: Registered Nurse

## 2015-12-11 VITALS — BP 127/82 | HR 100

## 2015-12-11 DIAGNOSIS — F17210 Nicotine dependence, cigarettes, uncomplicated: Secondary | ICD-10-CM | POA: Insufficient documentation

## 2015-12-11 DIAGNOSIS — M961 Postlaminectomy syndrome, not elsewhere classified: Secondary | ICD-10-CM | POA: Diagnosis not present

## 2015-12-11 DIAGNOSIS — K219 Gastro-esophageal reflux disease without esophagitis: Secondary | ICD-10-CM | POA: Insufficient documentation

## 2015-12-11 DIAGNOSIS — M79604 Pain in right leg: Secondary | ICD-10-CM

## 2015-12-11 DIAGNOSIS — M810 Age-related osteoporosis without current pathological fracture: Secondary | ICD-10-CM | POA: Insufficient documentation

## 2015-12-11 DIAGNOSIS — M47814 Spondylosis without myelopathy or radiculopathy, thoracic region: Secondary | ICD-10-CM | POA: Insufficient documentation

## 2015-12-11 DIAGNOSIS — M79601 Pain in right arm: Secondary | ICD-10-CM

## 2015-12-11 DIAGNOSIS — E039 Hypothyroidism, unspecified: Secondary | ICD-10-CM | POA: Diagnosis not present

## 2015-12-11 DIAGNOSIS — E119 Type 2 diabetes mellitus without complications: Secondary | ICD-10-CM | POA: Diagnosis not present

## 2015-12-11 DIAGNOSIS — M25512 Pain in left shoulder: Secondary | ICD-10-CM | POA: Insufficient documentation

## 2015-12-11 DIAGNOSIS — J449 Chronic obstructive pulmonary disease, unspecified: Secondary | ICD-10-CM | POA: Diagnosis not present

## 2015-12-11 DIAGNOSIS — M5416 Radiculopathy, lumbar region: Secondary | ICD-10-CM

## 2015-12-11 DIAGNOSIS — M25551 Pain in right hip: Secondary | ICD-10-CM | POA: Diagnosis not present

## 2015-12-11 DIAGNOSIS — G8929 Other chronic pain: Secondary | ICD-10-CM

## 2015-12-11 DIAGNOSIS — M47816 Spondylosis without myelopathy or radiculopathy, lumbar region: Secondary | ICD-10-CM | POA: Diagnosis not present

## 2015-12-11 DIAGNOSIS — Z79899 Other long term (current) drug therapy: Secondary | ICD-10-CM

## 2015-12-11 DIAGNOSIS — Z5181 Encounter for therapeutic drug level monitoring: Secondary | ICD-10-CM

## 2015-12-11 DIAGNOSIS — E785 Hyperlipidemia, unspecified: Secondary | ICD-10-CM | POA: Insufficient documentation

## 2015-12-11 MED ORDER — HYDROCODONE-ACETAMINOPHEN 10-325 MG PO TABS: 1.0000 | ORAL_TABLET | Freq: Three times a day (TID) | ORAL | Status: DC

## 2015-12-11 NOTE — Progress Notes (Signed)
Subjective:    Patient ID: Lisa Ewing, female    DOB: 1956-05-28, 60 y.o.   MRN: [id number]  HPI: : Lisa Ewing is a 60  year old female who returns for follow up for chronic pain and medication refill. She states her pain is located in her upper-lower back radiating intoher right hip and right lower extremity pain laterally. She rates her pain 7. Her current exercise regime is attending water aerobics at the [facility] twice a week. She's wheelchair bound.  Pain Inventory Average Pain 8 Pain Right Now 7 My pain is constant, sharp, burning, stabbing, tingling and aching  In the last 24 hours, has pain interfered with the following? General activity 10 Relation with others 10 Enjoyment of life 8 What TIME of day is your pain at its worst? All times Sleep (in general) Fair  Pain is worse with: walking, bending, sitting, standing and some activites Pain improves with: rest, heat/ice, medication, TENS and injections Relief from Meds: 4  Mobility how many minutes can you walk? 0 ability to climb steps?  no do you drive?  no use a wheelchair needs help with transfers Do you have any goals in this area?  yes  Function employed # of hrs/week 0 disabled: date disabled . I need assistance with the following:  dressing, bathing, meal prep, household duties and shopping Do you have any goals in this area?  yes  Neuro/Psych bladder control problems weakness numbness tremor tingling trouble walking spasms dizziness confusion depression anxiety  Prior Studies Any changes since last visit?  no  Physicians involved in your care Any changes since last visit?  no   Family History  Problem Relation Age of Onset  . Breast cancer Maternal Aunt   . Throat cancer Maternal Grandmother   . Hyperlipidemia Mother   . Diabetes Father   . Colon cancer Neg Hx    Social History   Social History  . Marital Status: Divorced    Spouse Name: N/A  . Number of Children: N/A  .  Years of Education: N/A   Social History Main Topics  . Smoking status: Current Every Day Smoker -- 1.00 packs/day for 42 years    Types: Cigarettes  . Smokeless tobacco: Never Used  . Alcohol Use: 0.0 oz/week    0 Standard drinks or equivalent per week     Comment: cocktail once to twice a week to help relax  . Drug Use: No  . Sexual Activity: No   Other Topics Concern  . None   Social History Narrative   Past Surgical History  Procedure Laterality Date  . S/p hysterectomy  1999    with BSO  . Thyroidectomy  2007    for large benign tumors  . Rotator cuff repair Right 2006  . Thumb surgery  2010    left-removal of tumor  . Esophagogastroduodenoscopy  05/02/2010    [id number] hiatal hernia, endoscopically looked like barretts esophagus but NEG biopsy  . Colonoscopy  05/02/2010    [id number] elongated colon. multiple left colon polyps. Next TCS 04/2015  . Abdominal hysterectomy    . Hip surgery Right   . Back surgery  1995    L-5 and S1  . Diagnostic laparoscopy    . Colonoscopy with propofol N/A 02/12/2015    RMR: Multiple colonic polyps ablated/ removed as described above.   . Esophagogastroduodenoscopy (egd) with propofol N/A 02/12/2015    RMR: Abnormal appearing distal esophagus. Query short segment Barretts  status post dilation and subsequent biopsy. Small hiatal hernia. Subtly abnormal gastric mucosa of uncertain significance status post biopsy.   . Esophageal dilation N/A 02/12/2015    Procedure: ESOPHAGEAL DILATION;  Surgeon: Daneil Dolin, MD;  [facility];  Service: Endoscopy;  Laterality: N/A;  [place] # [phone]  . Biopsy N/A 02/12/2015    Procedure: BIOPSY;  Surgeon: Daneil Dolin, MD;  [facility];  Service: Endoscopy;  Laterality: N/A;  . Polypectomy N/A 02/12/2015    Procedure: POLYPECTOMY;  Surgeon: Daneil Dolin, MD;  [facility];  Service: Endoscopy;  Laterality: N/A;  sigmoid polyps  . Carpal bone excision Right 03/01/2015    Procedure: RIGHT TRAPEZIUM  EXCISION;  Surgeon: Daryll Brod, MD;  Location: [place];  Service: Orthopedics;  Laterality: Right;  ANESTHESIA:  CHOICE, REGIONAL BLOCK  . Carpometacarpel suspension plasty Right 03/01/2015    Procedure: RIGHT SUSPENSION PLASTY;  Surgeon: Daryll Brod, MD;  Location: [place];  Service: Orthopedics;  Laterality: Right;  . Tendon transfer Right 03/01/2015    Procedure: RIGHT ABDUCTUS POLICUS LONGUS TRANSFER (SUSPENSION PLASTY);  Surgeon: Daryll Brod, MD;  Location: [place];  Service: Orthopedics;  Laterality: Right;   Past Medical History  Diagnosis Date  . Barrett esophagus     gives h/o diagnosed elsewhere. Two negative biopsies in 2011 however.  . Chronic pain     from [place] in 2003  . Back fracture   . Broken hip ([place])     right  . Bursitis of left hip   . Sciatica   . Cervicalgia   . Lumbago   . Disorder of sacroiliac joint   . Pain in joint, upper arm   . Trochanteric bursitis   . 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 Pain in joint, pelvic region and thigh   . Hx of cervical cancer     age 89  . History of uterine cancer 1998    hysterectomy  . Hyperplastic colon polyp   . Vitamin D deficiency   . Shingles   . UTI (lower urinary tract infection)   . Allergic rhinitis   . Asthma   . COPD (chronic obstructive pulmonary disease) ([place])   . Depression   . Diabetes mellitus without complication ([place])   . GERD (gastroesophageal reflux disease)   . Hyperlipidemia   . Osteoporosis   . Hypothyroid 07/11/2013  . Smoker   . TMJ (dislocation of temporomandibular joint)   . Sleep apnea     pt sts i do not use because "the rubber bothers me".  . Sleep apnea   . Short-term memory loss    BP 133/95 mmHg  Pulse 100  SpO2 97%  Opioid Risk Score:   Fall Risk Score:  `1  Depression screen PHQ 2/9  Depression screen [facility] 2/9 12/11/2015 11/08/2015 04/19/2015 11/24/2014  Decreased Interest 3 3 3 3   Down, Depressed, Hopeless 1 2 2 2     PHQ - 2 Score 4 5 5 5   Altered sleeping 3 - - 2  Tired, decreased energy 2 - - 3  Change in appetite 2 - - 2  Feeling bad or failure about yourself  2 - - 3  Trouble concentrating 2 - - 1  Moving slowly or fidgety/restless 1 - - 1  Suicidal thoughts 0 - - 0  PHQ-9 Score 16 - - 17  Difficult doing work/chores Extremely dIfficult - - -     Review of Systems  Objective:   Physical Exam  Constitutional: She is oriented to person, place, and time. She appears well-developed and well-nourished.  HENT:  Head: Normocephalic and atraumatic.  Neck: Normal range of motion. Neck supple.  Cardiovascular: Normal rate and regular rhythm.   Pulmonary/Chest: Effort normal and breath sounds normal.  Musculoskeletal:  Normal Muscle Bulk and Muscle Testing Reveals: Upper Extremities: Decreased ROM 45 Degrees and Muscle Strength 4/5 Thoracic Paraspinal Tenderness: T-1- T-3 T-7- T-9 Lumbar Paraspinal Tenderness: L-3- L-5 Lower Extremities: Right: Decreased ROM and Muscle Strength 3/5 Right Lower Extremity Flexion Produces Pain into Right Hip and Right Patella Left: Full ROM and Muscle Strength 5/5 Arrived in motorized wheelchair   Neurological: She is alert and oriented to person, place, and time.  Skin: Skin is warm and dry.  Psychiatric: She has a normal mood and affect.  Nursing note and vitals reviewed.         Assessment & Plan:  1.L-spine, and T-spine Spondylosis:  Refilled: Hydrocodone 10/325 mg one tablet three times a day #90. 2. Right lower extremity pain and chronic low back pain with known right S1 root compression.Chronic neuropathic right lower extremity pain: Continue Gabapentin. 3.Left shoulder pain: No Complaints Today. Continue with Heat and Voltaren gel 4. GreaterTrochanteric bursitis of both hips: Right Greater than Left. Continue with heat therapy and Voltaren Gel use as directed 4 times a day. 5. Muscle Spasm: Continue Robaxin  20 minutes of face to face  patient care time was spent during this visit. All questions were encouraged and answered.   F/U in 1 month

## 2015-12-20 ENCOUNTER — Other Ambulatory Visit: Payer: Self-pay | Admitting: Physician Assistant

## 2015-12-21 NOTE — Telephone Encounter (Signed)
Refill appropriate and filled per protocol. 

## 2016-01-08 ENCOUNTER — Encounter: Payer: Medicaid Other | Attending: Physical Medicine and Rehabilitation | Admitting: Registered Nurse

## 2016-01-08 ENCOUNTER — Encounter: Payer: Self-pay | Admitting: Registered Nurse

## 2016-01-08 VITALS — BP 153/82 | HR 98

## 2016-01-08 DIAGNOSIS — M79604 Pain in right leg: Secondary | ICD-10-CM | POA: Diagnosis not present

## 2016-01-08 DIAGNOSIS — Z5181 Encounter for therapeutic drug level monitoring: Secondary | ICD-10-CM

## 2016-01-08 DIAGNOSIS — M25512 Pain in left shoulder: Secondary | ICD-10-CM | POA: Diagnosis not present

## 2016-01-08 DIAGNOSIS — E119 Type 2 diabetes mellitus without complications: Secondary | ICD-10-CM | POA: Insufficient documentation

## 2016-01-08 DIAGNOSIS — Z79899 Other long term (current) drug therapy: Secondary | ICD-10-CM

## 2016-01-08 DIAGNOSIS — G8929 Other chronic pain: Secondary | ICD-10-CM | POA: Diagnosis not present

## 2016-01-08 DIAGNOSIS — F17210 Nicotine dependence, cigarettes, uncomplicated: Secondary | ICD-10-CM | POA: Diagnosis not present

## 2016-01-08 DIAGNOSIS — M47814 Spondylosis without myelopathy or radiculopathy, thoracic region: Secondary | ICD-10-CM | POA: Diagnosis not present

## 2016-01-08 DIAGNOSIS — M810 Age-related osteoporosis without current pathological fracture: Secondary | ICD-10-CM | POA: Insufficient documentation

## 2016-01-08 DIAGNOSIS — M7062 Trochanteric bursitis, left hip: Secondary | ICD-10-CM

## 2016-01-08 DIAGNOSIS — J449 Chronic obstructive pulmonary disease, unspecified: Secondary | ICD-10-CM | POA: Diagnosis not present

## 2016-01-08 DIAGNOSIS — M79601 Pain in right arm: Secondary | ICD-10-CM

## 2016-01-08 DIAGNOSIS — M47816 Spondylosis without myelopathy or radiculopathy, lumbar region: Secondary | ICD-10-CM | POA: Insufficient documentation

## 2016-01-08 DIAGNOSIS — M5416 Radiculopathy, lumbar region: Secondary | ICD-10-CM

## 2016-01-08 DIAGNOSIS — E785 Hyperlipidemia, unspecified: Secondary | ICD-10-CM | POA: Insufficient documentation

## 2016-01-08 DIAGNOSIS — M961 Postlaminectomy syndrome, not elsewhere classified: Secondary | ICD-10-CM

## 2016-01-08 DIAGNOSIS — M546 Pain in thoracic spine: Secondary | ICD-10-CM

## 2016-01-08 DIAGNOSIS — E039 Hypothyroidism, unspecified: Secondary | ICD-10-CM | POA: Diagnosis not present

## 2016-01-08 DIAGNOSIS — K219 Gastro-esophageal reflux disease without esophagitis: Secondary | ICD-10-CM | POA: Diagnosis not present

## 2016-01-08 DIAGNOSIS — M25551 Pain in right hip: Secondary | ICD-10-CM | POA: Diagnosis not present

## 2016-01-08 DIAGNOSIS — G894 Chronic pain syndrome: Secondary | ICD-10-CM

## 2016-01-08 MED ORDER — HYDROCODONE-ACETAMINOPHEN 10-325 MG PO TABS: 1.0000 | ORAL_TABLET | Freq: Three times a day (TID) | ORAL | Status: DC

## 2016-01-08 NOTE — Progress Notes (Signed)
Subjective:    Patient ID: Lisa Ewing, female    DOB: 09-Jan-1956, 60 y.o.   MRN: [id number]  HPI: Lisa Ewing is a 60 year old female who returns for follow up for chronic pain and medication refill. She states her pain is located in her upper-lower back radiating intoher right hip and right lower extremity pain laterally. She rates her pain 8. She is not following her usual  exercise regime at this time. She will be resuming her  water aerobics at the [facility] soon. She's wheelchair bound.   Pain Inventory Average Pain 8 Pain Right Now 8 My pain is constant, sharp, burning, stabbing, tingling and aching  In the last 24 hours, has pain interfered with the following? General activity 10 Relation with others 8 Enjoyment of life 8 What TIME of day is your pain at its worst? all times Sleep (in general) Fair  Pain is worse with: walking, bending, sitting, standing and some activites Pain improves with: rest, heat/ice, medication, TENS and injections Relief from Meds: 4  Mobility how many minutes can you walk? 0 ability to climb steps?  no do you drive?  no use a wheelchair needs help with transfers Do you have any goals in this area?  yes  Function retired I need assistance with the following:  dressing, bathing, meal prep, household duties and shopping Do you have any goals in this area?  yes  Neuro/Psych bladder control problems weakness numbness tremor tingling spasms dizziness depression anxiety  Prior Studies Any changes since last visit?  no  Physicians involved in your care Any changes since last visit?  no   Family History  Problem Relation Age of Onset  . Breast cancer Maternal Aunt   . Throat cancer Maternal Grandmother   . Hyperlipidemia Mother   . Diabetes Father   . Colon cancer Neg Hx    Social History   Social History  . Marital Status: Divorced    Spouse Name: N/A  . Number of Children: N/A  . Years of Education: N/A   Social  History Main Topics  . Smoking status: Current Every Day Smoker -- 1.00 packs/day for 42 years    Types: Cigarettes  . Smokeless tobacco: Never Used  . Alcohol Use: 0.0 oz/week    0 Standard drinks or equivalent per week     Comment: cocktail once to twice a week to help relax  . Drug Use: No  . Sexual Activity: No   Other Topics Concern  . None   Social History Narrative   Past Surgical History  Procedure Laterality Date  . S/p hysterectomy  1999    with BSO  . Thyroidectomy  2007    for large benign tumors  . Rotator cuff repair Right 2006  . Thumb surgery  2010    left-removal of tumor  . Esophagogastroduodenoscopy  05/02/2010    [id number] hiatal hernia, endoscopically looked like barretts esophagus but NEG biopsy  . Colonoscopy  05/02/2010    [id number] elongated colon. multiple left colon polyps. Next TCS 04/2015  . Abdominal hysterectomy    . Hip surgery Right   . Back surgery  1995    L-5 and S1  . Diagnostic laparoscopy    . Colonoscopy with propofol N/A 02/12/2015    RMR: Multiple colonic polyps ablated/ removed as described above.   . Esophagogastroduodenoscopy (egd) with propofol N/A 02/12/2015    RMR: Abnormal appearing distal esophagus. Query short segment Barretts status post  dilation and subsequent biopsy. Small hiatal hernia. Subtly abnormal gastric mucosa of uncertain significance status post biopsy.   . Esophageal dilation N/A 02/12/2015    Procedure: ESOPHAGEAL DILATION;  Surgeon: Daneil Dolin, MD;  Location: [facility];  Service: Endoscopy;  Laterality: N/A;  [place] # [phone]  . Biopsy N/A 02/12/2015    Procedure: BIOPSY;  Surgeon: Daneil Dolin, MD;  Location: [facility];  Service: Endoscopy;  Laterality: N/A;  . Polypectomy N/A 02/12/2015    Procedure: POLYPECTOMY;  Surgeon: Daneil Dolin, MD;  Location: [facility];  Service: Endoscopy;  Laterality: N/A;  sigmoid polyps  . Carpal bone excision Right 03/01/2015    Procedure: RIGHT TRAPEZIUM EXCISION;  Surgeon: Daryll Brod, MD;   Location: [place];  Service: Orthopedics;  Laterality: Right;  ANESTHESIA:  CHOICE, REGIONAL BLOCK  . Carpometacarpel suspension plasty Right 03/01/2015    Procedure: RIGHT SUSPENSION PLASTY;  Surgeon: Daryll Brod, MD;  Location: [place];  Service: Orthopedics;  Laterality: Right;  . Tendon transfer Right 03/01/2015    Procedure: RIGHT ABDUCTUS POLICUS LONGUS TRANSFER (SUSPENSION PLASTY);  Surgeon: Daryll Brod, MD;  Location: [place];  Service: Orthopedics;  Laterality: Right;   Past Medical History  Diagnosis Date  . Barrett esophagus     gives h/o diagnosed elsewhere. Two negative biopsies in 2011 however.  . Chronic pain     from [place] in 2003  . Back fracture   . Broken hip ([place])     right  . Bursitis of left hip   . Sciatica   . Cervicalgia   . Lumbago   . Disorder of sacroiliac joint   . Pain in joint, upper arm   . Trochanteric bursitis   . 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 Pain in joint, pelvic region and thigh   . Hx of cervical cancer     age 51  . History of uterine cancer 1998    hysterectomy  . Hyperplastic colon polyp   . Vitamin D deficiency   . Shingles   . UTI (lower urinary tract infection)   . Allergic rhinitis   . Asthma   . COPD (chronic obstructive pulmonary disease) ([place])   . Depression   . Diabetes mellitus without complication ([place])   . GERD (gastroesophageal reflux disease)   . Hyperlipidemia   . Osteoporosis   . Hypothyroid 07/11/2013  . Smoker   . TMJ (dislocation of temporomandibular joint)   . Sleep apnea     pt sts i do not use because "the rubber bothers me".  . Sleep apnea   . Short-term memory loss    BP 153/82 mmHg  Pulse 98  SpO2 91%  Opioid Risk Score:   Fall Risk Score:  `1  Depression screen PHQ 2/9  Depression screen [facility] 2/9 12/11/2015 11/08/2015 04/19/2015 11/24/2014  Decreased Interest 3 3 3 3   Down, Depressed, Hopeless 1 2 2 2   PHQ - 2 Score 4 5 5 5   Altered  sleeping 3 - - 2  Tired, decreased energy 2 - - 3  Change in appetite 2 - - 2  Feeling bad or failure about yourself  2 - - 3  Trouble concentrating 2 - - 1  Moving slowly or fidgety/restless 1 - - 1  Suicidal thoughts 0 - - 0  PHQ-9 Score 16 - - 17  Difficult doing work/chores Extremely dIfficult - - -     Review of Systems  Objective:   Physical Exam  Constitutional: She is oriented to person, place, and time. She appears well-developed and well-nourished.  HENT:  Head: Normocephalic and atraumatic.  Neck: Normal range of motion. Neck supple.  Cardiovascular: Normal rate and regular rhythm.   Pulmonary/Chest: Effort normal and breath sounds normal.  Musculoskeletal:  Normal Muscle Bulk and Muscle Testing Reveals: Upper Extremities: Decreased ROM 45 Degrees and Muscle Strength 5/5 Thoracic and Lumbar Hypersensitivity Right Greater Trochanteric Tenderness Lower Extremities: Right: Decreased ROM and Muscle Strength 4/5 Left: Full ROM and Muscle Strength 5/5 Arrived in wheelchair  Neurological: She is alert and oriented to person, place, and time.  Skin: Skin is warm and dry.  Psychiatric: She has a normal mood and affect.  Nursing note and vitals reviewed.         Assessment & Plan:  1.L-spine, and T-spine Spondylosis:  Refilled: Hydrocodone 10/325 mg one tablet three times a day #90. We will continue the opioid monitoring program, this consists of regular clinic visits, examinations, urine drug screen, pill counts, as well as use of [place] Controlled Substance Reporting System. 2. Right lower extremity pain and chronic low back pain with known right S1 root compression.Chronic neuropathic right lower extremity pain: Continue Gabapentin. 3.Left shoulder pain: No Complaints Today. Continue with Heat and Voltaren gel 4. GreaterTrochanteric bursitis of both hips: Left Greater than Right. Scheduled for Left Hip Injection with Dr. Letta Pate. Continue with heat  therapy and Voltaren Gel use as directed 4 times a day. 5. Muscle Spasm: Continue Robaxin  20 minutes of face to face patient care time was spent during this visit. All questions were encouraged and answered.   F/U in 1 month

## 2016-01-09 ENCOUNTER — Encounter: Payer: Self-pay | Admitting: Physician Assistant

## 2016-01-09 ENCOUNTER — Ambulatory Visit (INDEPENDENT_AMBULATORY_CARE_PROVIDER_SITE_OTHER): Payer: Medicaid Other | Admitting: Physician Assistant

## 2016-01-09 VITALS — BP 122/84 | HR 88 | Temp 97.9°F | Resp 20

## 2016-01-09 DIAGNOSIS — L309 Dermatitis, unspecified: Secondary | ICD-10-CM

## 2016-01-09 DIAGNOSIS — F329 Major depressive disorder, single episode, unspecified: Secondary | ICD-10-CM | POA: Diagnosis not present

## 2016-01-09 DIAGNOSIS — J439 Emphysema, unspecified: Secondary | ICD-10-CM | POA: Diagnosis not present

## 2016-01-09 DIAGNOSIS — B372 Candidiasis of skin and nail: Secondary | ICD-10-CM

## 2016-01-09 DIAGNOSIS — Z720 Tobacco use: Secondary | ICD-10-CM

## 2016-01-09 DIAGNOSIS — E119 Type 2 diabetes mellitus without complications: Secondary | ICD-10-CM | POA: Diagnosis not present

## 2016-01-09 DIAGNOSIS — M810 Age-related osteoporosis without current pathological fracture: Secondary | ICD-10-CM | POA: Diagnosis not present

## 2016-01-09 DIAGNOSIS — E038 Other specified hypothyroidism: Secondary | ICD-10-CM | POA: Diagnosis not present

## 2016-01-09 DIAGNOSIS — F32A Depression, unspecified: Secondary | ICD-10-CM

## 2016-01-09 DIAGNOSIS — F17200 Nicotine dependence, unspecified, uncomplicated: Secondary | ICD-10-CM

## 2016-01-09 DIAGNOSIS — E785 Hyperlipidemia, unspecified: Secondary | ICD-10-CM | POA: Diagnosis not present

## 2016-01-09 DIAGNOSIS — E559 Vitamin D deficiency, unspecified: Secondary | ICD-10-CM

## 2016-01-09 DIAGNOSIS — Z1159 Encounter for screening for other viral diseases: Secondary | ICD-10-CM

## 2016-01-09 LAB — TSH: TSH: 1.37 m[IU]/L

## 2016-01-09 LAB — COMPLETE METABOLIC PANEL WITH GFR
ALK PHOS: 58 U/L (ref 33–130)
ALT: 14 U/L (ref 6–29)
AST: 15 U/L (ref 10–35)
Albumin: 3.9 g/dL (ref 3.6–5.1)
BUN: 22 mg/dL (ref 7–25)
CHLORIDE: 100 mmol/L (ref 98–110)
CO2: 28 mmol/L (ref 20–31)
Calcium: 9.2 mg/dL (ref 8.6–10.4)
Creat: 1.04 mg/dL — ABNORMAL HIGH (ref 0.50–0.99)
GFR, EST NON AFRICAN AMERICAN: 59 mL/min — AB (ref >=60)
GFR, Est African American: 67 mL/min (ref >=60)
GLUCOSE: 118 mg/dL — AB (ref 70–99)
POTASSIUM: 5 mmol/L (ref 3.5–5.3)
SODIUM: 141 mmol/L (ref 135–146)
Total Bilirubin: 0.3 mg/dL (ref 0.2–1.2)
Total Protein: 6.8 g/dL (ref 6.1–8.1)

## 2016-01-09 LAB — HEMOGLOBIN A1C
HEMOGLOBIN A1C: 6.8 % — AB (ref <5.7)
MEAN PLASMA GLUCOSE: 148 mg/dL

## 2016-01-09 MED ORDER — TRIAMCINOLONE ACETONIDE 0.025 % EX CREA: 1.0000 "application " | TOPICAL_CREAM | Freq: Two times a day (BID) | CUTANEOUS | Status: DC

## 2016-01-09 MED ORDER — NYSTATIN 100000 UNIT/GM EX CREA: 1.0000 "application " | TOPICAL_CREAM | Freq: Two times a day (BID) | CUTANEOUS | Status: DC
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
## 2016-01-10 LAB — HEPATITIS C ANTIBODY: HCV AB: NEGATIVE

## 2016-01-10 LAB — VITAMIN D 25 HYDROXY (VIT D DEFICIENCY, FRACTURES): Vit D, 25-Hydroxy: 41 ng/mL (ref 30–100)

## 2016-01-11 ENCOUNTER — Encounter: Payer: Self-pay | Admitting: Family Medicine

## 2016-01-18 ENCOUNTER — Other Ambulatory Visit: Payer: Self-pay | Admitting: Physician Assistant

## 2016-01-18 ENCOUNTER — Other Ambulatory Visit: Payer: Self-pay | Admitting: Family Medicine

## 2016-01-21 NOTE — Telephone Encounter (Signed)
Refill appropriate and filled per protocol. 

## 2016-01-25 ENCOUNTER — Encounter: Payer: Self-pay | Admitting: Family Medicine

## 2016-01-25 ENCOUNTER — Ambulatory Visit (INDEPENDENT_AMBULATORY_CARE_PROVIDER_SITE_OTHER): Payer: Medicaid Other | Admitting: Family Medicine

## 2016-01-25 VITALS — BP 116/70 | HR 92 | Temp 98.4°F | Resp 20

## 2016-01-25 DIAGNOSIS — R3915 Urgency of urination: Secondary | ICD-10-CM | POA: Diagnosis not present

## 2016-01-25 LAB — URINALYSIS, ROUTINE W REFLEX MICROSCOPIC
Bilirubin Urine: NEGATIVE
GLUCOSE, UA: NEGATIVE
Nitrite: POSITIVE — AB
PH: 5 (ref 5.0–8.0)
Protein, ur: NEGATIVE
Specific Gravity, Urine: 1.03 (ref 1.001–1.035)

## 2016-01-25 LAB — URINALYSIS, MICROSCOPIC ONLY
CASTS: NONE SEEN [LPF]
Crystals: NONE SEEN [HPF]
WBC, UA: >60 WBC/HPF — AB (ref <=5)
YEAST: NONE SEEN [HPF]

## 2016-01-25 MED ORDER — CIPROFLOXACIN HCL 500 MG PO TABS: 500.0000 mg | ORAL_TABLET | Freq: Two times a day (BID) | ORAL | Status: DC

## 2016-01-25 NOTE — Progress Notes (Signed)
Subjective:    Patient ID: Lisa Ewing, female    DOB: [birth year], 60 y.o.   MRN: [id number]  HPI  Worked in today due to symptoms of urinary tract infection.  She reports pressure and urgency for 1 week. She denies any fevers. She does report some dysuria.  She has right-sided CVA tenderness. She's been trying to treat herself at home using Azo, cranberry juice and by pushing fluids however the symptoms are not improving. Her biggest concern is pelvic pressure even after she is done urinating that will not improve Past Medical History  Diagnosis Date  . Barrett esophagus     gives h/o diagnosed elsewhere. Two negative biopsies in 2011 however.  . Chronic pain     from [facility] in 2003  . Back fracture   . Broken hip ([place])     right  . Bursitis of left hip   . Sciatica   . Cervicalgia   . Lumbago   . Disorder of sacroiliac joint   . Pain in joint, upper arm   . Trochanteric bursitis   . 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 Pain in joint, pelvic region and thigh   . Hx of cervical cancer     age 73  . History of uterine cancer 1998    hysterectomy  . Hyperplastic colon polyp   . Vitamin D deficiency   . Shingles   . UTI (lower urinary tract infection)   . Allergic rhinitis   . Asthma   . COPD (chronic obstructive pulmonary disease) ([place])   . Depression   . Diabetes mellitus without complication ([place])   . GERD (gastroesophageal reflux disease)   . Hyperlipidemia   . Osteoporosis   . Hypothyroid 07/11/2013  . Smoker   . TMJ (dislocation of temporomandibular joint)   . Sleep apnea     pt sts i do not use because "the rubber bothers me".  . Sleep apnea   . Short-term memory loss    Past Surgical History  Procedure Laterality Date  . S/p hysterectomy  1999    with BSO  . Thyroidectomy  2007    for large benign tumors  . Rotator cuff repair Right 2006  . Thumb surgery  2010    left-removal of tumor  . Esophagogastroduodenoscopy  05/02/2010    [id number] hiatal  hernia, endoscopically looked like barretts esophagus but NEG biopsy  . Colonoscopy  05/02/2010    [id number] elongated colon. multiple left colon polyps. Next TCS 04/2015  . Abdominal hysterectomy    . Hip surgery Right   . Back surgery  1995    L-5 and S1  . Diagnostic laparoscopy    . Colonoscopy with propofol N/A 02/12/2015    RMR: Multiple colonic polyps ablated/ removed as described above.   . Esophagogastroduodenoscopy (egd) with propofol N/A 02/12/2015    RMR: Abnormal appearing distal esophagus. Query short segment Barretts status post dilation and subsequent biopsy. Small hiatal hernia. Subtly abnormal gastric mucosa of uncertain significance status post biopsy.   . Esophageal dilation N/A 02/12/2015    Procedure: ESOPHAGEAL DILATION;  Surgeon: Daneil Dolin, MD;  Location: [facility];  Service: Endoscopy;  Laterality: N/A;  [place] # [id number]  . Biopsy N/A 02/12/2015    Procedure: BIOPSY;  Surgeon: Daneil Dolin, MD;  Location: [facility];  Service: Endoscopy;  Laterality: N/A;  . Polypectomy N/A 02/12/2015    Procedure: POLYPECTOMY;  Surgeon: Daneil Dolin, MD;  Location: [facility];  Service:  Endoscopy;  Laterality: N/A;  sigmoid polyps  . Carpal bone excision Right 03/01/2015    Procedure: RIGHT TRAPEZIUM EXCISION;  Surgeon: Daryll Brod, MD;  Location: [facility];  Service: Orthopedics;  Laterality: Right;  ANESTHESIA:  CHOICE, REGIONAL BLOCK  . Carpometacarpel suspension plasty Right 03/01/2015    Procedure: RIGHT SUSPENSION PLASTY;  Surgeon: Daryll Brod, MD;  Location: [facility];  Service: Orthopedics;  Laterality: Right;  . Tendon transfer Right 03/01/2015    Procedure: RIGHT ABDUCTUS POLICUS LONGUS TRANSFER (SUSPENSION PLASTY);  Surgeon: Daryll Brod, MD;  Location: [facility];  Service: Orthopedics;  Laterality: Right;   Current Outpatient Prescriptions on File Prior to Visit  Medication Sig Dispense Refill  . ACCU-CHEK AVIVA PLUS test strip USE TO TEST  BLOOD SUGAR ONCE DAILY 50 each 2  . ACCU-CHEK FASTCLIX LANCETS MISC 1 each by Other route daily. 100 each 5  . acyclovir (ZOVIRAX) 400 MG tablet TAKE ONE TABLET BY MOUTH EVERY DAY 30 tablet 2  . ADVAIR DISKUS 250-50 MCG/DOSE AEPB TAKE 1 INHALATION BY MOUTH TWICE DAILY.  RINSE MOUTH AFTER USE. 60 each 4  . atorvastatin (LIPITOR) 40 MG tablet Take 1 tablet (40 mg total) by mouth daily at 6 PM. 30 tablet 5  . calcitRIOL (ROCALTROL) 0.25 MCG capsule TAKE ONE CAPSULE BY MOUTH ONCE DAILY 30 capsule 5  . calcium-vitamin D (OSCAL-500) 500-400 MG-UNIT per tablet Take 1 tablet by mouth 2 (two) times daily. 90 tablet 5  . cetirizine (ZYRTEC) 10 MG tablet TAKE ONE TABLET BY MOUTH DAILY FOR ALLERGIES 30 tablet 11  . Cholecalciferol (VITAMIN D3) 2000 units capsule TAKE 1 CAPSULE BY MOUTH EVERY DAY 30 capsule 2  . diclofenac sodium (VOLTAREN) 1 % GEL Apply 2 g topically 4 (four) times daily. 1 Tube 2  . EPIPEN 2-PAK 0.3 MG/0.3ML SOAJ injection INJECT 1 PEN INTRAMUSCULARLY AS NEEDED FOR ALLERGIC REACTION 2 Device 0  . escitalopram (LEXAPRO) 20 MG tablet TAKE ONE TABLET BY MOUTH DAILY 30 tablet 2  . ezetimibe (ZETIA) 10 MG tablet Take 1 tablet (10 mg total) by mouth daily. 30 tablet 11  . furosemide (LASIX) 20 MG tablet TAKE ONE TABLET BY MOUTH EVERY DAY 30 tablet 3  . gabapentin (NEURONTIN) 300 MG capsule TAKE 1 CAPSULE BY MOUTH THREE TIMES A DAY AND TAKE 2 CAPSULES EVERY NIGHT AT BEDTIME 150 capsule 2  . glucose blood (ACCU-CHEK AVIVA PLUS) test strip 1 each by Other route daily. Use as instructed 50 each 11  . HYDROcodone-acetaminophen (NORCO) 10-325 MG tablet Take 1 tablet by mouth 3 (three) times daily. 90 tablet 0  . hyoscyamine (LEVSIN SL) 0.125 MG SL tablet PLACE 1 TABLET UNDER THE TONGUE EVERY 4 HOURS AS NEEDED 90 tablet 5  . ipratropium-albuterol (DUONEB) 0.5-2.5 (3) MG/3ML SOLN INHALE THE CONTENTS OF ONE VIAL VIA NEBULIZER BY MOUTH FOUR TIMES A DAY AS NEEDED FOR WHEEZING OR SHORTNESS OF BREATH 360 mL 5    . JANUVIA 100 MG tablet TAKE 1 TABLET BY MOUTH EVERY DAY 30 tablet 5  . levothyroxine (SYNTHROID, LEVOTHROID) 137 MCG tablet TAKE ONE TABLET BY MOUTH DAILY BEFORE BREAKFAST 90 tablet 0  . metFORMIN (GLUCOPHAGE) 1000 MG tablet TAKE ONE TABLET BY MOUTH TWICE DAILY.  WITH A MEAL 60 tablet 2  . methocarbamol (ROBAXIN) 500 MG tablet Take 1 tablet (500 mg total) by mouth 3 (three) times daily. 45 tablet 0  . montelukast (SINGULAIR) 10 MG tablet TAKE ONE TABLET BY MOUTH AT BEDTIME  30 tablet 5  . niacin (NIASPAN) 1000 MG CR tablet TAKE 2 TABLETS BY MOUTH AT BEDTIME 60 tablet 5  . nystatin cream (MYCOSTATIN) Apply 1 application topically 2 (two) times daily. 30 g 0  . pantoprazole (PROTONIX) 40 MG tablet Take 1 tablet (40 mg total) by mouth 2 (two) times daily before a meal. 60 tablet 5  . pioglitazone (ACTOS) 45 MG tablet TAKE 1 TABLET BY MOUTH ONCE DAILY 30 tablet 2  . PROVENTIL HFA 108 (90 BASE) MCG/ACT inhaler USE 2 PUFFS EVERY 4 TO 6 HOURS AS NEEDED FOR SHORTNESS OF BREATH 6.7 g 3  . raloxifene (EVISTA) 60 MG tablet TAKE ONE TABLET BY MOUTH DAILY 30 tablet 3  . sucralfate (CARAFATE) 1 G tablet TAKE 1 TABLET BY MOUTH 4 TIMES A DAY WITH MEALS AND AT BEDTIME 120 tablet 2  . traZODone (DESYREL) 100 MG tablet Take 1 tablet (100 mg total) by mouth at bedtime. 30 tablet 2  . triamcinolone (KENALOG) 0.025 % cream Apply 1 application topically 2 (two) times daily. 30 g 0  . TRILIPIX 135 MG capsule TAKE 1 CAPSULE BY MOUTH EVERY DAY 90 capsule 1  . VESICARE 5 MG tablet TAKE 1 TABLET BY MOUTH EVERY DAY 30 tablet 2  . [DISCONTINUED] solifenacin (VESICARE) 5 MG tablet Take 10 mg by mouth daily.     No current facility-administered medications on file prior to visit.   Allergies  Allergen Reactions  . Bee Venom Anaphylaxis  . Latex Shortness Of Breath  . Oxycodone-Acetaminophen Hives and Shortness Of Breath    Upset Stomach  . Penicillins Anaphylaxis    Throat swells   . Shellfish Allergy Anaphylaxis     Throat swells   . Codeine Nausea Only  . Metoclopramide Hcl     Doesn't recall type of reaction  . Other Swelling    Almonds. Bananas cause an asthma attack.   . Pregabalin Swelling  . Sulfonamide Derivatives     Tongue swells   . Adhesive [Tape] Rash  . Wellbutrin [Bupropion] Rash    Hives, red whelps   Social History   Social History  . Marital Status: Divorced    Spouse Name: N/A  . Number of Children: N/A  . Years of Education: N/A   Occupational History  . Not on file.   Social History Main Topics  . Smoking status: Current Every Day Smoker -- 1.00 packs/day for 42 years    Types: Cigarettes  . Smokeless tobacco: Never Used  . Alcohol Use: 0.0 oz/week    0 Standard drinks or equivalent per week     Comment: cocktail once to twice a week to help relax  . Drug Use: No  . Sexual Activity: No   Other Topics Concern  . Not on file   Social History Narrative     Review of Systems  All other systems reviewed and are negative.      Objective:   Physical Exam  Constitutional: She appears well-developed and well-nourished.  Cardiovascular: Normal rate, regular rhythm and normal heart sounds.   Pulmonary/Chest: Effort normal and breath sounds normal.  Abdominal: Soft. Bowel sounds are normal.  Vitals reviewed.  + R CVAT       Assessment & Plan:  Urgency of urination - Plan: Urinalysis, Routine w reflex microscopic (not at [facility])  Given his symptoms and the right-sided CVA tenderness, I will start her on Cipro 500 mg by mouth twice a day for 7 days as she may be  developing early pyelonephritis. Recheck immediately should symptoms worsen or are not completely better after 7 days

## 2016-01-25 NOTE — Addendum Note (Signed)
Addended by: Olena Mater on: 01/25/2016 03:46 PM   Modules accepted: Orders

## 2016-01-28 LAB — URINE CULTURE

## 2016-02-07 ENCOUNTER — Encounter: Payer: Medicaid Other | Attending: Physical Medicine and Rehabilitation

## 2016-02-07 ENCOUNTER — Ambulatory Visit (HOSPITAL_BASED_OUTPATIENT_CLINIC_OR_DEPARTMENT_OTHER): Payer: Medicaid Other | Admitting: Physical Medicine & Rehabilitation

## 2016-02-07 ENCOUNTER — Encounter: Payer: Self-pay | Admitting: Physical Medicine & Rehabilitation

## 2016-02-07 VITALS — BP 141/85 | HR 102 | Resp 14

## 2016-02-07 DIAGNOSIS — E785 Hyperlipidemia, unspecified: Secondary | ICD-10-CM | POA: Insufficient documentation

## 2016-02-07 DIAGNOSIS — K219 Gastro-esophageal reflux disease without esophagitis: Secondary | ICD-10-CM | POA: Insufficient documentation

## 2016-02-07 DIAGNOSIS — J449 Chronic obstructive pulmonary disease, unspecified: Secondary | ICD-10-CM | POA: Insufficient documentation

## 2016-02-07 DIAGNOSIS — M79604 Pain in right leg: Secondary | ICD-10-CM | POA: Diagnosis not present

## 2016-02-07 DIAGNOSIS — M25512 Pain in left shoulder: Secondary | ICD-10-CM | POA: Diagnosis not present

## 2016-02-07 DIAGNOSIS — M47814 Spondylosis without myelopathy or radiculopathy, thoracic region: Secondary | ICD-10-CM | POA: Diagnosis not present

## 2016-02-07 DIAGNOSIS — M7062 Trochanteric bursitis, left hip: Secondary | ICD-10-CM | POA: Diagnosis not present

## 2016-02-07 DIAGNOSIS — M25551 Pain in right hip: Secondary | ICD-10-CM | POA: Diagnosis not present

## 2016-02-07 DIAGNOSIS — E119 Type 2 diabetes mellitus without complications: Secondary | ICD-10-CM | POA: Diagnosis not present

## 2016-02-07 DIAGNOSIS — F17210 Nicotine dependence, cigarettes, uncomplicated: Secondary | ICD-10-CM | POA: Insufficient documentation

## 2016-02-07 DIAGNOSIS — M810 Age-related osteoporosis without current pathological fracture: Secondary | ICD-10-CM | POA: Diagnosis not present

## 2016-02-07 DIAGNOSIS — E039 Hypothyroidism, unspecified: Secondary | ICD-10-CM | POA: Diagnosis not present

## 2016-02-07 DIAGNOSIS — M47816 Spondylosis without myelopathy or radiculopathy, lumbar region: Secondary | ICD-10-CM | POA: Insufficient documentation

## 2016-02-07 DIAGNOSIS — G8929 Other chronic pain: Secondary | ICD-10-CM | POA: Diagnosis present

## 2016-02-07 MED ORDER — HYDROCODONE-ACETAMINOPHEN 10-325 MG PO TABS: 1.0000 | ORAL_TABLET | Freq: Three times a day (TID) | ORAL | Status: DC

## 2016-02-07 NOTE — Patient Instructions (Signed)
Left hip trochanteric Bursa injection performed, [place] and lidocaine  Plan on left shoulder injection next month

## 2016-02-07 NOTE — Progress Notes (Signed)
Left Trochanteric bursa injection With  ultrasound guidance  Indication Trochanteric bursitis. Exam has tenderness over the greater trochanter of the hip. Pain has not responded to conservative care such as exercise therapy and oral medications. Pain interferes with sleep or with mobility Informed consent was obtained after describing risks and benefits of the procedure with the patient these include bleeding bruising and infection. Patient has signed written consent form. Patient placed in a lateral decubitus position with the affected hip superior. Greater trochanter was identified, the area was marked and prepped with Betadine. A 25-gauge 1.5 inch needle was used to anesthetize the skin and subcutaneous tissue with 2 cc 1% lidocaine. Then a 80 mm Echo block needle was in inserted under direct ultrasound visualization. Images saved Needle slightly withdrawn then 6mg  of betamethasone with 4 cc 1% lidocaine were injected. Patient tolerated procedure well. Post procedure instructions given.

## 2016-02-15 ENCOUNTER — Other Ambulatory Visit: Payer: Self-pay | Admitting: Gastroenterology

## 2016-02-15 ENCOUNTER — Other Ambulatory Visit: Payer: Self-pay | Admitting: Physical Medicine & Rehabilitation

## 2016-02-15 ENCOUNTER — Other Ambulatory Visit: Payer: Self-pay | Admitting: Family Medicine

## 2016-02-15 ENCOUNTER — Other Ambulatory Visit: Payer: Self-pay | Admitting: Physician Assistant

## 2016-02-18 NOTE — Telephone Encounter (Signed)
Medication refilled per protocol. 

## 2016-02-25 ENCOUNTER — Telehealth: Payer: Self-pay | Admitting: Family Medicine

## 2016-02-25 ENCOUNTER — Encounter: Payer: Self-pay | Admitting: Family Medicine

## 2016-02-25 NOTE — Telephone Encounter (Signed)
Judson Roch w/ [facility] is requesting a verbal order for pt to be re-certified for the CAP program. [phone]

## 2016-02-26 NOTE — Telephone Encounter (Signed)
Called and spoke to Judson Roch to give verbal order to continue Cap program

## 2016-03-04 ENCOUNTER — Telehealth: Payer: Self-pay | Admitting: Family Medicine

## 2016-03-04 ENCOUNTER — Ambulatory Visit (HOSPITAL_BASED_OUTPATIENT_CLINIC_OR_DEPARTMENT_OTHER): Payer: Medicaid Other | Admitting: Physical Medicine & Rehabilitation

## 2016-03-04 ENCOUNTER — Encounter: Payer: Self-pay | Admitting: Physical Medicine & Rehabilitation

## 2016-03-04 VITALS — BP 136/83 | HR 101 | Resp 14

## 2016-03-04 DIAGNOSIS — M7502 Adhesive capsulitis of left shoulder: Secondary | ICD-10-CM

## 2016-03-04 DIAGNOSIS — Z79899 Other long term (current) drug therapy: Secondary | ICD-10-CM | POA: Diagnosis not present

## 2016-03-04 DIAGNOSIS — G894 Chronic pain syndrome: Secondary | ICD-10-CM

## 2016-03-04 DIAGNOSIS — Z5181 Encounter for therapeutic drug level monitoring: Secondary | ICD-10-CM

## 2016-03-04 DIAGNOSIS — G8929 Other chronic pain: Secondary | ICD-10-CM | POA: Diagnosis not present

## 2016-03-04 MED ORDER — HYDROCODONE-ACETAMINOPHEN 10-325 MG PO TABS: 1.0000 | ORAL_TABLET | Freq: Three times a day (TID) | ORAL | Status: DC

## 2016-03-04 NOTE — Progress Notes (Signed)
The patient returns today complaining of left arm swelling. She has shoulder pain however the swelling is below the shoulder. She's had no trauma to that area. She says she's had this for quite some time may be a year. She is not really sure however. No redness no drainage no open areas. No pain in the hand wrist or elbow.  Patient has pain with external rotation of the shoulder but this is in the shoulder joint. Negative impingement sign. There is increased soft tissue swelling but no pitting edema in the left bicep and tricep area. There is no erythema. Left hand is warm. Good radial pulse  Good grip strength left shoulder 4/5 strength in the left deltoid biceps triceps grip Decreased range of motion shoulder joint itself.  1. 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 Left upper arm soft tissue swelling which is not painful. Suspect lymphedema. Question underlying cause. Have sent a note to primary care physician to further evaluate.  3. Chronic pain syndrome. On chronic narcotic analgesics. Pill counts are appropriate. UDS today Follow-up in 1 month with nurse practitioner  Continue opioid monitoring program. This consists of regular clinic visits, examinations, urine drug screen, pill counts as well as use of [place] controlled substance reporting System.

## 2016-03-04 NOTE — Patient Instructions (Signed)
I don't think the arm swelling is related to your shoulder joint. I also don't think it is the cause of your pain. Ask primary doctor to look into this.It could be a circulation issue or a block in lymphatic fluid

## 2016-03-04 NOTE — Telephone Encounter (Signed)
-----   Message from Charlett Blake, MD sent at 03/04/2016 11:18 AM EDT ----- I saw Mrs. Dugar for a shoulder injection. She has left upper arm swelling which is unrelated to her shoulder joint. I'm concerned she may have some type of lymphatic blockage, less likely would be a DVT in the upper arm given lack of pain in that area. I asked her to follow-up with you on this. Thanks  Charlett Blake M.D. [facility] FAAPM&R (Sports Med, Neuromuscular Med) Diplomate Am Board of Electrodiagnostic Med

## 2016-03-04 NOTE — Progress Notes (Signed)
Shoulder injection Left Glenohumeral With ultrasound guidance  Indication:Left  Shoulder pain not relieved by medication management and other conservative care.  Informed consent was obtained after describing risks and benefits of the procedure with the patient, this includes bleeding, bruising, infection and medication side effects. The patient wishes to proceed and has given written consent. Patient was placed in a seated position. The Left  shoulder was marked and prepped with betadine in the subacromial area. A 25-gauge 1-1/2 inch needle was Used to anesthetize the skin and subcutaneous tissue with 3 cc of 1% lidocaine then an 80 mm Eckel Black needle was inserted under Ultrasoundguidance Into the glenohumeral joint. After negative draw back for blood, a solution containing 1 mL of 6 mg per ML betamethasone and 4 mL of 1% lidocaine was injected. A band aid was applied. The patient tolerated the procedure well. Post procedure instructions were given.

## 2016-03-04 NOTE — Telephone Encounter (Signed)
Please schedule pt with Olean Ree

## 2016-03-04 NOTE — Addendum Note (Signed)
Addended by: Gara Kroner L on: 03/04/2016 02:11 PM   Modules accepted: Orders

## 2016-03-05 NOTE — Telephone Encounter (Signed)
Left pt message to call back for appt.

## 2016-03-06 ENCOUNTER — Encounter: Payer: Self-pay | Admitting: Physician Assistant

## 2016-03-06 ENCOUNTER — Ambulatory Visit (INDEPENDENT_AMBULATORY_CARE_PROVIDER_SITE_OTHER): Payer: Medicaid Other | Admitting: Physician Assistant

## 2016-03-06 VITALS — BP 128/80 | HR 88 | Temp 98.7°F | Resp 16

## 2016-03-06 DIAGNOSIS — M7989 Other specified soft tissue disorders: Secondary | ICD-10-CM | POA: Diagnosis not present

## 2016-03-06 NOTE — Progress Notes (Signed)
Patient ID: Lisa Ewing MRN: [id number], DOB: 05-21-1956, 60 y.o. Date of Encounter: 03/06/2016, 12:38 PM    Chief Complaint:  Chief Complaint  Patient presents with  . OTHER    Arm pain/swelling x 1 year, hand numbness intermitting      HPI: 60 y.o. year old female presents with above.   Says that she went to get steroid injection to the left shoulder at the pain clinic on Tuesday. Says that at that time the doctor there noticed that her left arm was much more swollen and larger than the right arm. He did not feel that it was secondary to her shoulder and recommended that she follow-up here.  I did review that he does have a note in epic regarding this. Concerned that may have lymphatic blockage or less likely DVT.  Patient states that left arm has been this way for a long time --she thinks it has been this way for about a year. There is no pain in the arm itself. There is no erythema and no warmth.  She reports that she has had no mastectomy and no other surgery to that region of her body.     Home Meds:   Outpatient Prescriptions Prior to Visit  Medication Sig Dispense Refill  . ACCU-CHEK AVIVA PLUS test strip USE TO TEST BLOOD SUGAR ONCE DAILY 50 each 2  . ACCU-CHEK FASTCLIX LANCETS MISC 1 each by Other route daily. 100 each 5  . acyclovir (ZOVIRAX) 400 MG tablet TAKE ONE TABLET BY MOUTH EVERY DAY 30 tablet 2  . ADVAIR DISKUS 250-50 MCG/DOSE AEPB TAKE 1 INHALATION BY MOUTH TWICE DAILY.  RINSE MOUTH AFTER USE. 60 each 4  . atorvastatin (LIPITOR) 40 MG tablet Take 1 tablet (40 mg total) by mouth daily at 6 PM. 30 tablet 5  . calcitRIOL (ROCALTROL) 0.25 MCG capsule TAKE ONE CAPSULE BY MOUTH ONCE DAILY 30 capsule 5  . calcium-vitamin D (OSCAL-500) 500-400 MG-UNIT per tablet Take 1 tablet by mouth 2 (two) times daily. 90 tablet 5  . cetirizine (ZYRTEC) 10 MG tablet TAKE ONE TABLET BY MOUTH DAILY FOR ALLERGIES 30 tablet 11  . Cholecalciferol (VITAMIN D3) 2000 units capsule  TAKE 1 CAPSULE BY MOUTH EVERY DAY 30 capsule 2  . EPIPEN 2-PAK 0.3 MG/0.3ML SOAJ injection INJECT 1 PEN INTRAMUSCULARLY AS NEEDED FOR ALLERGIC REACTION 2 Device 0  . escitalopram (LEXAPRO) 20 MG tablet TAKE ONE TABLET BY MOUTH DAILY 30 tablet 2  . ezetimibe (ZETIA) 10 MG tablet Take 1 tablet (10 mg total) by mouth daily. 30 tablet 11  . furosemide (LASIX) 20 MG tablet TAKE ONE TABLET BY MOUTH EVERY DAY 30 tablet 5  . gabapentin (NEURONTIN) 300 MG capsule TAKE 1 CAPSULE BY MOUTH THREE TIMES A DAY AND TAKE 2 CAPSULES EVERY NIGHT AT BEDTIME 150 capsule 2  . glucose blood (ACCU-CHEK AVIVA PLUS) test strip 1 each by Other route daily. Use as instructed 50 each 11  . HYDROcodone-acetaminophen (NORCO) 10-325 MG tablet Take 1 tablet by mouth 3 (three) times daily. 90 tablet 0  . hyoscyamine (LEVSIN SL) 0.125 MG SL tablet PLACE 1 TABLET UNDER THE TONGUE EVERY 4 HOURS AS NEEDED 90 tablet 5  . ipratropium-albuterol (DUONEB) 0.5-2.5 (3) MG/3ML SOLN INHALE THE CONTENTS OF ONE VIAL VIA NEBULIZER BY MOUTH FOUR TIMES A DAY AS NEEDED FOR WHEEZING OR SHORTNESS OF BREATH 360 mL 4  . JANUVIA 100 MG tablet TAKE 1 TABLET BY MOUTH EVERY DAY 30 tablet 5  .  levothyroxine (SYNTHROID, LEVOTHROID) 137 MCG tablet TAKE ONE TABLET BY MOUTH DAILY BEFORE BREAKFAST 90 tablet 0  . metFORMIN (GLUCOPHAGE) 1000 MG tablet TAKE ONE TABLET BY MOUTH TWICE DAILY.  WITH A MEAL 60 tablet 2  . methocarbamol (ROBAXIN) 500 MG tablet Take 1 tablet (500 mg total) by mouth 3 (three) times daily. 45 tablet 0  . montelukast (SINGULAIR) 10 MG tablet TAKE ONE TABLET BY MOUTH AT BEDTIME 30 tablet 5  . niacin (NIASPAN) 1000 MG CR tablet TAKE 2 TABLETS BY MOUTH AT BEDTIME 60 tablet 5  . nystatin cream (MYCOSTATIN) Apply 1 application topically 2 (two) times daily. 30 g 0  . pantoprazole (PROTONIX) 40 MG tablet Take 1 tablet (40 mg total) by mouth 2 (two) times daily before a meal. 60 tablet 5  . pioglitazone (ACTOS) 45 MG tablet TAKE 1 TABLET BY MOUTH  ONCE DAILY 30 tablet 2  . PROVENTIL HFA 108 (90 BASE) MCG/ACT inhaler USE 2 PUFFS EVERY 4 TO 6 HOURS AS NEEDED FOR SHORTNESS OF BREATH 6.7 g 3  . raloxifene (EVISTA) 60 MG tablet TAKE ONE TABLET BY MOUTH DAILY 30 tablet 5  . sucralfate (CARAFATE) 1 G tablet TAKE 1 TABLET BY MOUTH 4 TIMES A DAY WITH MEALS AND AT BEDTIME 120 tablet 2  . traZODone (DESYREL) 100 MG tablet Take 1 tablet (100 mg total) by mouth at bedtime. 30 tablet 2  . triamcinolone (KENALOG) 0.025 % cream Apply 1 application topically 2 (two) times daily. 30 g 0  . TRILIPIX 135 MG capsule TAKE 1 CAPSULE BY MOUTH EVERY DAY 30 capsule 5  . VESICARE 5 MG tablet TAKE 1 TABLET BY MOUTH EVERY DAY 30 tablet 2  . VOLTAREN 1 % GEL APPLY 2 GRAMS TOPICALLY FOUR TIMES A DAY 100 g 1  . ciprofloxacin (CIPRO) 500 MG tablet Take 1 tablet (500 mg total) by mouth 2 (two) times daily. (Patient not taking: Reported on 03/06/2016) 14 tablet 0   No facility-administered medications prior to visit.    Allergies:  Allergies  Allergen Reactions  . Bee Venom Anaphylaxis  . Latex Shortness Of Breath  . Oxycodone-Acetaminophen Hives and Shortness Of Breath    Upset Stomach  . Penicillins Anaphylaxis    Throat swells   . Shellfish Allergy Anaphylaxis    Throat swells   . Codeine Nausea Only  . Metoclopramide Hcl     Doesn't recall type of reaction  . Other Swelling    Almonds. Bananas cause an asthma attack.   . Pregabalin Swelling  . Sulfonamide Derivatives     Tongue swells   . Adhesive [Tape] Rash  . Wellbutrin [Bupropion] Rash    Hives, red whelps      Review of Systems: See HPI for pertinent ROS. All other ROS negative.    Physical Exam: Blood pressure 128/80, pulse 88, temperature 98.7 F (37.1 C), temperature source Oral, resp. rate 16., There is no weight on file to calculate BMI. General: Obese WF. In wheelchair.  Appears in no acute distress. Neck: Supple. No thyromegaly. No lymphadenopathy. Lungs: Clear bilaterally to  auscultation without wheezes, rales, or rhonchi. Breathing is unlabored. Heart: Regular rhythm. No murmurs, rubs, or gallops. Msk:  Strength and tone normal for age. Extremities/Skin:  With pt sitting in wheelchair, with elbow bent, resting on arm rests of wheelchair: Left Upper Arm: There is an extra crease in the left upper extremity near the elbow compared to the right arm.  On the right arm--there is one crease--at elbow.  On the left arm -- there is that crease at the elbow----but there is an additional crease approx 1 inch higher up on arm -- and that upper left arm protrudes slightly more than the right upper arm. There is no erythema, no warmth, no rash.  Neuro: Alert and oriented X 3. Moves all extremities spontaneously. Gait is normal. CNII-XII grossly in tact. Psych:  Responds to questions appropriately with a normal affect.     ASSESSMENT AND PLAN:  60 y.o. year old female with  1. Left upper extremity swelling Will obtain ultrasound of soft tissue of left upper arm and venous doppler of left upper arm also. Will f/u with her once I get these results. - VAS [place] UPPER EXTREMITY VENOUS DUPLEX; Future - [place] Misc Soft Tissue; Future   Signed, Olean Ree [place], [place], [facility] 03/06/2016 12:38 PM

## 2016-03-12 ENCOUNTER — Other Ambulatory Visit: Payer: Self-pay | Admitting: Family Medicine

## 2016-03-12 ENCOUNTER — Other Ambulatory Visit: Payer: Self-pay | Admitting: Physician Assistant

## 2016-03-12 DIAGNOSIS — M7989 Other specified soft tissue disorders: Secondary | ICD-10-CM

## 2016-03-12 NOTE — Telephone Encounter (Signed)
Medication refilled per protocol. 

## 2016-03-13 ENCOUNTER — Telehealth: Payer: Self-pay | Admitting: Family Medicine

## 2016-03-13 NOTE — Telephone Encounter (Signed)
Courtney @ [facility] called requesting that pt's orders be faxed over today so that they do not have to reschedule her appt at 1:30 tomorrow.  Please fax to: [phone]

## 2016-03-13 NOTE — Telephone Encounter (Signed)
All the orders have been E-signed

## 2016-03-14 ENCOUNTER — Other Ambulatory Visit: Payer: Self-pay | Admitting: Gastroenterology

## 2016-03-14 ENCOUNTER — Ambulatory Visit (HOSPITAL_COMMUNITY): Admission: RE | Admit: 2016-03-14 | Payer: Medicaid Other | Source: Ambulatory Visit

## 2016-03-14 ENCOUNTER — Other Ambulatory Visit: Payer: Self-pay | Admitting: Family Medicine

## 2016-03-14 ENCOUNTER — Other Ambulatory Visit: Payer: Self-pay | Admitting: Physician Assistant

## 2016-03-14 LAB — TOXASSURE SELECT,+ANTIDEPR,UR: PDF: 0

## 2016-03-17 NOTE — Telephone Encounter (Signed)
Medication refilled per protocol. 

## 2016-03-18 ENCOUNTER — Ambulatory Visit (HOSPITAL_COMMUNITY)
Admission: RE | Admit: 2016-03-18 | Discharge: 2016-03-18 | Disposition: A | Discharge status: Home / Self Care | Payer: Medicaid Other | Source: Ambulatory Visit | Attending: Family Medicine | Admitting: Family Medicine

## 2016-03-18 DIAGNOSIS — M7989 Other specified soft tissue disorders: Secondary | ICD-10-CM | POA: Diagnosis present

## 2016-03-20 ENCOUNTER — Ambulatory Visit: Payer: Medicaid Other | Admitting: Gastroenterology

## 2016-03-27 NOTE — Progress Notes (Signed)
Urine drug screen for this encounter is consistent for prescribed medication 

## 2016-03-31 ENCOUNTER — Ambulatory Visit: Payer: Medicaid Other | Admitting: Physical Medicine & Rehabilitation

## 2016-03-31 ENCOUNTER — Ambulatory Visit: Payer: Medicaid Other

## 2016-04-03 ENCOUNTER — Ambulatory Visit (HOSPITAL_BASED_OUTPATIENT_CLINIC_OR_DEPARTMENT_OTHER): Payer: Medicaid Other | Admitting: Physical Medicine & Rehabilitation

## 2016-04-03 ENCOUNTER — Encounter: Payer: Self-pay | Admitting: Physical Medicine & Rehabilitation

## 2016-04-03 ENCOUNTER — Encounter: Payer: Medicaid Other | Attending: Physical Medicine and Rehabilitation

## 2016-04-03 VITALS — BP 124/68 | HR 103 | Resp 16

## 2016-04-03 DIAGNOSIS — G8929 Other chronic pain: Secondary | ICD-10-CM | POA: Insufficient documentation

## 2016-04-03 DIAGNOSIS — M961 Postlaminectomy syndrome, not elsewhere classified: Secondary | ICD-10-CM | POA: Diagnosis not present

## 2016-04-03 DIAGNOSIS — M47816 Spondylosis without myelopathy or radiculopathy, lumbar region: Secondary | ICD-10-CM | POA: Insufficient documentation

## 2016-04-03 DIAGNOSIS — M25512 Pain in left shoulder: Secondary | ICD-10-CM | POA: Diagnosis not present

## 2016-04-03 DIAGNOSIS — J449 Chronic obstructive pulmonary disease, unspecified: Secondary | ICD-10-CM | POA: Diagnosis not present

## 2016-04-03 DIAGNOSIS — M47814 Spondylosis without myelopathy or radiculopathy, thoracic region: Secondary | ICD-10-CM | POA: Insufficient documentation

## 2016-04-03 DIAGNOSIS — E785 Hyperlipidemia, unspecified: Secondary | ICD-10-CM | POA: Diagnosis not present

## 2016-04-03 DIAGNOSIS — E039 Hypothyroidism, unspecified: Secondary | ICD-10-CM | POA: Diagnosis not present

## 2016-04-03 DIAGNOSIS — M25551 Pain in right hip: Secondary | ICD-10-CM | POA: Diagnosis not present

## 2016-04-03 DIAGNOSIS — M810 Age-related osteoporosis without current pathological fracture: Secondary | ICD-10-CM | POA: Insufficient documentation

## 2016-04-03 DIAGNOSIS — M7542 Impingement syndrome of left shoulder: Secondary | ICD-10-CM

## 2016-04-03 DIAGNOSIS — M7502 Adhesive capsulitis of left shoulder: Secondary | ICD-10-CM | POA: Diagnosis not present

## 2016-04-03 DIAGNOSIS — E119 Type 2 diabetes mellitus without complications: Secondary | ICD-10-CM | POA: Insufficient documentation

## 2016-04-03 DIAGNOSIS — K219 Gastro-esophageal reflux disease without esophagitis: Secondary | ICD-10-CM | POA: Insufficient documentation

## 2016-04-03 DIAGNOSIS — F17210 Nicotine dependence, cigarettes, uncomplicated: Secondary | ICD-10-CM | POA: Insufficient documentation

## 2016-04-03 DIAGNOSIS — M79604 Pain in right leg: Secondary | ICD-10-CM | POA: Insufficient documentation

## 2016-04-03 DIAGNOSIS — M7062 Trochanteric bursitis, left hip: Secondary | ICD-10-CM

## 2016-04-03 MED ORDER — HYDROCODONE-ACETAMINOPHEN 10-325 MG PO TABS: 1.0000 | ORAL_TABLET | Freq: Three times a day (TID) | ORAL | 0 refills | Status: DC

## 2016-04-03 NOTE — Patient Instructions (Signed)
Lisa Ewing next visit Left hip inj in Sept
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
## 2016-04-11 ENCOUNTER — Other Ambulatory Visit: Payer: Self-pay | Admitting: Family Medicine

## 2016-04-11 ENCOUNTER — Other Ambulatory Visit: Payer: Self-pay | Admitting: Physician Assistant

## 2016-04-14 ENCOUNTER — Ambulatory Visit: Payer: Medicaid Other | Admitting: Gastroenterology

## 2016-04-17 ENCOUNTER — Ambulatory Visit (INDEPENDENT_AMBULATORY_CARE_PROVIDER_SITE_OTHER): Payer: Medicaid Other | Admitting: Physician Assistant

## 2016-04-17 ENCOUNTER — Encounter: Payer: Self-pay | Admitting: Physician Assistant

## 2016-04-17 DIAGNOSIS — E1165 Type 2 diabetes mellitus with hyperglycemia: Secondary | ICD-10-CM | POA: Diagnosis not present
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
## 2016-04-18 LAB — HEMOGLOBIN A1C
Hgb A1c MFr Bld: 6.4 % — ABNORMAL HIGH (ref <5.7)
Mean Plasma Glucose: 137 mg/dL

## 2016-04-24 ENCOUNTER — Encounter: Payer: Self-pay | Admitting: Gastroenterology

## 2016-04-24 ENCOUNTER — Ambulatory Visit (INDEPENDENT_AMBULATORY_CARE_PROVIDER_SITE_OTHER): Payer: Medicaid Other | Admitting: Gastroenterology

## 2016-04-24 VITALS — BP 135/79 | HR 97 | Temp 98.3°F | Ht 65.0 in | Wt 286.8 lb

## 2016-04-24 DIAGNOSIS — K589 Irritable bowel syndrome without diarrhea: Secondary | ICD-10-CM | POA: Diagnosis not present

## 2016-04-24 DIAGNOSIS — K219 Gastro-esophageal reflux disease without esophagitis: Secondary | ICD-10-CM

## 2016-04-24 DIAGNOSIS — R1013 Epigastric pain: Secondary | ICD-10-CM | POA: Diagnosis not present

## 2016-04-24 NOTE — Progress Notes (Signed)
cc'ed to pcp °

## 2016-04-24 NOTE — Progress Notes (Signed)
Primary Care Physician: Karis Juba, PA-C  Primary Gastroenterologist:  Garfield Cornea, MD   Chief Complaint  Patient presents with  . Follow-up    doing ok    HPI: Lisa Ewing is a 60 y.o. female here For six-month follow-up. She has a history of chronic abdominal pain, dyspepsia, solid food dysphagia, chronic IBS-mixed.Colonoscopy and EGD/ED 02/2015. Multiple colon polyps, path with hyperplastic, ?short segment Barrett's but biopsy did not confirm (given diagnosis previously at outside facility), gastritis but no h.pylori. Next TCS in five years given h/o adenomatous polyps previously.   Breakthrough heartburn about 50% of the times. A lot of stress, son has been in [place] and worried about her daughter and upcoming loss of pet. BM every day. No melena, brbpr. Some upper abdominal discomfort all the time. Not Associated with meals or bowel function. Denies dysphagia. She blames all of her symptoms on her stress. Not interested in changing any of her medications. Not interested in any further evaluation this time. These things will settle down over the next few weeks.   Current Outpatient Prescriptions  Medication Sig Dispense Refill  . ACCU-CHEK AVIVA PLUS test strip USE TO TEST BLOOD SUGAR ONCE DAILY 50 each 2  . ACCU-CHEK FASTCLIX LANCETS MISC 1 each by Other route daily. 100 each 5  . acyclovir (ZOVIRAX) 400 MG tablet TAKE 1 TABLET BY MOUTH EVERY DAY 30 tablet 1  . ADVAIR DISKUS 250-50 MCG/DOSE AEPB TAKE 1 INHALATION BY MOUTH TWICE DAILY.  RINSE MOUTH AFTER USE. 60 each 4  . atorvastatin (LIPITOR) 40 MG tablet TAKE 1 TABLET BY MOUTH EVERY DAY AT 6PM 30 tablet 4  . calcitRIOL (ROCALTROL) 0.25 MCG capsule TAKE ONE CAPSULE BY MOUTH ONCE DAILY 30 capsule 5  . calcium-vitamin D (OSCAL-500) 500-400 MG-UNIT per tablet Take 1 tablet by mouth 2 (two) times daily. 90 tablet 5  . cetirizine (ZYRTEC) 10 MG tablet TAKE ONE TABLET BY MOUTH DAILY FOR ALLERGIES 30 tablet 11  .  Cholecalciferol (VITAMIN D3) 2000 units capsule TAKE 1 CAPSULE BY MOUTH EVERY DAY 30 capsule 1  . EPINEPHRINE 0.3 mg/0.3 mL IJ SOAJ injection INJECT 1 PEN INTRAMUSCULARLY AS NEEDED FOR ALLERGIC REACTION 2 Device 1  . escitalopram (LEXAPRO) 20 MG tablet TAKE 1 TABLET BY MOUTH EVERY DAY 30 tablet 1  . ezetimibe (ZETIA) 10 MG tablet Take 1 tablet (10 mg total) by mouth daily. 30 tablet 11  . furosemide (LASIX) 20 MG tablet TAKE ONE TABLET BY MOUTH EVERY DAY 30 tablet 5  . gabapentin (NEURONTIN) 300 MG capsule TAKE 1 CAPSULE BY MOUTH THREE TIMES A DAY AND TAKE 2 CAPSULES EVERY NIGHT AT BEDTIME 150 capsule 2  . glucose blood (ACCU-CHEK AVIVA PLUS) test strip 1 each by Other route daily. Use as instructed 50 each 11  . HYDROcodone-acetaminophen (NORCO) 10-325 MG tablet Take 1 tablet by mouth 3 (three) times daily. 90 tablet 0  . hyoscyamine (LEVSIN SL) 0.125 MG SL tablet PLACE 1 TABLET UNDER THE TONGUE EVERY 4 HOURS AS NEEDED 90 tablet 5  . ipratropium-albuterol (DUONEB) 0.5-2.5 (3) MG/3ML SOLN INHALE THE CONTENTS OF ONE VIAL VIA NEBULIZER BY MOUTH FOUR TIMES A DAY AS NEEDED FOR WHEEZING OR SHORTNESS OF BREATH 360 mL 4  . JANUVIA 100 MG tablet TAKE 1 TABLET BY MOUTH EVERY DAY 30 tablet 5  . levothyroxine (SYNTHROID, LEVOTHROID) 137 MCG tablet TAKE 1 TABLET BY MOUTH DAILY FOR BEFORE BREAKFAST 90 tablet 1  . metFORMIN (GLUCOPHAGE) 1000 MG tablet  TAKE 1 TABLET BY MOUTH TWICE DAILY WITH A MEAL 60 tablet 2  . methocarbamol (ROBAXIN) 500 MG tablet Take 1 tablet (500 mg total) by mouth 3 (three) times daily. 45 tablet 0  . montelukast (SINGULAIR) 10 MG tablet TAKE ONE TABLET BY MOUTH AT BEDTIME 30 tablet 5  . niacin (NIASPAN) 1000 MG CR tablet TAKE 2 TABLETS BY MOUTH AT BEDTIME 60 tablet 5  . nystatin cream (MYCOSTATIN) APPLY 1 APPLICATION TO AFFECTED AREA(S) TWICE DAILY 30 g 5  . pantoprazole (PROTONIX) 40 MG tablet TAKE ONE TABLET BY MOUTH TWO TIMES DAILY BEFORE A MEAL. 60 tablet 4  . pioglitazone (ACTOS) 45  MG tablet TAKE 1 TABLET BY MOUTH EVERY DAY 30 tablet 1  . PROVENTIL HFA 108 (90 BASE) MCG/ACT inhaler USE 2 PUFFS EVERY 4 TO 6 HOURS AS NEEDED FOR SHORTNESS OF BREATH 6.7 g 3  . raloxifene (EVISTA) 60 MG tablet TAKE ONE TABLET BY MOUTH DAILY 30 tablet 5  . sucralfate (CARAFATE) 1 G tablet TAKE 1 TABLET BY MOUTH 4 TIMES A DAY WITH MEALS AND AT BEDTIME 120 tablet 2  . traZODone (DESYREL) 100 MG tablet Take 1 tablet (100 mg total) by mouth at bedtime. 30 tablet 2  . triamcinolone (KENALOG) 0.025 % cream APPLY 1 APPLICATION TOPICALLY TO AFFECTED AREA(S) TWICE DAILY (Patient taking differently: APPLY 1 APPLICATION TOPICALLY TO AFFECTED AREA(S) TWICE DAILY as needed) 30 g 5  . TRILIPIX 135 MG capsule TAKE 1 CAPSULE BY MOUTH EVERY DAY 30 capsule 5  . VESICARE 5 MG tablet TAKE 1 TABLET BY MOUTH EVERY DAY 30 tablet 1  . VOLTAREN 1 % GEL APPLY 2 GRAMS TOPICALLY FOUR TIMES A DAY 100 g 1   No current facility-administered medications for this visit.     Allergies as of 04/24/2016 - Review Complete 04/24/2016  Allergen Reaction Noted  . Bee venom Anaphylaxis 10/10/2011  . Latex Shortness Of Breath 11/27/2009  . Oxycodone-acetaminophen Hives and Shortness Of Breath   . Penicillins Anaphylaxis   . Shellfish allergy Anaphylaxis 10/10/2011  . Codeine Nausea Only 10/10/2011  . Metoclopramide hcl    . Other Swelling 01/05/2012  . Pregabalin Swelling   . Sulfonamide derivatives    . Adhesive [tape] Rash 10/10/2011  . Wellbutrin [bupropion] Rash 07/19/2013    ROS:  General: Negative for anorexia, weight loss, fever, chills, fatigue, weakness. ENT: Negative for hoarseness, difficulty swallowing , nasal congestion. CV: Negative for chest pain, angina, palpitations, dyspnea on exertion, peripheral edema.  Respiratory: Negative for dyspnea at rest, dyspnea on exertion, cough, sputum, wheezing.  GI: See history of present illness. GU:  Negative for dysuria, hematuria, urinary incontinence, urinary  frequency, nocturnal urination.  Endo: Negative for unusual weight change.    Physical Examination:   BP 135/79   Pulse 97   Temp 98.3 F (36.8 C) (Oral)   Ht 5\' 5"  (1.651 m)   Wt 286 lb 12.8 oz (130.1 kg)   BMI 47.73 kg/m   General: Well-nourished, well-developed in no acute distress. In wheelchair Eyes: No icterus. Mouth: Oropharyngeal mucosa moist and pink , no lesions erythema or exudate. Lungs: Clear to auscultation bilaterally.  Heart: Regular rate and rhythm, no murmurs rubs or gallops.  Abdomen: Bowel sounds are normal, nontender, nondistended,difficult exam done in wheelchair due to patient's inability to get on exam table. Extremities: No lower extremity edema. No clubbing or deformities. Neuro: Alert and oriented x 4   Skin: Warm and dry, no jaundice.   Psych: Alert and  cooperative, normal mood and affect.  Labs:  Lab Results  Component Value Date   CREATININE 1.04 (H) 01/09/2016   BUN 22 01/09/2016   NA 141 01/09/2016   K 5.0 01/09/2016   CL 100 01/09/2016   CO2 28 01/09/2016   Lab Results  Component Value Date   ALT 14 01/09/2016   AST 15 01/09/2016   ALKPHOS 58 01/09/2016   BILITOT 0.3 01/09/2016     Imaging Studies: No results found.

## 2016-04-24 NOTE — Assessment & Plan Note (Signed)
Some breakthrough symptoms on pantoprazole twice a day. Patient continues to gain weight. Up nearly 40 pounds in the past one year. Discussed antireflux measures. Discussed weight loss measures. Currently has very poor appetite as far as food and beverage choices and she does not eat on a regular basis. Stress likely in part as well. She will work on these changes. She will call if she has ongoing symptoms. Right now she does not desire to change her regimen.

## 2016-04-24 NOTE — Patient Instructions (Signed)
1. Continue your current medications. 2. Try to eat more frequently, small portions. Low fat diet. Add fruits and vegetables. Try to cut back on your diet soft drinks. 3. Call if you decide to proceed with CT scan of your abdomen. 4. Return to the office in 6 months or sooner if needed

## 2016-04-24 NOTE — Assessment & Plan Note (Addendum)
Chronic abdominal pain/dyspepsia. Worsened with recent stress. Gallbladder workup unremarkable previously. Discussed with patient, if ongoing symptoms would offer her CT abdomen for further evaluation. She will let me know if she decides to pursue. Otherwise we'll see her back in 6 months. Patient did not want to change any of her medication regimens.  IBS well-controlled this time.

## 2016-05-06 ENCOUNTER — Encounter: Payer: Medicaid Other | Attending: Physical Medicine and Rehabilitation | Admitting: Registered Nurse

## 2016-05-06 ENCOUNTER — Encounter: Payer: Self-pay | Admitting: Registered Nurse

## 2016-05-06 VITALS — BP 132/71 | HR 107 | Resp 17

## 2016-05-06 DIAGNOSIS — J449 Chronic obstructive pulmonary disease, unspecified: Secondary | ICD-10-CM | POA: Insufficient documentation

## 2016-05-06 DIAGNOSIS — M7502 Adhesive capsulitis of left shoulder: Secondary | ICD-10-CM

## 2016-05-06 DIAGNOSIS — M47814 Spondylosis without myelopathy or radiculopathy, thoracic region: Secondary | ICD-10-CM | POA: Insufficient documentation

## 2016-05-06 DIAGNOSIS — E119 Type 2 diabetes mellitus without complications: Secondary | ICD-10-CM | POA: Insufficient documentation

## 2016-05-06 DIAGNOSIS — M25551 Pain in right hip: Secondary | ICD-10-CM | POA: Diagnosis not present

## 2016-05-06 DIAGNOSIS — M7062 Trochanteric bursitis, left hip: Secondary | ICD-10-CM

## 2016-05-06 DIAGNOSIS — M25512 Pain in left shoulder: Secondary | ICD-10-CM | POA: Diagnosis not present

## 2016-05-06 DIAGNOSIS — F17210 Nicotine dependence, cigarettes, uncomplicated: Secondary | ICD-10-CM | POA: Diagnosis not present

## 2016-05-06 DIAGNOSIS — M79604 Pain in right leg: Secondary | ICD-10-CM | POA: Insufficient documentation

## 2016-05-06 DIAGNOSIS — E785 Hyperlipidemia, unspecified: Secondary | ICD-10-CM | POA: Insufficient documentation

## 2016-05-06 DIAGNOSIS — E039 Hypothyroidism, unspecified: Secondary | ICD-10-CM | POA: Diagnosis not present

## 2016-05-06 DIAGNOSIS — K219 Gastro-esophageal reflux disease without esophagitis: Secondary | ICD-10-CM | POA: Insufficient documentation

## 2016-05-06 DIAGNOSIS — M546 Pain in thoracic spine: Secondary | ICD-10-CM

## 2016-05-06 DIAGNOSIS — M810 Age-related osteoporosis without current pathological fracture: Secondary | ICD-10-CM | POA: Insufficient documentation

## 2016-05-06 DIAGNOSIS — M5416 Radiculopathy, lumbar region: Secondary | ICD-10-CM

## 2016-05-06 DIAGNOSIS — M47816 Spondylosis without myelopathy or radiculopathy, lumbar region: Secondary | ICD-10-CM | POA: Insufficient documentation

## 2016-05-06 DIAGNOSIS — G8929 Other chronic pain: Secondary | ICD-10-CM | POA: Diagnosis not present

## 2016-05-06 DIAGNOSIS — Z5181 Encounter for therapeutic drug level monitoring: Secondary | ICD-10-CM

## 2016-05-06 DIAGNOSIS — M961 Postlaminectomy syndrome, not elsewhere classified: Secondary | ICD-10-CM

## 2016-05-06 DIAGNOSIS — Z79899 Other long term (current) drug therapy: Secondary | ICD-10-CM

## 2016-05-06 DIAGNOSIS — G894 Chronic pain syndrome: Secondary | ICD-10-CM

## 2016-05-06 MED ORDER — HYDROCODONE-ACETAMINOPHEN 10-325 MG PO TABS: 1.0000 | ORAL_TABLET | Freq: Three times a day (TID) | ORAL | 0 refills | Status: DC
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
## 2016-05-08 ENCOUNTER — Ambulatory Visit (INDEPENDENT_AMBULATORY_CARE_PROVIDER_SITE_OTHER): Payer: Medicaid Other | Admitting: Physician Assistant

## 2016-05-08 ENCOUNTER — Encounter: Payer: Self-pay | Admitting: Physician Assistant

## 2016-05-08 VITALS — BP 138/62 | HR 99 | Temp 98.5°F | Resp 18

## 2016-05-08 DIAGNOSIS — R300 Dysuria: Secondary | ICD-10-CM | POA: Diagnosis not present

## 2016-05-08 DIAGNOSIS — N390 Urinary tract infection, site not specified: Secondary | ICD-10-CM | POA: Diagnosis not present

## 2016-05-08 LAB — URINALYSIS, ROUTINE W REFLEX MICROSCOPIC
Bilirubin Urine: NEGATIVE
Glucose, UA: NEGATIVE
Ketones, ur: NEGATIVE
Nitrite: POSITIVE — AB
PH: 7 (ref 5.0–8.0)
SPECIFIC GRAVITY, URINE: 1.02 (ref 1.001–1.035)

## 2016-05-08 LAB — URINALYSIS, MICROSCOPIC ONLY
Casts: NONE SEEN [LPF]
Crystals: NONE SEEN [HPF]
Yeast: NONE SEEN [HPF]

## 2016-05-08 MED ORDER — CIPROFLOXACIN HCL 500 MG PO TABS: 500.0000 mg | ORAL_TABLET | Freq: Two times a day (BID) | ORAL | 0 refills | Status: DC

## 2016-05-08 NOTE — Progress Notes (Signed)
Patient ID: DALYNN STEUERWALD MRN: [id number], DOB: 25-May-1956, 60 y.o. Date of Encounter: 05/08/2016, 11:50 AM    Chief Complaint:  Chief Complaint  Patient presents with  . Cystitis    painful before and after urination, started 8/24      HPI: 60 y.o. year old female presents with above.   Says that symptoms started last Thursday 7 days ago. Says that she feels some pressure and she feels like she has to urinate again even right after she has just gone. Has been using cranberry juice and AZO but symptoms not resolving. No back pain at costophrenic angle level. No fevers or chills.     Home Meds:   Outpatient Medications Prior to Visit  Medication Sig Dispense Refill  . ACCU-CHEK AVIVA PLUS test strip USE TO TEST BLOOD SUGAR ONCE DAILY 50 each 2  . ACCU-CHEK FASTCLIX LANCETS MISC 1 each by Other route daily. 100 each 5  . acyclovir (ZOVIRAX) 400 MG tablet TAKE 1 TABLET BY MOUTH EVERY DAY 30 tablet 1  . ADVAIR DISKUS 250-50 MCG/DOSE AEPB TAKE 1 INHALATION BY MOUTH TWICE DAILY.  RINSE MOUTH AFTER USE. 60 each 4  . atorvastatin (LIPITOR) 40 MG tablet TAKE 1 TABLET BY MOUTH EVERY DAY AT 6PM 30 tablet 4  . calcitRIOL (ROCALTROL) 0.25 MCG capsule TAKE ONE CAPSULE BY MOUTH ONCE DAILY 30 capsule 5  . calcium-vitamin D (OSCAL-500) 500-400 MG-UNIT per tablet Take 1 tablet by mouth 2 (two) times daily. 90 tablet 5  . cetirizine (ZYRTEC) 10 MG tablet TAKE ONE TABLET BY MOUTH DAILY FOR ALLERGIES 30 tablet 11  . Cholecalciferol (VITAMIN D3) 2000 units capsule TAKE 1 CAPSULE BY MOUTH EVERY DAY 30 capsule 1  . EPINEPHRINE 0.3 mg/0.3 mL IJ SOAJ injection INJECT 1 PEN INTRAMUSCULARLY AS NEEDED FOR ALLERGIC REACTION 2 Device 1  . escitalopram (LEXAPRO) 20 MG tablet TAKE 1 TABLET BY MOUTH EVERY DAY 30 tablet 1  . ezetimibe (ZETIA) 10 MG tablet Take 1 tablet (10 mg total) by mouth daily. 30 tablet 11  . furosemide (LASIX) 20 MG tablet TAKE ONE TABLET BY MOUTH EVERY DAY 30 tablet 5  . gabapentin  (NEURONTIN) 300 MG capsule TAKE 1 CAPSULE BY MOUTH THREE TIMES A DAY AND TAKE 2 CAPSULES EVERY NIGHT AT BEDTIME 150 capsule 2  . glucose blood (ACCU-CHEK AVIVA PLUS) test strip 1 each by Other route daily. Use as instructed 50 each 11  . HYDROcodone-acetaminophen (NORCO) 10-325 MG tablet Take 1 tablet by mouth 3 (three) times daily. 90 tablet 0  . hyoscyamine (LEVSIN SL) 0.125 MG SL tablet PLACE 1 TABLET UNDER THE TONGUE EVERY 4 HOURS AS NEEDED 90 tablet 5  . ipratropium-albuterol (DUONEB) 0.5-2.5 (3) MG/3ML SOLN INHALE THE CONTENTS OF ONE VIAL VIA NEBULIZER BY MOUTH FOUR TIMES A DAY AS NEEDED FOR WHEEZING OR SHORTNESS OF BREATH 360 mL 4  . JANUVIA 100 MG tablet TAKE 1 TABLET BY MOUTH EVERY DAY 30 tablet 5  . levothyroxine (SYNTHROID, LEVOTHROID) 137 MCG tablet TAKE 1 TABLET BY MOUTH DAILY FOR BEFORE BREAKFAST 90 tablet 1  . metFORMIN (GLUCOPHAGE) 1000 MG tablet TAKE 1 TABLET BY MOUTH TWICE DAILY WITH A MEAL 60 tablet 2  . methocarbamol (ROBAXIN) 500 MG tablet Take 1 tablet (500 mg total) by mouth 3 (three) times daily. 45 tablet 0  . montelukast (SINGULAIR) 10 MG tablet TAKE ONE TABLET BY MOUTH AT BEDTIME 30 tablet 5  . niacin (NIASPAN) 1000 MG CR tablet TAKE 2 TABLETS BY  MOUTH AT BEDTIME 60 tablet 5  . nystatin cream (MYCOSTATIN) APPLY 1 APPLICATION TO AFFECTED AREA(S) TWICE DAILY 30 g 5  . pantoprazole (PROTONIX) 40 MG tablet TAKE ONE TABLET BY MOUTH TWO TIMES DAILY BEFORE A MEAL. 60 tablet 4  . pioglitazone (ACTOS) 45 MG tablet TAKE 1 TABLET BY MOUTH EVERY DAY 30 tablet 1  . PROVENTIL HFA 108 (90 BASE) MCG/ACT inhaler USE 2 PUFFS EVERY 4 TO 6 HOURS AS NEEDED FOR SHORTNESS OF BREATH 6.7 g 3  . raloxifene (EVISTA) 60 MG tablet TAKE ONE TABLET BY MOUTH DAILY 30 tablet 5  . sucralfate (CARAFATE) 1 G tablet TAKE 1 TABLET BY MOUTH 4 TIMES A DAY WITH MEALS AND AT BEDTIME 120 tablet 2  . traZODone (DESYREL) 100 MG tablet Take 1 tablet (100 mg total) by mouth at bedtime. 30 tablet 2  . triamcinolone  (KENALOG) 0.025 % cream APPLY 1 APPLICATION TOPICALLY TO AFFECTED AREA(S) TWICE DAILY (Patient taking differently: APPLY 1 APPLICATION TOPICALLY TO AFFECTED AREA(S) TWICE DAILY as needed) 30 g 5  . TRILIPIX 135 MG capsule TAKE 1 CAPSULE BY MOUTH EVERY DAY 30 capsule 5  . VESICARE 5 MG tablet TAKE 1 TABLET BY MOUTH EVERY DAY 30 tablet 1  . VOLTAREN 1 % GEL APPLY 2 GRAMS TOPICALLY FOUR TIMES A DAY 100 g 1   No facility-administered medications prior to visit.     Allergies:  Allergies  Allergen Reactions  . Bee Venom Anaphylaxis  . Latex Shortness Of Breath  . Oxycodone-Acetaminophen Hives and Shortness Of Breath    Upset Stomach  . Penicillins Anaphylaxis    Throat swells   . Shellfish Allergy Anaphylaxis    Throat swells   . Codeine Nausea Only  . Metoclopramide Hcl     Doesn't recall type of reaction  . Other Swelling    Almonds. Bananas cause an asthma attack.   . Pregabalin Swelling  . Sulfonamide Derivatives     Tongue swells   . Adhesive [Tape] Rash  . Wellbutrin [Bupropion] Rash    Hives, red whelps      Review of Systems: See HPI for pertinent ROS. All other ROS negative.    Physical Exam: Blood pressure 138/62, pulse 99, temperature 98.5 F (36.9 C), temperature source Oral, resp. rate 18., There is no height or weight on file to calculate BMI. General:  Obese white female in wheelchair. Appears in no acute distress.  Neck: Supple. No thyromegaly. No lymphadenopathy. Lungs: Clear bilaterally to auscultation without wheezes, rales, or rhonchi. Breathing is unlabored. Heart: Regular rhythm. No murmurs, rubs, or gallops. Abdomen: Soft,  non-distended with normoactive bowel sounds. No hepatomegaly. No rebound/guarding. No obvious abdominal masses. Mild tenderness with palpation of lower mid abdomen. Msk:  Strength and tone normal for age. No tenderness with percussion to costophrenic angles bilaterally. Extremities/Skin: Warm and dry.  Neuro: Alert and oriented X  3. Moves all extremities spontaneously. Gait is normal. CNII-XII grossly in tact. Psych:  Responds to questions appropriately with a normal affect.   Results for orders placed or performed in visit on 05/08/16  Urinalysis, Routine w reflex microscopic (not at [facility])  Result Value Ref Range   Color, Urine YELLOW YELLOW   APPearance CLOUDY (A) CLEAR   Specific Gravity, Urine 1.020 1.001 - 1.035   pH 7.0 5.0 - 8.0   Glucose, UA NEGATIVE NEGATIVE   Bilirubin Urine NEGATIVE NEGATIVE   Ketones, ur NEGATIVE NEGATIVE   Hgb urine dipstick TRACE (A) NEGATIVE   Protein,  ur 1+ (A) NEGATIVE   Nitrite POSITIVE (A) NEGATIVE   Leukocytes, UA 2+ (A) NEGATIVE  Urine Microscopic  Result Value Ref Range   WBC, UA 40-60 (A) <=5 WBC/HPF   RBC / HPF 3-10 (A) <=2 RBC/HPF   Squamous Epithelial / LPF 6-10 (A) <=5 HPF   Bacteria, UA MANY (A) NONE SEEN HPF   Crystals NONE SEEN NONE SEEN HPF   Casts NONE SEEN NONE SEEN LPF   Yeast NONE SEEN NONE SEEN HPF   Urine-Other MUCUS SEEN      ASSESSMENT AND PLAN:  60 y.o. year old female with  1. Urinary tract infection, site not specified She is to start Cipro immediately, take as directed and complete all of it. Follow-up with culture results. Reviewed, again,-- proper hygiene to prevent recurrent UTI. Voices understanding and agrees. - Urine culture - ciprofloxacin (CIPRO) 500 MG tablet; Take 1 tablet (500 mg total) by mouth 2 (two) times daily.  Dispense: 14 tablet; Refill: 0  2. Dysuria - Urinalysis, Routine w reflex microscopic (not at [facility])   Signed, [facility], [place], [facility] 05/08/2016 11:50 AM

## 2016-05-10 LAB — URINE CULTURE

## 2016-05-16 ENCOUNTER — Telehealth: Payer: Self-pay | Admitting: *Deleted

## 2016-05-16 NOTE — Telephone Encounter (Signed)
prior authorization submitted for hydrocodone-acetaminophen 10-325 mg

## 2016-06-05 ENCOUNTER — Ambulatory Visit (HOSPITAL_BASED_OUTPATIENT_CLINIC_OR_DEPARTMENT_OTHER): Payer: Medicaid Other | Admitting: Physical Medicine & Rehabilitation

## 2016-06-05 ENCOUNTER — Encounter: Payer: Self-pay | Admitting: Physical Medicine & Rehabilitation

## 2016-06-05 ENCOUNTER — Encounter: Payer: Medicaid Other | Attending: Physical Medicine and Rehabilitation

## 2016-06-05 DIAGNOSIS — M7062 Trochanteric bursitis, left hip: Secondary | ICD-10-CM

## 2016-06-05 DIAGNOSIS — E119 Type 2 diabetes mellitus without complications: Secondary | ICD-10-CM | POA: Diagnosis not present

## 2016-06-05 DIAGNOSIS — J449 Chronic obstructive pulmonary disease, unspecified: Secondary | ICD-10-CM | POA: Diagnosis not present

## 2016-06-05 DIAGNOSIS — G8929 Other chronic pain: Secondary | ICD-10-CM | POA: Diagnosis present

## 2016-06-05 DIAGNOSIS — M25551 Pain in right hip: Secondary | ICD-10-CM | POA: Diagnosis not present

## 2016-06-05 DIAGNOSIS — E039 Hypothyroidism, unspecified: Secondary | ICD-10-CM | POA: Insufficient documentation

## 2016-06-05 DIAGNOSIS — M47814 Spondylosis without myelopathy or radiculopathy, thoracic region: Secondary | ICD-10-CM | POA: Diagnosis not present

## 2016-06-05 DIAGNOSIS — M47816 Spondylosis without myelopathy or radiculopathy, lumbar region: Secondary | ICD-10-CM | POA: Diagnosis not present

## 2016-06-05 DIAGNOSIS — M79604 Pain in right leg: Secondary | ICD-10-CM | POA: Insufficient documentation

## 2016-06-05 DIAGNOSIS — F17210 Nicotine dependence, cigarettes, uncomplicated: Secondary | ICD-10-CM | POA: Insufficient documentation

## 2016-06-05 DIAGNOSIS — E785 Hyperlipidemia, unspecified: Secondary | ICD-10-CM | POA: Insufficient documentation

## 2016-06-05 DIAGNOSIS — M810 Age-related osteoporosis without current pathological fracture: Secondary | ICD-10-CM | POA: Diagnosis not present

## 2016-06-05 DIAGNOSIS — M25512 Pain in left shoulder: Secondary | ICD-10-CM | POA: Diagnosis not present

## 2016-06-05 DIAGNOSIS — K219 Gastro-esophageal reflux disease without esophagitis: Secondary | ICD-10-CM | POA: Insufficient documentation

## 2016-06-05 MED ORDER — HYDROCODONE-ACETAMINOPHEN 10-325 MG PO TABS: 1.0000 | ORAL_TABLET | Freq: Three times a day (TID) | ORAL | 0 refills | Status: DC

## 2016-06-05 NOTE — Progress Notes (Signed)
Left Trochanteric bursa injection With  ultrasound guidance  Indication Trochanteric bursitis. Exam has tenderness over the greater trochanter of the hip. Pain has not responded to conservative care such as exercise therapy and oral medications. Pain interferes with sleep or with mobility Informed consent was obtained after describing risks and benefits of the procedure with the patient these include bleeding bruising and infection. Patient has signed written consent form. Patient placed in a lateral decubitus position with the affected hip superior. Greater trochanter was identified, the area was marked and prepped with Betadine. A 25-gauge 1.5 inch needle was used to anesthetize the skin and subcutaneous tissue with 2 cc 1% lidocaine. Then a 80 mm Echo block needle was in inserted under direct ultrasound visualization. Images saved Needle slightly withdrawn then 6mg  of betamethasone with 4 cc 1% lidocaine were injected. Patient tolerated procedure well. Post procedure instructions given.

## 2016-06-19 ENCOUNTER — Other Ambulatory Visit: Payer: Self-pay | Admitting: Physician Assistant

## 2016-06-19 ENCOUNTER — Other Ambulatory Visit: Payer: Self-pay | Admitting: Family Medicine

## 2016-06-19 ENCOUNTER — Other Ambulatory Visit: Payer: Self-pay | Admitting: Physical Medicine & Rehabilitation

## 2016-06-20 NOTE — Telephone Encounter (Signed)
RX refilled per protocol 

## 2016-06-23 ENCOUNTER — Telehealth: Payer: Self-pay | Admitting: Physician Assistant

## 2016-06-23 NOTE — Telephone Encounter (Signed)
Spoke with [facility] with [facility] and gave verbal for CAP assessment

## 2016-06-23 NOTE — Telephone Encounter (Signed)
[facility] calling regarding this patient and orders they need to get for her  [phone] Judson Roch

## 2016-07-08 ENCOUNTER — Encounter: Payer: Self-pay | Admitting: Physical Medicine & Rehabilitation

## 2016-07-08 ENCOUNTER — Ambulatory Visit (HOSPITAL_BASED_OUTPATIENT_CLINIC_OR_DEPARTMENT_OTHER): Payer: Medicaid Other | Admitting: Physical Medicine & Rehabilitation

## 2016-07-08 ENCOUNTER — Encounter: Payer: Medicaid Other | Attending: Physical Medicine and Rehabilitation

## 2016-07-08 VITALS — BP 103/64 | HR 100 | Resp 14

## 2016-07-08 DIAGNOSIS — F17210 Nicotine dependence, cigarettes, uncomplicated: Secondary | ICD-10-CM | POA: Diagnosis not present

## 2016-07-08 DIAGNOSIS — M47816 Spondylosis without myelopathy or radiculopathy, lumbar region: Secondary | ICD-10-CM | POA: Diagnosis not present

## 2016-07-08 DIAGNOSIS — M25512 Pain in left shoulder: Secondary | ICD-10-CM | POA: Diagnosis not present

## 2016-07-08 DIAGNOSIS — J449 Chronic obstructive pulmonary disease, unspecified: Secondary | ICD-10-CM | POA: Diagnosis not present

## 2016-07-08 DIAGNOSIS — E039 Hypothyroidism, unspecified: Secondary | ICD-10-CM | POA: Diagnosis not present

## 2016-07-08 DIAGNOSIS — M25551 Pain in right hip: Secondary | ICD-10-CM | POA: Insufficient documentation

## 2016-07-08 DIAGNOSIS — Z5181 Encounter for therapeutic drug level monitoring: Secondary | ICD-10-CM

## 2016-07-08 DIAGNOSIS — M79604 Pain in right leg: Secondary | ICD-10-CM | POA: Diagnosis not present

## 2016-07-08 DIAGNOSIS — M47814 Spondylosis without myelopathy or radiculopathy, thoracic region: Secondary | ICD-10-CM | POA: Diagnosis not present

## 2016-07-08 DIAGNOSIS — K219 Gastro-esophageal reflux disease without esophagitis: Secondary | ICD-10-CM | POA: Insufficient documentation

## 2016-07-08 DIAGNOSIS — E119 Type 2 diabetes mellitus without complications: Secondary | ICD-10-CM | POA: Insufficient documentation

## 2016-07-08 DIAGNOSIS — M7502 Adhesive capsulitis of left shoulder: Secondary | ICD-10-CM | POA: Diagnosis not present

## 2016-07-08 DIAGNOSIS — G8929 Other chronic pain: Secondary | ICD-10-CM | POA: Diagnosis not present

## 2016-07-08 DIAGNOSIS — M25561 Pain in right knee: Principal | ICD-10-CM

## 2016-07-08 DIAGNOSIS — E785 Hyperlipidemia, unspecified: Secondary | ICD-10-CM | POA: Insufficient documentation

## 2016-07-08 DIAGNOSIS — M810 Age-related osteoporosis without current pathological fracture: Secondary | ICD-10-CM | POA: Diagnosis not present

## 2016-07-08 MED ORDER — HYDROCODONE-ACETAMINOPHEN 10-325 MG PO TABS: 1.0000 | ORAL_TABLET | Freq: Three times a day (TID) | ORAL | 0 refills | Status: DC

## 2016-07-08 NOTE — Addendum Note (Signed)
Addended by: Elinor Parkinson I on: 07/08/2016 02:14 PM   Modules accepted: Orders

## 2016-07-08 NOTE — Progress Notes (Signed)
Shoulder injection Left Glenohumeral With ultrasound guidance  Indication:Left  Shoulder pain not relieved by medication management and other conservative care.  Informed consent was obtained after describing risks and benefits of the procedure with the patient, this includes bleeding, bruising, infection and medication side effects. The patient wishes to proceed and has given written consent. Patient was placed in a seated position. The Left  shoulder was marked and prepped with betadine in the subacromial area. A 25-gauge 1-1/2 inch needle was Used to anesthetize the skin and subcutaneous tissue with 3 cc of 1% lidocaine then an 80 mm Echo Black needle was inserted under Ultrasoundguidance Into the glenohumeral joint. After negative draw back for blood, a solution containing 1 mL of 6 mg per ML betamethasone and 4 mL of 1% lidocaine was injected. A band aid was applied. The patient tolerated the procedure well. Post procedure instructions were given.

## 2016-07-18 ENCOUNTER — Other Ambulatory Visit: Payer: Self-pay | Admitting: Family Medicine

## 2016-07-18 ENCOUNTER — Other Ambulatory Visit: Payer: Self-pay | Admitting: Physician Assistant

## 2016-07-18 LAB — TOXASSURE SELECT,+ANTIDEPR,UR

## 2016-07-21 NOTE — Progress Notes (Signed)
Urine drug screen for this encounter is consistent for prescribed medication 

## 2016-07-21 NOTE — Telephone Encounter (Signed)
Rx filled per protocol  

## 2016-07-23 ENCOUNTER — Ambulatory Visit: Payer: Medicaid Other | Admitting: Physician Assistant

## 2016-07-24 ENCOUNTER — Ambulatory Visit (INDEPENDENT_AMBULATORY_CARE_PROVIDER_SITE_OTHER): Payer: Medicaid Other | Admitting: Physician Assistant

## 2016-07-24 ENCOUNTER — Ambulatory Visit (HOSPITAL_COMMUNITY)
Admission: RE | Admit: 2016-07-24 | Discharge: 2016-07-24 | Disposition: A | Discharge status: Home / Self Care | Payer: Medicaid Other | Source: Ambulatory Visit | Attending: Physical Medicine & Rehabilitation | Admitting: Physical Medicine & Rehabilitation

## 2016-07-24 VITALS — BP 118/70 | HR 88 | Temp 98.2°F

## 2016-07-24 DIAGNOSIS — J439 Emphysema, unspecified: Secondary | ICD-10-CM | POA: Diagnosis not present

## 2016-07-24 DIAGNOSIS — M25561 Pain in right knee: Secondary | ICD-10-CM | POA: Insufficient documentation

## 2016-07-24 DIAGNOSIS — G8929 Other chronic pain: Secondary | ICD-10-CM | POA: Insufficient documentation

## 2016-07-24 DIAGNOSIS — E039 Hypothyroidism, unspecified: Secondary | ICD-10-CM | POA: Diagnosis not present

## 2016-07-24 DIAGNOSIS — K219 Gastro-esophageal reflux disease without esophagitis: Secondary | ICD-10-CM

## 2016-07-24 DIAGNOSIS — E119 Type 2 diabetes mellitus without complications: Secondary | ICD-10-CM

## 2016-07-24 DIAGNOSIS — E785 Hyperlipidemia, unspecified: Secondary | ICD-10-CM

## 2016-07-24 DIAGNOSIS — B370 Candidal stomatitis: Secondary | ICD-10-CM

## 2016-07-24 DIAGNOSIS — J020 Streptococcal pharyngitis: Secondary | ICD-10-CM

## 2016-07-24 LAB — COMPLETE METABOLIC PANEL WITH GFR
ALBUMIN: 3.7 g/dL (ref 3.6–5.1)
ALK PHOS: 52 U/L (ref 33–130)
ALT: 13 U/L (ref 6–29)
AST: 13 U/L (ref 10–35)
BUN: 18 mg/dL (ref 7–25)
CALCIUM: 8.9 mg/dL (ref 8.6–10.4)
CHLORIDE: 101 mmol/L (ref 98–110)
CO2: 31 mmol/L (ref 20–31)
CREATININE: 0.95 mg/dL (ref 0.50–0.99)
GFR, EST AFRICAN AMERICAN: 75 mL/min (ref >=60)
GFR, Est Non African American: 65 mL/min (ref >=60)
Glucose, Bld: 104 mg/dL — ABNORMAL HIGH (ref 70–99)
POTASSIUM: 5.3 mmol/L (ref 3.5–5.3)
Sodium: 140 mmol/L (ref 135–146)
Total Bilirubin: 0.2 mg/dL (ref 0.2–1.2)
Total Protein: 6.4 g/dL (ref 6.1–8.1)

## 2016-07-24 LAB — STREP GROUP A AG, W/REFLEX TO CULT: STREGTOCOCCUS GROUP A AG SCREEN: NOT DETECTED

## 2016-07-24 LAB — TSH: TSH: 2.32 mIU/L

## 2016-07-24 MED ORDER — NYSTATIN 100000 UNIT/ML MT SUSP: 5.0000 mL | Freq: Four times a day (QID) | OROMUCOSAL | 0 refills | Status: DC
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
## 2016-07-25 LAB — HEMOGLOBIN A1C
HEMOGLOBIN A1C: 6.3 % — AB (ref <5.7)
MEAN PLASMA GLUCOSE: 134 mg/dL

## 2016-07-25 LAB — MICROALBUMIN, URINE: MICROALB UR: 1.1 mg/dL

## 2016-07-26 LAB — CULTURE, GROUP A STREP: Organism ID, Bacteria: NORMAL

## 2016-07-29 ENCOUNTER — Other Ambulatory Visit: Payer: Self-pay

## 2016-07-30 ENCOUNTER — Telehealth: Payer: Self-pay

## 2016-07-30 MED ORDER — EVISTA 60 MG PO TABS: 60.0000 mg | ORAL_TABLET | Freq: Every day | ORAL | 11 refills | Status: DC

## 2016-07-30 NOTE — Telephone Encounter (Signed)
Prior authorization needed for  Raloxifene Medicaid covers [facility] Rx sent to pharmacy

## 2016-07-30 NOTE — Telephone Encounter (Signed)
error 

## 2016-08-05 ENCOUNTER — Encounter: Payer: Medicaid Other | Attending: Physical Medicine and Rehabilitation

## 2016-08-05 ENCOUNTER — Encounter: Payer: Self-pay | Admitting: Physical Medicine & Rehabilitation

## 2016-08-05 ENCOUNTER — Ambulatory Visit (HOSPITAL_BASED_OUTPATIENT_CLINIC_OR_DEPARTMENT_OTHER): Payer: Medicaid Other | Admitting: Physical Medicine & Rehabilitation

## 2016-08-05 VITALS — BP 147/78 | HR 100

## 2016-08-05 DIAGNOSIS — F17210 Nicotine dependence, cigarettes, uncomplicated: Secondary | ICD-10-CM | POA: Diagnosis not present

## 2016-08-05 DIAGNOSIS — M47814 Spondylosis without myelopathy or radiculopathy, thoracic region: Secondary | ICD-10-CM | POA: Insufficient documentation

## 2016-08-05 DIAGNOSIS — E119 Type 2 diabetes mellitus without complications: Secondary | ICD-10-CM | POA: Insufficient documentation

## 2016-08-05 DIAGNOSIS — E785 Hyperlipidemia, unspecified: Secondary | ICD-10-CM | POA: Insufficient documentation

## 2016-08-05 DIAGNOSIS — M75101 Unspecified rotator cuff tear or rupture of right shoulder, not specified as traumatic: Secondary | ICD-10-CM

## 2016-08-05 DIAGNOSIS — M25512 Pain in left shoulder: Secondary | ICD-10-CM | POA: Insufficient documentation

## 2016-08-05 DIAGNOSIS — M810 Age-related osteoporosis without current pathological fracture: Secondary | ICD-10-CM | POA: Diagnosis not present

## 2016-08-05 DIAGNOSIS — M47816 Spondylosis without myelopathy or radiculopathy, lumbar region: Secondary | ICD-10-CM | POA: Diagnosis not present

## 2016-08-05 DIAGNOSIS — M79604 Pain in right leg: Secondary | ICD-10-CM | POA: Insufficient documentation

## 2016-08-05 DIAGNOSIS — G8929 Other chronic pain: Secondary | ICD-10-CM | POA: Diagnosis not present

## 2016-08-05 DIAGNOSIS — J449 Chronic obstructive pulmonary disease, unspecified: Secondary | ICD-10-CM | POA: Insufficient documentation

## 2016-08-05 DIAGNOSIS — K219 Gastro-esophageal reflux disease without esophagitis: Secondary | ICD-10-CM | POA: Insufficient documentation

## 2016-08-05 DIAGNOSIS — E039 Hypothyroidism, unspecified: Secondary | ICD-10-CM | POA: Diagnosis not present

## 2016-08-05 DIAGNOSIS — M25551 Pain in right hip: Secondary | ICD-10-CM | POA: Insufficient documentation

## 2016-08-05 DIAGNOSIS — IMO0002 Reserved for concepts with insufficient information to code with codable children: Secondary | ICD-10-CM

## 2016-08-05 MED ORDER — HYDROCODONE-ACETAMINOPHEN 10-325 MG PO TABS: 1.0000 | ORAL_TABLET | Freq: Three times a day (TID) | ORAL | 0 refills | Status: DC

## 2016-08-05 NOTE — Progress Notes (Signed)
Shoulder injection Right Glenohumeral with ultrasound guidance)  Indication:Right Shoulder pain not relieved by medication management and other conservative care.  Informed consent was obtained after describing risks and benefits of the procedure with the patient, this includes bleeding, bruising, infection and medication side effects. The patient wishes to proceed and has given written consent. Patient was placed in a seated position. TheRight shoulder was marked and prepped with betadine in the subacromial area. A 25-gauge 1-1/2 inch needle was inserted into the subacromial area. After negative draw back for blood, a solution containing 1 mL of 6 mg per ML betamethasone and 4 mL of 1% lidocaine was injected. A band aid was applied. The patient tolerated the procedure well. Post procedure instructions were given.

## 2016-08-06 ENCOUNTER — Telehealth: Payer: Self-pay

## 2016-08-06 NOTE — Telephone Encounter (Signed)
Pt was seen in the office on 11-16 and states she has a bladder infection that has not cleared up, her urine has a bad odor and she is asking if she can come lve a urine sample and an antibiotic be called in for her  Pls advise

## 2016-08-07 NOTE — Telephone Encounter (Signed)
05/08/2016--this was the last time we did a urinalysis and urine culture. Urine test done in November was done to monitor her diabetes and kidneys related to diabetes. She needs to schedule an office visit and we will do the urine at the visit.

## 2016-08-07 NOTE — Telephone Encounter (Signed)
Called pt she sch appt for 12-4. Explained if her symptoms got worse then go to an ER MBD had no more openings for 12-1.

## 2016-08-11 ENCOUNTER — Ambulatory Visit (INDEPENDENT_AMBULATORY_CARE_PROVIDER_SITE_OTHER): Payer: Medicaid Other | Admitting: Physician Assistant

## 2016-08-11 ENCOUNTER — Encounter: Payer: Self-pay | Admitting: Physician Assistant

## 2016-08-11 VITALS — BP 124/80 | HR 94 | Temp 98.0°F | Resp 18

## 2016-08-11 DIAGNOSIS — N390 Urinary tract infection, site not specified: Secondary | ICD-10-CM | POA: Diagnosis not present

## 2016-08-11 DIAGNOSIS — R8290 Unspecified abnormal findings in urine: Secondary | ICD-10-CM

## 2016-08-11 LAB — URINALYSIS, ROUTINE W REFLEX MICROSCOPIC
Bilirubin Urine: NEGATIVE
GLUCOSE, UA: NEGATIVE
Ketones, ur: NEGATIVE
NITRITE: POSITIVE — AB
Specific Gravity, Urine: >1.03 (ref 1.001–1.035)
pH: 5.5 (ref 5.0–8.0)

## 2016-08-11 LAB — URINALYSIS, MICROSCOPIC ONLY
Casts: NONE SEEN [LPF]
Crystals: NONE SEEN [HPF]
YEAST: NONE SEEN [HPF]

## 2016-08-11 MED ORDER — CIPROFLOXACIN HCL 500 MG PO TABS: 500.0000 mg | ORAL_TABLET | Freq: Two times a day (BID) | ORAL | 0 refills | Status: DC

## 2016-08-11 NOTE — Progress Notes (Signed)
Patient ID: Lisa Ewing MRN: [id number], DOB: 07-04-1956, 60 y.o. Date of Encounter: 08/11/2016, 12:39 PM    Chief Complaint:  Chief Complaint  Patient presents with  . urine odor/painful     HPI: 60 y.o. year old female presetns with above.   Says that she is feeling pressure and discomfort when she urinates. Says she is having urinary frequency and had to go to the bathroom about every hour yesterday. No fevers or chills or back pain at the costophrenic angle region.     Home Meds:   Outpatient Medications Prior to Visit  Medication Sig Dispense Refill  . ACCU-CHEK AVIVA PLUS test strip USE TO TEST BLOOD SUGAR ONCE DAILY 50 each 2  . ACCU-CHEK FASTCLIX LANCETS MISC 1 each by Other route daily. 100 each 5  . acyclovir (ZOVIRAX) 400 MG tablet TAKE 1 TABLET BY MOUTH EVERY DAY 30 tablet 1  . ADVAIR DISKUS 250-50 MCG/DOSE AEPB TAKE 1 INHALATION BY MOUTH TWICE DAILY.  RINSE MOUTH AFTER USE. 60 each 3  . atorvastatin (LIPITOR) 40 MG tablet TAKE 1 TABLET BY MOUTH EVERY DAY AT 6PM 30 tablet 4  . calcitRIOL (ROCALTROL) 0.25 MCG capsule TAKE ONE CAPSULE BY MOUTH ONCE DAILY 30 capsule 5  . calcium-vitamin D (OSCAL-500) 500-400 MG-UNIT per tablet Take 1 tablet by mouth 2 (two) times daily. 90 tablet 5  . cetirizine (ZYRTEC) 10 MG tablet TAKE ONE TABLET BY MOUTH DAILY FOR ALLERGIES 30 tablet 10  . Cholecalciferol (VITAMIN D3) 2000 units capsule TAKE 1 CAPSULE BY MOUTH EVERY DAY 30 capsule 1  . EPINEPHRINE 0.3 mg/0.3 mL IJ SOAJ injection INJECT 1 PEN INTRAMUSCULARLY AS NEEDED FOR ALLERGIC REACTION 2 Device 1  . escitalopram (LEXAPRO) 20 MG tablet TAKE 1 TABLET BY MOUTH EVERY DAY 30 tablet 1  . EVISTA 60 MG tablet Take 1 tablet (60 mg total) by mouth daily. 30 tablet 11  . ezetimibe (ZETIA) 10 MG tablet Take 1 tablet (10 mg total) by mouth daily. 30 tablet 11  . furosemide (LASIX) 20 MG tablet TAKE ONE TABLET BY MOUTH EVERY DAY 30 tablet 5  . gabapentin (NEURONTIN) 300 MG capsule TAKE 1  CAPSULE BY MOUTH THREE TIMES A DAY AND TAKE 2 CAPSULES EVERY NIGHT AT BEDTIME 150 capsule 2  . glucose blood (ACCU-CHEK AVIVA PLUS) test strip 1 each by Other route daily. Use as instructed 50 each 11  . HYDROcodone-acetaminophen (NORCO) 10-325 MG tablet Take 1 tablet by mouth 3 (three) times daily. 90 tablet 0  . hyoscyamine (LEVSIN SL) 0.125 MG SL tablet PLACE 1 TABLET UNDER THE TONGUE EVERY 4 HOURS AS NEEDED 90 tablet 5  . ipratropium-albuterol (DUONEB) 0.5-2.5 (3) MG/3ML SOLN INHALE THE CONTENTS OF ONE VIAL VIA NEBULIZER BY MOUTH FOUR TIMES A DAY AS NEEDED FOR WHEEZING OR SHORTNESS OF BREATH 360 mL 4  . JANUVIA 100 MG tablet TAKE 1 TABLET BY MOUTH EVERY DAY 30 tablet 5  . levothyroxine (SYNTHROID, LEVOTHROID) 137 MCG tablet TAKE 1 TABLET BY MOUTH DAILY FOR BEFORE BREAKFAST 90 tablet 1  . metFORMIN (GLUCOPHAGE) 1000 MG tablet TAKE 1 TABLET BY MOUTH TWICE DAILY WITH A MEAL 60 tablet 2  . methocarbamol (ROBAXIN) 500 MG tablet Take 1 tablet (500 mg total) by mouth 3 (three) times daily. 45 tablet 0  . montelukast (SINGULAIR) 10 MG tablet TAKE ONE TABLET BY MOUTH AT BEDTIME 90 tablet 2  . niacin (NIASPAN) 1000 MG CR tablet TAKE 2 TABLETS BY MOUTH AT BEDTIME 60 tablet  5  . nystatin (MYCOSTATIN) 100000 UNIT/ML suspension Take 5 mLs (500,000 Units total) by mouth 4 (four) times daily. 60 mL 0  . nystatin cream (MYCOSTATIN) APPLY 1 APPLICATION TO AFFECTED AREA(S) TWICE DAILY 30 g 5  . pantoprazole (PROTONIX) 40 MG tablet TAKE ONE TABLET BY MOUTH TWO TIMES DAILY BEFORE A MEAL. 60 tablet 4  . pioglitazone (ACTOS) 45 MG tablet TAKE 1 TABLET BY MOUTH EVERY DAY 30 tablet 1  . PROVENTIL HFA 108 (90 BASE) MCG/ACT inhaler USE 2 PUFFS EVERY 4 TO 6 HOURS AS NEEDED FOR SHORTNESS OF BREATH 6.7 g 3  . sucralfate (CARAFATE) 1 G tablet TAKE 1 TABLET BY MOUTH 4 TIMES A DAY WITH MEALS AND AT BEDTIME 120 tablet 2  . traZODone (DESYREL) 100 MG tablet Take 1 tablet (100 mg total) by mouth at bedtime. 30 tablet 2  .  triamcinolone (KENALOG) 0.025 % cream APPLY 1 APPLICATION TOPICALLY TO AFFECTED AREA(S) TWICE DAILY (Patient taking differently: APPLY 1 APPLICATION TOPICALLY TO AFFECTED AREA(S) TWICE DAILY as needed) 30 g 5  . TRILIPIX 135 MG capsule TAKE 1 CAPSULE BY MOUTH EVERY DAY 30 capsule 5  . VESICARE 5 MG tablet TAKE 1 TABLET BY MOUTH EVERY DAY 30 tablet 1  . VOLTAREN 1 % GEL APPLY 2 GRAMS TOPICALLY FOUR TIMES A DAY 100 g 1   No facility-administered medications prior to visit.     Allergies:  Allergies  Allergen Reactions  . Bee Venom Anaphylaxis  . Latex Shortness Of Breath  . Oxycodone-Acetaminophen Hives and Shortness Of Breath    Upset Stomach  . Penicillins Anaphylaxis    Throat swells   . Shellfish Allergy Anaphylaxis    Throat swells   . Codeine Nausea Only  . Metoclopramide Hcl     Doesn't recall type of reaction  . Other Swelling    Almonds. Bananas cause an asthma attack.   . Pregabalin Swelling  . Sulfonamide Derivatives     Tongue swells   . Adhesive [Tape] Rash  . Wellbutrin [Bupropion] Rash    Hives, red whelps      Review of Systems: See HPI for pertinent ROS. All other ROS negative.    Physical Exam: Blood pressure 124/80, pulse 94, temperature 98 F (36.7 C), temperature source Oral, resp. rate 18, SpO2 93 %., There is no height or weight on file to calculate BMI. General:  Obese WF. In wheelchair. Appears in no acute distress. Neck: Supple. No thyromegaly. No lymphadenopathy. Lungs: Clear bilaterally to auscultation without wheezes, rales, or rhonchi. Breathing is unlabored. Heart: Regular rhythm. No murmurs, rubs, or gallops. Abdomen: Soft,  non-distended with normoactive bowel sounds. No hepatomegaly. No rebound/guarding. No obvious abdominal masses. Mild tenderness with palpation of low mid abdomen.  Msk:  Strength and tone normal for age. No tenderness with percussion of costophrenic angles bilaterally. Extremities/Skin: Warm and dry.  Neuro: Alert and  oriented X 3. Moves all extremities spontaneously. Gait is normal. CNII-XII grossly in tact. Psych:  Responds to questions appropriately with a normal affect.   Results for orders placed or performed in visit on 08/11/16  Urinalysis, Routine w reflex microscopic (not at [facility])  Result Value Ref Range   Color, Urine YELLOW YELLOW   APPearance CLOUDY (A) CLEAR   Specific Gravity, Urine >1.030 1.001 - 1.035   pH 5.5 5.0 - 8.0   Glucose, UA NEGATIVE NEGATIVE   Bilirubin Urine NEGATIVE NEGATIVE   Ketones, ur NEGATIVE NEGATIVE   Hgb urine dipstick 2+ (A) NEGATIVE  Protein, ur 2+ (A) NEGATIVE   Nitrite POSITIVE (A) NEGATIVE   Leukocytes, UA 3+ (A) NEGATIVE  Urine Microscopic  Result Value Ref Range   WBC, UA 40-60 (A) <=5 WBC/HPF   RBC / HPF 10-20 (A) <=2 RBC/HPF   Squamous Epithelial / LPF 0-5 <=5 HPF   Bacteria, UA MODERATE (A) NONE SEEN HPF   Crystals NONE SEEN NONE SEEN HPF   Casts NONE SEEN NONE SEEN LPF   Yeast NONE SEEN NONE SEEN HPF     ASSESSMENT AND PLAN:  60 y.o. year old female with  1. Urinary tract infection without hematuria, site unspecified She is to start antibiotic immediately take as directed and complete all of it. Will send urine culture and will follow up that result and make sure her infection is sensitive to Cipro. - Urine culture - ciprofloxacin (CIPRO) 500 MG tablet; Take 1 tablet (500 mg total) by mouth 2 (two) times daily.  Dispense: 14 tablet; Refill: 0  2. Abnormal urine odor  - Urinalysis, Routine w reflex microscopic (not at [facility])   Signed, [facility] [place], [place], [facility] 08/11/2016 12:39 PM

## 2016-08-11 NOTE — Addendum Note (Signed)
Addended by: Dena Billet on: 08/11/2016 12:48 PM   Modules accepted: Orders

## 2016-08-14 LAB — URINE CULTURE

## 2016-08-15 ENCOUNTER — Telehealth: Payer: Self-pay

## 2016-08-15 ENCOUNTER — Other Ambulatory Visit: Payer: Self-pay | Admitting: Physician Assistant

## 2016-08-15 ENCOUNTER — Other Ambulatory Visit: Payer: Self-pay | Admitting: Family Medicine

## 2016-08-15 NOTE — Telephone Encounter (Signed)
[facility] sent over a fax req hand written Rx for Ultra Poise pads 2 bags of 42  Rx faxed

## 2016-09-09 ENCOUNTER — Encounter: Payer: Medicaid Other | Attending: Physical Medicine and Rehabilitation | Admitting: Registered Nurse

## 2016-09-09 ENCOUNTER — Encounter: Payer: Self-pay | Admitting: Registered Nurse

## 2016-09-09 VITALS — BP 116/82 | HR 102 | Resp 14

## 2016-09-09 DIAGNOSIS — E039 Hypothyroidism, unspecified: Secondary | ICD-10-CM | POA: Insufficient documentation

## 2016-09-09 DIAGNOSIS — M47814 Spondylosis without myelopathy or radiculopathy, thoracic region: Secondary | ICD-10-CM | POA: Insufficient documentation

## 2016-09-09 DIAGNOSIS — E119 Type 2 diabetes mellitus without complications: Secondary | ICD-10-CM | POA: Insufficient documentation

## 2016-09-09 DIAGNOSIS — M810 Age-related osteoporosis without current pathological fracture: Secondary | ICD-10-CM | POA: Diagnosis not present

## 2016-09-09 DIAGNOSIS — F17210 Nicotine dependence, cigarettes, uncomplicated: Secondary | ICD-10-CM | POA: Insufficient documentation

## 2016-09-09 DIAGNOSIS — M961 Postlaminectomy syndrome, not elsewhere classified: Secondary | ICD-10-CM | POA: Diagnosis not present

## 2016-09-09 DIAGNOSIS — G8929 Other chronic pain: Secondary | ICD-10-CM | POA: Diagnosis not present

## 2016-09-09 DIAGNOSIS — J449 Chronic obstructive pulmonary disease, unspecified: Secondary | ICD-10-CM | POA: Insufficient documentation

## 2016-09-09 DIAGNOSIS — M79604 Pain in right leg: Secondary | ICD-10-CM | POA: Insufficient documentation

## 2016-09-09 DIAGNOSIS — M75101 Unspecified rotator cuff tear or rupture of right shoulder, not specified as traumatic: Secondary | ICD-10-CM | POA: Diagnosis not present

## 2016-09-09 DIAGNOSIS — M25512 Pain in left shoulder: Secondary | ICD-10-CM | POA: Diagnosis not present

## 2016-09-09 DIAGNOSIS — M47816 Spondylosis without myelopathy or radiculopathy, lumbar region: Secondary | ICD-10-CM | POA: Diagnosis not present

## 2016-09-09 DIAGNOSIS — M546 Pain in thoracic spine: Secondary | ICD-10-CM

## 2016-09-09 DIAGNOSIS — IMO0002 Reserved for concepts with insufficient information to code with codable children: Secondary | ICD-10-CM

## 2016-09-09 DIAGNOSIS — K219 Gastro-esophageal reflux disease without esophagitis: Secondary | ICD-10-CM | POA: Insufficient documentation

## 2016-09-09 DIAGNOSIS — Z5181 Encounter for therapeutic drug level monitoring: Secondary | ICD-10-CM

## 2016-09-09 DIAGNOSIS — Z79899 Other long term (current) drug therapy: Secondary | ICD-10-CM

## 2016-09-09 DIAGNOSIS — M5416 Radiculopathy, lumbar region: Secondary | ICD-10-CM

## 2016-09-09 DIAGNOSIS — E785 Hyperlipidemia, unspecified: Secondary | ICD-10-CM | POA: Insufficient documentation

## 2016-09-09 DIAGNOSIS — M25551 Pain in right hip: Secondary | ICD-10-CM | POA: Insufficient documentation

## 2016-09-09 MED ORDER — HYDROCODONE-ACETAMINOPHEN 10-325 MG PO TABS: 1.0000 | ORAL_TABLET | Freq: Three times a day (TID) | ORAL | 0 refills | Status: DC
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
## 2016-09-12 ENCOUNTER — Other Ambulatory Visit: Payer: Self-pay | Admitting: Gastroenterology

## 2016-09-12 ENCOUNTER — Other Ambulatory Visit: Payer: Self-pay | Admitting: Physician Assistant

## 2016-09-12 ENCOUNTER — Other Ambulatory Visit: Payer: Self-pay | Admitting: Cardiovascular Disease

## 2016-09-15 NOTE — Telephone Encounter (Signed)
rx filled per protocol  

## 2016-09-15 NOTE — Telephone Encounter (Signed)
Patient has not been seen since 2015 but is prn follow up. Should this be deferred to patients pcp? Please advise. Thanks, MI

## 2016-09-16 ENCOUNTER — Other Ambulatory Visit: Payer: Self-pay | Admitting: Family Medicine

## 2016-09-17 ENCOUNTER — Other Ambulatory Visit: Payer: Self-pay | Admitting: Physical Medicine & Rehabilitation

## 2016-09-17 NOTE — Telephone Encounter (Signed)
Patient also called and left a message that she is feeling very ill. She says it is affecting her muscles and would appreciate a refiill on  her methocarbamol.  This medication was last filled 09/11/2015.Marland KitchenMarland KitchenMarland KitchenMarland Kitchenplease advise

## 2016-09-17 NOTE — Telephone Encounter (Signed)
Did you want to continue with just a 15 day supply of this medication, or should more be prescribed to her, please advise.

## 2016-09-18 ENCOUNTER — Encounter (HOSPITAL_COMMUNITY): Payer: Self-pay | Admitting: Emergency Medicine

## 2016-09-18 ENCOUNTER — Ambulatory Visit (INDEPENDENT_AMBULATORY_CARE_PROVIDER_SITE_OTHER): Payer: Medicaid Other | Admitting: Physician Assistant

## 2016-09-18 ENCOUNTER — Encounter: Payer: Self-pay | Admitting: Physician Assistant

## 2016-09-18 ENCOUNTER — Emergency Department (HOSPITAL_COMMUNITY): Payer: Medicaid Other

## 2016-09-18 ENCOUNTER — Inpatient Hospital Stay (HOSPITAL_COMMUNITY)
Admission: EM | Admit: 2016-09-18 | Discharge: 2016-09-21 | DRG: 191 | Disposition: A | Discharge status: Home / Self Care | Payer: Medicaid Other | Attending: Family Medicine | Admitting: Family Medicine

## 2016-09-18 VITALS — BP 128/80 | HR 107 | Temp 98.0°F | Resp 18

## 2016-09-18 DIAGNOSIS — M542 Cervicalgia: Secondary | ICD-10-CM | POA: Diagnosis present

## 2016-09-18 DIAGNOSIS — E119 Type 2 diabetes mellitus without complications: Secondary | ICD-10-CM | POA: Diagnosis present

## 2016-09-18 DIAGNOSIS — K219 Gastro-esophageal reflux disease without esophagitis: Secondary | ICD-10-CM | POA: Diagnosis present

## 2016-09-18 DIAGNOSIS — Z8542 Personal history of malignant neoplasm of other parts of uterus: Secondary | ICD-10-CM

## 2016-09-18 DIAGNOSIS — E039 Hypothyroidism, unspecified: Secondary | ICD-10-CM | POA: Diagnosis present

## 2016-09-18 DIAGNOSIS — Z833 Family history of diabetes mellitus: Secondary | ICD-10-CM | POA: Diagnosis not present

## 2016-09-18 DIAGNOSIS — Z91030 Bee allergy status: Secondary | ICD-10-CM

## 2016-09-18 DIAGNOSIS — Z882 Allergy status to sulfonamides status: Secondary | ICD-10-CM

## 2016-09-18 DIAGNOSIS — Z888 Allergy status to other drugs, medicaments and biological substances status: Secondary | ICD-10-CM | POA: Diagnosis not present

## 2016-09-18 DIAGNOSIS — Z885 Allergy status to narcotic agent status: Secondary | ICD-10-CM | POA: Diagnosis not present

## 2016-09-18 DIAGNOSIS — R0600 Dyspnea, unspecified: Secondary | ICD-10-CM | POA: Diagnosis not present

## 2016-09-18 DIAGNOSIS — Z86010 Personal history of colonic polyps: Secondary | ICD-10-CM | POA: Diagnosis not present

## 2016-09-18 DIAGNOSIS — M545 Low back pain: Secondary | ICD-10-CM | POA: Diagnosis present

## 2016-09-18 DIAGNOSIS — F17210 Nicotine dependence, cigarettes, uncomplicated: Secondary | ICD-10-CM | POA: Diagnosis present

## 2016-09-18 DIAGNOSIS — B974 Respiratory syncytial virus as the cause of diseases classified elsewhere: Secondary | ICD-10-CM | POA: Diagnosis present

## 2016-09-18 DIAGNOSIS — Z803 Family history of malignant neoplasm of breast: Secondary | ICD-10-CM

## 2016-09-18 DIAGNOSIS — Z6841 Body Mass Index (BMI) 40.0 and over, adult: Secondary | ICD-10-CM | POA: Diagnosis not present

## 2016-09-18 DIAGNOSIS — Z91013 Allergy to seafood: Secondary | ICD-10-CM

## 2016-09-18 DIAGNOSIS — Z7901 Long term (current) use of anticoagulants: Secondary | ICD-10-CM

## 2016-09-18 DIAGNOSIS — J441 Chronic obstructive pulmonary disease with (acute) exacerbation: Secondary | ICD-10-CM | POA: Diagnosis not present

## 2016-09-18 DIAGNOSIS — Z90710 Acquired absence of both cervix and uterus: Secondary | ICD-10-CM | POA: Diagnosis not present

## 2016-09-18 DIAGNOSIS — R000 Tachycardia, unspecified: Secondary | ICD-10-CM | POA: Diagnosis present

## 2016-09-18 DIAGNOSIS — Z880 Allergy status to penicillin: Secondary | ICD-10-CM

## 2016-09-18 DIAGNOSIS — M810 Age-related osteoporosis without current pathological fracture: Secondary | ICD-10-CM | POA: Diagnosis present

## 2016-09-18 DIAGNOSIS — R0602 Shortness of breath: Secondary | ICD-10-CM | POA: Diagnosis not present

## 2016-09-18 DIAGNOSIS — B349 Viral infection, unspecified: Secondary | ICD-10-CM

## 2016-09-18 DIAGNOSIS — R0902 Hypoxemia: Secondary | ICD-10-CM | POA: Diagnosis present

## 2016-09-18 DIAGNOSIS — J449 Chronic obstructive pulmonary disease, unspecified: Secondary | ICD-10-CM | POA: Diagnosis present

## 2016-09-18 DIAGNOSIS — E785 Hyperlipidemia, unspecified: Secondary | ICD-10-CM | POA: Diagnosis present

## 2016-09-18 DIAGNOSIS — G8929 Other chronic pain: Secondary | ICD-10-CM | POA: Diagnosis present

## 2016-09-18 DIAGNOSIS — F329 Major depressive disorder, single episode, unspecified: Secondary | ICD-10-CM | POA: Diagnosis present

## 2016-09-18 DIAGNOSIS — Z808 Family history of malignant neoplasm of other organs or systems: Secondary | ICD-10-CM

## 2016-09-18 DIAGNOSIS — J439 Emphysema, unspecified: Secondary | ICD-10-CM

## 2016-09-18 DIAGNOSIS — Z91040 Latex allergy status: Secondary | ICD-10-CM

## 2016-09-18 DIAGNOSIS — E032 Hypothyroidism due to medicaments and other exogenous substances: Secondary | ICD-10-CM | POA: Diagnosis present

## 2016-09-18 LAB — CBC WITH DIFFERENTIAL/PLATELET
BASOS ABS: 0 10*3/uL (ref 0.0–0.1)
BASOS PCT: 0 %
EOS ABS: 0 10*3/uL (ref 0.0–0.7)
EOS PCT: 0 %
HCT: 40.2 % (ref 36.0–46.0)
Hemoglobin: 12.5 g/dL (ref 12.0–15.0)
LYMPHS PCT: 16 %
Lymphs Abs: 2.1 10*3/uL (ref 0.7–4.0)
MCH: 24.8 pg — ABNORMAL LOW (ref 26.0–34.0)
MCHC: 31.1 g/dL (ref 30.0–36.0)
MCV: 79.6 fL (ref 78.0–100.0)
MONO ABS: 1.2 10*3/uL — AB (ref 0.1–1.0)
Monocytes Relative: 9 %
Neutro Abs: 9.7 10*3/uL — ABNORMAL HIGH (ref 1.7–7.7)
Neutrophils Relative %: 75 %
PLATELETS: 303 10*3/uL (ref 150–400)
RBC: 5.05 MIL/uL (ref 3.87–5.11)
RDW: 19.2 % — AB (ref 11.5–15.5)
WBC: 13 10*3/uL — AB (ref 4.0–10.5)

## 2016-09-18 LAB — I-STAT CG4 LACTIC ACID, ED
LACTIC ACID, VENOUS: 1.65 mmol/L (ref 0.5–1.9)
LACTIC ACID, VENOUS: 2.5 mmol/L — AB (ref 0.5–1.9)

## 2016-09-18 LAB — BRAIN NATRIURETIC PEPTIDE: B NATRIURETIC PEPTIDE 5: 63.7 pg/mL (ref 0.0–100.0)

## 2016-09-18 LAB — COMPREHENSIVE METABOLIC PANEL
ALK PHOS: 59 U/L (ref 38–126)
ALT: 21 U/L (ref 14–54)
AST: 22 U/L (ref 15–41)
Albumin: 3.9 g/dL (ref 3.5–5.0)
Anion gap: 9 (ref 5–15)
BUN: 17 mg/dL (ref 6–20)
CALCIUM: 8.3 mg/dL — AB (ref 8.9–10.3)
CO2: 30 mmol/L (ref 22–32)
CREATININE: 0.88 mg/dL (ref 0.44–1.00)
Chloride: 98 mmol/L — ABNORMAL LOW (ref 101–111)
GFR calc Af Amer: >60 mL/min (ref >60)
Glucose, Bld: 153 mg/dL — ABNORMAL HIGH (ref 65–99)
POTASSIUM: 3.9 mmol/L (ref 3.5–5.1)
Sodium: 137 mmol/L (ref 135–145)
TOTAL PROTEIN: 7.3 g/dL (ref 6.5–8.1)
Total Bilirubin: 0.4 mg/dL (ref 0.3–1.2)

## 2016-09-18 LAB — BLOOD GAS, ARTERIAL
Acid-Base Excess: 4.2 mmol/L — ABNORMAL HIGH (ref 0.0–2.0)
Bicarbonate: 31.3 mmol/L — ABNORMAL HIGH (ref 20.0–28.0)
Drawn by: 257701
O2 CONTENT: 6 L/min
O2 SAT: 97.3 %
PCO2 ART: 61.8 mmHg — AB (ref 32.0–48.0)
PH ART: 7.324 — AB (ref 7.350–7.450)
PO2 ART: 106 mmHg (ref 83.0–108.0)
Patient temperature: 98.6

## 2016-09-18 LAB — I-STAT TROPONIN, ED: Troponin i, poc: 0 ng/mL (ref 0.00–0.08)

## 2016-09-18 LAB — INFLUENZA PANEL BY PCR (TYPE A & B)
INFLAPCR: NEGATIVE
INFLBPCR: NEGATIVE

## 2016-09-18 MED ORDER — ALBUTEROL (5 MG/ML) CONTINUOUS INHALATION SOLN
10.0000 mg/h | INHALATION_SOLUTION | Freq: Once | RESPIRATORY_TRACT | Status: AC
  Administered 2016-09-18: 10 mg/h via RESPIRATORY_TRACT
  Filled 2016-09-18: qty 20

## 2016-09-18 MED ORDER — METHYLPREDNISOLONE SODIUM SUCC 125 MG IJ SOLR
125.0000 mg | Freq: Once | INTRAMUSCULAR | Status: AC
  Administered 2016-09-18: 125 mg via INTRAVENOUS
  Filled 2016-09-18: qty 2

## 2016-09-18 MED ORDER — ATORVASTATIN CALCIUM 40 MG PO TABS
40.0000 mg | ORAL_TABLET | Freq: Every day | ORAL | Status: DC
  Administered 2016-09-18 – 2016-09-20 (×3): 40 mg via ORAL
  Filled 2016-09-18 (×3): qty 1

## 2016-09-18 MED ORDER — HYDROCODONE-ACETAMINOPHEN 10-325 MG PO TABS
1.0000 | ORAL_TABLET | Freq: Three times a day (TID) | ORAL | Status: DC
  Administered 2016-09-18 – 2016-09-21 (×8): 1 via ORAL
  Filled 2016-09-18 (×8): qty 1

## 2016-09-18 MED ORDER — PREDNISONE 20 MG PO TABS
40.0000 mg | ORAL_TABLET | Freq: Every day | ORAL | Status: DC
  Administered 2016-09-19 – 2016-09-21 (×3): 40 mg via ORAL
  Filled 2016-09-18 (×3): qty 2

## 2016-09-18 MED ORDER — IBUPROFEN 200 MG PO TABS
600.0000 mg | ORAL_TABLET | Freq: Once | ORAL | Status: AC
  Administered 2016-09-18: 600 mg via ORAL
  Filled 2016-09-18: qty 3

## 2016-09-18 MED ORDER — INSULIN ASPART 100 UNIT/ML ~~LOC~~ SOLN
0.0000 [IU] | Freq: Three times a day (TID) | SUBCUTANEOUS | Status: DC
  Administered 2016-09-19: 2 [IU] via SUBCUTANEOUS
  Administered 2016-09-19 (×2): 3 [IU] via SUBCUTANEOUS
  Administered 2016-09-20: 5 [IU] via SUBCUTANEOUS
  Administered 2016-09-20: 2 [IU] via SUBCUTANEOUS
  Administered 2016-09-20: 5 [IU] via SUBCUTANEOUS
  Administered 2016-09-21: 3 [IU] via SUBCUTANEOUS

## 2016-09-18 MED ORDER — LEVOTHYROXINE SODIUM 25 MCG PO TABS
137.0000 ug | ORAL_TABLET | Freq: Every day | ORAL | Status: DC
  Administered 2016-09-19 – 2016-09-21 (×3): 137 ug via ORAL
  Filled 2016-09-18 (×3): qty 1

## 2016-09-18 MED ORDER — IPRATROPIUM-ALBUTEROL 0.5-2.5 (3) MG/3ML IN SOLN: 3.0000 mL | Freq: Once | RESPIRATORY_TRACT | Status: DC

## 2016-09-18 MED ORDER — MOMETASONE FURO-FORMOTEROL FUM 200-5 MCG/ACT IN AERO
2.0000 | INHALATION_SPRAY | Freq: Two times a day (BID) | RESPIRATORY_TRACT | Status: DC
  Administered 2016-09-18 – 2016-09-21 (×6): 2 via RESPIRATORY_TRACT
  Filled 2016-09-18: qty 8.8

## 2016-09-18 MED ORDER — GABAPENTIN 300 MG PO CAPS
300.0000 mg | ORAL_CAPSULE | Freq: Every day | ORAL | Status: DC
  Administered 2016-09-19 – 2016-09-21 (×3): 300 mg via ORAL
  Filled 2016-09-18 (×3): qty 1

## 2016-09-18 MED ORDER — MAGNESIUM SULFATE 2 GM/50ML IV SOLN
2.0000 g | Freq: Once | INTRAVENOUS | Status: AC
  Administered 2016-09-18: 2 g via INTRAVENOUS
  Filled 2016-09-18: qty 50

## 2016-09-18 MED ORDER — NICOTINE 14 MG/24HR TD PT24
14.0000 mg | MEDICATED_PATCH | Freq: Every day | TRANSDERMAL | Status: DC
  Administered 2016-09-18 – 2016-09-21 (×4): 14 mg via TRANSDERMAL
  Filled 2016-09-18 (×4): qty 1

## 2016-09-18 MED ORDER — IPRATROPIUM-ALBUTEROL 0.5-2.5 (3) MG/3ML IN SOLN
3.0000 mL | Freq: Once | RESPIRATORY_TRACT | Status: AC
  Administered 2016-09-18: 3 mL via RESPIRATORY_TRACT

## 2016-09-18 MED ORDER — FUROSEMIDE 20 MG PO TABS
20.0000 mg | ORAL_TABLET | Freq: Every day | ORAL | Status: DC
  Administered 2016-09-19 – 2016-09-21 (×3): 20 mg via ORAL
  Filled 2016-09-18 (×3): qty 1

## 2016-09-18 MED ORDER — LEVOFLOXACIN IN D5W 750 MG/150ML IV SOLN
750.0000 mg | INTRAVENOUS | Status: DC
  Administered 2016-09-19: 750 mg via INTRAVENOUS
  Filled 2016-09-18: qty 150

## 2016-09-18 MED ORDER — LEVOFLOXACIN IN D5W 750 MG/150ML IV SOLN
750.0000 mg | Freq: Once | INTRAVENOUS | Status: AC
  Administered 2016-09-18: 750 mg via INTRAVENOUS
  Filled 2016-09-18: qty 150

## 2016-09-18 MED ORDER — ENOXAPARIN SODIUM 40 MG/0.4ML ~~LOC~~ SOLN
40.0000 mg | SUBCUTANEOUS | Status: DC
  Administered 2016-09-18 – 2016-09-20 (×3): 40 mg via SUBCUTANEOUS
  Filled 2016-09-18 (×3): qty 0.4

## 2016-09-18 MED ORDER — RALOXIFENE HCL 60 MG PO TABS
60.0000 mg | ORAL_TABLET | Freq: Every day | ORAL | Status: DC
  Administered 2016-09-18 – 2016-09-21 (×4): 60 mg via ORAL
  Filled 2016-09-18 (×4): qty 1

## 2016-09-18 MED ORDER — SODIUM CHLORIDE 0.9 % IV BOLUS (SEPSIS)
1000.0000 mL | Freq: Once | INTRAVENOUS | Status: AC
  Administered 2016-09-18: 1000 mL via INTRAVENOUS

## 2016-09-18 MED ORDER — TRAZODONE HCL 50 MG PO TABS
100.0000 mg | ORAL_TABLET | Freq: Every day | ORAL | Status: DC
  Administered 2016-09-18 – 2016-09-20 (×3): 100 mg via ORAL
  Filled 2016-09-18 (×3): qty 2

## 2016-09-18 MED ORDER — IPRATROPIUM BROMIDE 0.02 % IN SOLN
1.0000 mg | Freq: Once | RESPIRATORY_TRACT | Status: AC
  Administered 2016-09-18: 1 mg via RESPIRATORY_TRACT
  Filled 2016-09-18: qty 5

## 2016-09-18 MED ORDER — ESCITALOPRAM OXALATE 20 MG PO TABS
20.0000 mg | ORAL_TABLET | Freq: Every day | ORAL | Status: DC
Start: 2016-09-18 — End: 2016-09-21
  Administered 2016-09-18 – 2016-09-21 (×4): 20 mg via ORAL
  Filled 2016-09-18 (×4): qty 1

## 2016-09-18 MED ORDER — IPRATROPIUM-ALBUTEROL 0.5-2.5 (3) MG/3ML IN SOLN
3.0000 mL | Freq: Four times a day (QID) | RESPIRATORY_TRACT | Status: DC
  Administered 2016-09-18 – 2016-09-21 (×10): 3 mL via RESPIRATORY_TRACT
  Filled 2016-09-18 (×11): qty 3

## 2016-09-18 MED ORDER — ACYCLOVIR 400 MG PO TABS
400.0000 mg | ORAL_TABLET | Freq: Every day | ORAL | Status: DC
Start: 2016-09-18 — End: 2016-09-21
  Administered 2016-09-18 – 2016-09-21 (×4): 400 mg via ORAL
  Filled 2016-09-18 (×4): qty 1

## 2016-09-18 MED ORDER — SODIUM CHLORIDE 0.9 % IV SOLN
INTRAVENOUS | Status: AC
  Administered 2016-09-18: 18:00:00 via INTRAVENOUS

## 2016-09-18 MED ORDER — MOMETASONE FURO-FORMOTEROL FUM 200-5 MCG/ACT IN AERO
2.0000 | INHALATION_SPRAY | Freq: Two times a day (BID) | RESPIRATORY_TRACT | Status: DC
  Filled 2016-09-18: qty 8.8

## 2016-09-18 MED ORDER — METHOCARBAMOL 500 MG PO TABS
500.0000 mg | ORAL_TABLET | Freq: Three times a day (TID) | ORAL | Status: DC
Start: 2016-09-18 — End: 2016-09-21
  Administered 2016-09-18 – 2016-09-21 (×8): 500 mg via ORAL
  Filled 2016-09-18 (×8): qty 1

## 2016-09-18 MED ORDER — METHYLPREDNISOLONE ACETATE 80 MG/ML IJ SUSP
80.0000 mg | Freq: Once | INTRAMUSCULAR | Status: AC
  Administered 2016-09-18: 80 mg via INTRAMUSCULAR

## 2016-09-18 MED ORDER — ALBUTEROL SULFATE (2.5 MG/3ML) 0.083% IN NEBU
2.5000 mg | INHALATION_SOLUTION | RESPIRATORY_TRACT | Status: DC | PRN
Start: 2016-09-18 — End: 2016-09-21

## 2016-09-18 MED ORDER — HYOSCYAMINE SULFATE 0.125 MG SL SUBL
0.1250 mg | SUBLINGUAL_TABLET | SUBLINGUAL | Status: DC | PRN
  Filled 2016-09-18: qty 1
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
## 2016-09-18 NOTE — ED Triage Notes (Signed)
Patient seen in in PCP office today. Hypoxic at 82% RA. Reports that she has been "sick" since the 26 of December. Refuses to take Tylenol. Fever.

## 2016-09-18 NOTE — Progress Notes (Signed)
Patient ID: Lisa Ewing MRN: [id number], DOB: [birth year], 61 y.o. Date of Encounter: 09/18/2016, 12:22 PM    Chief Complaint:  Chief Complaint  Patient presents with  . Headache    x 1 week  . Cough  . Chest Pain     HPI: 61 y.o. year old female presents with above.   I was notified by nursing staff that when she was checking and patient, patient's oxygen saturation level was reading 80%. EMS called. Immediately oxygen was added at 3 L nasal cannula. DuoNeb nebulizer treatment started. Depo-Medrol 80 mg IM given.  Her caregiver who comes with her to all of her visits is here with her today. States that Mrs. Provencher was last in her normal state of health around Christmas time. States that last week she "developed a bad cough and was giving her Delsym." States that last night at 3 AM she was called by patient's daughter stating that they were calling EMS and they were wanting the caregiver to come over there to help them. States that they were calling EMS because patient had coughed until she vomited and was feeling presyncope. They state the EMS checked her but it was decided that she could stay home. Patient is currently lying on exam table curled up wearing her night gown. Moaning and groaning. She and the caregiver state that she has had a bad headache especially when she coughs and that her ribs hurt when she coughs. States that she has had low-grade fever 100.7.  No other complaints or concerns at this time.     Home Meds:   Outpatient Medications Prior to Visit  Medication Sig Dispense Refill  . ACCU-CHEK AVIVA PLUS test strip USE TO TEST BLOOD SUGAR ONCE DAILY 50 each 2  . ACCU-CHEK FASTCLIX LANCETS MISC 1 each by Other route daily. 100 each 5  . acyclovir (ZOVIRAX) 400 MG tablet TAKE 1 TABLET BY MOUTH EVERY DAY 30 tablet 1  . ADVAIR DISKUS 250-50 MCG/DOSE AEPB TAKE 1 INHALATION BY MOUTH TWICE DAILY.  RINSE MOUTH AFTER USE. 60 each 3  . atorvastatin (LIPITOR) 40 MG  tablet TAKE 1 TABLET BY MOUTH EVERY DAY AT 6PM 30 tablet 4  . calcitRIOL (ROCALTROL) 0.25 MCG capsule TAKE ONE CAPSULE BY MOUTH ONCE DAILY 30 capsule 5  . calcium-vitamin D (OSCAL-500) 500-400 MG-UNIT per tablet Take 1 tablet by mouth 2 (two) times daily. 90 tablet 5  . cetirizine (ZYRTEC) 10 MG tablet TAKE ONE TABLET BY MOUTH DAILY FOR ALLERGIES 30 tablet 10  . Cholecalciferol (VITAMIN D3) 2000 units capsule TAKE 1 CAPSULE BY MOUTH EVERY DAY 30 capsule 1  . Choline Fenofibrate (FENOFIBRIC ACID) 135 MG CPDR TAKE 1 CAPSULE BY MOUTH EVERY DAY 30 capsule 4  . ciprofloxacin (CIPRO) 500 MG tablet Take 1 tablet (500 mg total) by mouth 2 (two) times daily. 14 tablet 0  . EPINEPHRINE 0.3 mg/0.3 mL IJ SOAJ injection INJECT 1 PEN INTRAMUSCULARLY AS NEEDED FOR ALLERGIC REACTION 2 Device 1  . escitalopram (LEXAPRO) 20 MG tablet TAKE 1 TABLET BY MOUTH EVERY DAY 30 tablet 1  . EVISTA 60 MG tablet Take 1 tablet (60 mg total) by mouth daily. 30 tablet 11  . furosemide (LASIX) 20 MG tablet TAKE ONE TABLET BY MOUTH EVERY DAY 30 tablet 5  . gabapentin (NEURONTIN) 300 MG capsule TAKE 1 CAPSULE BY MOUTH THREE TIMES A DAY AND TAKE 2 CAPSULES EVERY NIGHT AT BEDTIME 150 capsule 2  . glucose blood (ACCU-CHEK AVIVA PLUS)  test strip 1 each by Other route daily. Use as instructed 50 each 11  . HYDROcodone-acetaminophen (NORCO) 10-325 MG tablet Take 1 tablet by mouth 3 (three) times daily. 90 tablet 0  . hyoscyamine (LEVSIN SL) 0.125 MG SL tablet PLACE 1 TABLET UNDER THE TONGUE EVERY 4 HOURS AS NEEDED 90 tablet 5  . ipratropium-albuterol (DUONEB) 0.5-2.5 (3) MG/3ML SOLN INHALE THE CONTENTS OF ONE VIAL VIA NEBULIZER BY MOUTH FOUR TIMES A DAY AS NEEDED FOR WHEEZING OR SHORTNESS OF BREATH 360 mL 4  . JANUVIA 100 MG tablet TAKE 1 TABLET BY MOUTH EVERY DAY 30 tablet 5  . levothyroxine (SYNTHROID, LEVOTHROID) 137 MCG tablet TAKE 1 TABLET BY MOUTH DAILY FOR BEFORE BREAKFAST 90 tablet 1  . metFORMIN (GLUCOPHAGE) 1000 MG tablet TAKE 1  TABLET BY MOUTH TWICE DAILY WITH A MEAL 60 tablet 2  . methocarbamol (ROBAXIN) 500 MG tablet Take 1 tablet (500 mg total) by mouth 3 (three) times daily. 45 tablet 0  . montelukast (SINGULAIR) 10 MG tablet TAKE ONE TABLET BY MOUTH AT BEDTIME 90 tablet 2  . niacin (NIASPAN) 1000 MG CR tablet TAKE 2 TABLETS BY MOUTH AT BEDTIME 60 tablet 5  . nystatin (MYCOSTATIN) 100000 UNIT/ML suspension Take 5 mLs (500,000 Units total) by mouth 4 (four) times daily. 60 mL 0  . nystatin cream (MYCOSTATIN) APPLY 1 APPLICATION TO AFFECTED AREA(S) TWICE DAILY 30 g 5  . pantoprazole (PROTONIX) 40 MG tablet TAKE ONE TABLET BY MOUTH TWO TIMES DAILY BEFORE A MEAL. 60 tablet 4  . pioglitazone (ACTOS) 45 MG tablet TAKE 1 TABLET BY MOUTH EVERY DAY 30 tablet 1  . PROVENTIL HFA 108 (90 BASE) MCG/ACT inhaler USE 2 PUFFS EVERY 4 TO 6 HOURS AS NEEDED FOR SHORTNESS OF BREATH 6.7 g 3  . sucralfate (CARAFATE) 1 G tablet TAKE 1 TABLET BY MOUTH 4 TIMES A DAY WITH MEALS AND AT BEDTIME 120 tablet 2  . traZODone (DESYREL) 100 MG tablet Take 1 tablet (100 mg total) by mouth at bedtime. 30 tablet 2  . triamcinolone (KENALOG) 0.025 % cream APPLY 1 APPLICATION TOPICALLY TO AFFECTED AREA(S) TWICE DAILY (Patient taking differently: APPLY 1 APPLICATION TOPICALLY TO AFFECTED AREA(S) TWICE DAILY as needed) 30 g 5  . VESICARE 5 MG tablet TAKE 1 TABLET BY MOUTH EVERY DAY 30 tablet 1  . VOLTAREN 1 % GEL APPLY 2 GRAMS TOPICALLY FOUR TIMES A DAY 100 g 1  . ZETIA 10 MG tablet TAKE 1 TABLET BY MOUTH DAILY 30 tablet 10   No facility-administered medications prior to visit.     Allergies:  Allergies  Allergen Reactions  . Bee Venom Anaphylaxis  . Latex Shortness Of Breath  . Oxycodone-Acetaminophen Hives and Shortness Of Breath    Upset Stomach  . Penicillins Anaphylaxis    Throat swells   . Shellfish Allergy Anaphylaxis    Throat swells   . Codeine Nausea Only  . Metoclopramide Hcl     Doesn't recall type of reaction  . Other Swelling     Almonds. Bananas cause an asthma attack.   . Pregabalin Swelling  . Sulfonamide Derivatives     Tongue swells   . Adhesive [Tape] Rash  . Wellbutrin [Bupropion] Rash    Hives, red whelps      Review of Systems: See HPI for pertinent ROS. All other ROS negative.    Physical Exam: Blood pressure 128/80, pulse (!) 107, temperature 98 F (36.7 C), temperature source Oral, resp. rate 18, SpO2 (!) 82 %.,  There is no height or weight on file to calculate BMI. General:  WF.  Neck: Supple. No thyromegaly. No lymphadenopathy. Lungs:  She has wheezes throughout bilaterally. Heart: Regular rhythm. No murmurs, rubs, or gallops. Msk:  Strength and tone normal for age. Extremities/Skin: Warm and dry. No clubbing or cyanosis. No edema. No rashes or suspicious lesions. Neuro: Alert and oriented X 3. Moves all extremities spontaneously. Gait is normal. CNII-XII grossly in tact. Psych:  Responds to questions appropriately with a normal affect.     ASSESSMENT AND PLAN:  61 y.o. year old female with  1. Hypoxia EMS has arrived and will be transporting her directly to the emergency room. Here she has been treated with 3 L nasal cannula oxygen. She has been given DuoNeb nebulizer treatment. Depo-Medrol 80 mg 1. Suspect that she may have influenza but the influenza test has not been performed here and will need to be performed once at the hospital along with any other evaluation necessary. She has left here in stable condition with EMS.  2. Dyspnea, unspecified type    Signed, [address] [place], [place], [facility] 09/18/2016 12:22 PM
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
## 2016-09-19 LAB — GLUCOSE, CAPILLARY
GLUCOSE-CAPILLARY: 162 mg/dL — AB (ref 65–99)
Glucose-Capillary: 137 mg/dL — ABNORMAL HIGH (ref 65–99)
Glucose-Capillary: 190 mg/dL — ABNORMAL HIGH (ref 65–99)
Glucose-Capillary: 213 mg/dL — ABNORMAL HIGH (ref 65–99)

## 2016-09-19 LAB — RESPIRATORY PANEL BY PCR
ADENOVIRUS-RVPPCR: NOT DETECTED
Bordetella pertussis: NOT DETECTED
CHLAMYDOPHILA PNEUMONIAE-RVPPCR: NOT DETECTED
CORONAVIRUS HKU1-RVPPCR: NOT DETECTED
CORONAVIRUS NL63-RVPPCR: NOT DETECTED
CORONAVIRUS OC43-RVPPCR: NOT DETECTED
Coronavirus 229E: NOT DETECTED
INFLUENZA A-RVPPCR: NOT DETECTED
Influenza B: NOT DETECTED
Metapneumovirus: NOT DETECTED
Mycoplasma pneumoniae: NOT DETECTED
PARAINFLUENZA VIRUS 1-RVPPCR: NOT DETECTED
PARAINFLUENZA VIRUS 2-RVPPCR: NOT DETECTED
PARAINFLUENZA VIRUS 3-RVPPCR: NOT DETECTED
PARAINFLUENZA VIRUS 4-RVPPCR: NOT DETECTED
RHINOVIRUS / ENTEROVIRUS - RVPPCR: NOT DETECTED
Respiratory Syncytial Virus: DETECTED — AB

## 2016-09-19 LAB — BASIC METABOLIC PANEL
ANION GAP: 10 (ref 5–15)
BUN: 18 mg/dL (ref 6–20)
CHLORIDE: 99 mmol/L — AB (ref 101–111)
CO2: 31 mmol/L (ref 22–32)
Calcium: 8.3 mg/dL — ABNORMAL LOW (ref 8.9–10.3)
Creatinine, Ser: 0.99 mg/dL (ref 0.44–1.00)
GFR calc Af Amer: >60 mL/min (ref >60)
GFR calc non Af Amer: >60 mL/min (ref >60)
Glucose, Bld: 181 mg/dL — ABNORMAL HIGH (ref 65–99)
POTASSIUM: 4.4 mmol/L (ref 3.5–5.1)
SODIUM: 140 mmol/L (ref 135–145)

## 2016-09-19 MED ORDER — PANTOPRAZOLE SODIUM 40 MG PO TBEC
40.0000 mg | DELAYED_RELEASE_TABLET | Freq: Two times a day (BID) | ORAL | Status: DC
  Administered 2016-09-19 – 2016-09-21 (×5): 40 mg via ORAL
  Filled 2016-09-19 (×5): qty 1

## 2016-09-19 MED ORDER — LINAGLIPTIN 5 MG PO TABS
5.0000 mg | ORAL_TABLET | Freq: Every day | ORAL | Status: DC
  Administered 2016-09-19 – 2016-09-21 (×3): 5 mg via ORAL
  Filled 2016-09-19 (×3): qty 1

## 2016-09-19 MED ORDER — PREMIER PROTEIN SHAKE
11.0000 [oz_av] | ORAL | Status: DC
  Administered 2016-09-19 – 2016-09-20 (×2): 11 [oz_av] via ORAL
  Filled 2016-09-19 (×2): qty 325.31

## 2016-09-19 NOTE — Progress Notes (Signed)
OT Cancellation Note  Patient Details Name: Lisa Ewing MRN: [id number] DOB: July 20, 1956   Cancelled Treatment:    Reason Eval/Treat Not Completed: Other (comment). Pt is sleeping soundly.  She did well with PT and may not need OT. Will check back later today or tomorrow.  Cung Masterson 09/19/2016, 2:24 PM  Lesle Chris, OTR/L [phone] 09/19/2016

## 2016-09-19 NOTE — Evaluation (Signed)
Occupational Therapy Evaluation Patient Details Name: Lisa Ewing MRN: [id number] DOB: [birth year] Today's Date: 09/19/2016    History of Present Illness  61 y.o. female with medical history significant of COPD, morbid obesity, chronic pain and admitted for COPD exacerbation   Clinical Impression   Pt was admitted for the above.  Pt is at/near baseline and has close to 24/7 at home. She does not need any further OT in acute setting    Follow Up Recommendations  Supervision - Intermittent    Equipment Recommendations  None recommended by OT    Recommendations for Other Services       Precautions / Restrictions Precautions Precaution Comments: monitor sats Restrictions Weight Bearing Restrictions: No      Mobility Bed Mobility Overal bed mobility: Modified Independent for OOB; min A for back to bed, for RLE                Transfers Overall transfer level: Needs assistance   Transfers: Sit to/from Stand;Stand Pivot Transfers Sit to Stand: Supervision Stand pivot transfers: Supervision       General transfer comment: pt feels she is at baseline    Balance                                            ADL Overall ADL's : At baseline                                       General ADL Comments: pt has assist for adls. Performed Spt to 3:1  commode with supervision.  Pt pivots on LLE to/from 3:1.  She has assist for adls and has a leg lifter at home so she can manage bed mobiity on her own. Dyspnea 2/4 with activity; pt initiates rest breaks     Vision     Perception     Praxis      Pertinent Vitals/Pain Pain Assessment: Faces Pain Score:  Faces Pain Scale: No hurt Pain Location:  Pain Descriptors / Indicators:  Pain Intervention(s): Limited activity within patient's tolerance;Monitored during session      Hand Dominance     Extremity/Trunk Assessment Upper Extremity Assessment Upper Extremity Assessment:  Overall WFL for tasks assessed          Communication Communication Communication: No difficulties   Cognition Arousal/Alertness: Awake/alert Behavior During Therapy: WFL for tasks assessed/performed Overall Cognitive Status: Within Functional Limits for tasks assessed                     General Comments       Exercises       Shoulder Instructions      Home Living Family/patient expects to be discharged to:: Private residence Living Arrangements: Children Available Help at Discharge: Personal care attendant;Family Type of Home: Apartment Home Access: Level entry     Home Layout: One level         Bathroom Toilet: Handicapped height     Home Equipment: Wheelchair - power          Prior Functioning/Environment Level of Independence: Needs assistance        Comments: PCA helps with showers.  Pt independent with transfers to toilet, power chair.  PCA there 9-4; daughter home after this  OT Problem List:     OT Treatment/Interventions:      OT Goals(Current goals can be found in the care plan section) Acute Rehab OT Goals Patient Stated Goal: none stated:  pt needed to use toilet when I arrived OT Goal Formulation: All assessment and education complete, DC therapy  OT Frequency:     Barriers to D/C:            Co-evaluation              End of Session    Activity Tolerance: Patient tolerated treatment well Patient left: in bed;with call bell/phone within reach   Time: [id number] OT Time Calculation (min): 8 min Charges:  OT General Charges $OT Visit: 1 Procedure OT Evaluation $OT Eval Low Complexity: 1 Procedure G-Codes:    Anallely Rosell 2016/09/22, 3:33 PM  Lesle Chris, OTR/L [phone] 22-Sep-2016

## 2016-09-19 NOTE — Progress Notes (Signed)
PROGRESS NOTE    [name]  [id number] DOB: 07-03-1956 DOA: 09/18/2016 PCP: Karis Juba, PA-C    Brief Narrative:  Lisa Ewing is a 61 y.o. female with medical history significant of COPD, morbid obesity, chronic pain presented to PCP office today with concerns for headache, cough and chest pain for the past week. Her caregiver who comes with her to all of her visits went to PCP with her today.  Per that note " Mrs. Detlefsen was last in her normal state of health around Christmas time. States that last week she 'developed a bad cough and was giving her Delsym.' States that last night at 3 AM she was called by patient's daughter stating that they were calling EMS and they were wanting the caregiver to come over there to help them. States that they were calling EMS because patient had coughed until she vomited and was feeling presyncope. They state the EMS checked her but it was decided that she could stay home."  Patient oxygen saturation was 80% while at PCP office and she was placed on 3L [place] and sent to [facility]. Patient reports that her son was sick with a viral syndrome when he returned home for the holidays from college.  Patient reports she has not been feeling well since the 30th of December.  Patient says she has been hospitalized previously 2/2 respiratory problems.  Baseline is she does not wear oxygen.  Cough is productive of yellowish green sputum.  No chest pain, no chest pressure.  Does not feel particularly short of breath.  Does have a nebulizer machine at home.  1 episode of emesis secondary to coughing this am.  No other episodes of emesis, no abdominal pain.     Assessment & Plan:   Principal Problem:   COPD (chronic obstructive pulmonary disease) (HCC) Active Problems:   GERD   Chronic low back pain   Hypothyroid   COPD exacerbation (HCC)   Tachycardia   COPD exacerbation - continue levaquin and azithromycin - Duonebs QID - Prednisone 40mg  daily - albuterol  PRN - influenza negative - respiratory panel positive for RSV - blood cultures pending - flutter valve  Chronic low back pain - continue Norco 10-325mg  TID - continue gabapentin  Shingles "prophylaxis" - continue acyclovir daily  Tobacco Abuse - will order nicotine replacement  Depression - continue Lexapro  Hypothyroidism - continue with synthroid at home dose  DM Type 2 - rsetart tradjenta - SSI and CBG's - monitor CBG's with steroid use  Debility - will have patient seen by PT/OT     DVT prophylaxis: Lovenox Code Status:  Full Code Family Communication:  No family bedside Disposition Plan: Likely discharge back to previous home environment in the next 24-48 hours   Consultants:   PT  Procedures:   None  Antimicrobials:   Levaquin  Azithromycin    Subjective: Patient is sleeping at time of exam.  States she feels much better upon awakening.  Still requiring 3L [place] oxygen.  Says she slept well last night for the first time in 4 days.  She had not yet eaten at time of rounds.    Objective: Vitals:   09/19/16 0624 09/19/16 0747 09/19/16 0950 09/19/16 1128  BP: (!) 143/67     Pulse: (!) 105     Resp: 20     Temp: 98.4 F (36.9 C)     TempSrc: Oral     SpO2: 96% 96% 94% 93%  Weight: 129.1 kg (  284 lb 9.8 oz)     Height:        Intake/Output Summary (Last 24 hours) at 09/19/16 1354 Last data filed at 09/19/16 [id number]  Gross per 24 hour  Intake             1620 ml  Output                0 ml  Net             1620 ml   Filed Weights   09/18/16 2040 09/19/16 0624  Weight: 129.1 kg (284 lb 9.8 oz) 129.1 kg (284 lb 9.8 oz)    Examination:  General exam: Appears calm and comfortable  Respiratory system: respiratory effort normal, expiratory wheezing heard throughout but improved from time of admission Cardiovascular system: S1 & S2 heard, RRR. No JVD, murmurs, rubs, gallops or clicks. No pedal edema. Gastrointestinal system: Abdomen  is obese, nondistended, soft and nontender. No organomegaly or masses felt. Normal bowel sounds heard. Central nervous system: Alert and oriented. No focal neurological deficits. Extremities: Symmetric 5 x 5 power. Skin: No rashes, lesions or ulcers Psychiatry: Judgement and insight appear normal. Mood & affect appropriate.     Data Reviewed: I have personally reviewed following labs and imaging studies  CBC:  Recent Labs Lab 09/18/16 1447  WBC 13.0*  NEUTROABS 9.7*  HGB 12.5  HCT 40.2  MCV 79.6  PLT [id number]   Basic Metabolic Panel:  Recent Labs Lab 09/18/16 1447 09/19/16 0501  NA 137 140  K 3.9 4.4  CL 98* 99*  CO2 30 31  GLUCOSE 153* 181*  BUN 17 18  CREATININE 0.88 0.99  CALCIUM 8.3* 8.3*   GFR: Estimated Creatinine Clearance: 81.9 mL/min (by C-G formula based on SCr of 0.99 mg/dL). Liver Function Tests:  Recent Labs Lab 09/18/16 1447  AST 22  ALT 21  ALKPHOS 59  BILITOT 0.4  PROT 7.3  ALBUMIN 3.9   No results for input(s): LIPASE, AMYLASE in the last 168 hours. No results for input(s): AMMONIA in the last 168 hours. Coagulation Profile: No results for input(s): INR, PROTIME in the last 168 hours. Cardiac Enzymes: No results for input(s): CKTOTAL, CKMB, CKMBINDEX, TROPONINI in the last 168 hours. BNP (last 3 results) No results for input(s): PROBNP in the last 8760 hours. HbA1C: No results for input(s): HGBA1C in the last 72 hours. CBG:  Recent Labs Lab 09/19/16 0810 09/19/16 1155  GLUCAP 137* 162*   Lipid Profile: No results for input(s): CHOL, HDL, LDLCALC, TRIG, CHOLHDL, LDLDIRECT in the last 72 hours. Thyroid Function Tests: No results for input(s): TSH, T4TOTAL, FREET4, T3FREE, THYROIDAB in the last 72 hours. Anemia Panel: No results for input(s): VITAMINB12, FOLATE, FERRITIN, TIBC, IRON, RETICCTPCT in the last 72 hours. Sepsis Labs:  Recent Labs Lab 09/18/16 1457 09/18/16 1804  LATICACIDVEN 1.65 2.50*    Recent Results (from the  past 240 hour(s))  Respiratory Panel by PCR     Status: Abnormal   Collection Time: 09/18/16  8:17 PM  Result Value Ref Range Status   Adenovirus NOT DETECTED NOT DETECTED Final   Coronavirus 229E NOT DETECTED NOT DETECTED Final   Coronavirus HKU1 NOT DETECTED NOT DETECTED Final   Coronavirus NL63 NOT DETECTED NOT DETECTED Final   Coronavirus OC43 NOT DETECTED NOT DETECTED Final   Metapneumovirus NOT DETECTED NOT DETECTED Final   Rhinovirus / Enterovirus NOT DETECTED NOT DETECTED Final   Influenza A NOT DETECTED NOT DETECTED Final  Influenza B NOT DETECTED NOT DETECTED Final   Parainfluenza Virus 1 NOT DETECTED NOT DETECTED Final   Parainfluenza Virus 2 NOT DETECTED NOT DETECTED Final   Parainfluenza Virus 3 NOT DETECTED NOT DETECTED Final   Parainfluenza Virus 4 NOT DETECTED NOT DETECTED Final   Respiratory Syncytial Virus DETECTED (A) NOT DETECTED Final    Comment: CRITICAL RESULT CALLED TO, READ BACK BY AND VERIFIED WITH: K PHILLIPS,RN AT 1203 09/19/16 BY L BENFIELD    Bordetella pertussis NOT DETECTED NOT DETECTED Final   Chlamydophila pneumoniae NOT DETECTED NOT DETECTED Final   Mycoplasma pneumoniae NOT DETECTED NOT DETECTED Final    Comment: Performed at [facility]         Radiology Studies: Dg Chest 2 View  Result Date: 09/18/2016 CLINICAL DATA:  Shortness of breath, chest tightness, long time smoking history EXAM: CHEST  2 VIEW COMPARISON:  Chest x-ray of 10/12/2013 FINDINGS: No active infiltrate or effusion is seen. Mediastinal and hilar contours are unremarkable. Moderate cardiomegaly is stable. No acute bony abnormality is seen. IMPRESSION: Stable moderate cardiomegaly.  No active lung disease. Electronically Signed   By: Ivar Drape M.D.   On: 09/18/2016 15:21        Scheduled Meds: . acyclovir  400 mg Oral Daily  . atorvastatin  40 mg Oral q1800  . enoxaparin (LOVENOX) injection  40 mg Subcutaneous Q24H  . escitalopram  20 mg Oral Daily  .  furosemide  20 mg Oral Daily  . gabapentin  300 mg Oral Q0600  . HYDROcodone-acetaminophen  1 tablet Oral TID  . insulin aspart  0-15 Units Subcutaneous TID WC  . ipratropium-albuterol  3 mL Nebulization QID  . levofloxacin (LEVAQUIN) IV  750 mg Intravenous Q24H  . levothyroxine  137 mcg Oral QAC breakfast  . linagliptin  5 mg Oral Daily  . methocarbamol  500 mg Oral TID  . mometasone-formoterol  2 puff Inhalation BID  . nicotine  14 mg Transdermal Daily  . pantoprazole  40 mg Oral BID  . predniSONE  40 mg Oral Q breakfast  . raloxifene  60 mg Oral Daily  . traZODone  100 mg Oral QHS   Continuous Infusions:   LOS: 1 day    Time spent: 30 minutes    Loretha Stapler, MD [facility] Pager [phone]  If 7PM-7AM, please contact night-coverage [email or web address] Password [email or web address] 09/19/2016, 1:54 PM

## 2016-09-19 NOTE — Progress Notes (Signed)
Initial Nutrition Assessment  DOCUMENTATION CODES:   Morbid obesity  INTERVENTION:  - Will order Premier Protein once/day to aid in meeting estimated protein needs, this supplement provides 160 kcal and 30 grams of protein.  - RD will follow-up 1/14.  NUTRITION DIAGNOSIS:   Increased nutrient needs (for protein) related to chronic illness (COPD) as evidenced by estimated needs (for protein).  GOAL:   Patient will meet greater than or equal to 90% of their needs  MONITOR:   PO intake, Supplement acceptance, Weight trends, Labs, I & O's  REASON FOR ASSESSMENT:   Consult COPD Protocol  ASSESSMENT:   61 y.o. female with medical history significant of COPD, morbid obesity, chronic pain presented to PCP office today with concerns for headache, cough and chest pain for the past week. Patient oxygen saturation was 80% while at PCP office and she was placed on 3L [place] and sent to [facility]. Patient reports that her son was sick with a viral syndrome when he returned home for the holidays from college.  Patient reports she has not been feeling well since December 30th.  At baseline is she does not wear oxygen.  Cough is productive of yellowish green sputum.  Does not feel particularly short of breath.  Does have a nebulizer machine at home.  1 episode of emesis secondary to coughing this am.  No other episodes of emesis, no abdominal pain.    Pt seen per protocol. BMI indicates morbid obesity. No intakes documented since admission. Pt discussed in rounds this AM and MD states pt is unaware of severity of PMH. Pt sleeping soundly at this time; OT exited room and stated that pt was unarousable to loud name call several times. Will have RD follow-up on Sunday. Pt likely would benefit from diet education in light of PMH and morbid obesity.   Unable to perform physical assessment; no noted wasting of muscle or fat to upper body from visualization of pt. Per chart review, weight stable over the past 1  year.  Medications reviewed; 20 mg oral Lasix/day, sliding scale Novolog, 137 mcg oral Synthroid/day, 2 g IV Mg sulfate x1 dose yesterday, 125 mg IV Solu-medrol x1 dose yesterday, 40 mg oral Protonix BID, 40 mg Deltasone/day. Labs reviewed; CBGs: 137 and 162 mg/dL today, Cl: 99 mmol/L, Ca: 8.3 mg/dL.   Diet Order:  Diet heart healthy/carb modified Room service appropriate? Yes; Fluid consistency: Thin  Skin:  Reviewed, no issues  Last BM:  1/10 (PTA)  Height:   Ht Readings from Last 1 Encounters:  09/18/16 5\' 5"  (1.651 m)    Weight:   Wt Readings from Last 1 Encounters:  09/19/16 284 lb 9.8 oz (129.1 kg)    Ideal Body Weight:  56.82 kg  BMI:  Body mass index is 47.36 kg/m.  Estimated Nutritional Needs:   Kcal:  1675-1940 (13-15 kcal/kg)  Protein:  85-97 grams (1.5-1.7 grams/kg IBW)  Fluid:  1.8-2 L/day  EDUCATION NEEDS:   No education needs identified at this time    Jarome Matin, MS, RD, LDN, CNSC Inpatient Clinical Dietitian Pager # [phone] After hours/weekend pager # [phone]

## 2016-09-19 NOTE — Evaluation (Signed)
Physical Therapy One Time Evaluation Patient Details Name: NEETU BOKA MRN: [id number] DOB: Mar 25, 1956 Today's Date: 09/19/2016   History of Present Illness   61 y.o. female with medical history significant of COPD, morbid obesity, chronic pain and admitted for COPD exacerbation  Clinical Impression  Patient evaluated by Physical Therapy with no further acute PT needs identified. All education has been completed and the patient has no further questions.  Pt reports only performing transfers at baseline (for 10 years).  SpO2 dropped to 87% on room air at rest so reapplied 2L O2 [place] for transfer and SPO2 93% post-transfer. See below for any follow-up Physical Therapy or equipment needs. PT is signing off. Thank you for this referral.     Follow Up Recommendations No PT follow up    Equipment Recommendations  Other (comment) (qualifies for supplemental oxygen)    Recommendations for Other Services       Precautions / Restrictions Precautions Precaution Comments: monitor sats      Mobility  Bed Mobility Overal bed mobility: Modified Independent                Transfers Overall transfer level: Needs assistance   Transfers: Sit to/from Stand;Stand Pivot Transfers Sit to Stand: Supervision Stand pivot transfers: Supervision       General transfer comment: supervision for safety however pt appears at baseline  Ambulation/Gait             General Gait Details: nonambulatory  Stairs            Wheelchair Mobility    Modified Rankin (Stroke Patients Only)       Balance                                             Pertinent Vitals/Pain Pain Assessment: 0-10 Pain Score: 7  Pain Location: R hip Pain Descriptors / Indicators: Sore Pain Intervention(s): Limited activity within patient's tolerance;Monitored during session;RN gave pain meds during session;Repositioned    Home Living Family/patient expects to be discharged to:: Private  residence Living Arrangements: Children Available Help at Discharge: Family   Home Access: Level entry     Home Layout: One level Home Equipment: Wheelchair - power      Prior Function Level of Independence: Independent with assistive device(s)         Comments: uses power w/c for mobility - approx 10 years due to R hip pain     Hand Dominance        Extremity/Trunk Assessment        Lower Extremity Assessment Lower Extremity Assessment: Overall WFL for tasks assessed       Communication   Communication: No difficulties  Cognition Arousal/Alertness: Awake/alert Behavior During Therapy: WFL for tasks assessed/performed Overall Cognitive Status: Within Functional Limits for tasks assessed                      General Comments      Exercises     Assessment/Plan    PT Assessment Patent does not need any further PT services  PT Problem List            PT Treatment Interventions      PT Goals (Current goals can be found in the Care Plan section)  Acute Rehab PT Goals PT Goal Formulation: All assessment and education complete, DC therapy  Frequency     Barriers to discharge        Co-evaluation               End of Session Equipment Utilized During Treatment: Oxygen Activity Tolerance: Patient tolerated treatment well Patient left: with call bell/phone within reach;in chair;with nursing/sitter in room Nurse Communication: Mobility status         Time: [id number] PT Time Calculation (min) (ACUTE ONLY): 13 min   Charges:   PT Evaluation $PT Eval Low Complexity: 1 Procedure     PT G Codes:        Aiyla Baucom,KATHrine E 09/19/2016, 1:12 PM Carmelia Bake, PT, DPT 09/19/2016 Pager: [phone]

## 2016-09-20 DIAGNOSIS — J441 Chronic obstructive pulmonary disease with (acute) exacerbation: Principal | ICD-10-CM

## 2016-09-20 LAB — GLUCOSE, CAPILLARY
GLUCOSE-CAPILLARY: 213 mg/dL — AB (ref 65–99)
Glucose-Capillary: 136 mg/dL — ABNORMAL HIGH (ref 65–99)
Glucose-Capillary: 132 mg/dL — ABNORMAL HIGH (ref 65–99)
Glucose-Capillary: 203 mg/dL — ABNORMAL HIGH (ref 65–99)
Glucose-Capillary: 187 mg/dL — ABNORMAL HIGH (ref 65–99)

## 2016-09-20 MED ORDER — IBUPROFEN 200 MG PO TABS
600.0000 mg | ORAL_TABLET | Freq: Once | ORAL | Status: AC
  Administered 2016-09-20: 600 mg via ORAL
  Filled 2016-09-20: qty 3

## 2016-09-20 MED ORDER — IBUPROFEN 100 MG/5ML PO SUSP
600.0000 mg | Freq: Once | ORAL | Status: DC
  Filled 2016-09-20: qty 30

## 2016-09-20 MED ORDER — LEVOFLOXACIN 750 MG PO TABS
750.0000 mg | ORAL_TABLET | Freq: Every day | ORAL | Status: DC
  Administered 2016-09-20 – 2016-09-21 (×2): 750 mg via ORAL
  Filled 2016-09-20 (×2): qty 1

## 2016-09-20 NOTE — Progress Notes (Signed)
PROGRESS NOTE    Lisa Ewing  [id number] DOB: 01/31/1956 DOA: 09/18/2016 PCP: Karis Juba, PA-C    Brief Narrative:  Lisa Ewing is a 61 y.o. female with medical history significant of COPD, morbid obesity, chronic pain presented to PCP office today with concerns for headache, cough and chest pain for the past week. Her caregiver who comes with her to all of her visits went to PCP with her today.  Per that note " Lisa Ewing was last in her normal state of health around Christmas time. States that last week she 'developed a bad cough and was giving her Delsym.' States that last night at 3 AM she was called by patient's daughter stating that they were calling EMS and they were wanting the caregiver to come over there to help them. States that they were calling EMS because patient had coughed until she vomited and was feeling presyncope. They state the EMS checked her but it was decided that she could stay home."  Patient oxygen saturation was 80% while at PCP office and she was placed on 3L [place] and sent to [facility]. Patient reports that her son was sick with a viral syndrome when he returned home for the holidays from college.  Patient reports she has not been feeling well since the 30th of December.  Patient says she has been hospitalized previously 2/2 respiratory problems.  Baseline is she does not wear oxygen.  Cough is productive of yellowish green sputum.  No chest pain, no chest pressure.  Does not feel particularly short of breath.  Does have a nebulizer machine at home.  1 episode of emesis secondary to coughing this am.  No other episodes of emesis, no abdominal pain.     Assessment & Plan:   Principal Problem:   COPD (chronic obstructive pulmonary disease) (HCC) Active Problems:   GERD   Chronic low back pain   Hypothyroid   COPD exacerbation (HCC)   Tachycardia   COPD exacerbation - continue levaquin and azithromycin - Duonebs QID - still wheezing and requiring 2L [place]  oxygen at rest - Prednisone 40mg  daily - albuterol PRN - influenza negative - respiratory panel positive for RSV - blood cultures showing NG x 2 days - flutter valve  Chronic low back pain - continue Norco 10-325mg  TID - continue gabapentin  Shingles "prophylaxis" - continue acyclovir daily  Tobacco Abuse - will order nicotine replacement  Depression - continue Lexapro  Hypothyroidism - continue with synthroid at home dose  DM Type 2 - restart tradjenta - SSI and CBG's - monitor CBG's with steroid use - fasting blood sugar reasonably controlled this am  Debility - will have patient seen by PT/OT - no needs identified     DVT prophylaxis: Lovenox Code Status:  Full Code Family Communication:  No family bedside Disposition Plan: Likely discharge back to previous home environment in the next 24-48 hours   Consultants:   PT  Procedures:   None  Antimicrobials:   Levaquin  Azithromycin    Subjective: Patient seen shortly after eating breakfast.  Mentions that she still feels very tight in the chest during her coughing spells and voices that her coughing spells are starting be less frequent but she is still having them.      Objective: Vitals:   09/20/16 0850 09/20/16 0918 09/20/16 1149 09/20/16 1451  BP:  (!) 124/59  (!) 161/76  Pulse:  94  89  Resp:    20  Temp:  97.4 F (36.3 C)  TempSrc:    Oral  SpO2: 97% 91% 97% 97%  Weight:      Height:        Intake/Output Summary (Last 24 hours) at 09/20/16 1504 Last data filed at 09/20/16 0900  Gross per 24 hour  Intake             1210 ml  Output                0 ml  Net             1210 ml   Filed Weights   09/18/16 2040 09/19/16 0624  Weight: 129.1 kg (284 lb 9.8 oz) 129.1 kg (284 lb 9.8 oz)    Examination:  General exam: Appears calm and comfortable  Respiratory system: expiratory wheezing, prolonged expiratory phase, no accessory muscle use Cardiovascular system: S1 & S2  heard, RRR. No JVD, murmurs, rubs, gallops or clicks. No pedal edema. Gastrointestinal system: Abdomen is obese, nondistended, soft and nontender. No organomegaly or masses felt. Normal bowel sounds heard. Central nervous system: Alert and oriented. No focal neurological deficits. Extremities: Symmetric 5 x 5 power. Skin: No rashes, lesions or ulcers Psychiatry: Judgement and insight appear normal. Mood & affect appropriate.     Data Reviewed: I have personally reviewed following labs and imaging studies  CBC:  Recent Labs Lab 09/18/16 1447  WBC 13.0*  NEUTROABS 9.7*  HGB 12.5  HCT 40.2  MCV 79.6  PLT [id number]   Basic Metabolic Panel:  Recent Labs Lab 09/18/16 1447 09/19/16 0501  NA 137 140  K 3.9 4.4  CL 98* 99*  CO2 30 31  GLUCOSE 153* 181*  BUN 17 18  CREATININE 0.88 0.99  CALCIUM 8.3* 8.3*   GFR: Estimated Creatinine Clearance: 81.9 mL/min (by C-G formula based on SCr of 0.99 mg/dL). Liver Function Tests:  Recent Labs Lab 09/18/16 1447  AST 22  ALT 21  ALKPHOS 59  BILITOT 0.4  PROT 7.3  ALBUMIN 3.9   No results for input(s): LIPASE, AMYLASE in the last 168 hours. No results for input(s): AMMONIA in the last 168 hours. Coagulation Profile: No results for input(s): INR, PROTIME in the last 168 hours. Cardiac Enzymes: No results for input(s): CKTOTAL, CKMB, CKMBINDEX, TROPONINI in the last 168 hours. BNP (last 3 results) No results for input(s): PROBNP in the last 8760 hours. HbA1C: No results for input(s): HGBA1C in the last 72 hours. CBG:  Recent Labs Lab 09/19/16 1636 09/19/16 2015 09/20/16 0530 09/20/16 0745 09/20/16 1134  GLUCAP 190* 213* 136* 132* 203*   Lipid Profile: No results for input(s): CHOL, HDL, LDLCALC, TRIG, CHOLHDL, LDLDIRECT in the last 72 hours. Thyroid Function Tests: No results for input(s): TSH, T4TOTAL, FREET4, T3FREE, THYROIDAB in the last 72 hours. Anemia Panel: No results for input(s): VITAMINB12, FOLATE, FERRITIN,  TIBC, IRON, RETICCTPCT in the last 72 hours. Sepsis Labs:  Recent Labs Lab 09/18/16 1457 09/18/16 1804  LATICACIDVEN 1.65 2.50*    Recent Results (from the past 240 hour(s))  Blood culture (routine x 2)     Status: None (Preliminary result)   Collection Time: 09/18/16  2:47 PM  Result Value Ref Range Status   Specimen Description BLOOD RIGHT ANTECUBITAL  Final   Special Requests BOTTLES DRAWN AEROBIC AND ANAEROBIC 5 CC  Final   Culture   Final    NO GROWTH 2 DAYS Performed at [facility]    Report Status PENDING  Incomplete  Blood  culture (routine x 2)     Status: None (Preliminary result)   Collection Time: 09/18/16  2:51 PM  Result Value Ref Range Status   Specimen Description BLOOD RIGHT HAND  Final   Special Requests BOTTLES DRAWN AEROBIC AND ANAEROBIC 5 CC  Final   Culture   Final    NO GROWTH 2 DAYS Performed at [facility]    Report Status PENDING  Incomplete  Respiratory Panel by PCR     Status: Abnormal   Collection Time: 09/18/16  8:17 PM  Result Value Ref Range Status   Adenovirus NOT DETECTED NOT DETECTED Final   Coronavirus 229E NOT DETECTED NOT DETECTED Final   Coronavirus HKU1 NOT DETECTED NOT DETECTED Final   Coronavirus NL63 NOT DETECTED NOT DETECTED Final   Coronavirus OC43 NOT DETECTED NOT DETECTED Final   Metapneumovirus NOT DETECTED NOT DETECTED Final   Rhinovirus / Enterovirus NOT DETECTED NOT DETECTED Final   Influenza A NOT DETECTED NOT DETECTED Final   Influenza B NOT DETECTED NOT DETECTED Final   Parainfluenza Virus 1 NOT DETECTED NOT DETECTED Final   Parainfluenza Virus 2 NOT DETECTED NOT DETECTED Final   Parainfluenza Virus 3 NOT DETECTED NOT DETECTED Final   Parainfluenza Virus 4 NOT DETECTED NOT DETECTED Final   Respiratory Syncytial Virus DETECTED (A) NOT DETECTED Final    Comment: CRITICAL RESULT CALLED TO, READ BACK BY AND VERIFIED WITH: K PHILLIPS,RN AT 1203 09/19/16 BY L BENFIELD    Bordetella pertussis NOT DETECTED  NOT DETECTED Final   Chlamydophila pneumoniae NOT DETECTED NOT DETECTED Final   Mycoplasma pneumoniae NOT DETECTED NOT DETECTED Final    Comment: Performed at [facility]         Radiology Studies: Dg Chest 2 View  Result Date: 09/18/2016 CLINICAL DATA:  Shortness of breath, chest tightness, long time smoking history EXAM: CHEST  2 VIEW COMPARISON:  Chest x-ray of 10/12/2013 FINDINGS: No active infiltrate or effusion is seen. Mediastinal and hilar contours are unremarkable. Moderate cardiomegaly is stable. No acute bony abnormality is seen. IMPRESSION: Stable moderate cardiomegaly.  No active lung disease. Electronically Signed   By: Ivar Drape M.D.   On: 09/18/2016 15:21        Scheduled Meds: . acyclovir  400 mg Oral Daily  . atorvastatin  40 mg Oral q1800  . enoxaparin (LOVENOX) injection  40 mg Subcutaneous Q24H  . escitalopram  20 mg Oral Daily  . furosemide  20 mg Oral Daily  . gabapentin  300 mg Oral Q0600  . HYDROcodone-acetaminophen  1 tablet Oral TID  . insulin aspart  0-15 Units Subcutaneous TID WC  . ipratropium-albuterol  3 mL Nebulization QID  . levofloxacin  750 mg Oral Daily  . levothyroxine  137 mcg Oral QAC breakfast  . linagliptin  5 mg Oral Daily  . methocarbamol  500 mg Oral TID  . mometasone-formoterol  2 puff Inhalation BID  . nicotine  14 mg Transdermal Daily  . pantoprazole  40 mg Oral BID  . predniSONE  40 mg Oral Q breakfast  . protein supplement shake  11 oz Oral Q24H  . raloxifene  60 mg Oral Daily  . traZODone  100 mg Oral QHS   Continuous Infusions:   LOS: 2 days    Time spent: 30 minutes    Loretha Stapler, MD [facility] Pager [phone]  If 7PM-7AM, please contact night-coverage [email or web address] Password [facility] 09/20/2016, 3:04 PM

## 2016-09-20 NOTE — Progress Notes (Addendum)
SATURATION QUALIFICATIONS: (This note is used to comply with regulatory documentation for home oxygen)  Patient Saturations on Room Air at Rest = 87%  Patient Saturations on Room Air while Ambulating = pt is non ambulatory  Pt increased to 96% on 2lnc at rest.

## 2016-09-20 NOTE — Care Management Note (Signed)
Case Management Note  Patient Details  Name: Lisa Ewing MRN: [id number] Date of Birth: 04/29/1956  Subjective/Objective:                    Action/Plan:   Expected Discharge Date:   (unknown)               Expected Discharge Plan:  Home/Self Care  In-House Referral:     Discharge planning Services  CM Consult  Post Acute Care Choice:  Durable Medical Equipment Choice offered to:  Patient  DME Arranged:  Oxygen DME Agency:  [place]:  NA [place] Agency:  NA  Status of Service:  Completed, signed off  If discussed at [place] of Stay Meetings, dates discussed:    Additional Comments: CM received call from RN to please arrange for home O2.  CM called [facility] rep, Jermaine to get authorization and notified [place] [employer] rep, Reggie to please deliver the transportation tank to room priro to discharge.  No other CM needs were communicated. Dellie Catholic, RN 09/20/2016, 11:59 AM

## 2016-09-20 NOTE — Discharge Instructions (Signed)
Chronic Obstructive Pulmonary Disease Chronic obstructive pulmonary disease (COPD) is a common lung problem. In COPD, the flow of air from the lungs is limited. The way your lungs work will probably never return to normal, but there are things you can do to improve your lungs and make yourself feel better. Your doctor may treat your condition with:  Medicines.  Oxygen.  Lung surgery.  Changes to your diet.  Rehabilitation. This may involve a team of specialists. Follow these instructions at home:  Take all medicines as told by your doctor.  Avoid medicines or cough syrups that dry up your airway (such as antihistamines) and do not allow you to get rid of thick spit. You do not need to avoid them if told differently by your doctor.  If you smoke, stop. Smoking makes the problem worse.  Avoid being around things that make your breathing worse (like smoke, chemicals, and fumes).  Use oxygen therapy and therapy to help improve your lungs (pulmonary rehabilitation) if told by your doctor. If you need home oxygen therapy, ask your doctor if you should buy a tool to measure your oxygen level (oximeter).  Avoid people who have a sickness you can catch (contagious).  Avoid going outside when it is very hot, cold, or humid.  Eat healthy foods. Eat smaller meals more often. Rest before meals.  Stay active, but remember to also rest.  Make sure to get all the shots (vaccines) your doctor recommends. Ask your doctor if you need a pneumonia shot.  Learn and use tips on how to relax.  Learn and use tips on how to control your breathing as told by your doctor. Try: 1. Breathing in (inhaling) through your nose for 1 second. Then, pucker your lips and breath out (exhale) through your lips for 2 seconds. 2. Putting one hand on your belly (abdomen). Breathe in slowly through your nose for 1 second. Your hand on your belly should move out. Pucker your lips and breathe out slowly through your lips.  Your hand on your belly should move in as you breathe out.  Learn and use controlled coughing to clear thick spit from your lungs. The steps are: 1. Lean your head a little forward. 2. Breathe in deeply. 3. Try to hold your breath for 3 seconds. 4. Keep your mouth slightly open while coughing 2 times. 5. Spit any thick spit out into a tissue. 6. Rest and do the steps again 1 or 2 times as needed. Contact a doctor if:  You cough up more thick spit than usual.  There is a change in the color or thickness of the spit.  It is harder to breathe than usual.  Your breathing is faster than usual. Get help right away if:  You have shortness of breath while resting.  You have shortness of breath that stops you from:  Being able to talk.  Doing normal activities.  You chest hurts for longer than 5 minutes.  Your skin color is more blue than usual.  Your pulse oximeter shows that you have low oxygen for longer than 5 minutes. This information is not intended to replace advice given to you by your health care provider. Make sure you discuss any questions you have with your health care provider. Document Released: 02/11/2008 Document Revised: 01/31/2016 Document Reviewed: 04/21/2013 Elsevier Interactive Patient Education  2017 Elsevier Inc.   Respiratory Syncytial Virus, Adult Respiratory syncytial virus (RSV) is a common viral infection. It is caused by a virus that  is similar to viruses that cause cold and flu symptoms. RSV can affect the nose, throat, and upper air passages (upper respiratory system) and the windpipe and lungs (lower respiratory system). If RSV affects the air passages in your lungs, you have bronchiolitis. If RSV affects your lungs, you have pneumonia. RSV spreads from person to person (is contagious) through droplets from coughs and sneezes (respiratory secretions). RSV is rarely serious when it occurs in adults. What are the causes? An RSV infection is caused by  the respiratory syncytial virus. When a sick person coughs or sneezes, there are particles of the virus in the droplets. You can get sick if you breathe in (inhale) those droplets. The virus can also survive for a while in droplets that land on surfaces. If you touch a contaminated surface and then touch your face, the virus can enter your body through your mouth, nose, or eyes. The virus also spreads through kissing, close contact, and shared eating or drinking utensils. What increases the risk? You may have a higher risk for RSV infection if:  You are 60 or older.  You have a long-term (chronic) lung condition, such as COPD.  You have a weakened disease-fighting system (immune system).  You have Down syndrome.  You have heart disease.  You work in a hospital or other health care facility.  You live in a long-term health care facility. What are the signs or symptoms? Symptoms of RSV include:  Runny nose.  Coughing. You may have a cough that brings up mucus (productive cough).  Sneezing.  Fever.  Decreased appetite.  Breathing loudly (wheezing).  Shortness of breath.  Fluid buildup in the lungs (respiratory distress). How is this diagnosed? This condition may be diagnosed based on:  Your symptoms.  Your medical history.  A physical exam.  A chest X-ray to rule out pneumonia.  Blood tests or tests of mucus from your lungs (sputum). These tests may be done if you are older.  A test of your respiratory secretions. How is this treated? In most cases, the RSV infection will go away after 1-2 weeks of caring for yourself at home. If you have severe RSV and you develop pneumonia, you may need to be treated in the hospital with oxygen, antibiotic medicine, and medicines to open your breathing tubes (bronchodilators). Follow these instructions at home:  Take over-the-counter and prescription medicines only as told by your health care provider.  Drink enough fluid to keep  your urine clear or pale yellow.  Rest at home until your symptoms go away.  Eat a healthy diet.  Do not use any products that contain nicotine or tobacco, such as cigarettes and e-cigarettes. If you need help quitting, ask your health care provider.  Keep all follow-up visits as told by your health care provider. This is important. How is this prevented? To prevent catching and spreading the RSV virus:  Wash your hands often with soap and water. If soap and water are not available, use an alcohol-based hand sanitizer. If you have not cleaned your hands, do not touch your face.  If you have cold-like or flu-like symptoms, stay home.  Cover your nose and mouth when you cough or sneeze.  Avoid large groups of people.  Keep a safe distance from people who are coughing and sneezing. Contact a health care provider if:  Your symptoms get worse.  Your symptoms have not improved after 2 weeks.  You have a fever.  You have continuing (persistent) sweatiness, hot  flashes, or chills.  You cough up much more mucus than usual.  You cough up blood.  You feel very tired (are lethargic).  You become confused.  You have respiratory distress that gets worse. This information is not intended to replace advice given to you by your health care provider. Make sure you discuss any questions you have with your health care provider. Document Released: 02/05/2016 Document Revised: 03/14/2016 Document Reviewed: 02/05/2016 Elsevier Interactive Patient Education  2017 [employer].

## 2016-09-21 LAB — GLUCOSE, CAPILLARY
Glucose-Capillary: 114 mg/dL — ABNORMAL HIGH (ref 65–99)
Glucose-Capillary: 188 mg/dL — ABNORMAL HIGH (ref 65–99)

## 2016-09-21 MED ORDER — LEVOFLOXACIN 750 MG PO TABS: 750.0000 mg | ORAL_TABLET | Freq: Every day | ORAL | 0 refills | Status: DC

## 2016-09-21 MED ORDER — LEVOFLOXACIN 750 MG PO TABS: 750.0000 mg | ORAL_TABLET | Freq: Every day | ORAL | 0 refills | Status: AC

## 2016-09-21 MED ORDER — PREDNISONE 20 MG PO TABS: 40.0000 mg | ORAL_TABLET | Freq: Every day | ORAL | 0 refills | Status: DC

## 2016-09-21 MED ORDER — PREDNISONE 20 MG PO TABS: 40.0000 mg | ORAL_TABLET | Freq: Every day | ORAL | 0 refills | Status: AC

## 2016-09-21 NOTE — Progress Notes (Signed)
D/C instructions reviewed w/ pt and caregiver. Both verbalize understanding and all questions answered. Pt d/c in w/c in stable condition by NT to caregiver's car. Pt in possession of d/c instructions, O2 tank, and all personal belongings at time of d/c. Aware of need to p/u scripts on way home.

## 2016-09-21 NOTE — Progress Notes (Signed)
SATURATION QUALIFICATIONS: (This note is used to comply with regulatory documentation for home oxygen)  Patient Saturations on Room Air at Rest = 93%  Patient Saturations on Room Air while Ambulating = pt non-ambulatory, O2 sat 83% with stand/pivot to BSC  O2 sat increased to 96% on 2lnc after stand/pivot back to bed.   Please briefly explain why patient needs home oxygen:

## 2016-09-21 NOTE — Care Management Note (Signed)
Case Management Note  Patient Details  Name: DELEAH RAWLINGS MRN: [id number] Date of Birth: Feb 18, 1956  Subjective/Objective:                  61 y.o.femalewith medical history significant of COPD, morbid obesity, chronic pain.   Action/Plan: RNCM spoke with patient's nurse, states patient ready for discharge, and portable oxygen task has been delivered to patient's room, and wanted to confirm home oxygen set logistics. Telephone call to Brenton Grills [phone]) at [place], advised patient will be discharged today, and confirmed in home oxygen set up will be completed upon discharge. Patient's nurse advised of above and AVS updated.  Expected Discharge Date:  09/21/16               Expected Discharge Plan:  Home/Self Care  In-House Referral:     Discharge planning Services  CM Consult  Post Acute Care Choice:  Durable Medical Equipment Choice offered to:  Patient  DME Arranged:  Oxygen DME Agency:  [place]:  NA [place] Agency:  NA  Status of Service:  Completed, signed off  If discussed at [place] of Stay Meetings, dates discussed:    Additional Comments:  Serena Colonel, RN 09/21/2016, 2:27 PM

## 2016-09-21 NOTE — Progress Notes (Signed)
Nutrition Brief Follow-up  Patient identified on the Malnutrition Screening Tool (MST) Report  Patient sleeping soundly at time of visit. Pt with Premier Protein at bedside and per chart, pt drinking supplements. PO intake: 100% x multiple meals.  Wt Readings from Last 15 Encounters:  09/19/16 284 lb 9.8 oz (129.1 kg)  04/24/16 286 lb 12.8 oz (130.1 kg)  09/21/15 279 lb 6.4 oz (126.7 kg)  04/18/15 255 lb (115.7 kg)  03/01/15 255 lb (115.7 kg)  02/12/15 255 lb (115.7 kg)  02/07/15 255 lb (115.7 kg)  12/28/14 255 lb (115.7 kg)  12/21/14 255 lb 12.8 oz (116 kg)  06/29/14 300 lb (136.1 kg)  05/31/14 250 lb (113.4 kg)  05/16/14 250 lb (113.4 kg)  05/02/14 240 lb (108.9 kg)  04/04/14 250 lb (113.4 kg)  02/10/14 245 lb (111.1 kg)    Body mass index is 47.36 kg/m. Patient meets criteria for morbid obesity based on current BMI.   Current diet order is Heart Healthy/CHO modified, patient is consuming approximately 100% of meals at this time. Labs and medications reviewed.   No nutrition interventions warranted at this time. If nutrition issues arise, please consult RD.   Clayton Bibles, MS, RD, LDN Pager: [phone] After Hours Pager: [phone]

## 2016-09-21 NOTE — Discharge Summary (Signed)
Physician Discharge Summary  Lisa Ewing [id number] DOB: [birth year] DOA: 09/18/2016  PCP: Karis Juba, PA-C  Admit date: 09/18/2016 Discharge date: 09/21/2016  Admitted From: Home Disposition:  Home  Recommendations for Outpatient Follow-up:  1. Follow up with PCP in 1 weeks 2. Take new prescription as prescribed 3. Use oxygen as instructed 4. Discuss lung function test and possible sleep study with your PCP 5. Please obtain BMP/CBC in one week  Home Health: No Equipment/Devices: Oxygen at 2L [place]  Discharge Condition: Stable CODE STATUS: Full code Diet recommendation: Heart Healthy / Carb Modified   Brief/Interim Summary: Lisa Volpe Brownis a 61 y.o.femalewith medical history significant of COPD, morbid obesity, chronic pain presented to PCP office today with concerns for headache, cough and chest pain for the past week. Her caregiver who comes with her to all of her visits went to PCP with her today. Per that note "[name] was last in her normal state of health around Christmas time. States that last week she 'developed a bad cough and was giving her Delsym.' States that last night at 3 AM she was called by patient's daughter stating that they were calling EMS and they were wanting the caregiver to come over there to help them. States that they were calling EMS because patient had coughed until she vomited and was feeling presyncope. They state the EMS checked her but it was decided that she could stay home." Patient oxygen saturation was 80% while at PCP office and she was placed on 3L [place] and sent to [facility]. Patient reports that her son was sick with a viral syndrome when he returned home for the holidays from college. Patient reports she has not been feeling well since the 30th of December. Patient says she has been hospitalized previously 2/2 respiratory problems. Baseline is she does not wear oxygen. Cough is productive of yellowish green sputum. No chest pain, no chest  pressure. Does not feel particularly short of breath. Does have a nebulizer machine at home. 1 episode of emesis secondary to coughing this am. No other episodes of emesis, no abdominal pain.  Patient supplemental oxygen was slowly titrated down but she was still requiring 1.5-2L [place] at time of discharge.  She was sent home on antibiotics as well as steroids and encouraged to follow up with her provider to help determine when she did not need oxygen.  Discharge Diagnoses:  Principal Problem:   COPD (chronic obstructive pulmonary disease) ([place]) Active Problems:   GERD   Chronic low back pain   Hypothyroid   COPD exacerbation (HCC)   Tachycardia  COPD exacerbation - continue levaquin - Duonebs QID - still wheezing and requiring 2L [place] oxygen at rest - Prednisone 40mg  daily - albuterol PRN - influenza negative - respiratory panel positive for RSV - blood cultures showing NG x 2 days - flutter valve  Chronic low back pain - continue Norco 10-325mg  TID - continue gabapentin  Shingles "prophylaxis" - continue acyclovir daily  Tobacco Abuse - will order nicotine replacement - encouraged cessation  Depression - continue Lexapro  Hypothyroidism - continue with synthroid at home dose  DM Type 2 - restart tradjenta - restart home medications at discharge  Debility - will have patient seen by PT/OT - no needs identified  Discharge Instructions  Discharge Instructions    Call MD for:  difficulty breathing, headache or visual disturbances    Complete by:  As directed    Call MD for:  extreme fatigue  Complete by:  As directed    Call MD for:  hives    Complete by:  As directed    Call MD for:  persistant dizziness or light-headedness    Complete by:  As directed    Call MD for:  persistant nausea and vomiting    Complete by:  As directed    Call MD for:  severe uncontrolled pain    Complete by:  As directed    Call MD for:  temperature >100.4    Complete  by:  As directed    Diet - low sodium heart healthy    Complete by:  As directed    Diet Carb Modified    Complete by:  As directed    Discharge instructions    Complete by:  As directed    Use oxygen as instructed Talk to your doctor about possible lung function test as well as possible sleep study   Increase activity slowly    Complete by:  As directed      Allergies as of 09/21/2016      Reactions   Bee Venom Anaphylaxis   Latex Shortness Of Breath   Oxycodone-acetaminophen Hives, Shortness Of Breath   Upset Stomach   Penicillins Anaphylaxis   Throat swells Has patient had a PCN reaction causing immediate rash, facial/tongue/throat swelling, SOB or lightheadedness with hypotension: yes Has patient had a PCN reaction causing severe rash involving mucus membranes or skin necrosis: no Has patient had a PCN reaction that required hospitalization- yes at hospital Has patient had a PCN reaction occurring within the last 10 years: no If all of the above answers are "NO", then may proceed with Cephalosporin use.   Shellfish Allergy Anaphylaxis   Throat swells   Codeine Nausea Only   Metoclopramide Hcl    Doesn't recall type of reaction   Other Swelling   Almonds. Bananas cause an asthma attack.    Pregabalin Swelling   Sulfonamide Derivatives    Tongue swells   Adhesive [tape] Rash   Wellbutrin [bupropion] Rash   Hives, red whelps      Medication List    STOP taking these medications   ciprofloxacin 500 MG tablet Commonly known as:  CIPRO   nystatin 100000 UNIT/ML suspension Commonly known as:  MYCOSTATIN     TAKE these medications   ACCU-CHEK FASTCLIX LANCETS Misc 1 each by Other route daily.   acyclovir 400 MG tablet Commonly known as:  ZOVIRAX TAKE 1 TABLET BY MOUTH EVERY DAY What changed:  See the new instructions.   ADVAIR DISKUS 250-50 MCG/DOSE Aepb Generic drug:  Fluticasone-Salmeterol TAKE 1 INHALATION BY MOUTH TWICE DAILY.  RINSE MOUTH AFTER USE.    atorvastatin 40 MG tablet Commonly known as:  LIPITOR TAKE 1 TABLET BY MOUTH EVERY DAY AT 6PM What changed:  See the new instructions.   calcitRIOL 0.25 MCG capsule Commonly known as:  ROCALTROL TAKE ONE CAPSULE BY MOUTH ONCE DAILY What changed:  See the new instructions.   calcium-vitamin D 500-400 MG-UNIT tablet Commonly known as:  OSCAL-500 Take 1 tablet by mouth 2 (two) times daily.   cetirizine 10 MG tablet Commonly known as:  ZYRTEC TAKE ONE TABLET BY MOUTH DAILY FOR ALLERGIES What changed:  See the new instructions.   EPINEPHrine 0.3 mg/0.3 mL Soaj injection Commonly known as:  EPI-PEN INJECT 1 PEN INTRAMUSCULARLY AS NEEDED FOR ALLERGIC REACTION   escitalopram 20 MG tablet Commonly known as:  LEXAPRO TAKE 1 TABLET BY MOUTH EVERY  DAY What changed:  See the new instructions.   EVISTA 60 MG tablet Generic drug:  raloxifene Take 1 tablet (60 mg total) by mouth daily.   Fenofibric Acid 135 MG Cpdr TAKE 1 CAPSULE BY MOUTH EVERY DAY What changed:  See the new instructions.   furosemide 20 MG tablet Commonly known as:  LASIX TAKE ONE TABLET BY MOUTH EVERY DAY What changed:  See the new instructions.   gabapentin 300 MG capsule Commonly known as:  NEURONTIN TAKE 1 CAPSULE BY MOUTH THREE TIMES A DAY AND TAKE 2 CAPSULES EVERY NIGHT AT BEDTIME What changed:  See the new instructions.   glucose blood test strip Commonly known as:  ACCU-CHEK AVIVA PLUS 1 each by Other route daily. Use as instructed   ACCU-CHEK AVIVA PLUS test strip Generic drug:  glucose blood USE TO TEST BLOOD SUGAR ONCE DAILY   HYDROcodone-acetaminophen 10-325 MG tablet Commonly known as:  NORCO Take 1 tablet by mouth 3 (three) times daily.   hyoscyamine 0.125 MG SL tablet Commonly known as:  LEVSIN SL PLACE 1 TABLET UNDER THE TONGUE EVERY 4 HOURS AS NEEDED What changed:  See the new instructions.   ipratropium-albuterol 0.5-2.5 (3) MG/3ML Soln Commonly known as:  DUONEB INHALE THE  CONTENTS OF ONE VIAL VIA NEBULIZER BY MOUTH FOUR TIMES A DAY AS NEEDED FOR WHEEZING OR SHORTNESS OF BREATH   JANUVIA 100 MG tablet Generic drug:  sitaGLIPtin TAKE 1 TABLET BY MOUTH EVERY DAY   levofloxacin 750 MG tablet Commonly known as:  LEVAQUIN Take 1 tablet (750 mg total) by mouth daily. Start taking on:  09/22/2016   levothyroxine 137 MCG tablet Commonly known as:  SYNTHROID, LEVOTHROID TAKE 1 TABLET BY MOUTH DAILY FOR BEFORE BREAKFAST   metFORMIN 1000 MG tablet Commonly known as:  GLUCOPHAGE TAKE 1 TABLET BY MOUTH TWICE DAILY WITH A MEAL   methocarbamol 500 MG tablet Commonly known as:  ROBAXIN TAKE ONE TABLET BY MOUTH THREE TIMES DAILY. What changed:  See the new instructions.   montelukast 10 MG tablet Commonly known as:  SINGULAIR TAKE ONE TABLET BY MOUTH AT BEDTIME   niacin 1000 MG CR tablet Commonly known as:  NIASPAN TAKE 2 TABLETS BY MOUTH AT BEDTIME   nystatin cream Commonly known as:  MYCOSTATIN APPLY 1 APPLICATION TO AFFECTED AREA(S) TWICE DAILY What changed:  See the new instructions.   pantoprazole 40 MG tablet Commonly known as:  PROTONIX TAKE ONE TABLET BY MOUTH TWO TIMES DAILY BEFORE A MEAL. What changed:  See the new instructions.   pioglitazone 45 MG tablet Commonly known as:  ACTOS TAKE 1 TABLET BY MOUTH EVERY DAY What changed:  See the new instructions.   predniSONE 20 MG tablet Commonly known as:  DELTASONE Take 2 tablets (40 mg total) by mouth daily with breakfast. Start taking on:  09/22/2016   PROVENTIL HFA 108 (90 Base) MCG/ACT inhaler Generic drug:  albuterol USE 2 PUFFS EVERY 4 TO 6 HOURS AS NEEDED FOR SHORTNESS OF BREATH   sucralfate 1 g tablet Commonly known as:  CARAFATE TAKE 1 TABLET BY MOUTH 4 TIMES A DAY WITH MEALS AND AT BEDTIME What changed:  See the new instructions.   traZODone 100 MG tablet Commonly known as:  DESYREL Take 1 tablet (100 mg total) by mouth at bedtime.   triamcinolone 0.025 % cream Commonly  known as:  KENALOG APPLY 1 APPLICATION TOPICALLY TO AFFECTED AREA(S) TWICE DAILY What changed:  See the new instructions.   VESICARE 5 MG  tablet Generic drug:  solifenacin TAKE 1 TABLET BY MOUTH EVERY DAY What changed:  See the new instructions.   Vitamin D3 2000 units capsule TAKE 1 CAPSULE BY MOUTH EVERY DAY What changed:  See the new instructions.   VOLTAREN 1 % Gel Generic drug:  diclofenac sodium APPLY 2 GRAMS TOPICALLY FOUR TIMES A DAY What changed:  See the new instructions.   ZETIA 10 MG tablet Generic drug:  ezetimibe TAKE 1 TABLET BY MOUTH DAILY What changed:  See the new instructions.            Durable Medical Equipment        Start     Ordered   09/20/16 1136  For home use only DME oxygen  Once    Question Answer Comment  Mode or (Route) Nasal cannula   Liters per Minute 2   Frequency Continuous (stationary and portable oxygen unit needed)   Oxygen conserving device Yes   Oxygen delivery system Gas      09/20/16 1136     Follow-up [place] Follow up.   Why:  provider of home oxygen Contact information: [place] [address] [phone]          Allergies  Allergen Reactions  . Bee Venom Anaphylaxis  . Latex Shortness Of Breath  . Oxycodone-Acetaminophen Hives and Shortness Of Breath    Upset Stomach  . Penicillins Anaphylaxis    Throat swells Has patient had a PCN reaction causing immediate rash, facial/tongue/throat swelling, SOB or lightheadedness with hypotension: yes Has patient had a PCN reaction causing severe rash involving mucus membranes or skin necrosis: no Has patient had a PCN reaction that required hospitalization- yes at hospital Has patient had a PCN reaction occurring within the last 10 years: no If all of the above answers are "NO", then may proceed with Cephalosporin use.   . Shellfish Allergy Anaphylaxis    Throat swells   . Codeine Nausea Only  .  Metoclopramide Hcl     Doesn't recall type of reaction  . Other Swelling    Almonds. Bananas cause an asthma attack.   . Pregabalin Swelling  . Sulfonamide Derivatives     Tongue swells   . Adhesive [Tape] Rash  . Wellbutrin [Bupropion] Rash    Hives, red whelps    Consultations:  Case Management   Procedures/Studies: Dg Chest 2 View  Result Date: 09/18/2016 CLINICAL DATA:  Shortness of breath, chest tightness, long time smoking history EXAM: CHEST  2 VIEW COMPARISON:  Chest x-ray of 10/12/2013 FINDINGS: No active infiltrate or effusion is seen. Mediastinal and hilar contours are unremarkable. Moderate cardiomegaly is stable. No acute bony abnormality is seen. IMPRESSION: Stable moderate cardiomegaly.  No active lung disease. Electronically Signed   By: Ivar Drape M.D.   On: 09/18/2016 15:21      Subjective: Patient seen this am prior to breakfast.  Says she really would not like to go home with oxygen but understands that she may require that.  Says she has a history of OSA but doesn't wear her CPAP as she is supposed to.  Discharge Exam: Vitals:   09/21/16 0450 09/21/16 0933  BP: (!) 151/78 128/60  Pulse: 100 92  Resp: (!) 24 (!) 21  Temp: 97.8 F (36.6 C)    Vitals:   09/21/16 0450 09/21/16 0737 09/21/16 0933 09/21/16 1229  BP: (!) 151/78  128/60   Pulse: 100  92  Resp: (!) 24  (!) 21   Temp: 97.8 F (36.6 C)     TempSrc: Oral     SpO2: 95% 93% 93% 95%  Weight:      Height:        General: Pt is alert, awake, not in acute distress Cardiovascular: RRR, S1/S2 +, no rubs, no gallops Respiratory: end expiratory wheezes throughout, no increased work of breathing, no rhonchi or rales Abdominal: Soft, NT, ND, bowel sounds + Extremities: no edema, no cyanosis    The results of significant diagnostics from this hospitalization (including imaging, microbiology, ancillary and laboratory) are listed below for reference.     Microbiology: Recent Results (from  the past 240 hour(s))  Blood culture (routine x 2)     Status: None (Preliminary result)   Collection Time: 09/18/16  2:47 PM  Result Value Ref Range Status   Specimen Description BLOOD RIGHT ANTECUBITAL  Final   Special Requests BOTTLES DRAWN AEROBIC AND ANAEROBIC 5 CC  Final   Culture   Final    NO GROWTH 3 DAYS Performed at [facility]    Report Status PENDING  Incomplete  Blood culture (routine x 2)     Status: None (Preliminary result)   Collection Time: 09/18/16  2:51 PM  Result Value Ref Range Status   Specimen Description BLOOD RIGHT HAND  Final   Special Requests BOTTLES DRAWN AEROBIC AND ANAEROBIC 5 CC  Final   Culture   Final    NO GROWTH 3 DAYS Performed at [facility]    Report Status PENDING  Incomplete  Respiratory Panel by PCR     Status: Abnormal   Collection Time: 09/18/16  8:17 PM  Result Value Ref Range Status   Adenovirus NOT DETECTED NOT DETECTED Final   Coronavirus 229E NOT DETECTED NOT DETECTED Final   Coronavirus HKU1 NOT DETECTED NOT DETECTED Final   Coronavirus NL63 NOT DETECTED NOT DETECTED Final   Coronavirus OC43 NOT DETECTED NOT DETECTED Final   Metapneumovirus NOT DETECTED NOT DETECTED Final   Rhinovirus / Enterovirus NOT DETECTED NOT DETECTED Final   Influenza A NOT DETECTED NOT DETECTED Final   Influenza B NOT DETECTED NOT DETECTED Final   Parainfluenza Virus 1 NOT DETECTED NOT DETECTED Final   Parainfluenza Virus 2 NOT DETECTED NOT DETECTED Final   Parainfluenza Virus 3 NOT DETECTED NOT DETECTED Final   Parainfluenza Virus 4 NOT DETECTED NOT DETECTED Final   Respiratory Syncytial Virus DETECTED (A) NOT DETECTED Final    Comment: CRITICAL RESULT CALLED TO, READ BACK BY AND VERIFIED WITH: K PHILLIPS,RN AT 1203 09/19/16 BY L BENFIELD    Bordetella pertussis NOT DETECTED NOT DETECTED Final   Chlamydophila pneumoniae NOT DETECTED NOT DETECTED Final   Mycoplasma pneumoniae NOT DETECTED NOT DETECTED Final    Comment: Performed at  [place]: BNP (last 3 results)  Recent Labs  09/18/16 1447  BNP [id number]   Basic Metabolic Panel:  Recent Labs Lab 09/18/16 1447 09/19/16 0501  NA 137 140  K 3.9 4.4  CL 98* 99*  CO2 30 31  GLUCOSE 153* 181*  BUN 17 18  CREATININE 0.88 0.99  CALCIUM 8.3* 8.3*   Liver Function Tests:  Recent Labs Lab 09/18/16 1447  AST 22  ALT 21  ALKPHOS 59  BILITOT 0.4  PROT 7.3  ALBUMIN 3.9   No results for input(s): LIPASE, AMYLASE in the last 168 hours. No results for input(s): AMMONIA in the  last 168 hours. CBC:  Recent Labs Lab 09/18/16 1447  WBC 13.0*  NEUTROABS 9.7*  HGB 12.5  HCT 40.2  MCV 79.6  PLT 303   Cardiac Enzymes: No results for input(s): CKTOTAL, CKMB, CKMBINDEX, TROPONINI in the last 168 hours. BNP: Invalid input(s): POCBNP CBG:  Recent Labs Lab 09/20/16 1134 09/20/16 1700 09/20/16 2054 09/21/16 0750 09/21/16 1141  GLUCAP 203* 213* 187* 114* 188*   D-Dimer No results for input(s): DDIMER in the last 72 hours. Hgb A1c No results for input(s): HGBA1C in the last 72 hours. Lipid Profile No results for input(s): CHOL, HDL, LDLCALC, TRIG, CHOLHDL, LDLDIRECT in the last 72 hours. Thyroid function studies No results for input(s): TSH, T4TOTAL, T3FREE, THYROIDAB in the last 72 hours.  Invalid input(s): FREET3 Anemia work up No results for input(s): VITAMINB12, FOLATE, FERRITIN, TIBC, IRON, RETICCTPCT in the last 72 hours. Urinalysis    Component Value Date/Time   COLORURINE YELLOW 08/11/2016 1229   APPEARANCEUR CLOUDY (A) 08/11/2016 1229   LABSPEC >1.030 08/11/2016 1229   PHURINE 5.5 08/11/2016 1229   GLUCOSEU NEGATIVE 08/11/2016 1229   HGBUR 2+ (A) 08/11/2016 1229   BILIRUBINUR NEGATIVE 08/11/2016 1229   KETONESUR NEGATIVE 08/11/2016 1229   PROTEINUR 2+ (A) 08/11/2016 1229   UROBILINOGEN 0.2 03/14/2015 1026   NITRITE POSITIVE (A) 08/11/2016 1229   LEUKOCYTESUR 3+ (A) 08/11/2016 1229   Sepsis Labs Invalid  input(s): PROCALCITONIN,  WBC,  LACTICIDVEN Microbiology Recent Results (from the past 240 hour(s))  Blood culture (routine x 2)     Status: None (Preliminary result)   Collection Time: 09/18/16  2:47 PM  Result Value Ref Range Status   Specimen Description BLOOD RIGHT ANTECUBITAL  Final   Special Requests BOTTLES DRAWN AEROBIC AND ANAEROBIC 5 CC  Final   Culture   Final    NO GROWTH 3 DAYS Performed at [facility]    Report Status PENDING  Incomplete  Blood culture (routine x 2)     Status: None (Preliminary result)   Collection Time: 09/18/16  2:51 PM  Result Value Ref Range Status   Specimen Description BLOOD RIGHT HAND  Final   Special Requests BOTTLES DRAWN AEROBIC AND ANAEROBIC 5 CC  Final   Culture   Final    NO GROWTH 3 DAYS Performed at [facility]    Report Status PENDING  Incomplete  Respiratory Panel by PCR     Status: Abnormal   Collection Time: 09/18/16  8:17 PM  Result Value Ref Range Status   Adenovirus NOT DETECTED NOT DETECTED Final   Coronavirus 229E NOT DETECTED NOT DETECTED Final   Coronavirus HKU1 NOT DETECTED NOT DETECTED Final   Coronavirus NL63 NOT DETECTED NOT DETECTED Final   Coronavirus OC43 NOT DETECTED NOT DETECTED Final   Metapneumovirus NOT DETECTED NOT DETECTED Final   Rhinovirus / Enterovirus NOT DETECTED NOT DETECTED Final   Influenza A NOT DETECTED NOT DETECTED Final   Influenza B NOT DETECTED NOT DETECTED Final   Parainfluenza Virus 1 NOT DETECTED NOT DETECTED Final   Parainfluenza Virus 2 NOT DETECTED NOT DETECTED Final   Parainfluenza Virus 3 NOT DETECTED NOT DETECTED Final   Parainfluenza Virus 4 NOT DETECTED NOT DETECTED Final   Respiratory Syncytial Virus DETECTED (A) NOT DETECTED Final    Comment: CRITICAL RESULT CALLED TO, READ BACK BY AND VERIFIED WITH: K PHILLIPS,RN AT 1203 09/19/16 BY L BENFIELD    Bordetella pertussis NOT DETECTED NOT DETECTED Final   Chlamydophila pneumoniae NOT DETECTED  NOT DETECTED Final    Mycoplasma pneumoniae NOT DETECTED NOT DETECTED Final    Comment: Performed at [facility]     Time coordinating discharge: Over 30 minutes  SIGNED:   Loretha Stapler, MD  [facility] 09/21/2016, 12:56 PM Pager [phone] If 7PM-7AM, please contact night-coverage [email or web address] Password [email or web address]

## 2016-09-22 ENCOUNTER — Encounter: Payer: Self-pay | Admitting: Family Medicine

## 2016-09-22 ENCOUNTER — Other Ambulatory Visit: Payer: Self-pay | Admitting: Family Medicine

## 2016-09-22 ENCOUNTER — Other Ambulatory Visit: Payer: Self-pay | Admitting: Physician Assistant

## 2016-09-22 MED ORDER — NICOTINE 21 MG/24HR TD PT24: 21.0000 mg | MEDICATED_PATCH | Freq: Every day | TRANSDERMAL | 0 refills | Status: DC

## 2016-09-22 NOTE — Telephone Encounter (Signed)
Nicotine patches to [facility] so they can deliver

## 2016-09-22 NOTE — Telephone Encounter (Signed)
Patient returned call and states that she started on the highest patch dose while in hospital.   Prescription sent to pharmacy for 21mg  patches.

## 2016-09-22 NOTE — Telephone Encounter (Signed)
Patient called states that she was released from the hospital yesterday. She is requesting nicotine patches called into [facility] she used them while she was in the hospital.  CB# [phone]

## 2016-09-22 NOTE — Telephone Encounter (Signed)
Call placed to patient. LMTRC.  

## 2016-09-23 LAB — CULTURE, BLOOD (ROUTINE X 2)
CULTURE: NO GROWTH
Culture: NO GROWTH

## 2016-09-25 ENCOUNTER — Inpatient Hospital Stay: Payer: Medicaid Other | Admitting: Physician Assistant

## 2016-10-02 ENCOUNTER — Encounter: Payer: Self-pay | Admitting: Physician Assistant

## 2016-10-02 ENCOUNTER — Ambulatory Visit (INDEPENDENT_AMBULATORY_CARE_PROVIDER_SITE_OTHER): Payer: Medicaid Other | Admitting: Physician Assistant

## 2016-10-02 VITALS — BP 130/84 | HR 103 | Temp 98.1°F | Resp 18

## 2016-10-02 DIAGNOSIS — Z09 Encounter for follow-up examination after completed treatment for conditions other than malignant neoplasm: Secondary | ICD-10-CM

## 2016-10-02 DIAGNOSIS — J441 Chronic obstructive pulmonary disease with (acute) exacerbation: Secondary | ICD-10-CM

## 2016-10-02 LAB — BASIC METABOLIC PANEL WITH GFR
BUN: 21 mg/dL (ref 7–25)
CHLORIDE: 102 mmol/L (ref 98–110)
CO2: 30 mmol/L (ref 20–31)
CREATININE: 0.91 mg/dL (ref 0.50–0.99)
Calcium: 9.2 mg/dL (ref 8.6–10.4)
GFR, EST NON AFRICAN AMERICAN: 69 mL/min (ref >=60)
GFR, Est African American: 79 mL/min (ref >=60)
Glucose, Bld: 101 mg/dL — ABNORMAL HIGH (ref 70–99)
POTASSIUM: 5.2 mmol/L (ref 3.5–5.3)
SODIUM: 144 mmol/L (ref 135–146)
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
## 2016-10-03 LAB — CBC WITH DIFFERENTIAL/PLATELET
BASOS ABS: 0 {cells}/uL (ref 0–200)
BASOS PCT: 0 %
EOS PCT: 2 %
Eosinophils Absolute: 254 cells/uL (ref 15–500)
HCT: 42.5 % (ref 35.0–45.0)
HEMOGLOBIN: 13.1 g/dL (ref 12.0–15.0)
Lymphocytes Relative: 26 %
Lymphs Abs: 3302 cells/uL (ref 850–3900)
MCH: 24.7 pg — ABNORMAL LOW (ref 27.0–33.0)
MCHC: 30.8 g/dL — ABNORMAL LOW (ref 32.0–36.0)
MCV: 80.2 fL (ref 80.0–100.0)
MONOS PCT: 9 %
MPV: 9.3 fL (ref 7.5–12.5)
Monocytes Absolute: 1143 cells/uL — ABNORMAL HIGH (ref 200–950)
NEUTROS ABS: 8001 {cells}/uL — AB (ref 1500–7800)
Neutrophils Relative %: 63 %
PLATELETS: 356 10*3/uL (ref 140–400)
RBC: 5.3 MIL/uL — ABNORMAL HIGH (ref 3.80–5.10)
RDW: 18.6 % — ABNORMAL HIGH (ref 11.0–15.0)
WBC: 12.7 10*3/uL — ABNORMAL HIGH (ref 3.8–10.8)

## 2016-10-03 LAB — PATHOLOGIST SMEAR REVIEW

## 2016-10-06 ENCOUNTER — Telehealth: Payer: Self-pay | Admitting: Physician Assistant

## 2016-10-06 ENCOUNTER — Telehealth: Payer: Self-pay

## 2016-10-06 NOTE — Telephone Encounter (Signed)
Patient is calling to discuss her oxygen and advanced home care please call her at [phone]

## 2016-10-06 NOTE — Telephone Encounter (Signed)
LMTCB

## 2016-10-06 NOTE — Telephone Encounter (Signed)
Pt called req to hve her oxygen tank picked up. I have faxed over the order to hve tank picked up from [facility]. LVM for pt

## 2016-10-06 NOTE — Telephone Encounter (Signed)
Lisa Ewing was calling to see if we were discontinuing all oxygen or just the one tank. Will need to fax over order discont all oxygen

## 2016-10-07 NOTE — Telephone Encounter (Signed)
Spoke to pt today when called about PFT appt.  Issue resolved

## 2016-10-08 ENCOUNTER — Other Ambulatory Visit: Payer: Self-pay | Admitting: Physician Assistant

## 2016-10-08 NOTE — Telephone Encounter (Signed)
Rx filled per protocol  

## 2016-10-09 ENCOUNTER — Other Ambulatory Visit: Payer: Self-pay | Admitting: Physician Assistant

## 2016-10-09 ENCOUNTER — Other Ambulatory Visit: Payer: Self-pay | Admitting: Family Medicine

## 2016-10-09 ENCOUNTER — Encounter: Payer: Self-pay | Admitting: Registered Nurse

## 2016-10-09 ENCOUNTER — Encounter: Payer: Medicaid Other | Attending: Physical Medicine and Rehabilitation | Admitting: Registered Nurse

## 2016-10-09 VITALS — BP 101/68 | HR 109 | Resp 12

## 2016-10-09 DIAGNOSIS — M5416 Radiculopathy, lumbar region: Secondary | ICD-10-CM | POA: Diagnosis not present

## 2016-10-09 DIAGNOSIS — J449 Chronic obstructive pulmonary disease, unspecified: Secondary | ICD-10-CM | POA: Insufficient documentation

## 2016-10-09 DIAGNOSIS — M47816 Spondylosis without myelopathy or radiculopathy, lumbar region: Secondary | ICD-10-CM | POA: Diagnosis not present

## 2016-10-09 DIAGNOSIS — K219 Gastro-esophageal reflux disease without esophagitis: Secondary | ICD-10-CM | POA: Diagnosis not present

## 2016-10-09 DIAGNOSIS — M47814 Spondylosis without myelopathy or radiculopathy, thoracic region: Secondary | ICD-10-CM | POA: Diagnosis not present

## 2016-10-09 DIAGNOSIS — E039 Hypothyroidism, unspecified: Secondary | ICD-10-CM | POA: Insufficient documentation

## 2016-10-09 DIAGNOSIS — G8929 Other chronic pain: Secondary | ICD-10-CM

## 2016-10-09 DIAGNOSIS — M7061 Trochanteric bursitis, right hip: Secondary | ICD-10-CM

## 2016-10-09 DIAGNOSIS — M79604 Pain in right leg: Secondary | ICD-10-CM | POA: Insufficient documentation

## 2016-10-09 DIAGNOSIS — M961 Postlaminectomy syndrome, not elsewhere classified: Secondary | ICD-10-CM | POA: Diagnosis not present

## 2016-10-09 DIAGNOSIS — G894 Chronic pain syndrome: Secondary | ICD-10-CM

## 2016-10-09 DIAGNOSIS — F17210 Nicotine dependence, cigarettes, uncomplicated: Secondary | ICD-10-CM | POA: Diagnosis not present

## 2016-10-09 DIAGNOSIS — E119 Type 2 diabetes mellitus without complications: Secondary | ICD-10-CM | POA: Insufficient documentation

## 2016-10-09 DIAGNOSIS — IMO0002 Reserved for concepts with insufficient information to code with codable children: Secondary | ICD-10-CM

## 2016-10-09 DIAGNOSIS — M25551 Pain in right hip: Secondary | ICD-10-CM | POA: Diagnosis not present

## 2016-10-09 DIAGNOSIS — Z720 Tobacco use: Secondary | ICD-10-CM | POA: Diagnosis not present

## 2016-10-09 DIAGNOSIS — Z5181 Encounter for therapeutic drug level monitoring: Secondary | ICD-10-CM

## 2016-10-09 DIAGNOSIS — M810 Age-related osteoporosis without current pathological fracture: Secondary | ICD-10-CM | POA: Insufficient documentation

## 2016-10-09 DIAGNOSIS — M75101 Unspecified rotator cuff tear or rupture of right shoulder, not specified as traumatic: Secondary | ICD-10-CM

## 2016-10-09 DIAGNOSIS — M25511 Pain in right shoulder: Secondary | ICD-10-CM

## 2016-10-09 DIAGNOSIS — Z79899 Other long term (current) drug therapy: Secondary | ICD-10-CM | POA: Diagnosis not present

## 2016-10-09 DIAGNOSIS — M546 Pain in thoracic spine: Secondary | ICD-10-CM | POA: Diagnosis not present

## 2016-10-09 DIAGNOSIS — M25512 Pain in left shoulder: Secondary | ICD-10-CM | POA: Diagnosis not present

## 2016-10-09 DIAGNOSIS — E785 Hyperlipidemia, unspecified: Secondary | ICD-10-CM | POA: Insufficient documentation

## 2016-10-09 MED ORDER — HYDROCODONE-ACETAMINOPHEN 10-325 MG PO TABS
1.0000 | ORAL_TABLET | Freq: Three times a day (TID) | ORAL | 0 refills | Status: DC
Start: 2016-10-09 — End: 2016-11-06

## 2016-10-09 MED ORDER — METHYLPREDNISOLONE 4 MG PO TBPK: ORAL_TABLET | ORAL | 0 refills | Status: DC
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
## 2016-10-10 ENCOUNTER — Telehealth: Payer: Self-pay | Admitting: Registered Nurse

## 2016-10-10 NOTE — Telephone Encounter (Signed)
On 10/10/2016 the [place] was reviewed no conflict was seen on the [place] with multiple prescribers. [name] has a signed narcotic contract with our office. If there were any discrepancies this would have been reported to her physician.

## 2016-10-15 ENCOUNTER — Encounter (HOSPITAL_COMMUNITY): Payer: Medicaid Other

## 2016-10-22 ENCOUNTER — Telehealth: Payer: Self-pay | Admitting: Family Medicine

## 2016-10-22 MED ORDER — CIPROFLOXACIN HCL 500 MG PO TABS: 500.0000 mg | ORAL_TABLET | Freq: Two times a day (BID) | ORAL | 0 refills | Status: DC

## 2016-10-22 NOTE — Telephone Encounter (Signed)
Pt calling with another UTI,  Dark urine, pressure.  Still feels like has to go when done.  Sitter has quit and has no way to get here.  Needs 1 week advance notice for transport.  Please advise.

## 2016-10-22 NOTE — Telephone Encounter (Signed)
Tell patient that in general we do not call in antibiotics without a visit. Tell her that given circumstances this time I will send antibiotic. Tell her that if symptoms worsen or do not resolve upon completion of antibiotic to make sure to get evaluated either here or at an urgent care. Cipro 500mg  1 po BID x 7 days  # 14 + 0

## 2016-10-22 NOTE — Telephone Encounter (Signed)
Rx filled per pt req was made to pharmacy to deliver to her home   Pt aware if symptoms do not resolve then she need to return for Ov

## 2016-10-27 ENCOUNTER — Ambulatory Visit: Payer: Medicaid Other | Admitting: Gastroenterology

## 2016-10-28 ENCOUNTER — Telehealth: Payer: Self-pay | Admitting: Physician Assistant

## 2016-10-28 NOTE — Telephone Encounter (Signed)
Patient is calling to say that the med prescribed for her for uti is not working  [phone]

## 2016-10-29 ENCOUNTER — Ambulatory Visit (HOSPITAL_COMMUNITY): Payer: Medicaid Other

## 2016-10-29 ENCOUNTER — Telehealth: Payer: Self-pay | Admitting: *Deleted

## 2016-10-29 DIAGNOSIS — J440 Chronic obstructive pulmonary disease with acute lower respiratory infection: Secondary | ICD-10-CM

## 2016-10-29 NOTE — Telephone Encounter (Signed)
Received VM from Tyra with [facility].   Reports that patient has cancelled PFT D/T illness. Reports that current order for PFT ends on 11/02/2016, but the earliest patient can be re-scheduled is 11/05/2016.  Requested new order with extended period to schedule.   Please advise.

## 2016-10-29 NOTE — Telephone Encounter (Signed)
Pt need to be seen at this point. Called pt and sch an appt to be seen in the office on 2-22

## 2016-10-30 ENCOUNTER — Ambulatory Visit (INDEPENDENT_AMBULATORY_CARE_PROVIDER_SITE_OTHER): Payer: Medicaid Other | Admitting: Physician Assistant

## 2016-10-30 ENCOUNTER — Ambulatory Visit (HOSPITAL_COMMUNITY)
Admission: RE | Admit: 2016-10-30 | Discharge: 2016-10-30 | Disposition: A | Discharge status: Home / Self Care | Payer: Medicaid Other | Source: Ambulatory Visit | Attending: Registered Nurse | Admitting: Registered Nurse

## 2016-10-30 ENCOUNTER — Encounter: Payer: Self-pay | Admitting: Physician Assistant

## 2016-10-30 ENCOUNTER — Ambulatory Visit: Payer: Self-pay | Admitting: Physician Assistant

## 2016-10-30 VITALS — BP 128/70 | HR 94 | Temp 97.9°F | Resp 18

## 2016-10-30 DIAGNOSIS — M25511 Pain in right shoulder: Secondary | ICD-10-CM

## 2016-10-30 DIAGNOSIS — M75101 Unspecified rotator cuff tear or rupture of right shoulder, not specified as traumatic: Secondary | ICD-10-CM

## 2016-10-30 DIAGNOSIS — M19011 Primary osteoarthritis, right shoulder: Secondary | ICD-10-CM | POA: Insufficient documentation

## 2016-10-30 DIAGNOSIS — R300 Dysuria: Secondary | ICD-10-CM

## 2016-10-30 DIAGNOSIS — IMO0002 Reserved for concepts with insufficient information to code with codable children: Secondary | ICD-10-CM

## 2016-10-30 DIAGNOSIS — Z720 Tobacco use: Secondary | ICD-10-CM | POA: Diagnosis not present

## 2016-10-30 DIAGNOSIS — Z716 Tobacco abuse counseling: Secondary | ICD-10-CM | POA: Diagnosis not present

## 2016-10-30 DIAGNOSIS — N3000 Acute cystitis without hematuria: Secondary | ICD-10-CM

## 2016-10-30 LAB — URINALYSIS, ROUTINE W REFLEX MICROSCOPIC
BILIRUBIN URINE: NEGATIVE
Glucose, UA: NEGATIVE
Ketones, ur: NEGATIVE
NITRITE: POSITIVE — AB
Specific Gravity, Urine: 1.025 (ref 1.001–1.035)
pH: 6.5 (ref 5.0–8.0)

## 2016-10-30 LAB — URINALYSIS, MICROSCOPIC ONLY
CRYSTALS: NONE SEEN [HPF]
Casts: NONE SEEN [LPF]

## 2016-10-30 MED ORDER — NICOTINE 7 MG/24HR TD PT24: 7.0000 mg | MEDICATED_PATCH | Freq: Every day | TRANSDERMAL | 0 refills | Status: DC

## 2016-10-30 MED ORDER — NITROFURANTOIN MONOHYD MACRO 100 MG PO CAPS: 100.0000 mg | ORAL_CAPSULE | Freq: Two times a day (BID) | ORAL | 0 refills | Status: DC

## 2016-10-30 NOTE — Telephone Encounter (Signed)
Kim see message below PLS

## 2016-10-30 NOTE — Telephone Encounter (Signed)
Approved.  

## 2016-10-30 NOTE — Progress Notes (Signed)
Patient ID: Lisa Ewing MRN: [id number], DOB: [birth year], 61 y.o. Date of Encounter: 10/30/2016, 12:02 PM    Chief Complaint:  Chief Complaint  Patient presents with  . cloudy urine    PHQ score 20  . pressure in bladder  . painful urination     HPI: 61 y.o. year old female presents with above.   On 10/22/16 she called and reported that she was having symptoms consistent with UTI but did not have transportation to get here for office visit.  Sent in Prescription for Cipro 500 mg twice a day 7 days. Instructed to come in for visit if indicated.  Today she reports that she took the Cipro as directed and took the last pill last night. However states that her symptoms never improved and are still persisting. Has had no back pain at the costophrenic angle region and has had no fevers or chills.  Is having urinary frequency, pressure, burning with urination, and her urine has been looking cloudy.   Also wanting refill on nicotine patch. Says that she is doing very well and has cut back her smoking a lot and plans to be completely off of them the next time she sees me.     Home Meds:   Outpatient Medications Prior to Visit  Medication Sig Dispense Refill  . ACCU-CHEK AVIVA PLUS test strip USE TO TEST BLOOD SUGAR ONCE DAILY 50 each 2  . ACCU-CHEK FASTCLIX LANCETS MISC 1 each by Other route daily. 100 each 5  . acyclovir (ZOVIRAX) 400 MG tablet TAKE 1 TABLET BY MOUTH EVERY DAY 30 tablet 1  . ADVAIR DISKUS 250-50 MCG/DOSE AEPB TAKE 1 INHALATION BY MOUTH TWICE DAILY.  RINSE MOUTH AFTER USE. 60 each 3  . atorvastatin (LIPITOR) 40 MG tablet TAKE 1 TABLET BY MOUTH EVERY DAY AT 6PM 30 tablet 4  . calcitRIOL (ROCALTROL) 0.25 MCG capsule TAKE ONE CAPSULE BY MOUTH ONCE DAILY (Patient taking differently: TAKE 0.56mcg CAPSULE BY MOUTH ONCE DAILY) 30 capsule 5  . calcium-vitamin D (OSCAL-500) 500-400 MG-UNIT per tablet Take 1 tablet by mouth 2 (two) times daily. 90 tablet 5  . cetirizine  (ZYRTEC) 10 MG tablet TAKE ONE TABLET BY MOUTH DAILY FOR ALLERGIES (Patient taking differently: TAKE 10mg  TABLET BY MOUTH DAILY FOR ALLERGIES) 30 tablet 10  . Cholecalciferol (VITAMIN D3) 2000 units capsule TAKE 1 CAPSULE BY MOUTH EVERY DAY 30 capsule 1  . Choline Fenofibrate (FENOFIBRIC ACID) 135 MG CPDR TAKE 1 CAPSULE BY MOUTH EVERY DAY (Patient taking differently: TAKE 135mg  CAPSULE BY MOUTH EVERY DAY) 30 capsule 4  . EPINEPHRINE 0.3 mg/0.3 mL IJ SOAJ injection INJECT 1 PEN INTRAMUSCULARLY AS NEEDED FOR ALLERGIC REACTION 2 Device 1  . escitalopram (LEXAPRO) 20 MG tablet TAKE 1 TABLET BY MOUTH EVERY DAY 30 tablet 1  . EVISTA 60 MG tablet Take 1 tablet (60 mg total) by mouth daily. 30 tablet 11  . furosemide (LASIX) 20 MG tablet TAKE ONE TABLET BY MOUTH EVERY DAY (Patient taking differently: TAKE 20mg  TABLET BY MOUTH EVERY DAY) 30 tablet 5  . gabapentin (NEURONTIN) 300 MG capsule TAKE 1 CAPSULE BY MOUTH THREE TIMES A DAY AND TAKE 2 CAPSULES EVERY NIGHT AT BEDTIME (Patient taking differently: TAKE 300-600mg  BY MOUTH Two TIMES A DAY ; take 300mg  in the morning and then TAKE 600mg  EVERY NIGHT AT BEDTIME) 150 capsule 2  . glucose blood (ACCU-CHEK AVIVA PLUS) test strip 1 each by Other route daily. Use as instructed 50 each 11  .  HYDROcodone-acetaminophen (NORCO) 10-325 MG tablet Take 1 tablet by mouth 3 (three) times daily. 90 tablet 0  . hyoscyamine (LEVSIN SL) 0.125 MG SL tablet PLACE 1 TABLET UNDER THE TONGUE EVERY 4 HOURS AS NEEDED (Patient taking differently: PLACE 1 TABLET UNDER THE TONGUE EVERY 4 HOURS AS NEEDED for cramping) 90 tablet 5  . ipratropium-albuterol (DUONEB) 0.5-2.5 (3) MG/3ML SOLN INHALE THE CONTENTS OF ONE VIAL VIA NEBULIZER BY MOUTH FOUR TIMES A DAY AS NEEDED FOR WHEEZING OR SHORTNESS OF BREATH 360 mL 4  . JANUVIA 100 MG tablet TAKE 1 TABLET BY MOUTH EVERY DAY 30 tablet 5  . levothyroxine (SYNTHROID, LEVOTHROID) 137 MCG tablet TAKE 1 TABLET BY MOUTH DAILY FOR BEFORE BREAKFAST 90  tablet 1  . metFORMIN (GLUCOPHAGE) 1000 MG tablet TAKE 1 TABLET BY MOUTH TWICE DAILY WITH A MEAL 60 tablet 2  . methocarbamol (ROBAXIN) 500 MG tablet TAKE ONE TABLET BY MOUTH THREE TIMES DAILY. 45 tablet 0  . methylPREDNISolone (MEDROL DOSEPAK) 4 MG TBPK tablet Use as directed 21 tablet 0  . montelukast (SINGULAIR) 10 MG tablet TAKE ONE TABLET BY MOUTH AT BEDTIME 90 tablet 2  . niacin (NIASPAN) 1000 MG CR tablet TAKE 2 TABLETS BY MOUTH EVERY NIGHT AT BEDTIME 180 tablet 0  . nicotine (NICODERM CQ) 21 mg/24hr patch Place 1 patch (21 mg total) onto the skin daily. 28 patch 0  . nystatin cream (MYCOSTATIN) APPLY 1 APPLICATION TO AFFECTED AREA(S) TWICE DAILY (Patient taking differently: APPLY 1 APPLICATION TO AFFECTED AREA(S) TWICE DAILY as needed for irritations) 30 g 5  . pantoprazole (PROTONIX) 40 MG tablet TAKE ONE TABLET BY MOUTH TWO TIMES DAILY BEFORE A MEAL. (Patient taking differently: TAKE 40mg  TABLET BY MOUTH TWO TIMES DAILY BEFORE A MEAL.) 60 tablet 4  . pioglitazone (ACTOS) 45 MG tablet TAKE 1 TABLET BY MOUTH EVERY DAY 30 tablet 1  . PROVENTIL HFA 108 (90 BASE) MCG/ACT inhaler USE 2 PUFFS EVERY 4 TO 6 HOURS AS NEEDED FOR SHORTNESS OF BREATH 6.7 g 3  . sucralfate (CARAFATE) 1 G tablet TAKE 1 TABLET BY MOUTH 4 TIMES A DAY WITH MEALS AND AT BEDTIME (Patient taking differently: TAKE 1g TABLET BY MOUTH 4 TIMES A DAY WITH MEALS AND AT BEDTIME as needed for stomach coating) 120 tablet 2  . traZODone (DESYREL) 100 MG tablet Take 1 tablet (100 mg total) by mouth at bedtime. 30 tablet 2  . triamcinolone (KENALOG) 0.025 % cream APPLY 1 APPLICATION TOPICALLY TO AFFECTED AREA(S) TWICE DAILY (Patient taking differently: APPLY 1 APPLICATION TOPICALLY TO AFFECTED AREA(S) TWICE DAILY as needed) 30 g 5  . VESICARE 5 MG tablet TAKE 1 TABLET BY MOUTH EVERY DAY 30 tablet 1  . VOLTAREN 1 % GEL APPLY 2 GRAMS TOPICALLY FOUR TIMES A DAY (Patient taking differently: APPLY 2 GRAMS TOPICALLY FOUR TIMES A DAY as need for  pain) 100 g 1  . ZETIA 10 MG tablet TAKE 1 TABLET BY MOUTH DAILY (Patient taking differently: TAKE 10mg  TABLET BY MOUTH in the evening) 30 tablet 10  . ciprofloxacin (CIPRO) 500 MG tablet Take 1 tablet (500 mg total) by mouth 2 (two) times daily. (Patient not taking: Reported on 10/30/2016) 14 tablet 0   No facility-administered medications prior to visit.     Allergies:  Allergies  Allergen Reactions  . Bee Venom Anaphylaxis  . Latex Shortness Of Breath  . Oxycodone-Acetaminophen Hives and Shortness Of Breath    Upset Stomach  . Penicillins Anaphylaxis    Throat swells Has  patient had a PCN reaction causing immediate rash, facial/tongue/throat swelling, SOB or lightheadedness with hypotension: yes Has patient had a PCN reaction causing severe rash involving mucus membranes or skin necrosis: no Has patient had a PCN reaction that required hospitalization- yes at hospital Has patient had a PCN reaction occurring within the last 10 years: no If all of the above answers are "NO", then may proceed with Cephalosporin use.   . Shellfish Allergy Anaphylaxis    Throat swells   . Codeine Nausea Only  . Metoclopramide Hcl     Doesn't recall type of reaction  . Other Swelling    Almonds. Bananas cause an asthma attack.   . Pregabalin Swelling  . Sulfonamide Derivatives     Tongue swells   . Adhesive [Tape] Rash  . Wellbutrin [Bupropion] Rash    Hives, red whelps      Review of Systems: See HPI for pertinent ROS. All other ROS negative.    Physical Exam: Blood pressure 128/70, pulse 94, temperature 97.9 F (36.6 C), temperature source Oral, resp. rate 18, SpO2 92 %., There is no height or weight on file to calculate BMI. General:  Obese WF in wheelchair. Appears in no acute distress. Neck: Supple. No thyromegaly. No lymphadenopathy. Lungs: Clear bilaterally to auscultation without wheezes, rales, or rhonchi. Breathing is unlabored. Heart: Regular rhythm. No murmurs, rubs, or  gallops. Abdomen: Soft,  non-distended with normoactive bowel sounds. No hepatomegaly. No rebound/guarding. No obvious abdominal masses.Mild tenderness with palpation ob suprapubic region.  Msk:  Strength and tone normal for age.No costophrenic angle tenderness with percussion bilaterally.  Extremities/Skin: Warm and dry. Neuro: Alert and oriented X 3. Moves all extremities spontaneously. Gait is normal. CNII-XII grossly in tact. Psych:  Responds to questions appropriately with a normal affect.   Results for orders placed or performed in visit on 10/30/16  Urinalysis, Routine w reflex microscopic  Result Value Ref Range   Color, Urine YELLOW YELLOW   APPearance CLOUDY (A) CLEAR   Specific Gravity, Urine 1.025 1.001 - 1.035   pH 6.5 5.0 - 8.0   Glucose, UA NEGATIVE NEGATIVE   Bilirubin Urine NEGATIVE NEGATIVE   Ketones, ur NEGATIVE NEGATIVE   Hgb urine dipstick TRACE (A) NEGATIVE   Protein, ur 1+ (A) NEGATIVE   Nitrite POSITIVE (A) NEGATIVE   Leukocytes, UA 3+ (A) NEGATIVE  Urine Microscopic  Result Value Ref Range   WBC, UA >60 (A) <=5 WBC/HPF   RBC / HPF 3-10 (A) <=2 RBC/HPF   Squamous Epithelial / LPF 0-5 <=5 HPF   Bacteria, UA MANY (A) NONE SEEN HPF   Crystals NONE SEEN NONE SEEN HPF   Casts NONE SEEN NONE SEEN LPF   Yeast FEW (A) NONE SEEN HPF     ASSESSMENT AND PLAN:  61 y.o. year old female with  1. Acute cystitis without hematuria She has allergy to sulfa so have to avoid Bactrim. Has allergy to penicillin.  She was just treated with Cipro and still has UTI. So will not use Cipro and also will not use Levaquin.  Reviewed her last urine culture which was 08/11/16 --  that infection was pansensitive. At this time will prescribe nitrofurantoin and she is to take this as directed. Also send urine culture to follow-up sensitivities - Urine culture - nitrofurantoin, macrocrystal-monohydrate, (MACROBID) 100 MG capsule; Take 1 capsule (100 mg total) by mouth 2 (two) times  daily.  Dispense: 14 capsule; Refill: 0  2. Burning with urination - Urinalysis, Routine w  reflex microscopic  3. Encounter for smoking cessation counseling Has been on nicotine patch and has doing well with this. Will decrease the dose on this next round of nicotine patch. - nicotine (NICODERM CQ - DOSED IN MG/24 HR) 7 mg/24hr patch; Place 1 patch (7 mg total) onto the skin daily.  Dispense: 28 patch; Refill: 0   Signed, [address] [place], [place], [facility] 10/30/2016 12:02 PM

## 2016-11-02 LAB — URINE CULTURE

## 2016-11-03 ENCOUNTER — Telehealth: Payer: Self-pay | Admitting: Registered Nurse

## 2016-11-03 NOTE — Telephone Encounter (Signed)
Placed a call to Ms. Owens Shark regarding X-ray, no answer. Left message to return the call.

## 2016-11-03 NOTE — Addendum Note (Signed)
Addended by: Olena Mater on: 11/03/2016 11:35 AM   Modules accepted: Orders

## 2016-11-05 ENCOUNTER — Telehealth: Payer: Self-pay | Admitting: Registered Nurse

## 2016-11-05 ENCOUNTER — Ambulatory Visit (HOSPITAL_COMMUNITY)
Admission: RE | Admit: 2016-11-05 | Discharge: 2016-11-05 | Disposition: A | Discharge status: Home / Self Care | Payer: Medicaid Other | Source: Ambulatory Visit | Attending: Physician Assistant | Admitting: Physician Assistant

## 2016-11-05 DIAGNOSIS — J440 Chronic obstructive pulmonary disease with acute lower respiratory infection: Secondary | ICD-10-CM

## 2016-11-05 MED ORDER — ALBUTEROL SULFATE (2.5 MG/3ML) 0.083% IN NEBU
2.5000 mg | INHALATION_SOLUTION | Freq: Once | RESPIRATORY_TRACT | Status: AC
  Administered 2016-11-05: 2.5 mg via RESPIRATORY_TRACT

## 2016-11-05 NOTE — Telephone Encounter (Signed)
Return Lisa Ewing Shark call, reviewed the X-ray, she verbalizes understanding.

## 2016-11-05 NOTE — Telephone Encounter (Signed)
Lisa Ewing returned Lisa Ewing's call.

## 2016-11-06 ENCOUNTER — Encounter: Payer: Self-pay | Admitting: Registered Nurse

## 2016-11-06 ENCOUNTER — Encounter: Payer: Medicaid Other | Attending: Physical Medicine and Rehabilitation | Admitting: Registered Nurse

## 2016-11-06 VITALS — BP 106/77 | HR 98

## 2016-11-06 DIAGNOSIS — G8929 Other chronic pain: Secondary | ICD-10-CM

## 2016-11-06 DIAGNOSIS — M75101 Unspecified rotator cuff tear or rupture of right shoulder, not specified as traumatic: Secondary | ICD-10-CM

## 2016-11-06 DIAGNOSIS — F17210 Nicotine dependence, cigarettes, uncomplicated: Secondary | ICD-10-CM | POA: Diagnosis not present

## 2016-11-06 DIAGNOSIS — J449 Chronic obstructive pulmonary disease, unspecified: Secondary | ICD-10-CM | POA: Diagnosis not present

## 2016-11-06 DIAGNOSIS — Z5181 Encounter for therapeutic drug level monitoring: Secondary | ICD-10-CM

## 2016-11-06 DIAGNOSIS — M5416 Radiculopathy, lumbar region: Secondary | ICD-10-CM | POA: Diagnosis not present

## 2016-11-06 DIAGNOSIS — M542 Cervicalgia: Secondary | ICD-10-CM

## 2016-11-06 DIAGNOSIS — M25512 Pain in left shoulder: Secondary | ICD-10-CM | POA: Insufficient documentation

## 2016-11-06 DIAGNOSIS — E119 Type 2 diabetes mellitus without complications: Secondary | ICD-10-CM | POA: Insufficient documentation

## 2016-11-06 DIAGNOSIS — M961 Postlaminectomy syndrome, not elsewhere classified: Secondary | ICD-10-CM | POA: Diagnosis not present

## 2016-11-06 DIAGNOSIS — K219 Gastro-esophageal reflux disease without esophagitis: Secondary | ICD-10-CM | POA: Insufficient documentation

## 2016-11-06 DIAGNOSIS — Z79899 Other long term (current) drug therapy: Secondary | ICD-10-CM | POA: Diagnosis not present

## 2016-11-06 DIAGNOSIS — M47816 Spondylosis without myelopathy or radiculopathy, lumbar region: Secondary | ICD-10-CM | POA: Diagnosis not present

## 2016-11-06 DIAGNOSIS — M5412 Radiculopathy, cervical region: Secondary | ICD-10-CM | POA: Diagnosis not present

## 2016-11-06 DIAGNOSIS — M546 Pain in thoracic spine: Secondary | ICD-10-CM | POA: Diagnosis not present

## 2016-11-06 DIAGNOSIS — M79604 Pain in right leg: Secondary | ICD-10-CM | POA: Insufficient documentation

## 2016-11-06 DIAGNOSIS — G894 Chronic pain syndrome: Secondary | ICD-10-CM

## 2016-11-06 DIAGNOSIS — M810 Age-related osteoporosis without current pathological fracture: Secondary | ICD-10-CM | POA: Diagnosis not present

## 2016-11-06 DIAGNOSIS — IMO0002 Reserved for concepts with insufficient information to code with codable children: Secondary | ICD-10-CM

## 2016-11-06 DIAGNOSIS — E785 Hyperlipidemia, unspecified: Secondary | ICD-10-CM | POA: Insufficient documentation

## 2016-11-06 DIAGNOSIS — M25511 Pain in right shoulder: Secondary | ICD-10-CM | POA: Diagnosis not present

## 2016-11-06 DIAGNOSIS — M7542 Impingement syndrome of left shoulder: Secondary | ICD-10-CM

## 2016-11-06 DIAGNOSIS — M25551 Pain in right hip: Secondary | ICD-10-CM | POA: Diagnosis not present

## 2016-11-06 DIAGNOSIS — M47814 Spondylosis without myelopathy or radiculopathy, thoracic region: Secondary | ICD-10-CM | POA: Insufficient documentation

## 2016-11-06 DIAGNOSIS — E039 Hypothyroidism, unspecified: Secondary | ICD-10-CM | POA: Insufficient documentation

## 2016-11-06 MED ORDER — HYDROCODONE-ACETAMINOPHEN 10-325 MG PO TABS: 1.0000 | ORAL_TABLET | Freq: Three times a day (TID) | ORAL | 0 refills | Status: DC

## 2016-11-06 NOTE — Patient Instructions (Signed)
Increase Gabapentin : Two capsules ( 600 mg)  Three times day , Morning, late aftenoon , Bedtime  Call me in a week   [phone]
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
## 2016-11-07 ENCOUNTER — Other Ambulatory Visit: Payer: Self-pay | Admitting: Family Medicine

## 2016-11-11 ENCOUNTER — Ambulatory Visit: Payer: Medicaid Other | Admitting: Gastroenterology

## 2016-11-12 LAB — PULMONARY FUNCTION TEST
DL/VA % PRED: 73 %
DL/VA: 3.68 ml/min/mmHg/L
DLCO COR % PRED: 45 %
DLCO COR: 12.05 ml/min/mmHg
DLCO unc % pred: 45 %
DLCO unc: 12.05 ml/min/mmHg
FEF 25-75 POST: 1.52 L/s
FEF 25-75 Pre: 1.46 L/sec
FEF2575-%CHANGE-POST: 3 %
FEF2575-%PRED-POST: 63 %
FEF2575-%Pred-Pre: 61 %
FEV1-%CHANGE-POST: 1 %
FEV1-%Pred-Post: 53 %
FEV1-%Pred-Pre: 52 %
FEV1-PRE: 1.4 L
FEV1-Post: 1.42 L
FEV1FVC-%Change-Post: -1 %
FEV1FVC-%PRED-PRE: 107 %
FEV6-%Change-Post: 1 %
FEV6-%PRED-POST: 51 %
FEV6-%Pred-Pre: 50 %
FEV6-Post: 1.71 L
FEV6-Pre: 1.68 L
FEV6FVC-%Pred-Post: 103 %
FEV6FVC-%Pred-Pre: 103 %
FVC-%Change-Post: 2 %
FVC-%Pred-Post: 49 %
FVC-%Pred-Pre: 48 %
FVC-PRE: 1.68 L
FVC-Post: 1.72 L
POST FEV1/FVC RATIO: 83 %
PRE FEV6/FVC RATIO: 100 %
Post FEV6/FVC ratio: 100 %
Pre FEV1/FVC ratio: 84 %
RV % PRED: 98 %
RV: 2.06 L
TLC % pred: 75 %
TLC: 4 L

## 2016-11-12 LAB — TOXASSURE SELECT,+ANTIDEPR,UR

## 2016-11-13 ENCOUNTER — Telehealth: Payer: Self-pay

## 2016-11-13 ENCOUNTER — Other Ambulatory Visit: Payer: Self-pay

## 2016-11-13 DIAGNOSIS — J441 Chronic obstructive pulmonary disease with (acute) exacerbation: Secondary | ICD-10-CM

## 2016-11-13 NOTE — Telephone Encounter (Signed)
Sorry, but she really needs to come back in so that we can have documentation. If she is having recurrent UTIs she may need to see a urologist. UTI treatment--maximum is usually 7 days. Young females are treated with just 3 day treatment and the maximum treatment is 7 days. Not appropriate to continue antibiotic for 14 days. If she is having Recurrnnt UTI, need documentation so if she needs to see Urology, they will have documentation.

## 2016-11-13 NOTE — Telephone Encounter (Signed)
Patient was seen in the office 2-22 for  UTI and was on Macrobid for 7 days. Patient states she has since  started back to experience burning as well as pressure when urinating. Patient states these are the only symptoms she is having at this time, no discharge  and no odor  Patient is asking if a refill can be called in for 14 days instead or will she need to come in for an office visit?

## 2016-11-13 NOTE — Telephone Encounter (Signed)
Spoke with patient and explained she will need to be seen and that we could not give antibiotics to last past 7 days as she requested.  Patient understands and will make appointment to be seen in the office on 3-9

## 2016-11-14 ENCOUNTER — Encounter: Payer: Self-pay | Admitting: Family Medicine

## 2016-11-14 ENCOUNTER — Ambulatory Visit (INDEPENDENT_AMBULATORY_CARE_PROVIDER_SITE_OTHER): Payer: Medicaid Other | Admitting: Family Medicine

## 2016-11-14 VITALS — BP 112/76 | HR 98 | Temp 98.2°F | Resp 22

## 2016-11-14 DIAGNOSIS — R319 Hematuria, unspecified: Secondary | ICD-10-CM | POA: Diagnosis not present

## 2016-11-14 DIAGNOSIS — Z720 Tobacco use: Secondary | ICD-10-CM

## 2016-11-14 DIAGNOSIS — N390 Urinary tract infection, site not specified: Secondary | ICD-10-CM | POA: Diagnosis not present

## 2016-11-14 LAB — URINALYSIS, MICROSCOPIC ONLY
CASTS: NONE SEEN [LPF]
Crystals: NONE SEEN [HPF]
Yeast: NONE SEEN [HPF]

## 2016-11-14 LAB — URINALYSIS, ROUTINE W REFLEX MICROSCOPIC
Bilirubin Urine: NEGATIVE
Glucose, UA: NEGATIVE
KETONES UR: NEGATIVE
NITRITE: NEGATIVE
Specific Gravity, Urine: 1.025 (ref 1.001–1.035)
pH: 5.5 (ref 5.0–8.0)

## 2016-11-14 MED ORDER — NITROFURANTOIN MONOHYD MACRO 100 MG PO CAPS: 100.0000 mg | ORAL_CAPSULE | Freq: Two times a day (BID) | ORAL | 0 refills | Status: DC

## 2016-11-14 NOTE — Patient Instructions (Addendum)
F/u AS NEEDED Referral to urology Use nystatin Use macrobid

## 2016-11-14 NOTE — Progress Notes (Signed)
   Subjective:    Patient ID: Lisa Ewing, female    DOB: 1956-07-13, 61 y.o.   MRN: [id number]  Patient presents for Dysuria (would like referral to Urology)   Pt here with recurrent UTI, last culture E coli  10/30/16 with multivel resistant antibiotics. Treated with macrobid improved The symptoms came back about a week later. She was followed by urology in the past secondary to recurrent urinary tract infections. She is a smoker. We have noted blood in her urine on multiple occasions. Denies any abdominal pain denies any fever or nausea or vomiting. She thinks that she keep skin infections that she has difficulty wiping because of chronic shoulder issues. She now has some type of device that assist her with wiping. Note she is wheelchair bound   Review Of Systems:  GEN- denies fatigue, fever, weight loss,weakness, recent illness HEENT- denies eye drainage, change in vision, nasal discharge, CVS- denies chest pain, palpitations RESP- denies SOB, cough, wheeze ABD- denies N/V, change in stools, abd pain GU- +dysuria, denies  hematuria, dribbling, incontinence MSK- denies joint pain, muscle aches, injury Neuro- denies headache, dizziness, syncope, seizure activity       Objective:    BP 112/76   Pulse 98   Temp 98.2 F (36.8 C) (Oral)   Resp (!) 22   LMP  (LMP Unknown)   SpO2 92%  GEN- NAD, alert and oriented x3,sitting in wheelchair HEENT- PERRL,EOMI, MMM,post oropharynx clear  CVS- RRR, no murmur RESP-CTAB ABD-NABS,soft,TTP suprapubic region, no rebound, no gaurding, no CVA tenderness,ND EXT- No edema Pulses- Radial  2+      Assessment & Plan:      Problem List Items Addressed This Visit    Urinary tract infection - Primary Recurrent UTI, discussed hygiene, she has anaphylaxis with PCN therefore since in outpatient setting will not try cephalasporin, E coli was resistant to multiple drug therefore I am left with macrobid for 10 days, increasing water, she also uses  pyridium Referral to urology, she is a smoker, recurrent infections may need cystocopy  Discussed tobacco cessation, pt willing to start cutting back   Relevant Medications   nitrofurantoin, macrocrystal-monohydrate, (MACROBID) 100 MG capsule   Other Relevant Orders   Urinalysis, Routine w reflex microscopic (Completed)   Urine culture   Ambulatory referral to Urology      Note: This dictation was prepared with Dragon dictation along with smaller phrase technology. Any transcriptional errors that result from this process are unintentional.

## 2016-11-17 LAB — URINE CULTURE

## 2016-11-18 ENCOUNTER — Telehealth: Payer: Self-pay | Admitting: *Deleted

## 2016-11-18 NOTE — Telephone Encounter (Signed)
Prior auth initiated with [place] [employer] for Hydrocodone acetaminophen 10/325 #90

## 2016-11-19 NOTE — Telephone Encounter (Signed)
Hydrocodone approved Prior Approval [id number]  Confirmation [id number]  Approved 11/18/16-05/17/17. Hassan Rowan notified.

## 2016-11-25 ENCOUNTER — Other Ambulatory Visit: Payer: Medicaid Other

## 2016-12-01 ENCOUNTER — Encounter: Payer: Medicaid Other | Admitting: Physician Assistant

## 2016-12-02 ENCOUNTER — Ambulatory Visit: Payer: Medicaid Other | Admitting: Physical Medicine & Rehabilitation

## 2016-12-05 ENCOUNTER — Other Ambulatory Visit: Payer: Self-pay | Admitting: Physical Medicine & Rehabilitation

## 2016-12-05 ENCOUNTER — Other Ambulatory Visit: Payer: Self-pay | Admitting: Physician Assistant

## 2016-12-08 ENCOUNTER — Ambulatory Visit
Admission: RE | Admit: 2016-12-08 | Discharge: 2016-12-08 | Disposition: A | Discharge status: Home / Self Care | Payer: Medicaid Other | Source: Ambulatory Visit | Attending: Registered Nurse | Admitting: Registered Nurse

## 2016-12-08 NOTE — Telephone Encounter (Signed)
Refill appropriate 

## 2016-12-11 ENCOUNTER — Ambulatory Visit (HOSPITAL_BASED_OUTPATIENT_CLINIC_OR_DEPARTMENT_OTHER): Payer: Medicaid Other | Admitting: Physical Medicine & Rehabilitation

## 2016-12-11 ENCOUNTER — Telehealth: Payer: Self-pay | Admitting: *Deleted

## 2016-12-11 ENCOUNTER — Encounter: Payer: Medicaid Other | Attending: Physical Medicine and Rehabilitation

## 2016-12-11 ENCOUNTER — Encounter: Payer: Self-pay | Admitting: Physical Medicine & Rehabilitation

## 2016-12-11 VITALS — BP 121/77 | HR 98 | Resp 14

## 2016-12-11 DIAGNOSIS — E039 Hypothyroidism, unspecified: Secondary | ICD-10-CM | POA: Diagnosis not present

## 2016-12-11 DIAGNOSIS — F17210 Nicotine dependence, cigarettes, uncomplicated: Secondary | ICD-10-CM | POA: Insufficient documentation

## 2016-12-11 DIAGNOSIS — M810 Age-related osteoporosis without current pathological fracture: Secondary | ICD-10-CM | POA: Insufficient documentation

## 2016-12-11 DIAGNOSIS — E785 Hyperlipidemia, unspecified: Secondary | ICD-10-CM | POA: Diagnosis not present

## 2016-12-11 DIAGNOSIS — E119 Type 2 diabetes mellitus without complications: Secondary | ICD-10-CM | POA: Diagnosis not present

## 2016-12-11 DIAGNOSIS — M79601 Pain in right arm: Secondary | ICD-10-CM

## 2016-12-11 DIAGNOSIS — M25512 Pain in left shoulder: Secondary | ICD-10-CM | POA: Insufficient documentation

## 2016-12-11 DIAGNOSIS — M79604 Pain in right leg: Secondary | ICD-10-CM | POA: Insufficient documentation

## 2016-12-11 DIAGNOSIS — M47814 Spondylosis without myelopathy or radiculopathy, thoracic region: Secondary | ICD-10-CM | POA: Insufficient documentation

## 2016-12-11 DIAGNOSIS — M47816 Spondylosis without myelopathy or radiculopathy, lumbar region: Secondary | ICD-10-CM | POA: Diagnosis not present

## 2016-12-11 DIAGNOSIS — J449 Chronic obstructive pulmonary disease, unspecified: Secondary | ICD-10-CM | POA: Diagnosis not present

## 2016-12-11 DIAGNOSIS — K219 Gastro-esophageal reflux disease without esophagitis: Secondary | ICD-10-CM | POA: Insufficient documentation

## 2016-12-11 DIAGNOSIS — G8929 Other chronic pain: Secondary | ICD-10-CM | POA: Diagnosis not present

## 2016-12-11 DIAGNOSIS — M25551 Pain in right hip: Secondary | ICD-10-CM | POA: Insufficient documentation

## 2016-12-11 MED ORDER — HYDROCODONE-ACETAMINOPHEN 10-325 MG PO TABS: 1.0000 | ORAL_TABLET | Freq: Three times a day (TID) | ORAL | 0 refills | Status: DC

## 2016-12-11 NOTE — Telephone Encounter (Signed)
Could be will see what next result is

## 2016-12-11 NOTE — Progress Notes (Signed)
EMG/NCV right upper extremity performed today. Some technical limitations due to morbid obesity with proximal motor conductions. No evidence median neuropathy or right cervical radiculopathy  X-ray of right shoulder ordered  MRI of cervical spine did not show any stenotic lesions to explain right shoulder pain

## 2016-12-11 NOTE — Patient Instructions (Addendum)
Your MRI did not show any sign of pinched nerve in the neck area. 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

## 2016-12-11 NOTE — Telephone Encounter (Signed)
-----   Message from Charlett Blake, MD sent at 12/11/2016  9:32 AM EDT ----- Notify pt she cannot drink with hydrocodone

## 2016-12-11 NOTE — Telephone Encounter (Signed)
There is not ETS with the ETG and Damon is a diabetic so this would most likely be related to her diabetic status--right?

## 2016-12-17 ENCOUNTER — Other Ambulatory Visit: Payer: Self-pay | Admitting: Physician Assistant

## 2016-12-17 ENCOUNTER — Ambulatory Visit (INDEPENDENT_AMBULATORY_CARE_PROVIDER_SITE_OTHER): Payer: Medicaid Other | Admitting: Physician Assistant

## 2016-12-17 VITALS — BP 118/78 | HR 93 | Temp 98.2°F | Resp 18

## 2016-12-17 DIAGNOSIS — M25511 Pain in right shoulder: Secondary | ICD-10-CM

## 2016-12-17 DIAGNOSIS — Z0000 Encounter for general adult medical examination without abnormal findings: Secondary | ICD-10-CM

## 2016-12-17 DIAGNOSIS — G8929 Other chronic pain: Secondary | ICD-10-CM | POA: Diagnosis not present

## 2016-12-17 DIAGNOSIS — E119 Type 2 diabetes mellitus without complications: Secondary | ICD-10-CM | POA: Diagnosis not present

## 2016-12-17 DIAGNOSIS — Z720 Tobacco use: Secondary | ICD-10-CM

## 2016-12-17 DIAGNOSIS — Z716 Tobacco abuse counseling: Secondary | ICD-10-CM | POA: Diagnosis not present

## 2016-12-17 DIAGNOSIS — Z1231 Encounter for screening mammogram for malignant neoplasm of breast: Secondary | ICD-10-CM

## 2016-12-17 LAB — CBC WITH DIFFERENTIAL/PLATELET
BASOS ABS: 0 {cells}/uL (ref 0–200)
Basophils Relative: 0 %
EOS PCT: 2 %
Eosinophils Absolute: 234 cells/uL (ref 15–500)
HCT: 41.9 % (ref 35.0–45.0)
HEMOGLOBIN: 12.8 g/dL (ref 12.0–15.0)
LYMPHS ABS: 3627 {cells}/uL (ref 850–3900)
Lymphocytes Relative: 31 %
MCH: 24.2 pg — AB (ref 27.0–33.0)
MCHC: 30.5 g/dL — ABNORMAL LOW (ref 32.0–36.0)
MCV: 79.1 fL — ABNORMAL LOW (ref 80.0–100.0)
MPV: 9.9 fL (ref 7.5–12.5)
Monocytes Absolute: 1053 cells/uL — ABNORMAL HIGH (ref 200–950)
Monocytes Relative: 9 %
NEUTROS ABS: 6786 {cells}/uL (ref 1500–7800)
Neutrophils Relative %: 58 %
Platelets: 378 10*3/uL (ref 140–400)
RBC: 5.3 MIL/uL — ABNORMAL HIGH (ref 3.80–5.10)
RDW: 17.9 % — ABNORMAL HIGH (ref 11.0–15.0)
WBC: 11.7 10*3/uL — ABNORMAL HIGH (ref 3.8–10.8)

## 2016-12-17 LAB — COMPLETE METABOLIC PANEL WITH GFR
ALBUMIN: 3.7 g/dL (ref 3.6–5.1)
ALT: 11 U/L (ref 6–29)
AST: 11 U/L (ref 10–35)
Alkaline Phosphatase: 52 U/L (ref 33–130)
BILIRUBIN TOTAL: 0.2 mg/dL (ref 0.2–1.2)
BUN: 19 mg/dL (ref 7–25)
CO2: 31 mmol/L (ref 20–31)
CREATININE: 1.01 mg/dL — AB (ref 0.50–0.99)
Calcium: 9.2 mg/dL (ref 8.6–10.4)
Chloride: 103 mmol/L (ref 98–110)
GFR, EST NON AFRICAN AMERICAN: 60 mL/min (ref >=60)
GFR, Est African American: 69 mL/min (ref >=60)
GLUCOSE: 100 mg/dL — AB (ref 70–99)
Potassium: 5.2 mmol/L (ref 3.5–5.3)
SODIUM: 141 mmol/L (ref 135–146)
TOTAL PROTEIN: 6.6 g/dL (ref 6.1–8.1)

## 2016-12-17 LAB — LIPID PANEL
Cholesterol: 133 mg/dL (ref <200)
HDL: 32 mg/dL — AB (ref >50)
LDL CALC: 46 mg/dL (ref <100)
Total CHOL/HDL Ratio: 4.2 Ratio (ref <5.0)
Triglycerides: 274 mg/dL — ABNORMAL HIGH (ref <150)
VLDL: 55 mg/dL — AB (ref <30)

## 2016-12-17 LAB — TSH: TSH: 1.05 m[IU]/L

## 2016-12-17 MED ORDER — NICOTINE 14 MG/24HR TD PT24: 14.0000 mg | MEDICATED_PATCH | Freq: Every day | TRANSDERMAL | 0 refills | Status: DC
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
## 2016-12-18 ENCOUNTER — Encounter: Payer: Self-pay | Admitting: Physical Medicine & Rehabilitation

## 2017-01-01 ENCOUNTER — Telehealth: Payer: Self-pay

## 2017-01-01 NOTE — Telephone Encounter (Signed)
Patient called today, states is getting ready to take long distance drive to see son graduate and is asking if we can refill / prescribe a muscle relaxer to her for the trip, please advise

## 2017-01-02 MED ORDER — METHOCARBAMOL 500 MG PO TABS: 500.0000 mg | ORAL_TABLET | Freq: Three times a day (TID) | ORAL | 0 refills | Status: DC

## 2017-01-02 NOTE — Telephone Encounter (Signed)
Dr. Kirsteins' pt 

## 2017-01-02 NOTE — Telephone Encounter (Signed)
My apologies, rerouting to correct doctor

## 2017-01-08 ENCOUNTER — Other Ambulatory Visit: Payer: Self-pay

## 2017-01-08 DIAGNOSIS — Z716 Tobacco abuse counseling: Secondary | ICD-10-CM

## 2017-01-08 MED ORDER — GLUCOSE BLOOD VI STRP: ORAL_STRIP | 2 refills | Status: DC

## 2017-01-08 MED ORDER — IPRATROPIUM-ALBUTEROL 0.5-2.5 (3) MG/3ML IN SOLN: RESPIRATORY_TRACT | 4 refills | Status: DC

## 2017-01-08 MED ORDER — EZETIMIBE 10 MG PO TABS: 10.0000 mg | ORAL_TABLET | Freq: Every day | ORAL | 10 refills | Status: DC

## 2017-01-08 MED ORDER — NICOTINE 14 MG/24HR TD PT24: 14.0000 mg | MEDICATED_PATCH | Freq: Every day | TRANSDERMAL | 0 refills | Status: DC

## 2017-01-08 MED ORDER — FENOFIBRIC ACID 135 MG PO CPDR: 1.0000 | DELAYED_RELEASE_CAPSULE | Freq: Every day | ORAL | 4 refills | Status: DC

## 2017-01-08 MED ORDER — LEVOTHYROXINE SODIUM 137 MCG PO TABS: ORAL_TABLET | ORAL | 1 refills | Status: DC

## 2017-01-08 MED ORDER — PIOGLITAZONE HCL 45 MG PO TABS: 45.0000 mg | ORAL_TABLET | Freq: Every day | ORAL | 1 refills | Status: DC

## 2017-01-08 MED ORDER — EVISTA 60 MG PO TABS: 60.0000 mg | ORAL_TABLET | Freq: Every day | ORAL | 11 refills | Status: DC

## 2017-01-08 MED ORDER — ACYCLOVIR 400 MG PO TABS: 400.0000 mg | ORAL_TABLET | Freq: Every day | ORAL | 0 refills | Status: DC

## 2017-01-08 MED ORDER — EPINEPHRINE 0.3 MG/0.3ML IJ SOAJ: INTRAMUSCULAR | 1 refills | Status: DC

## 2017-01-08 MED ORDER — ATORVASTATIN CALCIUM 40 MG PO TABS: ORAL_TABLET | ORAL | 4 refills | Status: DC

## 2017-01-08 MED ORDER — ACCU-CHEK FASTCLIX LANCETS MISC: 1.0000 | Freq: Every day | 5 refills | Status: DC

## 2017-01-08 MED ORDER — TRIAMCINOLONE ACETONIDE 0.025 % EX CREA: TOPICAL_CREAM | CUTANEOUS | 5 refills | Status: DC

## 2017-01-08 MED ORDER — FLUTICASONE-SALMETEROL 250-50 MCG/DOSE IN AEPB: INHALATION_SPRAY | RESPIRATORY_TRACT | 3 refills | Status: DC

## 2017-01-08 MED ORDER — ALBUTEROL SULFATE HFA 108 (90 BASE) MCG/ACT IN AERS: INHALATION_SPRAY | RESPIRATORY_TRACT | 3 refills | Status: DC

## 2017-01-08 MED ORDER — SUCRALFATE 1 G PO TABS: ORAL_TABLET | ORAL | 2 refills | Status: AC

## 2017-01-08 MED ORDER — VITAMIN D3 50 MCG (2000 UT) PO CAPS: 2000.0000 [IU] | ORAL_CAPSULE | Freq: Every day | ORAL | 0 refills | Status: DC

## 2017-01-08 MED ORDER — MONTELUKAST SODIUM 10 MG PO TABS: 10.0000 mg | ORAL_TABLET | Freq: Every day | ORAL | 2 refills | Status: DC

## 2017-01-08 MED ORDER — NIACIN ER (ANTIHYPERLIPIDEMIC) 1000 MG PO TBCR: 2000.0000 mg | EXTENDED_RELEASE_TABLET | Freq: Every day | ORAL | 0 refills | Status: DC

## 2017-01-08 MED ORDER — NYSTATIN 100000 UNIT/GM EX CREA: TOPICAL_CREAM | CUTANEOUS | 5 refills | Status: DC

## 2017-01-08 MED ORDER — FUROSEMIDE 20 MG PO TABS: 20.0000 mg | ORAL_TABLET | Freq: Every day | ORAL | 5 refills | Status: DC

## 2017-01-08 MED ORDER — METFORMIN HCL 1000 MG PO TABS: ORAL_TABLET | ORAL | 2 refills | Status: DC

## 2017-01-08 MED ORDER — SOLIFENACIN SUCCINATE 5 MG PO TABS: 5.0000 mg | ORAL_TABLET | Freq: Every day | ORAL | 1 refills | Status: DC

## 2017-01-08 MED ORDER — CETIRIZINE HCL 10 MG PO TABS: ORAL_TABLET | ORAL | 10 refills | Status: DC

## 2017-01-08 MED ORDER — SITAGLIPTIN PHOSPHATE 100 MG PO TABS: 100.0000 mg | ORAL_TABLET | Freq: Every day | ORAL | 5 refills | Status: DC

## 2017-01-08 NOTE — Telephone Encounter (Signed)
Patient called and is requesting that all of her medications be switched to [facility]. I explained to patient I would work on switching them over for her.

## 2017-01-13 ENCOUNTER — Ambulatory Visit: Payer: Medicaid Other | Admitting: Registered Nurse

## 2017-01-13 ENCOUNTER — Telehealth: Payer: Self-pay | Admitting: Registered Nurse

## 2017-01-13 MED ORDER — HYDROCODONE-ACETAMINOPHEN 10-325 MG PO TABS: 1.0000 | ORAL_TABLET | Freq: Three times a day (TID) | ORAL | 0 refills | Status: DC

## 2017-01-13 NOTE — Telephone Encounter (Signed)
[facility] was reviewed. Hydrocodone prescription was picked up on 12/19/2016.  Prescription printed.

## 2017-01-14 ENCOUNTER — Encounter: Payer: Medicaid Other | Attending: Physical Medicine and Rehabilitation | Admitting: Registered Nurse

## 2017-01-14 ENCOUNTER — Telehealth: Payer: Self-pay | Admitting: Registered Nurse

## 2017-01-14 ENCOUNTER — Encounter: Payer: Self-pay | Admitting: Registered Nurse

## 2017-01-14 VITALS — BP 124/83 | HR 95

## 2017-01-14 DIAGNOSIS — M47816 Spondylosis without myelopathy or radiculopathy, lumbar region: Secondary | ICD-10-CM | POA: Insufficient documentation

## 2017-01-14 DIAGNOSIS — M5416 Radiculopathy, lumbar region: Secondary | ICD-10-CM

## 2017-01-14 DIAGNOSIS — F17210 Nicotine dependence, cigarettes, uncomplicated: Secondary | ICD-10-CM | POA: Diagnosis not present

## 2017-01-14 DIAGNOSIS — E119 Type 2 diabetes mellitus without complications: Secondary | ICD-10-CM | POA: Diagnosis not present

## 2017-01-14 DIAGNOSIS — M25551 Pain in right hip: Secondary | ICD-10-CM | POA: Insufficient documentation

## 2017-01-14 DIAGNOSIS — E785 Hyperlipidemia, unspecified: Secondary | ICD-10-CM | POA: Diagnosis not present

## 2017-01-14 DIAGNOSIS — M961 Postlaminectomy syndrome, not elsewhere classified: Secondary | ICD-10-CM

## 2017-01-14 DIAGNOSIS — J449 Chronic obstructive pulmonary disease, unspecified: Secondary | ICD-10-CM | POA: Diagnosis not present

## 2017-01-14 DIAGNOSIS — M47814 Spondylosis without myelopathy or radiculopathy, thoracic region: Secondary | ICD-10-CM | POA: Insufficient documentation

## 2017-01-14 DIAGNOSIS — Z79899 Other long term (current) drug therapy: Secondary | ICD-10-CM | POA: Diagnosis not present

## 2017-01-14 DIAGNOSIS — M79604 Pain in right leg: Secondary | ICD-10-CM | POA: Insufficient documentation

## 2017-01-14 DIAGNOSIS — M7061 Trochanteric bursitis, right hip: Secondary | ICD-10-CM

## 2017-01-14 DIAGNOSIS — Z5181 Encounter for therapeutic drug level monitoring: Secondary | ICD-10-CM

## 2017-01-14 DIAGNOSIS — M546 Pain in thoracic spine: Secondary | ICD-10-CM

## 2017-01-14 DIAGNOSIS — G894 Chronic pain syndrome: Secondary | ICD-10-CM

## 2017-01-14 DIAGNOSIS — G8929 Other chronic pain: Secondary | ICD-10-CM

## 2017-01-14 DIAGNOSIS — K219 Gastro-esophageal reflux disease without esophagitis: Secondary | ICD-10-CM | POA: Insufficient documentation

## 2017-01-14 DIAGNOSIS — M25512 Pain in left shoulder: Secondary | ICD-10-CM | POA: Insufficient documentation

## 2017-01-14 DIAGNOSIS — M62830 Muscle spasm of back: Secondary | ICD-10-CM

## 2017-01-14 DIAGNOSIS — E039 Hypothyroidism, unspecified: Secondary | ICD-10-CM | POA: Diagnosis not present

## 2017-01-14 DIAGNOSIS — M810 Age-related osteoporosis without current pathological fracture: Secondary | ICD-10-CM | POA: Insufficient documentation

## 2017-01-14 MED ORDER — METHOCARBAMOL 500 MG PO TABS: 500.0000 mg | ORAL_TABLET | Freq: Two times a day (BID) | ORAL | 1 refills | Status: DC | PRN
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
## 2017-01-14 NOTE — Telephone Encounter (Signed)
On 01/13/2017 the [place] was reviewed no conflict was seen on the [place] with multiple prescribers.  Ms. Crafton has a signed narcotic contract with our office. If there were any discrepancies this would have been reported to her physician.

## 2017-01-16 ENCOUNTER — Ambulatory Visit: Payer: Medicaid Other | Admitting: Podiatry

## 2017-01-19 ENCOUNTER — Telehealth: Payer: Self-pay

## 2017-01-19 ENCOUNTER — Telehealth: Payer: Self-pay | Admitting: Internal Medicine

## 2017-01-19 MED ORDER — ESCITALOPRAM OXALATE 20 MG PO TABS: 20.0000 mg | ORAL_TABLET | Freq: Every day | ORAL | 1 refills | Status: DC

## 2017-01-19 MED ORDER — PANTOPRAZOLE SODIUM 40 MG PO TBEC: DELAYED_RELEASE_TABLET | ORAL | 3 refills | Status: DC

## 2017-01-19 NOTE — Telephone Encounter (Signed)
Routing to the refill box. 

## 2017-01-19 NOTE — Telephone Encounter (Signed)
Pt said that [facility] was closing for a few weeks and she needed her medication of Protonix sent in to [facility].

## 2017-01-19 NOTE — Telephone Encounter (Signed)
Refill appropriate 

## 2017-01-19 NOTE — Addendum Note (Signed)
Addended by: Annitta Needs on: 01/19/2017 11:07 AM   Modules accepted: Orders

## 2017-01-19 NOTE — Telephone Encounter (Signed)
Done

## 2017-01-20 ENCOUNTER — Encounter: Payer: Self-pay | Admitting: Orthopedic Surgery

## 2017-01-20 ENCOUNTER — Ambulatory Visit (INDEPENDENT_AMBULATORY_CARE_PROVIDER_SITE_OTHER): Payer: Medicaid Other | Admitting: Orthopedic Surgery

## 2017-01-20 VITALS — BP 105/61 | HR 108 | Temp 98.6°F

## 2017-01-20 DIAGNOSIS — M25511 Pain in right shoulder: Secondary | ICD-10-CM

## 2017-01-20 DIAGNOSIS — M75121 Complete rotator cuff tear or rupture of right shoulder, not specified as traumatic: Secondary | ICD-10-CM | POA: Diagnosis not present

## 2017-01-20 DIAGNOSIS — G8929 Other chronic pain: Secondary | ICD-10-CM
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
## 2017-01-21 ENCOUNTER — Ambulatory Visit (HOSPITAL_COMMUNITY): Payer: Medicaid Other

## 2017-01-23 ENCOUNTER — Ambulatory Visit: Payer: Medicaid Other | Admitting: Family Medicine

## 2017-01-26 ENCOUNTER — Ambulatory Visit (INDEPENDENT_AMBULATORY_CARE_PROVIDER_SITE_OTHER): Payer: Medicaid Other | Admitting: Physician Assistant

## 2017-01-26 ENCOUNTER — Encounter: Payer: Self-pay | Admitting: Physician Assistant

## 2017-01-26 VITALS — BP 118/82 | HR 97 | Temp 98.0°F | Resp 16

## 2017-01-26 DIAGNOSIS — N390 Urinary tract infection, site not specified: Secondary | ICD-10-CM | POA: Diagnosis not present

## 2017-01-26 DIAGNOSIS — R300 Dysuria: Secondary | ICD-10-CM

## 2017-01-26 LAB — URINALYSIS, MICROSCOPIC ONLY
CASTS: NONE SEEN [LPF]
Crystals: NONE SEEN [HPF]
YEAST: NONE SEEN [HPF]

## 2017-01-26 LAB — URINALYSIS, ROUTINE W REFLEX MICROSCOPIC
BILIRUBIN URINE: NEGATIVE
GLUCOSE, UA: NEGATIVE
Ketones, ur: NEGATIVE
Nitrite: POSITIVE — AB
PH: 6.5 (ref 5.0–8.0)
Specific Gravity, Urine: 1.02 (ref 1.001–1.035)

## 2017-01-26 MED ORDER — NITROFURANTOIN MONOHYD MACRO 100 MG PO CAPS: 100.0000 mg | ORAL_CAPSULE | Freq: Two times a day (BID) | ORAL | 0 refills | Status: DC

## 2017-01-26 NOTE — Progress Notes (Signed)
Patient ID: AVE SCHARNHORST MRN: [id number], DOB: July 12, 1956, 61 y.o. Date of Encounter: 01/26/2017, 12:24 PM    Chief Complaint:  Chief Complaint  Patient presents with  . pressure when urinating  . cloudy urine     HPI: 61 y.o. year old female presents with above.   She has been having frequent recurrent UTIs recently and states that she has appointment with the specialist scheduled for May 30.  She states that she is having recurrent symptoms that are similar to symptoms she has had with recent UTIs. She is having burning when she urinates and is feeling a lot of pressure and a lot of frequency and urgency. States that she has had no fevers or chills.     Home Meds:   Outpatient Medications Prior to Visit  Medication Sig Dispense Refill  . ACCU-CHEK FASTCLIX LANCETS MISC 1 each by Other route daily. 100 each 5  . acyclovir (ZOVIRAX) 400 MG tablet Take 1 tablet (400 mg total) by mouth daily. 30 tablet 0  . albuterol (PROVENTIL HFA) 108 (90 Base) MCG/ACT inhaler USE 2 PUFFS EVERY 4 TO 6 HOURS AS NEEDED FOR SHORTNESS OF BREATH 6.7 g 3  . atorvastatin (LIPITOR) 40 MG tablet TAKE 1 TABLET BY MOUTH EVERY DAY AT 6PM 30 tablet 4  . calcitRIOL (ROCALTROL) 0.25 MCG capsule TAKE ONE CAPSULE BY MOUTH ONCE DAILY (Patient taking differently: TAKE 0.60mcg CAPSULE BY MOUTH ONCE DAILY) 30 capsule 5  . cetirizine (ZYRTEC) 10 MG tablet TAKE ONE TABLET BY MOUTH DAILY FOR ALLERGIES 30 tablet 10  . Cholecalciferol (VITAMIN D3) 2000 units capsule Take 1 capsule (2,000 Units total) by mouth daily. 30 capsule 0  . Choline Fenofibrate (FENOFIBRIC ACID) 135 MG CPDR Take 1 tablet by mouth daily. 30 capsule 4  . EPINEPHrine 0.3 mg/0.3 mL IJ SOAJ injection INJECT 1 PEN INTRAMUSCULARLY AS NEEDED FOR ALLERGIC REACTION 2 Device 1  . escitalopram (LEXAPRO) 20 MG tablet Take 1 tablet (20 mg total) by mouth daily. 30 tablet 1  . EVISTA 60 MG tablet Take 1 tablet (60 mg total) by mouth daily. 30 tablet 11  .  ezetimibe (ZETIA) 10 MG tablet Take 1 tablet (10 mg total) by mouth daily. 30 tablet 10  . Fluticasone-Salmeterol (ADVAIR DISKUS) 250-50 MCG/DOSE AEPB TAKE 1 INHALATION BY MOUTH TWICE DAILY.  RINSE MOUTH AFTER USE. 60 each 3  . furosemide (LASIX) 20 MG tablet Take 1 tablet (20 mg total) by mouth daily. 30 tablet 5  . gabapentin (NEURONTIN) 300 MG capsule TAKE 1 CAPSULE BY MOUTH THREE TIMES A DAY AND TAKE 2 CAPSULES EVERY NIGHT AT BEDTIME (Patient taking differently: take (2) caps PO TID) 150 capsule 2  . glucose blood (ACCU-CHEK AVIVA PLUS) test strip 1 each by Other route daily. Use as instructed 50 each 11  . glucose blood (ACCU-CHEK AVIVA PLUS) test strip USE TO TEST BLOOD SUGAR ONCE DAILY 50 each 2  . HYDROcodone-acetaminophen (NORCO) 10-325 MG tablet Take 1 tablet by mouth 3 (three) times daily. 90 tablet 0  . hyoscyamine (LEVSIN SL) 0.125 MG SL tablet PLACE 1 TABLET UNDER THE TONGUE EVERY 4 HOURS AS NEEDED (Patient taking differently: PLACE 1 TABLET UNDER THE TONGUE EVERY 4 HOURS AS NEEDED for cramping) 90 tablet 5  . ipratropium-albuterol (DUONEB) 0.5-2.5 (3) MG/3ML SOLN INHALE THE CONTENTS OF ONE VIAL VIA NEBULIZER BY MOUTH FOUR TIMES A DAY AS NEEDED FOR WHEEZING OR SHORTNESS OF BREATH 360 mL 4  . levothyroxine (SYNTHROID, LEVOTHROID) 137 MCG  tablet TAKE 1 TABLET BY MOUTH DAILY FOR BEFORE BREAKFAST 90 tablet 1  . metFORMIN (GLUCOPHAGE) 1000 MG tablet TAKE 1 TABLET BY MOUTH TWICE DAILY WITH A MEAL 60 tablet 2  . methocarbamol (ROBAXIN) 500 MG tablet Take 1 tablet (500 mg total) by mouth 2 (two) times daily as needed for muscle spasms. 40 tablet 1  . montelukast (SINGULAIR) 10 MG tablet Take 1 tablet (10 mg total) by mouth at bedtime. 90 tablet 2  . niacin (NIASPAN) 1000 MG CR tablet Take 2 tablets (2,000 mg total) by mouth at bedtime. 180 tablet 0  . nicotine (NICODERM CQ - DOSED IN MG/24 HOURS) 14 mg/24hr patch Place 1 patch (14 mg total) onto the skin daily. 28 patch 0  . nicotine (NICODERM  CQ - DOSED IN MG/24 HR) 7 mg/24hr patch Place 1 patch (7 mg total) onto the skin daily. 28 patch 0  . nicotine (NICODERM CQ) 21 mg/24hr patch Place 1 patch (21 mg total) onto the skin daily. 28 patch 0  . nitrofurantoin, macrocrystal-monohydrate, (MACROBID) 100 MG capsule Take 1 capsule (100 mg total) by mouth 2 (two) times daily. 20 capsule 0  . nystatin cream (MYCOSTATIN) APPLY 1 APPLICATION TO AFFECTED AREA(S) TWICE DAILY as needed for irritations 30 g 5  . pantoprazole (PROTONIX) 40 MG tablet TAKE ONE TABLET BY MOUTH TWO TIMES DAILY BEFORE A MEAL. 180 tablet 3  . pioglitazone (ACTOS) 45 MG tablet Take 1 tablet (45 mg total) by mouth daily. 30 tablet 1  . sitaGLIPtin (JANUVIA) 100 MG tablet Take 1 tablet (100 mg total) by mouth daily. 30 tablet 5  . solifenacin (VESICARE) 5 MG tablet Take 1 tablet (5 mg total) by mouth daily. 30 tablet 1  . sucralfate (CARAFATE) 1 g tablet TAKE 1g TABLET BY MOUTH 4 TIMES A DAY WITH MEALS AND AT BEDTIME as needed for stomach coating 120 tablet 2  . traZODone (DESYREL) 100 MG tablet Take 1 tablet (100 mg total) by mouth at bedtime. 30 tablet 2  . triamcinolone (KENALOG) 0.025 % cream APPLY 1 APPLICATION TOPICALLY TO AFFECTED AREA(S) TWICE DAILY as needed 30 g 5  . VOLTAREN 1 % GEL APPLY 2 GRAMS TOPICALLY FOUR TIMES A DAY 100 g 1   No facility-administered medications prior to visit.     Allergies:  Allergies  Allergen Reactions  . Bee Venom Anaphylaxis  . Latex Shortness Of Breath  . Oxycodone-Acetaminophen Hives and Shortness Of Breath    Upset Stomach  . Penicillins Anaphylaxis    Throat swells Has patient had a PCN reaction causing immediate rash, facial/tongue/throat swelling, SOB or lightheadedness with hypotension: yes Has patient had a PCN reaction causing severe rash involving mucus membranes or skin necrosis: no Has patient had a PCN reaction that required hospitalization- yes at hospital Has patient had a PCN reaction occurring within the last  10 years: no If all of the above answers are "NO", then may proceed with Cephalosporin use.   . Shellfish Allergy Anaphylaxis    Throat swells   . Codeine Nausea Only  . Metoclopramide Hcl     Doesn't recall type of reaction  . Other Swelling    Almonds. Bananas cause an asthma attack.   . Pregabalin Swelling  . Sulfonamide Derivatives     Tongue swells   . Adhesive [Tape] Rash  . Wellbutrin [Bupropion] Rash    Hives, red whelps      Review of Systems: See HPI for pertinent ROS. All other ROS negative.  Physical Exam: Blood pressure 118/82, pulse 97, temperature 98 F (36.7 C), temperature source Oral, resp. rate 16, SpO2 94 %., There is no height or weight on file to calculate BMI. General:  Obese WF in wheelchair. Appears in no acute distress. Neck: Supple. No thyromegaly. No lymphadenopathy. Lungs: Clear bilaterally to auscultation without wheezes, rales, or rhonchi. Breathing is unlabored. Heart: Regular rhythm. No murmurs, rubs, or gallops. Abdomen: She is in wheelchair so cannot do complete abdominal exam. Reports some tenderness with palpation of mid low abdomen. No other areas of tenderness.  Msk:  She is in wheelchair. She has chronic back pain but has no costophrenic angle tenderness with percussion bilaterally. Extremities/Skin: Warm and dry.  Neuro: Alert and oriented X 3. Moves all extremities spontaneously. Gait is normal. CNII-XII grossly in tact. Psych:  Responds to questions appropriately with a normal affect.   Results for orders placed or performed in visit on 01/26/17  Urinalysis, Routine w reflex microscopic  Result Value Ref Range   Color, Urine YELLOW YELLOW   APPearance CLOUDY (A) CLEAR   Specific Gravity, Urine 1.020 1.001 - 1.035   pH 6.5 5.0 - 8.0   Glucose, UA NEGATIVE NEGATIVE   Bilirubin Urine NEGATIVE NEGATIVE   Ketones, ur NEGATIVE NEGATIVE   Hgb urine dipstick TRACE (A) NEGATIVE   Protein, ur 1+ (A) NEGATIVE   Nitrite POSITIVE (A)  NEGATIVE   Leukocytes, UA 2+ (A) NEGATIVE  Urine Microscopic  Result Value Ref Range   WBC, UA PACKED (A) <=5 WBC/HPF   RBC / HPF 3-10 (A) <=2 RBC/HPF   Squamous Epithelial / LPF 6-10 (A) <=5 HPF   Bacteria, UA MANY (A) NONE SEEN HPF   Crystals NONE SEEN NONE SEEN HPF   Casts NONE SEEN NONE SEEN LPF   Yeast NONE SEEN NONE SEEN HPF     ASSESSMENT AND PLAN:  61 y.o. year old female with  1. Urinary tract infection without hematuria, site unspecified I revewed her most recent urine cultures from 11/14/16 and 10/30/16.  These both show a lot of antibiotic resistance including resistance to Cipro.  Both of them show sensitivity to Macrobid. Therefore will treat this infection with Macrobid. Will send culture on this urine as well for follow-up. She is to start the [place] immediately take as directed and complete all of it. Will follow-up urine culture result. Make sure to follow-up with urology appointment May 30. - Urine culture - nitrofurantoin, macrocrystal-monohydrate, (MACROBID) 100 MG capsule; Take 1 capsule (100 mg total) by mouth 2 (two) times daily.  Dispense: 20 capsule; Refill: 0  2. Dysuria - Urinalysis, Routine w reflex microscopic   Signed, [address] [place], [place], [facility] 01/26/2017 12:24 PM

## 2017-01-28 ENCOUNTER — Ambulatory Visit (HOSPITAL_COMMUNITY): Payer: Medicaid Other

## 2017-01-28 LAB — URINE CULTURE

## 2017-01-29 ENCOUNTER — Ambulatory Visit (HOSPITAL_COMMUNITY): Payer: Medicaid Other

## 2017-02-04 ENCOUNTER — Ambulatory Visit: Payer: Medicaid Other | Admitting: Urology

## 2017-02-06 ENCOUNTER — Ambulatory Visit: Payer: Medicaid Other | Admitting: Orthopedic Surgery

## 2017-02-10 ENCOUNTER — Ambulatory Visit (HOSPITAL_COMMUNITY): Payer: Medicaid Other

## 2017-02-11 ENCOUNTER — Encounter: Payer: Medicaid Other | Attending: Physical Medicine and Rehabilitation | Admitting: Registered Nurse

## 2017-02-11 ENCOUNTER — Encounter: Payer: Self-pay | Admitting: Registered Nurse

## 2017-02-11 VITALS — BP 122/75 | HR 100

## 2017-02-11 DIAGNOSIS — M25512 Pain in left shoulder: Secondary | ICD-10-CM | POA: Diagnosis not present

## 2017-02-11 DIAGNOSIS — M961 Postlaminectomy syndrome, not elsewhere classified: Secondary | ICD-10-CM | POA: Diagnosis not present

## 2017-02-11 DIAGNOSIS — J449 Chronic obstructive pulmonary disease, unspecified: Secondary | ICD-10-CM | POA: Diagnosis not present

## 2017-02-11 DIAGNOSIS — M7061 Trochanteric bursitis, right hip: Secondary | ICD-10-CM

## 2017-02-11 DIAGNOSIS — M47814 Spondylosis without myelopathy or radiculopathy, thoracic region: Secondary | ICD-10-CM | POA: Diagnosis not present

## 2017-02-11 DIAGNOSIS — E039 Hypothyroidism, unspecified: Secondary | ICD-10-CM | POA: Insufficient documentation

## 2017-02-11 DIAGNOSIS — M7062 Trochanteric bursitis, left hip: Secondary | ICD-10-CM

## 2017-02-11 DIAGNOSIS — Z79899 Other long term (current) drug therapy: Secondary | ICD-10-CM

## 2017-02-11 DIAGNOSIS — M47816 Spondylosis without myelopathy or radiculopathy, lumbar region: Secondary | ICD-10-CM | POA: Diagnosis not present

## 2017-02-11 DIAGNOSIS — E785 Hyperlipidemia, unspecified: Secondary | ICD-10-CM | POA: Diagnosis not present

## 2017-02-11 DIAGNOSIS — M75101 Unspecified rotator cuff tear or rupture of right shoulder, not specified as traumatic: Secondary | ICD-10-CM | POA: Diagnosis not present

## 2017-02-11 DIAGNOSIS — F3289 Other specified depressive episodes: Secondary | ICD-10-CM | POA: Diagnosis not present

## 2017-02-11 DIAGNOSIS — M79604 Pain in right leg: Secondary | ICD-10-CM | POA: Diagnosis not present

## 2017-02-11 DIAGNOSIS — F17210 Nicotine dependence, cigarettes, uncomplicated: Secondary | ICD-10-CM | POA: Diagnosis not present

## 2017-02-11 DIAGNOSIS — K219 Gastro-esophageal reflux disease without esophagitis: Secondary | ICD-10-CM | POA: Diagnosis not present

## 2017-02-11 DIAGNOSIS — M5416 Radiculopathy, lumbar region: Secondary | ICD-10-CM | POA: Diagnosis not present

## 2017-02-11 DIAGNOSIS — M810 Age-related osteoporosis without current pathological fracture: Secondary | ICD-10-CM | POA: Diagnosis not present

## 2017-02-11 DIAGNOSIS — M546 Pain in thoracic spine: Secondary | ICD-10-CM

## 2017-02-11 DIAGNOSIS — M25551 Pain in right hip: Secondary | ICD-10-CM | POA: Diagnosis not present

## 2017-02-11 DIAGNOSIS — E119 Type 2 diabetes mellitus without complications: Secondary | ICD-10-CM | POA: Diagnosis not present

## 2017-02-11 DIAGNOSIS — G8929 Other chronic pain: Secondary | ICD-10-CM | POA: Insufficient documentation

## 2017-02-11 DIAGNOSIS — IMO0002 Reserved for concepts with insufficient information to code with codable children: Secondary | ICD-10-CM

## 2017-02-11 DIAGNOSIS — M62830 Muscle spasm of back: Secondary | ICD-10-CM

## 2017-02-11 DIAGNOSIS — G894 Chronic pain syndrome: Secondary | ICD-10-CM

## 2017-02-11 DIAGNOSIS — Z5181 Encounter for therapeutic drug level monitoring: Secondary | ICD-10-CM

## 2017-02-11 MED ORDER — HYDROCODONE-ACETAMINOPHEN 10-325 MG PO TABS: 1.0000 | ORAL_TABLET | Freq: Three times a day (TID) | ORAL | 0 refills | Status: DC

## 2017-02-11 MED ORDER — DICLOFENAC SODIUM 1 % TD GEL: TRANSDERMAL | 3 refills | Status: DC
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
## 2017-02-13 ENCOUNTER — Telehealth: Payer: Self-pay | Admitting: Physician Assistant

## 2017-02-13 ENCOUNTER — Ambulatory Visit (HOSPITAL_COMMUNITY)
Admission: RE | Admit: 2017-02-13 | Discharge: 2017-02-13 | Disposition: A | Discharge status: Home / Self Care | Payer: Medicaid Other | Source: Ambulatory Visit | Attending: Orthopedic Surgery | Admitting: Orthopedic Surgery

## 2017-02-13 DIAGNOSIS — M7551 Bursitis of right shoulder: Secondary | ICD-10-CM | POA: Insufficient documentation

## 2017-02-13 DIAGNOSIS — M25511 Pain in right shoulder: Secondary | ICD-10-CM | POA: Diagnosis not present

## 2017-02-13 DIAGNOSIS — M75121 Complete rotator cuff tear or rupture of right shoulder, not specified as traumatic: Secondary | ICD-10-CM | POA: Insufficient documentation

## 2017-02-13 DIAGNOSIS — G8929 Other chronic pain: Secondary | ICD-10-CM | POA: Diagnosis present

## 2017-02-13 DIAGNOSIS — M19011 Primary osteoarthritis, right shoulder: Secondary | ICD-10-CM | POA: Insufficient documentation

## 2017-02-13 NOTE — Telephone Encounter (Signed)
I spoke with pt and informed her I will check into ordering her another wheel and once I have done that then I will call her bck to confirm request has been processed. I informed pt it would be Monday before I could get to it because it was the end of the day
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
## 2017-02-16 ENCOUNTER — Ambulatory Visit: Payer: Medicaid Other | Admitting: Orthopedic Surgery

## 2017-02-16 NOTE — Telephone Encounter (Signed)
Spoke with pt and informed her I called [employer] and they will not svc the wheelchair if she did not get it from them.  I spoke with [employer] where pt got wheelchair from [place] informed me that they are working on pt claim and that they have submitted info to ins for approval to fix the wheel.    I called pt and she is aware of what is going on

## 2017-02-18 ENCOUNTER — Other Ambulatory Visit: Payer: Self-pay | Admitting: Physician Assistant

## 2017-02-18 ENCOUNTER — Ambulatory Visit (HOSPITAL_COMMUNITY): Payer: Medicaid Other

## 2017-02-18 NOTE — Telephone Encounter (Signed)
Refill appropriate 

## 2017-02-20 ENCOUNTER — Other Ambulatory Visit: Payer: Self-pay | Admitting: Physical Medicine & Rehabilitation

## 2017-02-20 NOTE — Telephone Encounter (Signed)
We had to reschedule patient's appointment on 7/10 until 7/23.  Patient will run out of medication by 7/14.  Please call patient to let her know what we can do about medications.  Lisa Ewing does not have any openings as well that week of 7/9-7/14.

## 2017-02-23 ENCOUNTER — Ambulatory Visit (INDEPENDENT_AMBULATORY_CARE_PROVIDER_SITE_OTHER): Payer: Medicaid Other | Admitting: Orthopedic Surgery

## 2017-02-23 ENCOUNTER — Encounter: Payer: Self-pay | Admitting: Orthopedic Surgery

## 2017-02-23 DIAGNOSIS — M75121 Complete rotator cuff tear or rupture of right shoulder, not specified as traumatic: Secondary | ICD-10-CM

## 2017-02-23 DIAGNOSIS — M25511 Pain in right shoulder: Secondary | ICD-10-CM | POA: Diagnosis not present

## 2017-02-23 DIAGNOSIS — G8929 Other chronic pain: Secondary | ICD-10-CM | POA: Diagnosis not present

## 2017-02-23 NOTE — Progress Notes (Signed)
Chief Complaint  Patient presents with  . Follow-up    MRI results of right shoulder.    FOLLOW UP APPT .  Persistent right shoulder pain  The patient is on Medicaid patient she is only eligible for 3 therapy visits. It is unlikely that this will improve the situation in her right shoulder  She also has difficulty in that she doesn't ambulate except in a wheelchair. She has COPD still smokes and is not a surgical candidate at our hospital. She will need to be referred if she wants to have surgery  She has chronic discuss it with her daughter to determine if she wants to proceed and if she does then we will make a referral  MRI IMPRESSION: Motion degraded examination demonstrating rotator cuff tendinopathy with a 0.5 cm from front to back full-thickness tear of the mid to posterior supraspinatus. There is 1-2 cm of retraction. No atrophy.   Moderate acromioclavicular osteoarthritis. Type 1 acromion with subacromial spurring noted.   Mild to moderate glenohumeral osteoarthritis.   Small volume of subacromial/subdeltoid fluid consistent with bursitis.     Electronically Signed   By: Inge Rise M.D.   On: 02/13/2017 15:11   C SPINE:  IMPRESSION: 1. Severe left facet arthritis at C3-4 with inflammation in around the joint. 2. Slight degenerative disc disease at C4-5 and to a slightly greater degree at C6-7 without neural impingement at the other levels     Electronically Signed   By: Lorriane Shire M.D.   On: 12/08/2016 13:35

## 2017-02-23 NOTE — Telephone Encounter (Signed)
LVM stating to Mrs. Boutelle to give our office a call 1 week an advance before her medication runs out. Also stated she must keep her 7/23 appointment with Dr. Letta Pate.

## 2017-02-24 ENCOUNTER — Telehealth: Payer: Self-pay | Admitting: Orthopedic Surgery

## 2017-02-24 ENCOUNTER — Other Ambulatory Visit: Payer: Self-pay | Admitting: Physician Assistant

## 2017-02-24 NOTE — Addendum Note (Signed)
Addended by: Baldomero Lamy B on: 02/24/2017 04:47 PM   Modules accepted: Orders

## 2017-02-24 NOTE — Telephone Encounter (Signed)
Refill appropriate 

## 2017-02-24 NOTE — Telephone Encounter (Signed)
Referral sent to Dr Marlou Sa as discussed

## 2017-02-24 NOTE — Telephone Encounter (Signed)
Patient called, states would like to move forward with scheduling surgery. States aware surgery would be in [place].  Please advise patient. (?Referral)

## 2017-02-25 ENCOUNTER — Ambulatory Visit: Payer: Medicaid Other | Admitting: Podiatry

## 2017-03-03 ENCOUNTER — Telehealth: Payer: Self-pay

## 2017-03-03 NOTE — Telephone Encounter (Signed)
A request came through for repair with pt [employer] wheel. Provider has denied the request. [employer] has been notified

## 2017-03-05 ENCOUNTER — Ambulatory Visit (INDEPENDENT_AMBULATORY_CARE_PROVIDER_SITE_OTHER): Payer: Medicaid Other | Admitting: Orthopedic Surgery

## 2017-03-09 ENCOUNTER — Emergency Department (HOSPITAL_COMMUNITY)
Admission: EM | Admit: 2017-03-09 | Discharge: 2017-03-09 | Disposition: A | Discharge status: Home / Self Care | Payer: Medicaid Other | Attending: Emergency Medicine | Admitting: Emergency Medicine

## 2017-03-09 ENCOUNTER — Emergency Department (HOSPITAL_COMMUNITY): Payer: Medicaid Other

## 2017-03-09 ENCOUNTER — Encounter (HOSPITAL_COMMUNITY): Payer: Self-pay

## 2017-03-09 ENCOUNTER — Telehealth: Payer: Self-pay | Admitting: Physical Medicine & Rehabilitation

## 2017-03-09 DIAGNOSIS — Z7951 Long term (current) use of inhaled steroids: Secondary | ICD-10-CM | POA: Insufficient documentation

## 2017-03-09 DIAGNOSIS — Z9889 Other specified postprocedural states: Secondary | ICD-10-CM | POA: Insufficient documentation

## 2017-03-09 DIAGNOSIS — Z79818 Long term (current) use of other agents affecting estrogen receptors and estrogen levels: Secondary | ICD-10-CM | POA: Diagnosis not present

## 2017-03-09 DIAGNOSIS — Z7984 Long term (current) use of oral hypoglycemic drugs: Secondary | ICD-10-CM | POA: Insufficient documentation

## 2017-03-09 DIAGNOSIS — Y999 Unspecified external cause status: Secondary | ICD-10-CM | POA: Insufficient documentation

## 2017-03-09 DIAGNOSIS — W19XXXA Unspecified fall, initial encounter: Secondary | ICD-10-CM | POA: Diagnosis not present

## 2017-03-09 DIAGNOSIS — Y929 Unspecified place or not applicable: Secondary | ICD-10-CM | POA: Insufficient documentation

## 2017-03-09 DIAGNOSIS — E039 Hypothyroidism, unspecified: Secondary | ICD-10-CM | POA: Insufficient documentation

## 2017-03-09 DIAGNOSIS — Y939 Activity, unspecified: Secondary | ICD-10-CM | POA: Insufficient documentation

## 2017-03-09 DIAGNOSIS — S39012A Strain of muscle, fascia and tendon of lower back, initial encounter: Secondary | ICD-10-CM | POA: Insufficient documentation

## 2017-03-09 DIAGNOSIS — F17210 Nicotine dependence, cigarettes, uncomplicated: Secondary | ICD-10-CM | POA: Insufficient documentation

## 2017-03-09 DIAGNOSIS — E119 Type 2 diabetes mellitus without complications: Secondary | ICD-10-CM | POA: Diagnosis not present

## 2017-03-09 DIAGNOSIS — Z91040 Latex allergy status: Secondary | ICD-10-CM | POA: Insufficient documentation

## 2017-03-09 DIAGNOSIS — G8929 Other chronic pain: Secondary | ICD-10-CM | POA: Diagnosis not present

## 2017-03-09 DIAGNOSIS — Z7952 Long term (current) use of systemic steroids: Secondary | ICD-10-CM | POA: Insufficient documentation

## 2017-03-09 DIAGNOSIS — M5441 Lumbago with sciatica, right side: Secondary | ICD-10-CM | POA: Diagnosis not present

## 2017-03-09 DIAGNOSIS — S3992XA Unspecified injury of lower back, initial encounter: Secondary | ICD-10-CM | POA: Diagnosis present

## 2017-03-09 DIAGNOSIS — J449 Chronic obstructive pulmonary disease, unspecified: Secondary | ICD-10-CM | POA: Diagnosis not present

## 2017-03-09 MED ORDER — HYDROMORPHONE HCL 2 MG/ML IJ SOLN
2.0000 mg | Freq: Once | INTRAMUSCULAR | Status: AC
  Administered 2017-03-09: 2 mg via INTRAMUSCULAR
  Filled 2017-03-09: qty 1

## 2017-03-09 NOTE — ED Notes (Signed)
Pt's O2 sat fell to 79% on RA, placed on 3L [place] at this time and pt rose to 91%.

## 2017-03-09 NOTE — ED Notes (Signed)
Patient transported to CT 
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
## 2017-03-09 NOTE — Discharge Instructions (Signed)
No evidence of any acute fractures to the back. Continue pain medicine at home. Follow-up with your doctors.

## 2017-03-09 NOTE — ED Notes (Addendum)
Transportation notified. States it will be after 6 before they can come due to shift change

## 2017-03-09 NOTE — Telephone Encounter (Signed)
Pt stated she needs her medication refilled and will pick them up next week. She stated she will run out on the 14th. Pt stated needs a week in advance in order for transportation to come pick up medications.

## 2017-03-09 NOTE — ED Triage Notes (Signed)
Pt brought in by EMS due to back pain. Pt reports that she went to sit back in wheelchair and wheels were not locked and she fell on her but. Complainig of mid back pain  And spasms. Pt reports she has been taking her hydrocodone, ibuprofen and muscle relaxant

## 2017-03-10 MED ORDER — HYDROCODONE-ACETAMINOPHEN 10-325 MG PO TABS: 1.0000 | ORAL_TABLET | Freq: Three times a day (TID) | ORAL | 0 refills | Status: DC

## 2017-03-10 NOTE — Telephone Encounter (Signed)
Lisa Ewing  Call was returned, her schedule appointment was change.  due The hydrocodone prescription printed, she verbalizes understanding.

## 2017-03-17 ENCOUNTER — Ambulatory Visit: Payer: Medicaid Other | Admitting: Physical Medicine & Rehabilitation

## 2017-03-18 ENCOUNTER — Ambulatory Visit: Payer: Medicaid Other | Admitting: Physician Assistant

## 2017-03-19 ENCOUNTER — Ambulatory Visit (INDEPENDENT_AMBULATORY_CARE_PROVIDER_SITE_OTHER): Payer: Medicaid Other | Admitting: Orthopedic Surgery

## 2017-03-20 ENCOUNTER — Encounter: Payer: Self-pay | Admitting: Physician Assistant

## 2017-03-30 ENCOUNTER — Ambulatory Visit (HOSPITAL_BASED_OUTPATIENT_CLINIC_OR_DEPARTMENT_OTHER): Payer: Medicaid Other | Admitting: Physical Medicine & Rehabilitation

## 2017-03-30 ENCOUNTER — Encounter: Payer: Medicaid Other | Attending: Physical Medicine and Rehabilitation

## 2017-03-30 ENCOUNTER — Other Ambulatory Visit: Payer: Self-pay | Admitting: Registered Nurse

## 2017-03-30 VITALS — BP 157/72 | HR 102 | Temp 98.2°F

## 2017-03-30 DIAGNOSIS — M47816 Spondylosis without myelopathy or radiculopathy, lumbar region: Secondary | ICD-10-CM | POA: Insufficient documentation

## 2017-03-30 DIAGNOSIS — M7062 Trochanteric bursitis, left hip: Secondary | ICD-10-CM

## 2017-03-30 DIAGNOSIS — K219 Gastro-esophageal reflux disease without esophagitis: Secondary | ICD-10-CM | POA: Insufficient documentation

## 2017-03-30 DIAGNOSIS — E785 Hyperlipidemia, unspecified: Secondary | ICD-10-CM | POA: Insufficient documentation

## 2017-03-30 DIAGNOSIS — M25551 Pain in right hip: Secondary | ICD-10-CM | POA: Insufficient documentation

## 2017-03-30 DIAGNOSIS — E119 Type 2 diabetes mellitus without complications: Secondary | ICD-10-CM | POA: Diagnosis not present

## 2017-03-30 DIAGNOSIS — E039 Hypothyroidism, unspecified: Secondary | ICD-10-CM | POA: Insufficient documentation

## 2017-03-30 DIAGNOSIS — F17210 Nicotine dependence, cigarettes, uncomplicated: Secondary | ICD-10-CM | POA: Insufficient documentation

## 2017-03-30 DIAGNOSIS — M25512 Pain in left shoulder: Secondary | ICD-10-CM | POA: Diagnosis not present

## 2017-03-30 DIAGNOSIS — J449 Chronic obstructive pulmonary disease, unspecified: Secondary | ICD-10-CM | POA: Diagnosis not present

## 2017-03-30 DIAGNOSIS — G8929 Other chronic pain: Secondary | ICD-10-CM | POA: Diagnosis present

## 2017-03-30 DIAGNOSIS — M47814 Spondylosis without myelopathy or radiculopathy, thoracic region: Secondary | ICD-10-CM | POA: Insufficient documentation

## 2017-03-30 DIAGNOSIS — M810 Age-related osteoporosis without current pathological fracture: Secondary | ICD-10-CM | POA: Diagnosis not present

## 2017-03-30 DIAGNOSIS — M79604 Pain in right leg: Secondary | ICD-10-CM | POA: Diagnosis not present

## 2017-03-30 MED ORDER — HYDROCODONE-ACETAMINOPHEN 10-325 MG PO TABS: 1.0000 | ORAL_TABLET | Freq: Three times a day (TID) | ORAL | 0 refills | Status: DC
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
## 2017-04-01 ENCOUNTER — Ambulatory Visit (INDEPENDENT_AMBULATORY_CARE_PROVIDER_SITE_OTHER): Payer: Medicaid Other | Admitting: Orthopedic Surgery

## 2017-04-02 ENCOUNTER — Other Ambulatory Visit: Payer: Self-pay | Admitting: Registered Nurse

## 2017-04-07 ENCOUNTER — Other Ambulatory Visit: Payer: Self-pay | Admitting: Physician Assistant

## 2017-04-08 ENCOUNTER — Encounter: Payer: Self-pay | Admitting: Physician Assistant

## 2017-04-08 ENCOUNTER — Other Ambulatory Visit: Payer: Self-pay | Admitting: Physician Assistant

## 2017-04-08 ENCOUNTER — Ambulatory Visit (INDEPENDENT_AMBULATORY_CARE_PROVIDER_SITE_OTHER): Payer: Medicaid Other | Admitting: Physician Assistant

## 2017-04-08 VITALS — BP 140/86 | HR 90 | Temp 98.6°F | Resp 16

## 2017-04-08 DIAGNOSIS — J439 Emphysema, unspecified: Secondary | ICD-10-CM | POA: Diagnosis not present

## 2017-04-08 DIAGNOSIS — E785 Hyperlipidemia, unspecified: Secondary | ICD-10-CM | POA: Diagnosis not present

## 2017-04-08 DIAGNOSIS — R1013 Epigastric pain: Secondary | ICD-10-CM

## 2017-04-08 DIAGNOSIS — E039 Hypothyroidism, unspecified: Secondary | ICD-10-CM | POA: Diagnosis not present

## 2017-04-08 DIAGNOSIS — E119 Type 2 diabetes mellitus without complications: Secondary | ICD-10-CM | POA: Diagnosis not present

## 2017-04-08 DIAGNOSIS — R1011 Right upper quadrant pain: Secondary | ICD-10-CM

## 2017-04-08 DIAGNOSIS — R300 Dysuria: Secondary | ICD-10-CM

## 2017-04-08 LAB — CBC WITH DIFFERENTIAL/PLATELET
Basophils Absolute: 0 cells/uL (ref 0–200)
Basophils Relative: 0 %
Eosinophils Absolute: 134 cells/uL (ref 15–500)
Eosinophils Relative: 1 %
HCT: 41.2 % (ref 35.0–45.0)
Hemoglobin: 12.8 g/dL (ref 12.0–15.0)
Lymphocytes Relative: 28 %
Lymphs Abs: 3752 cells/uL (ref 850–3900)
MCH: 24.1 pg — ABNORMAL LOW (ref 27.0–33.0)
MCHC: 31.1 g/dL — ABNORMAL LOW (ref 32.0–36.0)
MCV: 77.4 fL — ABNORMAL LOW (ref 80.0–100.0)
MPV: 9.4 fL (ref 7.5–12.5)
Monocytes Absolute: 804 cells/uL (ref 200–950)
Monocytes Relative: 6 %
Neutro Abs: 8710 cells/uL — ABNORMAL HIGH (ref 1500–7800)
Neutrophils Relative %: 65 %
Platelets: 362 10*3/uL (ref 140–400)
RBC: 5.32 MIL/uL — ABNORMAL HIGH (ref 3.80–5.10)
RDW: 17.5 % — ABNORMAL HIGH (ref 11.0–15.0)
WBC: 13.4 10*3/uL — ABNORMAL HIGH (ref 3.8–10.8)
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
## 2017-04-09 ENCOUNTER — Ambulatory Visit (INDEPENDENT_AMBULATORY_CARE_PROVIDER_SITE_OTHER): Payer: Medicaid Other | Admitting: Orthopedic Surgery

## 2017-04-09 LAB — COMPLETE METABOLIC PANEL WITH GFR
ALK PHOS: 61 U/L (ref 33–130)
ALT: 11 U/L (ref 6–29)
AST: 12 U/L (ref 10–35)
Albumin: 3.7 g/dL (ref 3.6–5.1)
BUN: 18 mg/dL (ref 7–25)
CALCIUM: 8.8 mg/dL (ref 8.6–10.4)
CO2: 22 mmol/L (ref 20–31)
Chloride: 99 mmol/L (ref 98–110)
Creat: 0.94 mg/dL (ref 0.50–0.99)
GFR, Est African American: 76 mL/min (ref >=60)
GFR, Est Non African American: 66 mL/min (ref >=60)
Glucose, Bld: 106 mg/dL — ABNORMAL HIGH (ref 70–99)
Potassium: 4.7 mmol/L (ref 3.5–5.3)
SODIUM: 138 mmol/L (ref 135–146)
Total Bilirubin: 0.2 mg/dL (ref 0.2–1.2)
Total Protein: 6.4 g/dL (ref 6.1–8.1)

## 2017-04-09 LAB — AMYLASE: AMYLASE: 27 U/L (ref 21–101)

## 2017-04-09 LAB — LIPASE: LIPASE: 14 U/L (ref 7–60)

## 2017-04-09 LAB — HEMOGLOBIN A1C
HEMOGLOBIN A1C: 6.2 % — AB (ref <5.7)
MEAN PLASMA GLUCOSE: 131 mg/dL

## 2017-04-10 ENCOUNTER — Other Ambulatory Visit: Payer: Self-pay

## 2017-04-10 DIAGNOSIS — Z87440 Personal history of urinary (tract) infections: Secondary | ICD-10-CM

## 2017-04-11 LAB — IRON,TIBC AND FERRITIN PANEL
%SAT: 7 % — AB (ref 11–50)
FERRITIN: 6 ng/mL — AB (ref 20–288)
Iron: 37 ug/dL — ABNORMAL LOW (ref 45–160)
TIBC: 513 ug/dL — AB (ref 250–450)

## 2017-04-13 ENCOUNTER — Other Ambulatory Visit: Payer: Self-pay | Admitting: Physician Assistant

## 2017-04-13 ENCOUNTER — Other Ambulatory Visit: Payer: Self-pay

## 2017-04-13 DIAGNOSIS — R300 Dysuria: Secondary | ICD-10-CM

## 2017-04-13 NOTE — Telephone Encounter (Signed)
Refill appropriate 

## 2017-04-14 ENCOUNTER — Other Ambulatory Visit: Payer: Self-pay

## 2017-04-14 DIAGNOSIS — E611 Iron deficiency: Secondary | ICD-10-CM

## 2017-04-16 ENCOUNTER — Ambulatory Visit (HOSPITAL_COMMUNITY): Payer: Medicaid Other

## 2017-04-16 ENCOUNTER — Other Ambulatory Visit: Payer: Medicaid Other

## 2017-04-16 DIAGNOSIS — R300 Dysuria: Secondary | ICD-10-CM

## 2017-04-16 LAB — URINALYSIS, MICROSCOPIC ONLY
CASTS: NONE SEEN [LPF]
CRYSTALS: NONE SEEN [HPF]
RBC / HPF: NONE SEEN RBC/HPF (ref <=2)
Yeast: NONE SEEN [HPF]

## 2017-04-16 LAB — URINALYSIS, ROUTINE W REFLEX MICROSCOPIC
Bilirubin Urine: NEGATIVE
Glucose, UA: NEGATIVE
Hgb urine dipstick: NEGATIVE
Nitrite: POSITIVE — AB
Specific Gravity, Urine: >1.03 (ref 1.001–1.035)
pH: 6 (ref 5.0–8.0)

## 2017-04-17 ENCOUNTER — Other Ambulatory Visit: Payer: Self-pay

## 2017-04-17 MED ORDER — NITROFURANTOIN MONOHYD MACRO 100 MG PO CAPS: 100.0000 mg | ORAL_CAPSULE | Freq: Two times a day (BID) | ORAL | 0 refills | Status: DC

## 2017-04-17 NOTE — Progress Notes (Signed)
Nitrofurantoin

## 2017-04-18 ENCOUNTER — Other Ambulatory Visit: Payer: Self-pay | Admitting: Physician Assistant

## 2017-04-19 ENCOUNTER — Other Ambulatory Visit: Payer: Self-pay | Admitting: Physician Assistant

## 2017-04-20 ENCOUNTER — Ambulatory Visit (HOSPITAL_COMMUNITY)
Admission: RE | Admit: 2017-04-20 | Discharge: 2017-04-20 | Disposition: A | Discharge status: Home / Self Care | Payer: Medicaid Other | Source: Ambulatory Visit | Attending: Physician Assistant | Admitting: Physician Assistant

## 2017-04-20 DIAGNOSIS — K769 Liver disease, unspecified: Secondary | ICD-10-CM | POA: Diagnosis not present

## 2017-04-20 DIAGNOSIS — R1011 Right upper quadrant pain: Secondary | ICD-10-CM | POA: Insufficient documentation

## 2017-04-20 DIAGNOSIS — R1013 Epigastric pain: Secondary | ICD-10-CM | POA: Diagnosis not present

## 2017-04-20 DIAGNOSIS — K828 Other specified diseases of gallbladder: Secondary | ICD-10-CM | POA: Diagnosis not present

## 2017-04-21 ENCOUNTER — Telehealth: Payer: Self-pay | Admitting: Internal Medicine

## 2017-04-21 ENCOUNTER — Other Ambulatory Visit: Payer: Self-pay | Admitting: Physician Assistant

## 2017-04-21 NOTE — Telephone Encounter (Signed)
We do not have anything any sooner, please put her on the cancellation list.

## 2017-04-21 NOTE — Telephone Encounter (Signed)
Pt called to say that she had a U/S done yesterday and wanted [place] to be aware. She thinks she needs to come in sooner that 9/5. I told her I would let the nurse be aware and put her on a cancellation list.

## 2017-04-22 ENCOUNTER — Other Ambulatory Visit: Payer: Medicaid Other

## 2017-04-22 DIAGNOSIS — E611 Iron deficiency: Secondary | ICD-10-CM

## 2017-04-22 NOTE — Telephone Encounter (Signed)
ADDED HER TO WAIT LIST

## 2017-04-23 LAB — FECAL GLOBIN BY IMMUNOCHEMISTRY
FECAL GLOBIN IMMUNO: NOT DETECTED
Fecal Globin Immuno: NOT DETECTED
Fecal Globin Immuno: NOT DETECTED

## 2017-04-29 ENCOUNTER — Ambulatory Visit (HOSPITAL_COMMUNITY): Payer: Medicaid Other

## 2017-04-30 ENCOUNTER — Ambulatory Visit (INDEPENDENT_AMBULATORY_CARE_PROVIDER_SITE_OTHER): Payer: Medicaid Other | Admitting: Orthopedic Surgery

## 2017-05-12 ENCOUNTER — Encounter: Payer: Self-pay | Admitting: Registered Nurse

## 2017-05-12 ENCOUNTER — Encounter: Payer: Medicaid Other | Attending: Physical Medicine and Rehabilitation | Admitting: Registered Nurse

## 2017-05-12 VITALS — BP 155/84 | HR 114

## 2017-05-12 DIAGNOSIS — K219 Gastro-esophageal reflux disease without esophagitis: Secondary | ICD-10-CM | POA: Insufficient documentation

## 2017-05-12 DIAGNOSIS — G8929 Other chronic pain: Secondary | ICD-10-CM

## 2017-05-12 DIAGNOSIS — M7061 Trochanteric bursitis, right hip: Secondary | ICD-10-CM

## 2017-05-12 DIAGNOSIS — M47814 Spondylosis without myelopathy or radiculopathy, thoracic region: Secondary | ICD-10-CM | POA: Insufficient documentation

## 2017-05-12 DIAGNOSIS — G894 Chronic pain syndrome: Secondary | ICD-10-CM | POA: Diagnosis not present

## 2017-05-12 DIAGNOSIS — M546 Pain in thoracic spine: Secondary | ICD-10-CM

## 2017-05-12 DIAGNOSIS — IMO0002 Reserved for concepts with insufficient information to code with codable children: Secondary | ICD-10-CM

## 2017-05-12 DIAGNOSIS — M5416 Radiculopathy, lumbar region: Secondary | ICD-10-CM

## 2017-05-12 DIAGNOSIS — M7062 Trochanteric bursitis, left hip: Secondary | ICD-10-CM

## 2017-05-12 DIAGNOSIS — M75101 Unspecified rotator cuff tear or rupture of right shoulder, not specified as traumatic: Secondary | ICD-10-CM

## 2017-05-12 DIAGNOSIS — M810 Age-related osteoporosis without current pathological fracture: Secondary | ICD-10-CM | POA: Diagnosis not present

## 2017-05-12 DIAGNOSIS — M62830 Muscle spasm of back: Secondary | ICD-10-CM | POA: Diagnosis not present

## 2017-05-12 DIAGNOSIS — M25551 Pain in right hip: Secondary | ICD-10-CM | POA: Diagnosis not present

## 2017-05-12 DIAGNOSIS — Z79899 Other long term (current) drug therapy: Secondary | ICD-10-CM | POA: Diagnosis not present

## 2017-05-12 DIAGNOSIS — J449 Chronic obstructive pulmonary disease, unspecified: Secondary | ICD-10-CM | POA: Insufficient documentation

## 2017-05-12 DIAGNOSIS — E785 Hyperlipidemia, unspecified: Secondary | ICD-10-CM | POA: Insufficient documentation

## 2017-05-12 DIAGNOSIS — E039 Hypothyroidism, unspecified: Secondary | ICD-10-CM | POA: Insufficient documentation

## 2017-05-12 DIAGNOSIS — M79604 Pain in right leg: Secondary | ICD-10-CM | POA: Diagnosis not present

## 2017-05-12 DIAGNOSIS — F17210 Nicotine dependence, cigarettes, uncomplicated: Secondary | ICD-10-CM | POA: Diagnosis not present

## 2017-05-12 DIAGNOSIS — E119 Type 2 diabetes mellitus without complications: Secondary | ICD-10-CM | POA: Diagnosis not present

## 2017-05-12 DIAGNOSIS — M25512 Pain in left shoulder: Secondary | ICD-10-CM | POA: Diagnosis not present

## 2017-05-12 DIAGNOSIS — M961 Postlaminectomy syndrome, not elsewhere classified: Secondary | ICD-10-CM

## 2017-05-12 DIAGNOSIS — M47816 Spondylosis without myelopathy or radiculopathy, lumbar region: Secondary | ICD-10-CM | POA: Diagnosis not present

## 2017-05-12 DIAGNOSIS — Z5181 Encounter for therapeutic drug level monitoring: Secondary | ICD-10-CM

## 2017-05-12 MED ORDER — HYDROCODONE-ACETAMINOPHEN 10-325 MG PO TABS: 1.0000 | ORAL_TABLET | Freq: Three times a day (TID) | ORAL | 0 refills | Status: DC
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
## 2017-05-13 ENCOUNTER — Other Ambulatory Visit: Payer: Self-pay | Admitting: Physician Assistant

## 2017-05-13 ENCOUNTER — Ambulatory Visit (INDEPENDENT_AMBULATORY_CARE_PROVIDER_SITE_OTHER): Payer: Medicaid Other | Admitting: Gastroenterology

## 2017-05-13 ENCOUNTER — Encounter: Payer: Self-pay | Admitting: Gastroenterology

## 2017-05-13 DIAGNOSIS — D509 Iron deficiency anemia, unspecified: Secondary | ICD-10-CM

## 2017-05-13 DIAGNOSIS — K760 Fatty (change of) liver, not elsewhere classified: Secondary | ICD-10-CM | POA: Diagnosis not present

## 2017-05-13 DIAGNOSIS — R1010 Upper abdominal pain, unspecified: Secondary | ICD-10-CM

## 2017-05-13 DIAGNOSIS — R1011 Right upper quadrant pain: Secondary | ICD-10-CM

## 2017-05-13 NOTE — Patient Instructions (Signed)
1. We will call and get you scheduled for CT scan of your abdomin in near future.    Fatty Liver Fatty liver, also called hepatic steatosis or steatohepatitis, is a condition in which too much fat has built up in your liver cells. The liver removes harmful substances from your bloodstream. It produces fluids your body needs. It also helps your body use and store energy from the food you eat. In many cases, fatty liver does not cause symptoms or problems. It is often diagnosed when tests are being done for other reasons. However, over time, fatty liver can cause inflammation that may lead to more serious liver problems, such as scarring of the liver (cirrhosis). What are the causes? Causes of fatty liver may include:  Drinking too much alcohol.  Poor nutrition.  Obesity.  Cushing syndrome.  Diabetes.  Hyperlipidemia.  Pregnancy.  Certain drugs.  Poisons.  Some viral infections.  What increases the risk? You may be more likely to develop fatty liver if you:  Abuse alcohol.  Are pregnant.  Are overweight.  Have diabetes.  Have hepatitis.  Have a high triglyceride level.  What are the signs or symptoms? Fatty liver often does not cause any symptoms. In cases where symptoms develop, they can include:  Fatigue.  Weakness.  Weight loss.  Confusion.  Abdominal pain.  Yellowing of your skin and the white parts of your eyes (jaundice).  Nausea and vomiting.  How is this diagnosed? Fatty liver may be diagnosed by:  Physical exam and medical history.  Blood tests.  Imaging tests, such as an ultrasound, CT scan, or MRI.  Liver biopsy. A small sample of liver tissue is removed using a needle. The sample is then looked at under a microscope.  How is this treated? Fatty liver is often caused by other health conditions. Treatment for fatty liver may involve medicines and lifestyle changes to manage conditions such as:  Alcoholism.  High  cholesterol.  Diabetes.  Being overweight or obese.  Follow these instructions at home:  Eat a healthy diet as directed by your health care provider.  Exercise regularly. This can help you lose weight and control your cholesterol and diabetes. Talk to your health care provider about an exercise plan and which activities are best for you.  Do not drink alcohol.  Take medicines only as directed by your health care provider. Contact a health care provider if: You have difficulty controlling your:  Blood sugar.  Cholesterol.  Alcohol consumption.  Get help right away if:  You have abdominal pain.  You have jaundice.  You have nausea and vomiting. This information is not intended to replace advice given to you by your health care provider. Make sure you discuss any questions you have with your health care provider. Document Released: 10/10/2005 Document Revised: 01/31/2016 Document Reviewed: 01/04/2014 Elsevier Interactive Patient Education  [employer].
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
## 2017-05-14 ENCOUNTER — Ambulatory Visit (INDEPENDENT_AMBULATORY_CARE_PROVIDER_SITE_OTHER): Payer: Medicaid Other | Admitting: Orthopedic Surgery

## 2017-05-14 ENCOUNTER — Encounter (INDEPENDENT_AMBULATORY_CARE_PROVIDER_SITE_OTHER): Payer: Self-pay | Admitting: Orthopedic Surgery

## 2017-05-14 ENCOUNTER — Ambulatory Visit (INDEPENDENT_AMBULATORY_CARE_PROVIDER_SITE_OTHER): Payer: Medicaid Other

## 2017-05-14 ENCOUNTER — Encounter: Payer: Self-pay | Admitting: Gastroenterology

## 2017-05-14 ENCOUNTER — Telehealth: Payer: Self-pay

## 2017-05-14 DIAGNOSIS — M25512 Pain in left shoulder: Secondary | ICD-10-CM

## 2017-05-14 DIAGNOSIS — M19011 Primary osteoarthritis, right shoulder: Secondary | ICD-10-CM | POA: Diagnosis not present

## 2017-05-14 DIAGNOSIS — K760 Fatty (change of) liver, not elsewhere classified: Secondary | ICD-10-CM | POA: Insufficient documentation

## 2017-05-14 DIAGNOSIS — G8929 Other chronic pain: Secondary | ICD-10-CM

## 2017-05-14 NOTE — Assessment & Plan Note (Signed)
Fatty liver seen on ultrasound. LFTs were normal. Physical limitations precludes her from aerobic exercise. Discussed tight glycemic control, weight management. Monitor LFTs twice yearly.

## 2017-05-14 NOTE — Assessment & Plan Note (Signed)
Recent labs indicate iron deficiency, normal hemoglobin. EGD and colonoscopy 2016 as outlined above.  Await CT findings. Further recommendations to follow.

## 2017-05-14 NOTE — Telephone Encounter (Deleted)
CT abd/pelvis with contrast scheduled for 05/25/17 at 1:00pm, pt to arrive at 12:45pm. NPO 4 hours before test, pt to pick-up contrast. Called and informed pt's aide. She said pt has another appt that day. Gave her phone number to central scheduling so she can reschedule for appt that is convenient for her.

## 2017-05-14 NOTE — Assessment & Plan Note (Signed)
61 year old female presenting with acute on chronic abdominal pain, postprandial in nature. Recent right upper quadrant ultrasound showed gallbladder sludge, fatty liver. She has postprandial nausea, pain but no vomiting. Feels like food is not digesting. In the remote past she had borderline abnormal gastric emptying on GES. Last EGD done in 2016, gastritis but no H. pylori.  Given radiation of pain towards the umbilicus, would consider possibility of hernia, adhesions. Cannot completely exclude biliary etiology or gastroparesis.  At this time would recommend CT abdomen pelvis with contrast. Encouraged her to maintain Carafate slurries consume 4 times daily. Encouraged bland diet for now. Further recommendations to follow.

## 2017-05-14 NOTE — Telephone Encounter (Signed)
CT abd/pelvis with contrast scheduled for 05/25/17 at 1:00pm, pt to arrive at 12:45pm. NPO 4 hours before test, pt to pick-up contrast. Called and informed pt's aide. Letter mailed.

## 2017-05-14 NOTE — Patient Instructions (Signed)
PA info for CT abd/pelvis w/contrast submitted via [facility]. Case approved. PA# [id number], 05/14/17-06/13/17.

## 2017-05-14 NOTE — Progress Notes (Signed)
cc'ed to pcp °

## 2017-05-15 ENCOUNTER — Telehealth: Payer: Self-pay | Admitting: Registered Nurse

## 2017-05-15 DIAGNOSIS — M19011 Primary osteoarthritis, right shoulder: Secondary | ICD-10-CM | POA: Diagnosis not present

## 2017-05-15 MED ORDER — METHYLPREDNISOLONE ACETATE 40 MG/ML IJ SUSP
40.0000 mg | INTRAMUSCULAR | Status: AC | PRN
  Administered 2017-05-15: 40 mg via INTRA_ARTICULAR

## 2017-05-15 MED ORDER — BUPIVACAINE HCL 0.5 % IJ SOLN
9.0000 mL | INTRAMUSCULAR | Status: AC | PRN
  Administered 2017-05-15: 9 mL via INTRA_ARTICULAR

## 2017-05-15 MED ORDER — LIDOCAINE HCL 1 % IJ SOLN
5.0000 mL | INTRAMUSCULAR | Status: AC | PRN
  Administered 2017-05-15: 5 mL
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
## 2017-05-15 NOTE — Telephone Encounter (Signed)
On 05/15/2017 the  [place] was reviewed no conflict was seen on the [place] with multiple prescribers. Ms. Landgrebe has a signed narcotic contract with our office. If there were any discrepancies this would have been reported to her physician.

## 2017-05-18 LAB — DRUG TOX MONITOR 1 W/CONF, ORAL FLD
AMPHETAMINES: NEGATIVE ng/mL (ref <10)
BUPRENORPHINE: NEGATIVE ng/mL (ref <0.025)
Barbiturates: NEGATIVE ng/mL (ref <10)
Benzodiazepines: NEGATIVE ng/mL (ref <0.50)
COCAINE: NEGATIVE ng/mL (ref <2.5)
Codeine: NEGATIVE ng/mL (ref <2.5)
Cotinine: 9 ng/mL — ABNORMAL HIGH (ref <5.0)
Dihydrocodeine: 4 ng/mL — ABNORMAL HIGH (ref <2.5)
FENTANYL: NEGATIVE ng/mL (ref <0.10)
HYDROCODONE: 42.4 ng/mL — AB (ref <2.5)
HYDROMORPHONE: NEGATIVE ng/mL (ref <2.5)
Heroin Metabolite: NEGATIVE ng/mL (ref <1.0)
MARIJUANA: NEGATIVE ng/mL (ref <2.5)
MDMA: NEGATIVE ng/mL (ref <10)
MEPERIDINE: NEGATIVE ng/mL (ref <5.0)
MEPROBAMATE: NEGATIVE ng/mL (ref <2.5)
METHADONE: NEGATIVE ng/mL (ref <5.0)
MORPHINE: NEGATIVE ng/mL (ref <2.5)
NORHYDROCODONE: NEGATIVE ng/mL (ref <2.5)
NOROXYCODONE: NEGATIVE ng/mL (ref <2.5)
Nicotine Metabolite: POSITIVE ng/mL — AB (ref <5.0)
OPIATES: POSITIVE ng/mL — AB (ref <2.5)
Oxycodone: NEGATIVE ng/mL (ref <2.5)
Oxymorphone: NEGATIVE ng/mL (ref <2.5)
PHENCYCLIDINE: NEGATIVE ng/mL (ref <10)
PROPOXYPHENE: NEGATIVE ng/mL (ref <5.0)
TAPENTADOL: NEGATIVE ng/mL (ref <5.0)
TRAMADOL: NEGATIVE ng/mL (ref <5.0)
Zolpidem: NEGATIVE ng/mL (ref <5.0)

## 2017-05-18 LAB — DRUG TOX ALC METAB W/CON, ORAL FLD: Alcohol Metabolite: NEGATIVE ng/mL (ref <25)

## 2017-05-19 ENCOUNTER — Telehealth: Payer: Self-pay | Admitting: *Deleted

## 2017-05-19 ENCOUNTER — Other Ambulatory Visit: Payer: Self-pay | Admitting: Physician Assistant

## 2017-05-19 NOTE — Telephone Encounter (Signed)
Oral swab drug screen was consistent for prescribed medications.  ?

## 2017-05-21 ENCOUNTER — Ambulatory Visit (HOSPITAL_COMMUNITY): Payer: Medicaid Other

## 2017-05-25 ENCOUNTER — Ambulatory Visit (HOSPITAL_COMMUNITY): Payer: Medicaid Other

## 2017-05-25 IMAGING — MR MR CERVICAL SPINE W/O CM
4 of 5 series · 27 of 48 positions shown · non-contrast
Comparison: MRI dated 08/26/2011

CLINICAL DATA: Neck pain and right shoulder and arm pain since a
fall in July 2016.

EXAM:
MRI CERVICAL SPINE WITHOUT CONTRAST
TECHNIQUE: Multiplanar, multisequence MR imaging of the cervical spine was
performed. No intravenous contrast was administered.

[Series 6: T1 · sagittal · 3.0mm · 0.66mm/px · 6 of 15 slices shown]
[im 1/15]
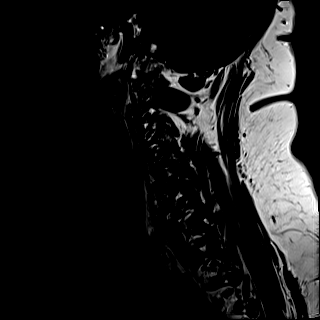
[im 3/15]
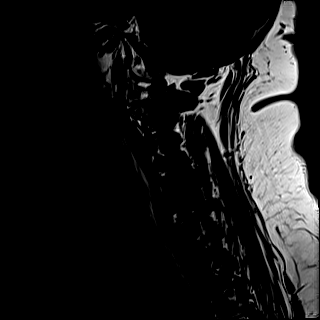
[im 6/15]
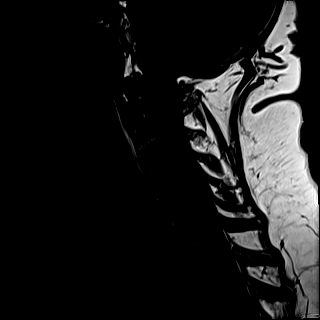
[im 9/15]
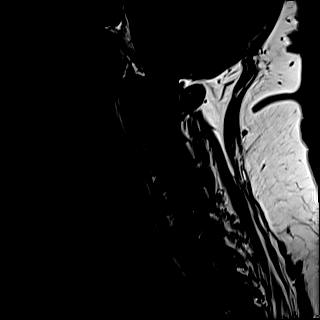
[im 12/15]
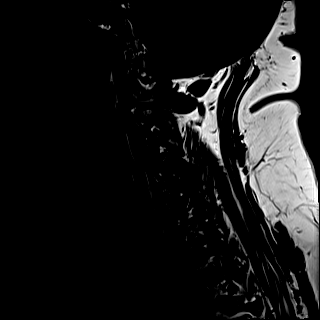
[im 15/15]
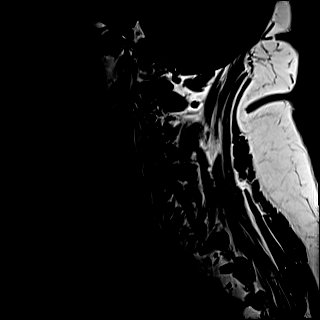

[Series 7: T2 · sagittal · 3.0mm · 0.55mm/px · 7 of 15 slices shown (1 of 2)]
[im 1/15]
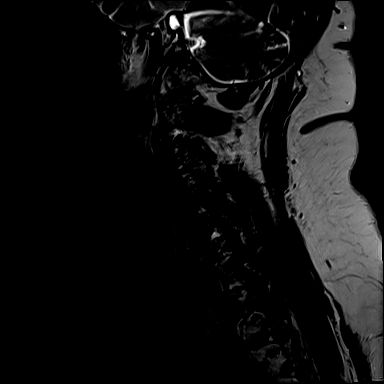
[im 3/15]
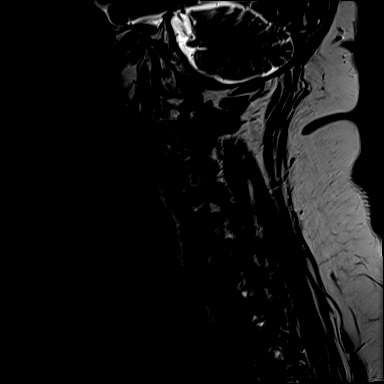
[im 5/15]
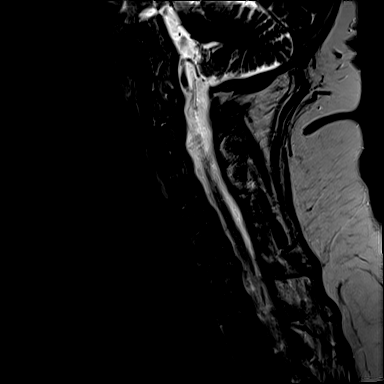
[im 8/15]
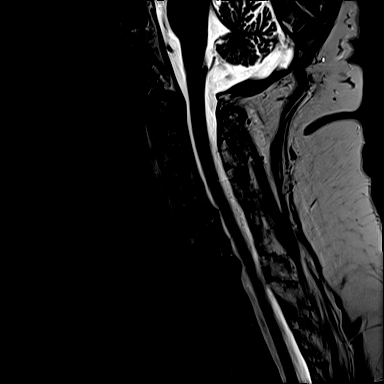
[im 10/15]
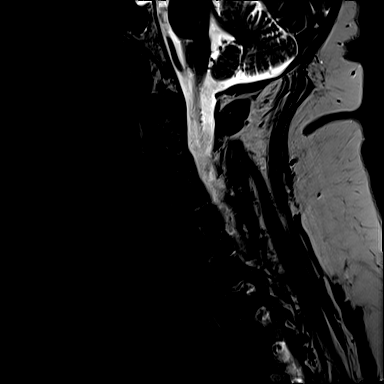
[im 12/15]
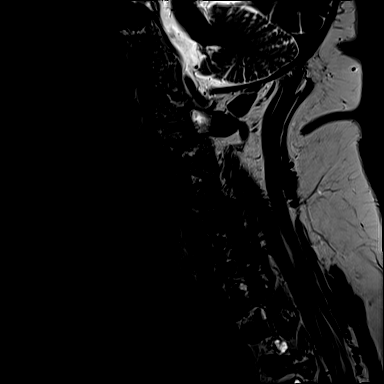
[im 15/15]
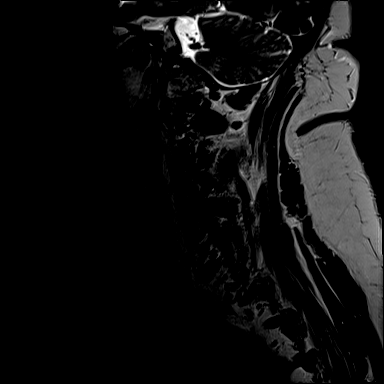

[Series 9: T2 · axial · 3.0mm · 0.50mm/px · z∈[-73,+20]mm · 8 of 32 slices shown (2 of 2)]
[im 1/32]
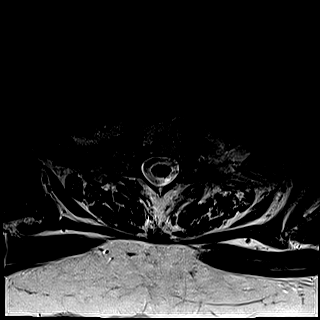
[im 5/32]
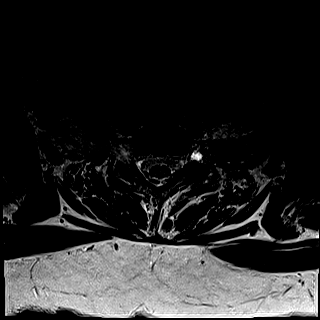
[im 10/32]
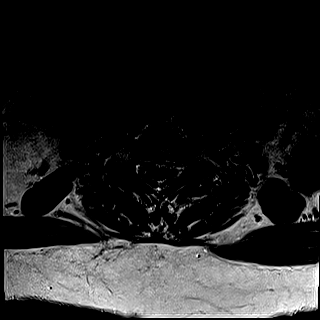
[im 15/32]
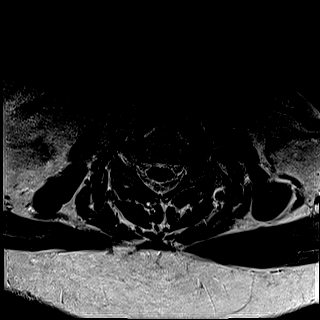
[im 17/32]
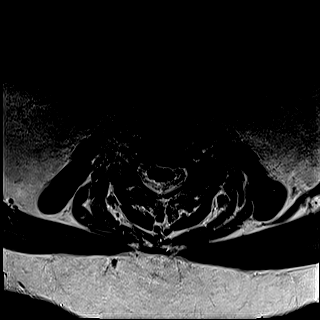
[im 22/32]
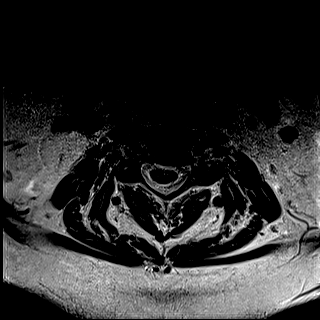
[im 27/32]
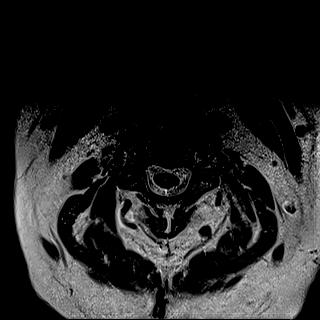
[im 32/32]
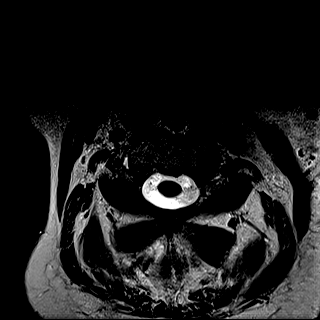

[Series 11: STIR · sagittal · 3.0mm · 0.33mm/px · 6 of 15 slices shown]
[im 1/15]
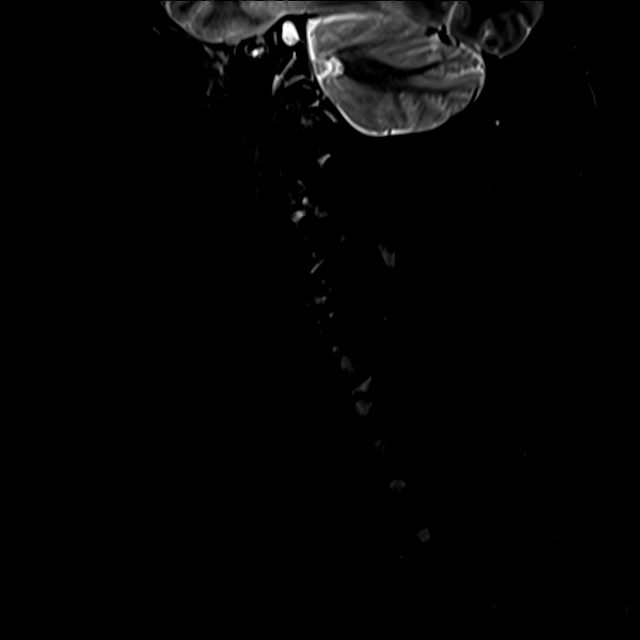
[im 3/15]
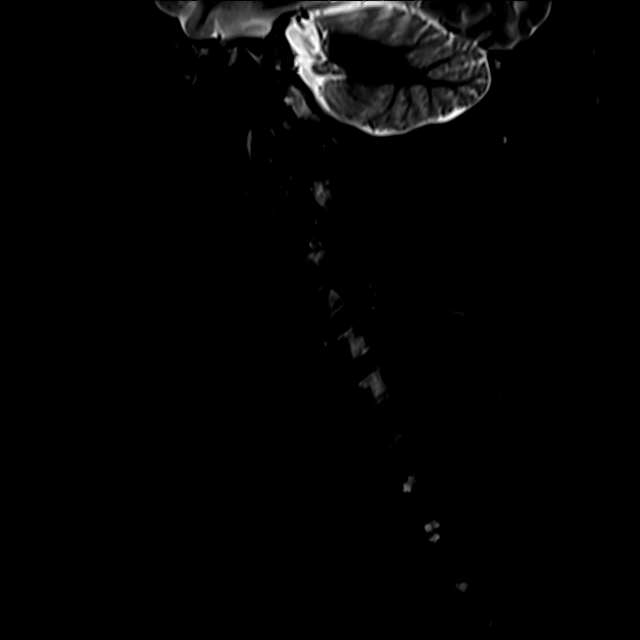
[im 5/15]
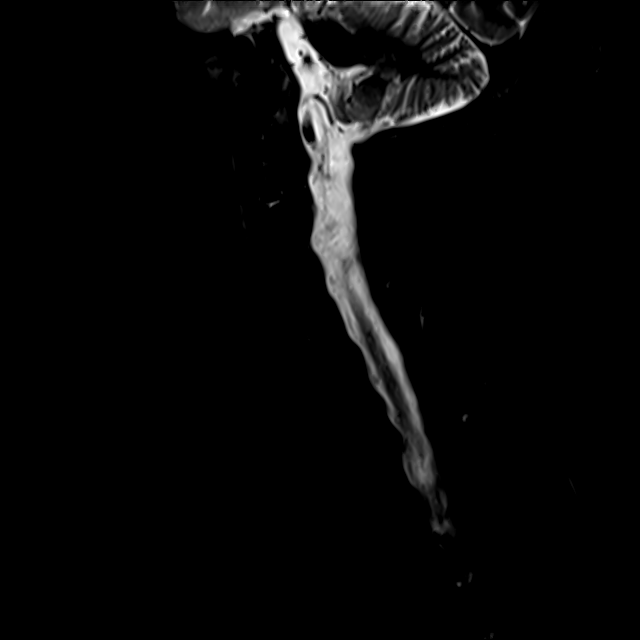
[im 8/15]
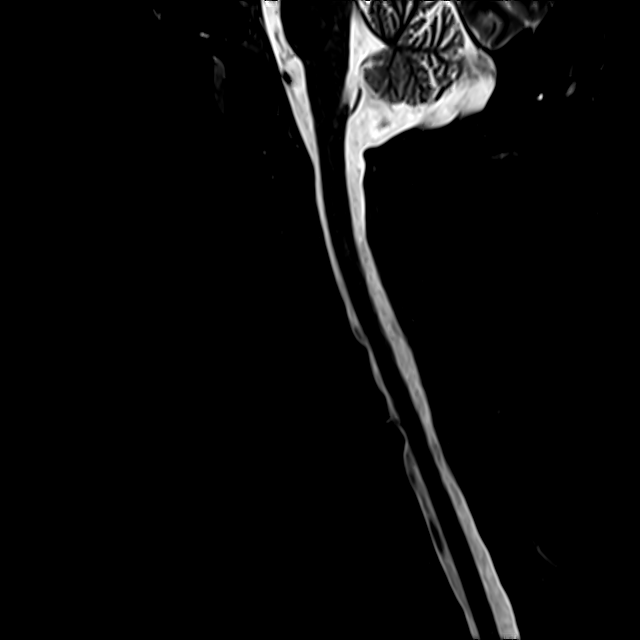
[im 10/15]
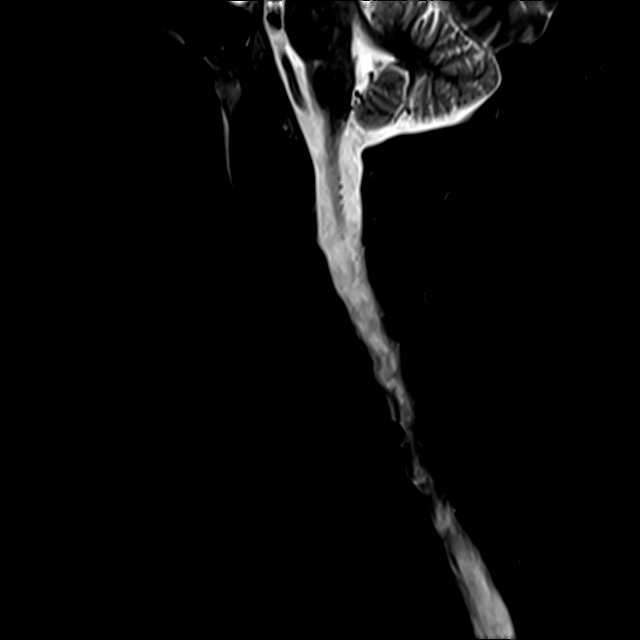
[im 12/15]
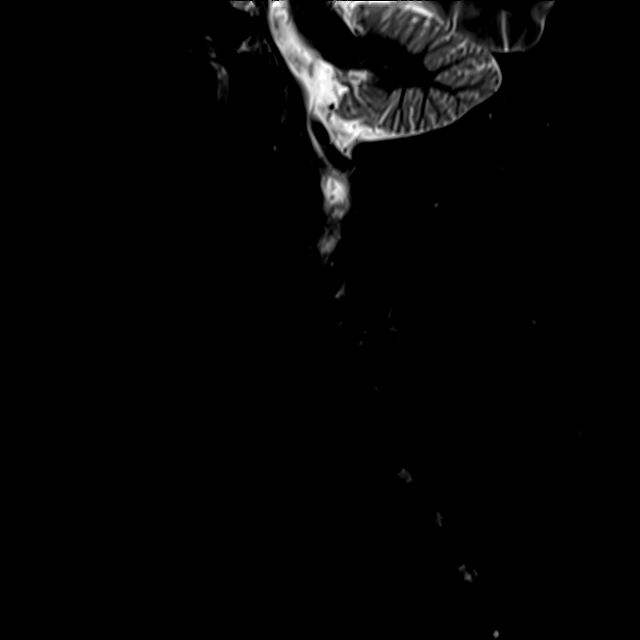

[27 of 48 positions shown; findings below may reference images not displayed]

FINDINGS: Alignment: Physiologic.

Vertebrae: No fracture, evidence of discitis, or bone lesion.

Cord: Normal signal and morphology.

Posterior Fossa, vertebral arteries, paraspinal tissues: Negative.

Disc levels:

Craniocervical junction through C3-4:  Normal.

C4-5: Disc degeneration. Tiny broad-based disc bulge with no neural
impingement. Widely patent neural foramina. Severe left facet
arthritis with some inflammation in the joint as indicated by edema
in around the joint.

C5-6:  Negative.

C6-7: Degenerative changes of the vertebral endplates. Broad-based
disc osteophyte complex without neural impingement. Slight bilateral
foraminal narrowing.

C7-T1 and T1-2:  Negative.
IMPRESSION: 1. Severe left facet arthritis at C3-4 with inflammation in around
the joint.
2. Slight degenerative disc disease at C4-5 and to a slightly
greater degree at C6-7 without neural impingement at the other
levels

## 2017-05-27 ENCOUNTER — Other Ambulatory Visit: Payer: Self-pay | Admitting: Physician Assistant

## 2017-05-29 ENCOUNTER — Other Ambulatory Visit: Payer: Medicaid Other

## 2017-06-03 ENCOUNTER — Ambulatory Visit (HOSPITAL_COMMUNITY): Payer: Medicaid Other

## 2017-06-03 ENCOUNTER — Ambulatory Visit (HOSPITAL_COMMUNITY): Admission: RE | Admit: 2017-06-03 | Payer: Medicaid Other | Source: Ambulatory Visit

## 2017-06-08 ENCOUNTER — Other Ambulatory Visit: Payer: Self-pay | Admitting: Registered Nurse

## 2017-06-09 ENCOUNTER — Other Ambulatory Visit (HOSPITAL_COMMUNITY): Payer: Medicaid Other

## 2017-06-10 ENCOUNTER — Other Ambulatory Visit: Payer: Self-pay | Admitting: Physician Assistant

## 2017-06-12 ENCOUNTER — Ambulatory Visit (HOSPITAL_COMMUNITY): Payer: Medicaid Other

## 2017-06-16 ENCOUNTER — Encounter: Payer: Self-pay | Admitting: Registered Nurse

## 2017-06-16 ENCOUNTER — Encounter: Payer: Medicaid Other | Attending: Physical Medicine and Rehabilitation | Admitting: Registered Nurse

## 2017-06-16 VITALS — BP 112/73 | HR 101

## 2017-06-16 DIAGNOSIS — M79604 Pain in right leg: Secondary | ICD-10-CM | POA: Insufficient documentation

## 2017-06-16 DIAGNOSIS — M47814 Spondylosis without myelopathy or radiculopathy, thoracic region: Secondary | ICD-10-CM | POA: Diagnosis not present

## 2017-06-16 DIAGNOSIS — G8929 Other chronic pain: Secondary | ICD-10-CM | POA: Diagnosis present

## 2017-06-16 DIAGNOSIS — M25512 Pain in left shoulder: Secondary | ICD-10-CM | POA: Insufficient documentation

## 2017-06-16 DIAGNOSIS — J449 Chronic obstructive pulmonary disease, unspecified: Secondary | ICD-10-CM | POA: Diagnosis not present

## 2017-06-16 DIAGNOSIS — M546 Pain in thoracic spine: Secondary | ICD-10-CM | POA: Diagnosis not present

## 2017-06-16 DIAGNOSIS — M25551 Pain in right hip: Secondary | ICD-10-CM | POA: Diagnosis not present

## 2017-06-16 DIAGNOSIS — F17210 Nicotine dependence, cigarettes, uncomplicated: Secondary | ICD-10-CM | POA: Diagnosis not present

## 2017-06-16 DIAGNOSIS — E785 Hyperlipidemia, unspecified: Secondary | ICD-10-CM | POA: Diagnosis not present

## 2017-06-16 DIAGNOSIS — M810 Age-related osteoporosis without current pathological fracture: Secondary | ICD-10-CM | POA: Insufficient documentation

## 2017-06-16 DIAGNOSIS — M7061 Trochanteric bursitis, right hip: Secondary | ICD-10-CM | POA: Diagnosis not present

## 2017-06-16 DIAGNOSIS — M62830 Muscle spasm of back: Secondary | ICD-10-CM | POA: Diagnosis not present

## 2017-06-16 DIAGNOSIS — E119 Type 2 diabetes mellitus without complications: Secondary | ICD-10-CM | POA: Diagnosis not present

## 2017-06-16 DIAGNOSIS — Z5181 Encounter for therapeutic drug level monitoring: Secondary | ICD-10-CM

## 2017-06-16 DIAGNOSIS — Z79899 Other long term (current) drug therapy: Secondary | ICD-10-CM

## 2017-06-16 DIAGNOSIS — K219 Gastro-esophageal reflux disease without esophagitis: Secondary | ICD-10-CM | POA: Insufficient documentation

## 2017-06-16 DIAGNOSIS — M5416 Radiculopathy, lumbar region: Secondary | ICD-10-CM | POA: Diagnosis not present

## 2017-06-16 DIAGNOSIS — E039 Hypothyroidism, unspecified: Secondary | ICD-10-CM | POA: Diagnosis not present

## 2017-06-16 DIAGNOSIS — G894 Chronic pain syndrome: Secondary | ICD-10-CM | POA: Diagnosis not present

## 2017-06-16 DIAGNOSIS — M47816 Spondylosis without myelopathy or radiculopathy, lumbar region: Secondary | ICD-10-CM | POA: Insufficient documentation

## 2017-06-16 DIAGNOSIS — F3289 Other specified depressive episodes: Secondary | ICD-10-CM | POA: Diagnosis not present

## 2017-06-16 DIAGNOSIS — M961 Postlaminectomy syndrome, not elsewhere classified: Secondary | ICD-10-CM

## 2017-06-16 MED ORDER — GABAPENTIN 300 MG PO CAPS: ORAL_CAPSULE | ORAL | 2 refills | Status: DC

## 2017-06-16 MED ORDER — HYDROCODONE-ACETAMINOPHEN 10-325 MG PO TABS: 1.0000 | ORAL_TABLET | Freq: Three times a day (TID) | ORAL | 0 refills | Status: DC
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
## 2017-06-17 ENCOUNTER — Ambulatory Visit: Payer: Medicaid Other | Admitting: Urology

## 2017-06-17 ENCOUNTER — Telehealth: Payer: Self-pay | Admitting: Registered Nurse

## 2017-06-17 NOTE — Telephone Encounter (Signed)
On 06/16/2017 the  [name] was reviewed no conflict was seen on the [name] with multiple prescribers. Lisa Ewing has a signed narcotic contract with our office. If there were any discrepancies this would have been reported to her physician.

## 2017-06-19 ENCOUNTER — Other Ambulatory Visit: Payer: Self-pay | Admitting: Registered Nurse

## 2017-06-19 ENCOUNTER — Ambulatory Visit (HOSPITAL_COMMUNITY): Payer: Medicaid Other

## 2017-06-23 NOTE — Patient Instructions (Signed)
Received fax from [facility]. CT abd/pelvis w/contrast approved. PA# [id number], valid 06/19/11-07/18/17. According to pt's appt desk, she was originally scheduled to have CT 06/19/17 and it was rescheduled to 07/01/17.

## 2017-06-29 ENCOUNTER — Telehealth: Payer: Self-pay

## 2017-06-29 NOTE — Telephone Encounter (Signed)
Please find out and document what the CAT program is what she is going to be doing at these visits

## 2017-06-29 NOTE — Telephone Encounter (Signed)
Sarah with [facility] called and is requesting a verbal to see patient once every 2 months under CAT program.

## 2017-06-30 ENCOUNTER — Other Ambulatory Visit: Payer: Self-pay | Admitting: Physician Assistant

## 2017-06-30 NOTE — Telephone Encounter (Signed)
Spoke with Judson Roch and she states a nurse will go out and check on patient and make sure patient is doing okay and make sure nothing is going on. They will perform a skin assessment where patient could be prone to have skin problems, they will also check her medications, check vitals as part of the medicaid  CAPS  program.

## 2017-07-01 ENCOUNTER — Ambulatory Visit (HOSPITAL_COMMUNITY)
Admission: RE | Admit: 2017-07-01 | Discharge: 2017-07-01 | Disposition: A | Discharge status: Home / Self Care | Payer: Medicaid Other | Source: Ambulatory Visit | Attending: Gastroenterology | Admitting: Gastroenterology

## 2017-07-01 ENCOUNTER — Ambulatory Visit (HOSPITAL_COMMUNITY)
Admission: RE | Admit: 2017-07-01 | Discharge: 2017-07-01 | Disposition: A | Discharge status: Home / Self Care | Payer: Medicaid Other | Source: Ambulatory Visit | Attending: Physician Assistant | Admitting: Physician Assistant

## 2017-07-01 DIAGNOSIS — I313 Pericardial effusion (noninflammatory): Secondary | ICD-10-CM | POA: Diagnosis not present

## 2017-07-01 DIAGNOSIS — Z1231 Encounter for screening mammogram for malignant neoplasm of breast: Secondary | ICD-10-CM

## 2017-07-01 DIAGNOSIS — Z79899 Other long term (current) drug therapy: Secondary | ICD-10-CM | POA: Insufficient documentation

## 2017-07-01 DIAGNOSIS — I700 Atherosclerosis of aorta: Secondary | ICD-10-CM | POA: Insufficient documentation

## 2017-07-01 DIAGNOSIS — R1010 Upper abdominal pain, unspecified: Secondary | ICD-10-CM | POA: Insufficient documentation

## 2017-07-01 DIAGNOSIS — K760 Fatty (change of) liver, not elsewhere classified: Secondary | ICD-10-CM | POA: Diagnosis not present

## 2017-07-01 DIAGNOSIS — R1011 Right upper quadrant pain: Secondary | ICD-10-CM

## 2017-07-01 LAB — POCT I-STAT CREATININE: CREATININE: 1 mg/dL (ref 0.44–1.00)

## 2017-07-01 LAB — HM MAMMOGRAPHY

## 2017-07-01 MED ORDER — IOPAMIDOL (ISOVUE-300) INJECTION 61%
120.0000 mL | Freq: Once | INTRAVENOUS | Status: AC | PRN
  Administered 2017-07-01: 120 mL via INTRAVENOUS

## 2017-07-01 NOTE — Telephone Encounter (Signed)
Lisa Ewing is aware.

## 2017-07-01 NOTE — Telephone Encounter (Signed)
Approved.  

## 2017-07-09 ENCOUNTER — Ambulatory Visit: Payer: Medicaid Other | Admitting: Physician Assistant

## 2017-07-11 NOTE — Progress Notes (Signed)
She has a moderate-sized pericardial effusion (fluid in the sac around the heart). NEEDS TO SEE CARDIOLOGIST WITHIN 1 WEEK. If she develops chest pain/shortness of breath different from her baseline symptoms, then she should go to ER.   She has severe fatty liver. Instructions for fatty liver: Recommend 1-2# weight loss per week until ideal body weight through exercise & diet. Low fat/cholesterol diet.   Avoid sweets, sodas, fruit juices, sweetened beverages like tea, etc. Gradually increase exercise from 15 min daily up to 1 hr per day 5 days/week. Limit alcohol use.  She has recent diagnosis of IDA in 04/2017. RECOMMEND UPDATE CBC, IRON/TIBC/FERRITIN. IFOBT.  Please make ov with rmr only in 2-3 weeks to discuss next step for abd pain, IDA, fatty liver.

## 2017-07-13 ENCOUNTER — Other Ambulatory Visit: Payer: Self-pay

## 2017-07-13 ENCOUNTER — Other Ambulatory Visit: Payer: Self-pay | Admitting: *Deleted

## 2017-07-13 DIAGNOSIS — K219 Gastro-esophageal reflux disease without esophagitis: Secondary | ICD-10-CM

## 2017-07-13 DIAGNOSIS — I3139 Other pericardial effusion (noninflammatory): Secondary | ICD-10-CM

## 2017-07-13 DIAGNOSIS — I313 Pericardial effusion (noninflammatory): Secondary | ICD-10-CM

## 2017-07-14 ENCOUNTER — Encounter: Payer: Self-pay | Admitting: Physical Medicine & Rehabilitation

## 2017-07-14 ENCOUNTER — Ambulatory Visit: Payer: Medicaid Other | Admitting: Physical Medicine & Rehabilitation

## 2017-07-14 ENCOUNTER — Encounter: Payer: Medicaid Other | Attending: Physical Medicine and Rehabilitation

## 2017-07-14 VITALS — BP 130/94 | HR 108

## 2017-07-14 DIAGNOSIS — E785 Hyperlipidemia, unspecified: Secondary | ICD-10-CM | POA: Insufficient documentation

## 2017-07-14 DIAGNOSIS — M25512 Pain in left shoulder: Secondary | ICD-10-CM | POA: Insufficient documentation

## 2017-07-14 DIAGNOSIS — M25551 Pain in right hip: Secondary | ICD-10-CM | POA: Insufficient documentation

## 2017-07-14 DIAGNOSIS — M47814 Spondylosis without myelopathy or radiculopathy, thoracic region: Secondary | ICD-10-CM | POA: Diagnosis not present

## 2017-07-14 DIAGNOSIS — M7062 Trochanteric bursitis, left hip: Secondary | ICD-10-CM

## 2017-07-14 DIAGNOSIS — M47816 Spondylosis without myelopathy or radiculopathy, lumbar region: Secondary | ICD-10-CM | POA: Diagnosis not present

## 2017-07-14 DIAGNOSIS — J449 Chronic obstructive pulmonary disease, unspecified: Secondary | ICD-10-CM | POA: Insufficient documentation

## 2017-07-14 DIAGNOSIS — E039 Hypothyroidism, unspecified: Secondary | ICD-10-CM | POA: Insufficient documentation

## 2017-07-14 DIAGNOSIS — E119 Type 2 diabetes mellitus without complications: Secondary | ICD-10-CM | POA: Insufficient documentation

## 2017-07-14 DIAGNOSIS — M79604 Pain in right leg: Secondary | ICD-10-CM | POA: Diagnosis not present

## 2017-07-14 DIAGNOSIS — G8929 Other chronic pain: Secondary | ICD-10-CM | POA: Diagnosis present

## 2017-07-14 DIAGNOSIS — F17210 Nicotine dependence, cigarettes, uncomplicated: Secondary | ICD-10-CM | POA: Insufficient documentation

## 2017-07-14 DIAGNOSIS — K219 Gastro-esophageal reflux disease without esophagitis: Secondary | ICD-10-CM | POA: Diagnosis not present

## 2017-07-14 DIAGNOSIS — M810 Age-related osteoporosis without current pathological fracture: Secondary | ICD-10-CM | POA: Diagnosis not present

## 2017-07-14 MED ORDER — HYDROCODONE-ACETAMINOPHEN 10-325 MG PO TABS: 1.0000 | ORAL_TABLET | Freq: Three times a day (TID) | ORAL | 0 refills | Status: DC

## 2017-07-14 NOTE — Patient Instructions (Signed)
Hip injection today , f/u with Zella Ball next month

## 2017-07-14 NOTE — Progress Notes (Signed)
Trochanteric bursa injection Left  With  ultrasound guidance  Indication Trochanteric bursitis. Exam has tenderness over the greater trochanter of the hip. Pain has not responded to conservative care such as exercise therapy and oral medications. Pain interferes with sleep or with mobility Informed consent was obtained after describing risks and benefits of the procedure with the patient these include bleeding bruising and infection. Patient has signed written consent form. Patient placed in a lateral decubitus position with the affected hip superior. Short axis view of greater trochanter , inplane approach , needle tip approximated post lateral aspect of troch jus t off periosteum.Needle slightly withdrawn then 6mg of betamethasone with 4 cc 1% lidocaine were injected. Patient tolerated procedure well. Post procedure instructions given.  

## 2017-07-15 ENCOUNTER — Other Ambulatory Visit: Payer: Self-pay | Admitting: Physician Assistant

## 2017-07-15 ENCOUNTER — Encounter: Payer: Self-pay | Admitting: Internal Medicine

## 2017-07-16 ENCOUNTER — Ambulatory Visit: Payer: Medicaid Other | Admitting: Physician Assistant

## 2017-07-16 ENCOUNTER — Encounter: Payer: Self-pay | Admitting: Physician Assistant

## 2017-07-16 ENCOUNTER — Other Ambulatory Visit: Payer: Self-pay

## 2017-07-16 VITALS — BP 110/78 | HR 103 | Temp 98.2°F | Resp 16

## 2017-07-16 DIAGNOSIS — M810 Age-related osteoporosis without current pathological fracture: Secondary | ICD-10-CM | POA: Diagnosis not present

## 2017-07-16 DIAGNOSIS — R300 Dysuria: Secondary | ICD-10-CM | POA: Diagnosis not present

## 2017-07-16 DIAGNOSIS — M7062 Trochanteric bursitis, left hip: Secondary | ICD-10-CM

## 2017-07-16 DIAGNOSIS — F17200 Nicotine dependence, unspecified, uncomplicated: Secondary | ICD-10-CM

## 2017-07-16 DIAGNOSIS — Z114 Encounter for screening for human immunodeficiency virus [HIV]: Secondary | ICD-10-CM

## 2017-07-16 DIAGNOSIS — K219 Gastro-esophageal reflux disease without esophagitis: Secondary | ICD-10-CM

## 2017-07-16 DIAGNOSIS — Z23 Encounter for immunization: Secondary | ICD-10-CM

## 2017-07-16 DIAGNOSIS — J439 Emphysema, unspecified: Secondary | ICD-10-CM

## 2017-07-16 DIAGNOSIS — M7540 Impingement syndrome of unspecified shoulder: Secondary | ICD-10-CM | POA: Diagnosis not present

## 2017-07-16 DIAGNOSIS — E119 Type 2 diabetes mellitus without complications: Secondary | ICD-10-CM

## 2017-07-16 DIAGNOSIS — M7502 Adhesive capsulitis of left shoulder: Secondary | ICD-10-CM

## 2017-07-16 DIAGNOSIS — I3139 Other pericardial effusion (noninflammatory): Secondary | ICD-10-CM

## 2017-07-16 DIAGNOSIS — E039 Hypothyroidism, unspecified: Secondary | ICD-10-CM | POA: Diagnosis not present

## 2017-07-16 DIAGNOSIS — E785 Hyperlipidemia, unspecified: Secondary | ICD-10-CM

## 2017-07-16 DIAGNOSIS — I313 Pericardial effusion (noninflammatory): Secondary | ICD-10-CM

## 2017-07-16 DIAGNOSIS — N390 Urinary tract infection, site not specified: Secondary | ICD-10-CM

## 2017-07-16 DIAGNOSIS — K760 Fatty (change of) liver, not elsewhere classified: Secondary | ICD-10-CM | POA: Diagnosis not present

## 2017-07-16 DIAGNOSIS — M5416 Radiculopathy, lumbar region: Secondary | ICD-10-CM | POA: Diagnosis not present

## 2017-07-16 MED ORDER — CIPROFLOXACIN HCL 500 MG PO TABS: 500.0000 mg | ORAL_TABLET | Freq: Two times a day (BID) | ORAL | 0 refills | Status: AC

## 2017-07-16 NOTE — Addendum Note (Signed)
Addended by: Dena Billet B on: 07/16/2017 02:10 PM   Modules accepted: Orders
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
## 2017-07-16 NOTE — Addendum Note (Signed)
Addended by: Vonna Kotyk A on: 07/16/2017 05:05 PM   Modules accepted: Orders

## 2017-07-17 ENCOUNTER — Ambulatory Visit: Payer: Medicaid Other | Admitting: Cardiology

## 2017-07-17 ENCOUNTER — Encounter: Payer: Self-pay | Admitting: Cardiology

## 2017-07-17 VITALS — BP 108/74 | HR 100 | Ht 65.0 in | Wt 299.0 lb

## 2017-07-17 DIAGNOSIS — I313 Pericardial effusion (noninflammatory): Secondary | ICD-10-CM

## 2017-07-17 DIAGNOSIS — I700 Atherosclerosis of aorta: Secondary | ICD-10-CM | POA: Diagnosis not present

## 2017-07-17 DIAGNOSIS — I3139 Other pericardial effusion (noninflammatory): Secondary | ICD-10-CM

## 2017-07-17 LAB — URINALYSIS, ROUTINE W REFLEX MICROSCOPIC
Bilirubin Urine: NEGATIVE
GLUCOSE, UA: NEGATIVE
HGB URINE DIPSTICK: NEGATIVE
NITRITE: POSITIVE — AB
PH: 6 (ref 5.0–8.0)
Protein, ur: NEGATIVE
RBC / HPF: NONE SEEN /HPF (ref 0–2)
Specific Gravity, Urine: 1.032 (ref 1.001–1.03)

## 2017-07-17 LAB — COMPLETE METABOLIC PANEL WITH GFR
AG RATIO: 1.3 (calc) (ref 1.0–2.5)
ALBUMIN MSPROF: 3.9 g/dL (ref 3.6–5.1)
ALKALINE PHOSPHATASE (APISO): 59 U/L (ref 33–130)
ALT: 12 U/L (ref 6–29)
AST: 12 U/L (ref 10–35)
BUN / CREAT RATIO: 20 (calc) (ref 6–22)
BUN: 22 mg/dL (ref 7–25)
CO2: 30 mmol/L (ref 20–32)
CREATININE: 1.08 mg/dL — AB (ref 0.50–0.99)
Calcium: 9.1 mg/dL (ref 8.6–10.4)
Chloride: 101 mmol/L (ref 98–110)
GFR, EST NON AFRICAN AMERICAN: 55 mL/min/{1.73_m2} — AB (ref >=60)
GFR, Est African American: 64 mL/min/{1.73_m2} (ref >=60)
GLOBULIN: 3.1 g/dL (ref 1.9–3.7)
Glucose, Bld: 123 mg/dL — ABNORMAL HIGH (ref 65–99)
Potassium: 5 mmol/L (ref 3.5–5.3)
SODIUM: 140 mmol/L (ref 135–146)
Total Bilirubin: 0.3 mg/dL (ref 0.2–1.2)
Total Protein: 7 g/dL (ref 6.1–8.1)

## 2017-07-17 LAB — LIPID PANEL
CHOL/HDL RATIO: 4.6 (calc) (ref <5.0)
CHOLESTEROL: 142 mg/dL (ref <200)
HDL: 31 mg/dL — ABNORMAL LOW (ref >50)
LDL Cholesterol (Calc): 73 mg/dL (calc)
NON-HDL CHOLESTEROL (CALC): 111 mg/dL (ref <130)
Triglycerides: 287 mg/dL — ABNORMAL HIGH (ref <150)

## 2017-07-17 LAB — HEMOGLOBIN A1C
EAG (MMOL/L): 7.4 (calc)
Hgb A1c MFr Bld: 6.3 % of total Hgb — ABNORMAL HIGH (ref <5.7)
MEAN PLASMA GLUCOSE: 134 (calc)

## 2017-07-17 LAB — MICROSCOPIC MESSAGE

## 2017-07-17 LAB — HIV ANTIBODY (ROUTINE TESTING W REFLEX): HIV 1&2 Ab, 4th Generation: NONREACTIVE

## 2017-07-17 LAB — TSH: TSH: 2.63 mIU/L (ref 0.40–4.50)
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
## 2017-07-17 NOTE — Patient Instructions (Signed)
Medication Instructions:  Your physician recommends that you continue on your current medications as directed. Please refer to the Current Medication list given to you today.   Labwork: none  Testing/Procedures: Your physician has requested that you have an echocardiogram. Echocardiography is a painless test that uses sound waves to create images of your heart. It provides your doctor with information about the size and shape of your heart and how well your heart's chambers and valves are working. This procedure takes approximately one hour. There are no restrictions for this procedure.    Follow-Up: Your physician recommends that you schedule a follow-up appointment in: pending echo results   Any Other Special Instructions Will Be Listed Below (If Applicable).     If you need a refill on your cardiac medications before your next appointment, please call your pharmacy.   

## 2017-07-19 LAB — URINE CULTURE
MICRO NUMBER:: 81258558
SPECIMEN QUALITY:: ADEQUATE

## 2017-07-20 ENCOUNTER — Ambulatory Visit (INDEPENDENT_AMBULATORY_CARE_PROVIDER_SITE_OTHER): Payer: Medicaid Other

## 2017-07-20 DIAGNOSIS — R1011 Right upper quadrant pain: Secondary | ICD-10-CM

## 2017-07-21 ENCOUNTER — Other Ambulatory Visit: Payer: Self-pay | Admitting: Physician Assistant

## 2017-07-21 LAB — IFOBT (OCCULT BLOOD): IFOBT: NEGATIVE

## 2017-07-22 ENCOUNTER — Encounter: Payer: Self-pay | Admitting: Cardiology

## 2017-07-23 ENCOUNTER — Ambulatory Visit (HOSPITAL_COMMUNITY): Payer: Medicaid Other | Attending: Cardiology

## 2017-07-27 ENCOUNTER — Other Ambulatory Visit: Payer: Self-pay | Admitting: Physician Assistant

## 2017-07-27 ENCOUNTER — Other Ambulatory Visit: Payer: Self-pay | Admitting: Registered Nurse

## 2017-07-28 NOTE — Progress Notes (Signed)
Her ifobt was negative.  Still need her to have CBC, ferritin/iron/tibc that I requested. Recent labs done by PCP did not include those.  Keep upcoming ov with rmr to decide next step in evaluation of her IDA, abd pain ,fatty liver.

## 2017-08-03 ENCOUNTER — Other Ambulatory Visit (HOSPITAL_COMMUNITY): Payer: Medicaid Other

## 2017-08-03 ENCOUNTER — Other Ambulatory Visit: Payer: Self-pay

## 2017-08-03 DIAGNOSIS — R1010 Upper abdominal pain, unspecified: Secondary | ICD-10-CM

## 2017-08-04 ENCOUNTER — Other Ambulatory Visit: Payer: Self-pay | Admitting: Physician Assistant

## 2017-08-05 ENCOUNTER — Ambulatory Visit (HOSPITAL_COMMUNITY)
Admission: RE | Admit: 2017-08-05 | Discharge: 2017-08-05 | Disposition: A | Discharge status: Home / Self Care | Payer: Medicaid Other | Source: Ambulatory Visit | Attending: Cardiology | Admitting: Cardiology

## 2017-08-05 DIAGNOSIS — I313 Pericardial effusion (noninflammatory): Secondary | ICD-10-CM | POA: Diagnosis not present

## 2017-08-05 DIAGNOSIS — Z720 Tobacco use: Secondary | ICD-10-CM | POA: Diagnosis not present

## 2017-08-05 DIAGNOSIS — E119 Type 2 diabetes mellitus without complications: Secondary | ICD-10-CM | POA: Diagnosis not present

## 2017-08-05 DIAGNOSIS — J449 Chronic obstructive pulmonary disease, unspecified: Secondary | ICD-10-CM | POA: Insufficient documentation

## 2017-08-05 DIAGNOSIS — E785 Hyperlipidemia, unspecified: Secondary | ICD-10-CM | POA: Insufficient documentation

## 2017-08-05 DIAGNOSIS — I3139 Other pericardial effusion (noninflammatory): Secondary | ICD-10-CM

## 2017-08-05 NOTE — Progress Notes (Signed)
*  PRELIMINARY RESULTS* Echocardiogram 2D Echocardiogram has been performed.  Leavy Cella 08/05/2017, 1:33 PM

## 2017-08-06 ENCOUNTER — Telehealth: Payer: Self-pay

## 2017-08-06 ENCOUNTER — Telehealth: Payer: Self-pay | Admitting: *Deleted

## 2017-08-06 LAB — CBC WITH DIFFERENTIAL/PLATELET
Basophils Absolute: 54 cells/uL (ref 0–200)
Basophils Relative: 0.4 %
EOS PCT: 1.9 %
Eosinophils Absolute: 257 cells/uL (ref 15–500)
HCT: 41.2 % (ref 35.0–45.0)
Hemoglobin: 12.4 g/dL (ref 11.7–15.5)
LYMPHS ABS: 3267 {cells}/uL (ref 850–3900)
MCH: 22.8 pg — ABNORMAL LOW (ref 27.0–33.0)
MCHC: 30.1 g/dL — AB (ref 32.0–36.0)
MCV: 75.9 fL — ABNORMAL LOW (ref 80.0–100.0)
MPV: 9.8 fL (ref 7.5–12.5)
Monocytes Relative: 7.5 %
NEUTROS PCT: 66 %
Neutro Abs: 8910 cells/uL — ABNORMAL HIGH (ref 1500–7800)
Platelets: 432 10*3/uL — ABNORMAL HIGH (ref 140–400)
RBC: 5.43 10*6/uL — AB (ref 3.80–5.10)
RDW: 18 % — AB (ref 11.0–15.0)
TOTAL LYMPHOCYTE: 24.2 %
WBC mixed population: 1013 cells/uL — ABNORMAL HIGH (ref 200–950)
WBC: 13.5 10*3/uL — AB (ref 3.8–10.8)

## 2017-08-06 LAB — IRON,TIBC AND FERRITIN PANEL
%SAT: 8 % — AB (ref 11–50)
Ferritin: 7 ng/mL — ABNORMAL LOW (ref 20–288)
IRON: 42 ug/dL — AB (ref 45–160)
TIBC: 528 mcg/dL (calc) — ABNORMAL HIGH (ref 250–450)

## 2017-08-06 NOTE — Telephone Encounter (Signed)
Called patient with test results. No answer. Left message to call back.  

## 2017-08-06 NOTE — Telephone Encounter (Signed)
-----  Message from Drema Dallas, [place] sent at 08/06/2017  4:16 PM EST ----- PLEASE CALL THIS LADY TOMORROW-  ----- Message ----- From: Arnoldo Lenis, MD Sent: 08/06/2017  10:02 AM To: Drema Dallas, CMA  Echo does show a moderate amount of fluid around the heart. We need to check some additional blood tests in looking into the cause. Please order an ANA, ESR, and CRP. The fluid is something for now we will conitnue to monitor, needs a repeat limited echo for pericardial effusion in 1 month  Zandra Abts MD

## 2017-08-06 NOTE — Telephone Encounter (Signed)
Called pt. No answer, left message for pt to return call.  

## 2017-08-06 NOTE — Telephone Encounter (Signed)
-----  Message from Arnoldo Lenis, MD sent at 08/06/2017 10:02 AM EST ----- Echo does show a moderate amount of fluid around the heart. We need to check some additional blood tests in looking into the cause. Please order an ANA, ESR, and CRP. The fluid is something for now we will conitnue to monitor, needs a repeat limited echo for pericardial effusion in 1 month  Zandra Abts MD

## 2017-08-07 ENCOUNTER — Ambulatory Visit: Payer: Medicaid Other | Admitting: Internal Medicine

## 2017-08-07 ENCOUNTER — Telehealth: Payer: Self-pay | Admitting: *Deleted

## 2017-08-07 DIAGNOSIS — I313 Pericardial effusion (noninflammatory): Secondary | ICD-10-CM

## 2017-08-07 DIAGNOSIS — I3139 Other pericardial effusion (noninflammatory): Secondary | ICD-10-CM

## 2017-08-07 NOTE — Addendum Note (Signed)
Addended by: Levonne Hubert on: 08/07/2017 09:27 AM   Modules accepted: Orders

## 2017-08-07 NOTE — Telephone Encounter (Signed)
-----  Message from Lisa R McGhee, CMA sent at 08/06/2017  4:16 PM EST ----- PLEASE CALL THIS LADY TOMORROW-  ----- Message ----- From: Branch, Jonathan F, MD Sent: 08/06/2017  10:02 AM To: Lisa R McGhee, CMA  Echo does show a moderate amount of fluid around the heart. We need to check some additional blood tests in looking into the cause. Please order an ANA, ESR, and CRP. The fluid is something for now we will conitnue to monitor, needs a repeat limited echo for pericardial effusion in 1 month  J Branch MD  

## 2017-08-07 NOTE — Telephone Encounter (Signed)
Patient notified and voiced understanding. All orders placed.

## 2017-08-13 ENCOUNTER — Encounter: Payer: Medicaid Other | Attending: Physical Medicine and Rehabilitation | Admitting: Registered Nurse

## 2017-08-13 ENCOUNTER — Encounter: Payer: Self-pay | Admitting: Registered Nurse

## 2017-08-13 VITALS — BP 136/92 | HR 96 | Resp 14

## 2017-08-13 DIAGNOSIS — M546 Pain in thoracic spine: Secondary | ICD-10-CM

## 2017-08-13 DIAGNOSIS — Z5181 Encounter for therapeutic drug level monitoring: Secondary | ICD-10-CM | POA: Diagnosis not present

## 2017-08-13 DIAGNOSIS — G8929 Other chronic pain: Secondary | ICD-10-CM

## 2017-08-13 DIAGNOSIS — F17210 Nicotine dependence, cigarettes, uncomplicated: Secondary | ICD-10-CM | POA: Insufficient documentation

## 2017-08-13 DIAGNOSIS — Z720 Tobacco use: Secondary | ICD-10-CM | POA: Diagnosis not present

## 2017-08-13 DIAGNOSIS — M961 Postlaminectomy syndrome, not elsewhere classified: Secondary | ICD-10-CM | POA: Diagnosis not present

## 2017-08-13 DIAGNOSIS — M75101 Unspecified rotator cuff tear or rupture of right shoulder, not specified as traumatic: Secondary | ICD-10-CM | POA: Diagnosis not present

## 2017-08-13 DIAGNOSIS — M25512 Pain in left shoulder: Secondary | ICD-10-CM | POA: Insufficient documentation

## 2017-08-13 DIAGNOSIS — M47814 Spondylosis without myelopathy or radiculopathy, thoracic region: Secondary | ICD-10-CM | POA: Insufficient documentation

## 2017-08-13 DIAGNOSIS — E785 Hyperlipidemia, unspecified: Secondary | ICD-10-CM | POA: Diagnosis not present

## 2017-08-13 DIAGNOSIS — K219 Gastro-esophageal reflux disease without esophagitis: Secondary | ICD-10-CM | POA: Insufficient documentation

## 2017-08-13 DIAGNOSIS — Z79899 Other long term (current) drug therapy: Secondary | ICD-10-CM | POA: Diagnosis not present

## 2017-08-13 DIAGNOSIS — J449 Chronic obstructive pulmonary disease, unspecified: Secondary | ICD-10-CM | POA: Insufficient documentation

## 2017-08-13 DIAGNOSIS — M810 Age-related osteoporosis without current pathological fracture: Secondary | ICD-10-CM | POA: Insufficient documentation

## 2017-08-13 DIAGNOSIS — M5416 Radiculopathy, lumbar region: Secondary | ICD-10-CM

## 2017-08-13 DIAGNOSIS — M47816 Spondylosis without myelopathy or radiculopathy, lumbar region: Secondary | ICD-10-CM | POA: Insufficient documentation

## 2017-08-13 DIAGNOSIS — G894 Chronic pain syndrome: Secondary | ICD-10-CM | POA: Diagnosis not present

## 2017-08-13 DIAGNOSIS — M7061 Trochanteric bursitis, right hip: Secondary | ICD-10-CM | POA: Diagnosis not present

## 2017-08-13 DIAGNOSIS — E039 Hypothyroidism, unspecified: Secondary | ICD-10-CM | POA: Diagnosis not present

## 2017-08-13 DIAGNOSIS — M25551 Pain in right hip: Secondary | ICD-10-CM | POA: Insufficient documentation

## 2017-08-13 DIAGNOSIS — IMO0002 Reserved for concepts with insufficient information to code with codable children: Secondary | ICD-10-CM

## 2017-08-13 DIAGNOSIS — E119 Type 2 diabetes mellitus without complications: Secondary | ICD-10-CM | POA: Insufficient documentation

## 2017-08-13 DIAGNOSIS — M79604 Pain in right leg: Secondary | ICD-10-CM | POA: Diagnosis not present

## 2017-08-13 MED ORDER — HYDROCODONE-ACETAMINOPHEN 10-325 MG PO TABS: 1.0000 | ORAL_TABLET | Freq: Three times a day (TID) | ORAL | 0 refills | Status: DC
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
## 2017-08-17 NOTE — Progress Notes (Signed)
Stable Hgb but with iron deficiency. Recently ifobt negative. WBC consistently mildly elevated.   Please have her reschedule her ov with RMR only (Dx: iron deficiency, leukocytosis)

## 2017-08-18 ENCOUNTER — Ambulatory Visit: Payer: Medicaid Other | Admitting: Urology

## 2017-08-20 ENCOUNTER — Other Ambulatory Visit: Payer: Self-pay | Admitting: Physician Assistant

## 2017-08-20 ENCOUNTER — Encounter: Payer: Self-pay | Admitting: Internal Medicine

## 2017-08-20 DIAGNOSIS — R0602 Shortness of breath: Secondary | ICD-10-CM

## 2017-08-20 NOTE — Progress Notes (Signed)
PATIENT SCHEDULED  °

## 2017-08-24 ENCOUNTER — Telehealth: Payer: Self-pay

## 2017-08-24 NOTE — Telephone Encounter (Signed)
Provided verbal to Judson Roch

## 2017-08-24 NOTE — Telephone Encounter (Signed)
[facility] with [facility] is requesting verbal orders to continue to see patient once every 2 weeks under caps program

## 2017-08-24 NOTE — Telephone Encounter (Signed)
Approved.  

## 2017-08-28 ENCOUNTER — Other Ambulatory Visit: Payer: Self-pay | Admitting: Physician Assistant

## 2017-09-03 ENCOUNTER — Ambulatory Visit (HOSPITAL_COMMUNITY)
Admission: RE | Admit: 2017-09-03 | Discharge: 2017-09-03 | Disposition: A | Discharge status: Home / Self Care | Payer: Medicaid Other | Source: Ambulatory Visit | Attending: Cardiology | Admitting: Cardiology

## 2017-09-03 DIAGNOSIS — I3139 Other pericardial effusion (noninflammatory): Secondary | ICD-10-CM

## 2017-09-03 DIAGNOSIS — I313 Pericardial effusion (noninflammatory): Secondary | ICD-10-CM | POA: Insufficient documentation

## 2017-09-03 NOTE — Progress Notes (Signed)
*  PRELIMINARY RESULTS* Echocardiogram Limited 2D Echocardiogram has been performed.  Lisa Ewing 09/03/2017, 1:21 PM

## 2017-09-04 ENCOUNTER — Other Ambulatory Visit: Payer: Self-pay | Admitting: Registered Nurse

## 2017-09-04 ENCOUNTER — Other Ambulatory Visit: Payer: Self-pay | Admitting: Physician Assistant

## 2017-09-04 DIAGNOSIS — I3131 Malignant pericardial effusion in diseases classified elsewhere: Secondary | ICD-10-CM

## 2017-09-04 DIAGNOSIS — C801 Malignant (primary) neoplasm, unspecified: Secondary | ICD-10-CM

## 2017-09-04 DIAGNOSIS — I313 Pericardial effusion (noninflammatory): Principal | ICD-10-CM

## 2017-09-04 NOTE — Telephone Encounter (Addendum)
Spoke with pt. She states she has no new symptoms. She feels good overall. She has not had any new SOB. She will repeat echo in 6 weeks. She will have labs done as she has not at this time. She stated she forgot.

## 2017-09-04 NOTE — Telephone Encounter (Signed)
-----   Message from Arnoldo Lenis, MD sent at 09/04/2017 12:16 PM EST ----- Echo shows fairly stable amount of fluid around the heart. Did she have her blood work since last visit? Any new symptoms? Please repeat limited echo for pericardial effusion in 6 weeks  Zandra Abts MD

## 2017-09-09 NOTE — Telephone Encounter (Signed)
-----   Message from Orinda Kenner sent at 09/09/2017 10:49 AM EST ----- Regarding: RE: limited echo  Can you put order in for this please?  Thanks, Coralyn Mark ----- Message ----- From: Drema Dallas, CMA Sent: 09/04/2017   2:09 PM To: Bertram Gala Goins Subject: limited echo                                   Repeat limited echo in 6 weeks. ( per JB )   Thanks,  Lattie Haw

## 2017-09-15 ENCOUNTER — Other Ambulatory Visit: Payer: Self-pay

## 2017-09-15 ENCOUNTER — Encounter: Payer: Self-pay | Admitting: Registered Nurse

## 2017-09-15 ENCOUNTER — Encounter: Payer: Medicaid Other | Attending: Physical Medicine and Rehabilitation | Admitting: Registered Nurse

## 2017-09-15 VITALS — BP 129/84 | HR 94

## 2017-09-15 DIAGNOSIS — M542 Cervicalgia: Secondary | ICD-10-CM

## 2017-09-15 DIAGNOSIS — M25512 Pain in left shoulder: Secondary | ICD-10-CM | POA: Insufficient documentation

## 2017-09-15 DIAGNOSIS — Z720 Tobacco use: Secondary | ICD-10-CM

## 2017-09-15 DIAGNOSIS — M5416 Radiculopathy, lumbar region: Secondary | ICD-10-CM | POA: Diagnosis not present

## 2017-09-15 DIAGNOSIS — G894 Chronic pain syndrome: Secondary | ICD-10-CM

## 2017-09-15 DIAGNOSIS — M546 Pain in thoracic spine: Secondary | ICD-10-CM | POA: Diagnosis not present

## 2017-09-15 DIAGNOSIS — E785 Hyperlipidemia, unspecified: Secondary | ICD-10-CM | POA: Insufficient documentation

## 2017-09-15 DIAGNOSIS — Z79899 Other long term (current) drug therapy: Secondary | ICD-10-CM

## 2017-09-15 DIAGNOSIS — K219 Gastro-esophageal reflux disease without esophagitis: Secondary | ICD-10-CM | POA: Insufficient documentation

## 2017-09-15 DIAGNOSIS — E119 Type 2 diabetes mellitus without complications: Secondary | ICD-10-CM | POA: Diagnosis not present

## 2017-09-15 DIAGNOSIS — Z5181 Encounter for therapeutic drug level monitoring: Secondary | ICD-10-CM

## 2017-09-15 DIAGNOSIS — M47816 Spondylosis without myelopathy or radiculopathy, lumbar region: Secondary | ICD-10-CM | POA: Diagnosis not present

## 2017-09-15 DIAGNOSIS — M5412 Radiculopathy, cervical region: Secondary | ICD-10-CM

## 2017-09-15 DIAGNOSIS — E039 Hypothyroidism, unspecified: Secondary | ICD-10-CM | POA: Insufficient documentation

## 2017-09-15 DIAGNOSIS — M47814 Spondylosis without myelopathy or radiculopathy, thoracic region: Secondary | ICD-10-CM | POA: Diagnosis not present

## 2017-09-15 DIAGNOSIS — M810 Age-related osteoporosis without current pathological fracture: Secondary | ICD-10-CM | POA: Insufficient documentation

## 2017-09-15 DIAGNOSIS — G8929 Other chronic pain: Secondary | ICD-10-CM | POA: Insufficient documentation

## 2017-09-15 DIAGNOSIS — IMO0002 Reserved for concepts with insufficient information to code with codable children: Secondary | ICD-10-CM

## 2017-09-15 DIAGNOSIS — M75101 Unspecified rotator cuff tear or rupture of right shoulder, not specified as traumatic: Secondary | ICD-10-CM

## 2017-09-15 DIAGNOSIS — F17210 Nicotine dependence, cigarettes, uncomplicated: Secondary | ICD-10-CM | POA: Insufficient documentation

## 2017-09-15 DIAGNOSIS — M79604 Pain in right leg: Secondary | ICD-10-CM | POA: Diagnosis not present

## 2017-09-15 DIAGNOSIS — J449 Chronic obstructive pulmonary disease, unspecified: Secondary | ICD-10-CM | POA: Insufficient documentation

## 2017-09-15 DIAGNOSIS — M961 Postlaminectomy syndrome, not elsewhere classified: Secondary | ICD-10-CM

## 2017-09-15 DIAGNOSIS — M62830 Muscle spasm of back: Secondary | ICD-10-CM

## 2017-09-15 DIAGNOSIS — M25551 Pain in right hip: Secondary | ICD-10-CM | POA: Diagnosis not present

## 2017-09-15 DIAGNOSIS — M7061 Trochanteric bursitis, right hip: Secondary | ICD-10-CM

## 2017-09-15 DIAGNOSIS — R0902 Hypoxemia: Secondary | ICD-10-CM

## 2017-09-15 DIAGNOSIS — M7542 Impingement syndrome of left shoulder: Secondary | ICD-10-CM

## 2017-09-15 MED ORDER — HYDROCODONE-ACETAMINOPHEN 10-325 MG PO TABS: 1.0000 | ORAL_TABLET | Freq: Three times a day (TID) | ORAL | 0 refills | Status: DC
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
## 2017-09-18 ENCOUNTER — Other Ambulatory Visit: Payer: Self-pay | Admitting: Physician Assistant

## 2017-09-22 ENCOUNTER — Other Ambulatory Visit: Payer: Self-pay | Admitting: Physician Assistant

## 2017-09-22 NOTE — Telephone Encounter (Signed)
Refill appropriate 

## 2017-10-06 ENCOUNTER — Other Ambulatory Visit: Payer: Self-pay | Admitting: Physician Assistant

## 2017-10-09 ENCOUNTER — Ambulatory Visit: Payer: Medicaid Other | Admitting: Internal Medicine

## 2017-10-14 ENCOUNTER — Ambulatory Visit (HOSPITAL_COMMUNITY): Payer: Medicaid Other

## 2017-10-15 ENCOUNTER — Other Ambulatory Visit: Payer: Self-pay

## 2017-10-15 ENCOUNTER — Encounter: Payer: Medicaid Other | Attending: Physical Medicine and Rehabilitation | Admitting: Registered Nurse

## 2017-10-15 ENCOUNTER — Encounter: Payer: Self-pay | Admitting: Registered Nurse

## 2017-10-15 VITALS — BP 126/73 | HR 105

## 2017-10-15 DIAGNOSIS — M7062 Trochanteric bursitis, left hip: Secondary | ICD-10-CM

## 2017-10-15 DIAGNOSIS — Z720 Tobacco use: Secondary | ICD-10-CM

## 2017-10-15 DIAGNOSIS — M7542 Impingement syndrome of left shoulder: Secondary | ICD-10-CM

## 2017-10-15 DIAGNOSIS — J449 Chronic obstructive pulmonary disease, unspecified: Secondary | ICD-10-CM | POA: Insufficient documentation

## 2017-10-15 DIAGNOSIS — Z5181 Encounter for therapeutic drug level monitoring: Secondary | ICD-10-CM | POA: Diagnosis not present

## 2017-10-15 DIAGNOSIS — M25512 Pain in left shoulder: Secondary | ICD-10-CM | POA: Diagnosis not present

## 2017-10-15 DIAGNOSIS — M5416 Radiculopathy, lumbar region: Secondary | ICD-10-CM

## 2017-10-15 DIAGNOSIS — M47814 Spondylosis without myelopathy or radiculopathy, thoracic region: Secondary | ICD-10-CM | POA: Diagnosis not present

## 2017-10-15 DIAGNOSIS — M7061 Trochanteric bursitis, right hip: Secondary | ICD-10-CM

## 2017-10-15 DIAGNOSIS — M810 Age-related osteoporosis without current pathological fracture: Secondary | ICD-10-CM | POA: Insufficient documentation

## 2017-10-15 DIAGNOSIS — M5412 Radiculopathy, cervical region: Secondary | ICD-10-CM

## 2017-10-15 DIAGNOSIS — Z79899 Other long term (current) drug therapy: Secondary | ICD-10-CM

## 2017-10-15 DIAGNOSIS — M47816 Spondylosis without myelopathy or radiculopathy, lumbar region: Secondary | ICD-10-CM | POA: Diagnosis not present

## 2017-10-15 DIAGNOSIS — K219 Gastro-esophageal reflux disease without esophagitis: Secondary | ICD-10-CM | POA: Diagnosis not present

## 2017-10-15 DIAGNOSIS — M75101 Unspecified rotator cuff tear or rupture of right shoulder, not specified as traumatic: Secondary | ICD-10-CM

## 2017-10-15 DIAGNOSIS — G894 Chronic pain syndrome: Secondary | ICD-10-CM

## 2017-10-15 DIAGNOSIS — M546 Pain in thoracic spine: Secondary | ICD-10-CM

## 2017-10-15 DIAGNOSIS — E785 Hyperlipidemia, unspecified: Secondary | ICD-10-CM | POA: Insufficient documentation

## 2017-10-15 DIAGNOSIS — M542 Cervicalgia: Secondary | ICD-10-CM

## 2017-10-15 DIAGNOSIS — IMO0002 Reserved for concepts with insufficient information to code with codable children: Secondary | ICD-10-CM

## 2017-10-15 DIAGNOSIS — M79604 Pain in right leg: Secondary | ICD-10-CM | POA: Insufficient documentation

## 2017-10-15 DIAGNOSIS — M25551 Pain in right hip: Secondary | ICD-10-CM | POA: Diagnosis not present

## 2017-10-15 DIAGNOSIS — G8929 Other chronic pain: Secondary | ICD-10-CM | POA: Diagnosis present

## 2017-10-15 DIAGNOSIS — M961 Postlaminectomy syndrome, not elsewhere classified: Secondary | ICD-10-CM | POA: Diagnosis not present

## 2017-10-15 DIAGNOSIS — E039 Hypothyroidism, unspecified: Secondary | ICD-10-CM | POA: Diagnosis not present

## 2017-10-15 DIAGNOSIS — M62830 Muscle spasm of back: Secondary | ICD-10-CM

## 2017-10-15 DIAGNOSIS — F17210 Nicotine dependence, cigarettes, uncomplicated: Secondary | ICD-10-CM | POA: Diagnosis not present

## 2017-10-15 DIAGNOSIS — E119 Type 2 diabetes mellitus without complications: Secondary | ICD-10-CM | POA: Insufficient documentation

## 2017-10-15 MED ORDER — HYDROCODONE-ACETAMINOPHEN 10-325 MG PO TABS: 1.0000 | ORAL_TABLET | Freq: Three times a day (TID) | ORAL | 0 refills | Status: DC
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
## 2017-10-20 ENCOUNTER — Other Ambulatory Visit: Payer: Self-pay | Admitting: Physician Assistant

## 2017-10-20 ENCOUNTER — Ambulatory Visit: Payer: Medicaid Other | Admitting: Internal Medicine

## 2017-10-20 LAB — TOXASSURE SELECT,+ANTIDEPR,UR

## 2017-10-20 NOTE — Telephone Encounter (Signed)
Refill appropriate 

## 2017-10-21 ENCOUNTER — Ambulatory Visit: Payer: Medicaid Other | Admitting: Physician Assistant

## 2017-10-21 ENCOUNTER — Encounter: Payer: Self-pay | Admitting: Physician Assistant

## 2017-10-21 VITALS — BP 124/78 | HR 101 | Temp 98.0°F | Resp 18

## 2017-10-21 DIAGNOSIS — R300 Dysuria: Secondary | ICD-10-CM

## 2017-10-21 DIAGNOSIS — M5416 Radiculopathy, lumbar region: Secondary | ICD-10-CM

## 2017-10-21 DIAGNOSIS — E785 Hyperlipidemia, unspecified: Secondary | ICD-10-CM | POA: Diagnosis not present

## 2017-10-21 DIAGNOSIS — B370 Candidal stomatitis: Secondary | ICD-10-CM | POA: Diagnosis not present

## 2017-10-21 DIAGNOSIS — M25511 Pain in right shoulder: Secondary | ICD-10-CM

## 2017-10-21 DIAGNOSIS — K219 Gastro-esophageal reflux disease without esophagitis: Secondary | ICD-10-CM

## 2017-10-21 DIAGNOSIS — J439 Emphysema, unspecified: Secondary | ICD-10-CM | POA: Diagnosis not present

## 2017-10-21 DIAGNOSIS — J029 Acute pharyngitis, unspecified: Secondary | ICD-10-CM

## 2017-10-21 DIAGNOSIS — G8929 Other chronic pain: Secondary | ICD-10-CM

## 2017-10-21 DIAGNOSIS — E039 Hypothyroidism, unspecified: Secondary | ICD-10-CM | POA: Diagnosis not present

## 2017-10-21 DIAGNOSIS — M25512 Pain in left shoulder: Secondary | ICD-10-CM

## 2017-10-21 DIAGNOSIS — E119 Type 2 diabetes mellitus without complications: Secondary | ICD-10-CM

## 2017-10-21 DIAGNOSIS — F17200 Nicotine dependence, unspecified, uncomplicated: Secondary | ICD-10-CM

## 2017-10-21 DIAGNOSIS — M542 Cervicalgia: Secondary | ICD-10-CM

## 2017-10-21 LAB — URINALYSIS, ROUTINE W REFLEX MICROSCOPIC
Bilirubin Urine: NEGATIVE
Glucose, UA: NEGATIVE
Nitrite: POSITIVE — AB
Specific Gravity, Urine: >=1.03 (ref 1.001–1.03)
pH: 5 (ref 5.0–8.0)

## 2017-10-21 LAB — MICROSCOPIC MESSAGE

## 2017-10-21 MED ORDER — NYSTATIN 100000 UNIT/ML MT SUSP: 5.0000 mL | Freq: Four times a day (QID) | OROMUCOSAL | 0 refills | Status: DC
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
## 2017-10-22 ENCOUNTER — Telehealth: Payer: Self-pay | Admitting: *Deleted

## 2017-10-22 ENCOUNTER — Other Ambulatory Visit: Payer: Self-pay

## 2017-10-22 LAB — HEMOGLOBIN A1C
Hgb A1c MFr Bld: 6.7 % of total Hgb — ABNORMAL HIGH (ref <5.7)
Mean Plasma Glucose: 146 (calc)
eAG (mmol/L): 8.1 (calc)

## 2017-10-22 MED ORDER — CIPROFLOXACIN HCL 500 MG PO TABS: 500.0000 mg | ORAL_TABLET | Freq: Two times a day (BID) | ORAL | 0 refills | Status: DC

## 2017-10-22 NOTE — Telephone Encounter (Signed)
Urine drug screen for this encounter is consistent for prescribed medication 

## 2017-10-23 LAB — CULTURE, GROUP A STREP
MICRO NUMBER:: 90192959
SPECIMEN QUALITY: ADEQUATE

## 2017-10-23 LAB — STREP GROUP A AG, W/REFLEX TO CULT: Streptococcus, Group A Screen (Direct): NOT DETECTED

## 2017-10-24 LAB — URINE CULTURE
MICRO NUMBER:: 90193257
SPECIMEN QUALITY: ADEQUATE

## 2017-10-27 ENCOUNTER — Other Ambulatory Visit: Payer: Self-pay | Admitting: Physician Assistant

## 2017-10-27 NOTE — Telephone Encounter (Signed)
Refill appropriate 

## 2017-10-28 ENCOUNTER — Other Ambulatory Visit (HOSPITAL_COMMUNITY): Payer: Medicaid Other

## 2017-10-29 ENCOUNTER — Other Ambulatory Visit: Payer: Self-pay | Admitting: Physician Assistant

## 2017-10-29 ENCOUNTER — Other Ambulatory Visit: Payer: Self-pay

## 2017-10-29 MED ORDER — AMOXICILLIN-POT CLAVULANATE 875-125 MG PO TABS: 1.0000 | ORAL_TABLET | Freq: Two times a day (BID) | ORAL | 0 refills | Status: DC

## 2017-11-02 ENCOUNTER — Other Ambulatory Visit: Payer: Self-pay | Admitting: Physician Assistant

## 2017-11-02 ENCOUNTER — Telehealth: Payer: Self-pay

## 2017-11-02 NOTE — Telephone Encounter (Signed)
Patient was prescribed augmentin875-125 mg and is allergic to penicillin. Pharmacy sent over a fax requesting changes?

## 2017-11-02 NOTE — Telephone Encounter (Signed)
She is allergic to multiple medications including penicillin and sulfa. Her infection is resistant to multiple antibiotics. Therefore will use:  Fosfomycin 3 grams every 2 days for 3 doses.  Each dose-- 3 grams of powder---mix with 3 -4 ounces of cool water Dispense: 9 grams -- 0 refills.

## 2017-11-03 MED ORDER — FOSFOMYCIN TROMETHAMINE 3 G PO PACK: PACK | ORAL | 0 refills | Status: DC

## 2017-11-03 NOTE — Telephone Encounter (Signed)
New rx has been sent to pharmacy

## 2017-11-04 ENCOUNTER — Ambulatory Visit (HOSPITAL_COMMUNITY)
Admission: RE | Admit: 2017-11-04 | Discharge: 2017-11-04 | Disposition: A | Discharge status: Home / Self Care | Payer: Medicaid Other | Source: Ambulatory Visit | Attending: Cardiology | Admitting: Cardiology

## 2017-11-04 ENCOUNTER — Other Ambulatory Visit (HOSPITAL_COMMUNITY): Payer: Medicaid Other

## 2017-11-04 DIAGNOSIS — R0602 Shortness of breath: Secondary | ICD-10-CM | POA: Diagnosis not present

## 2017-11-04 DIAGNOSIS — I5189 Other ill-defined heart diseases: Secondary | ICD-10-CM | POA: Diagnosis not present

## 2017-11-04 DIAGNOSIS — F17200 Nicotine dependence, unspecified, uncomplicated: Secondary | ICD-10-CM | POA: Diagnosis not present

## 2017-11-04 DIAGNOSIS — I313 Pericardial effusion (noninflammatory): Secondary | ICD-10-CM | POA: Insufficient documentation

## 2017-11-04 DIAGNOSIS — J449 Chronic obstructive pulmonary disease, unspecified: Secondary | ICD-10-CM | POA: Diagnosis not present

## 2017-11-04 DIAGNOSIS — E669 Obesity, unspecified: Secondary | ICD-10-CM | POA: Diagnosis not present

## 2017-11-04 DIAGNOSIS — E119 Type 2 diabetes mellitus without complications: Secondary | ICD-10-CM | POA: Diagnosis not present

## 2017-11-04 DIAGNOSIS — K219 Gastro-esophageal reflux disease without esophagitis: Secondary | ICD-10-CM | POA: Diagnosis not present

## 2017-11-04 LAB — ECHOCARDIOGRAM LIMITED
AOPV: 0.83 m/s
AV Area mean vel: 2.72 cm2
AV Mean grad: 6 mmHg
AV Peak grad: 12 mmHg
AV VEL mean LVOT/AV: 0.87
AVA: 2.43 cm2
AVAREAMEANVIN: 1.06 cm2/m2
AVAREAVTI: 2.6 cm2
AVAREAVTIIND: 0.94 cm2/m2
AVPKVEL: 176 cm/s
CHL CUP AV PEAK INDEX: 1.01
CHL CUP AV VEL: 2.43
CHL CUP MV DEC (S): 313
DOP CAL AO MEAN VELOCITY: 105 cm/s
E/e' ratio: 9.85
EWDT: 313 ms
FS: 43 % (ref 28–44)
IV/PV OW: 0.88
LA diam end sys: 38 mm
LA diam index: 1.48 cm/m2
LA vol A4C: 89.5 ml
LA vol: 65.3 mL
LASIZE: 38 mm
LAVOLIN: 25.4 mL/m2
LDCA: 3.14 cm2
LV SIMPSON'S DISK: 59
LV TDI E'LATERAL: 9.9
LV dias vol: 55 mL (ref 46–106)
LV e' LATERAL: 9.9 cm/s
LV sys vol index: 9 mL/m2
LV sys vol: 23 mL
LVDIAVOLIN: 21 mL/m2
LVEEAVG: 9.85
LVEEMED: 9.85
LVOT SV: 81 mL
LVOT VTI: 25.9 cm
LVOT diameter: 20 mm
LVOT peak VTI: 0.77 cm
LVOT peak grad rest: 9 mmHg
LVOT peak vel: 146 cm/s
MV pk A vel: 117 m/s
MV pk E vel: 97.5 m/s
MVPG: 4 mmHg
PW: 13.9 mm — AB (ref 0.6–1.1)
RV LATERAL S' VELOCITY: 18.4 cm/s
Stroke v: 32 ml
TAPSE: 25.9 mm
TDI e' medial: 8.92
VTI: 33.5 cm
Valve area index: 0.94

## 2017-11-04 NOTE — Progress Notes (Signed)
*  PRELIMINARY RESULTS* Echocardiogram 2D Echocardiogram has been performed.  Lisa Ewing 11/04/2017, 11:36 AM

## 2017-11-09 ENCOUNTER — Other Ambulatory Visit: Payer: Self-pay

## 2017-11-10 ENCOUNTER — Ambulatory Visit: Payer: Medicaid Other | Admitting: Physical Medicine & Rehabilitation

## 2017-11-10 ENCOUNTER — Encounter: Payer: Medicaid Other | Attending: Physical Medicine and Rehabilitation

## 2017-11-10 ENCOUNTER — Encounter: Payer: Self-pay | Admitting: Physical Medicine & Rehabilitation

## 2017-11-10 ENCOUNTER — Telehealth: Payer: Self-pay

## 2017-11-10 VITALS — BP 132/72 | HR 104 | Resp 14

## 2017-11-10 DIAGNOSIS — M47814 Spondylosis without myelopathy or radiculopathy, thoracic region: Secondary | ICD-10-CM | POA: Diagnosis not present

## 2017-11-10 DIAGNOSIS — M25551 Pain in right hip: Secondary | ICD-10-CM | POA: Diagnosis not present

## 2017-11-10 DIAGNOSIS — K219 Gastro-esophageal reflux disease without esophagitis: Secondary | ICD-10-CM | POA: Diagnosis not present

## 2017-11-10 DIAGNOSIS — M47816 Spondylosis without myelopathy or radiculopathy, lumbar region: Secondary | ICD-10-CM | POA: Diagnosis not present

## 2017-11-10 DIAGNOSIS — J449 Chronic obstructive pulmonary disease, unspecified: Secondary | ICD-10-CM | POA: Diagnosis not present

## 2017-11-10 DIAGNOSIS — M810 Age-related osteoporosis without current pathological fracture: Secondary | ICD-10-CM | POA: Diagnosis not present

## 2017-11-10 DIAGNOSIS — I3139 Other pericardial effusion (noninflammatory): Secondary | ICD-10-CM

## 2017-11-10 DIAGNOSIS — G8929 Other chronic pain: Secondary | ICD-10-CM | POA: Insufficient documentation

## 2017-11-10 DIAGNOSIS — M7062 Trochanteric bursitis, left hip: Secondary | ICD-10-CM

## 2017-11-10 DIAGNOSIS — F17210 Nicotine dependence, cigarettes, uncomplicated: Secondary | ICD-10-CM | POA: Insufficient documentation

## 2017-11-10 DIAGNOSIS — E785 Hyperlipidemia, unspecified: Secondary | ICD-10-CM | POA: Insufficient documentation

## 2017-11-10 DIAGNOSIS — M25512 Pain in left shoulder: Secondary | ICD-10-CM | POA: Insufficient documentation

## 2017-11-10 DIAGNOSIS — E119 Type 2 diabetes mellitus without complications: Secondary | ICD-10-CM | POA: Insufficient documentation

## 2017-11-10 DIAGNOSIS — M79604 Pain in right leg: Secondary | ICD-10-CM | POA: Diagnosis not present

## 2017-11-10 DIAGNOSIS — I313 Pericardial effusion (noninflammatory): Secondary | ICD-10-CM

## 2017-11-10 DIAGNOSIS — E039 Hypothyroidism, unspecified: Secondary | ICD-10-CM | POA: Insufficient documentation

## 2017-11-10 MED ORDER — HYDROCODONE-ACETAMINOPHEN 10-325 MG PO TABS: 1.0000 | ORAL_TABLET | Freq: Three times a day (TID) | ORAL | 0 refills | Status: DC

## 2017-11-10 NOTE — Patient Instructions (Signed)
Will do shoulder injection net month

## 2017-11-10 NOTE — Progress Notes (Signed)
Trochanteric bursa injection Left  With  ultrasound guidance  Indication Trochanteric bursitis. Exam has tenderness over the greater trochanter of the hip. Pain has not responded to conservative care such as exercise therapy and oral medications. Pain interferes with sleep or with mobility Informed consent was obtained after describing risks and benefits of the procedure with the patient these include bleeding bruising and infection. Patient has signed written consent form. Patient placed in a lateral decubitus position with the affected hip superior. Short axis view of greater trochanter , inplane approach , needle tip approximated post lateral aspect of troch jus t off periosteum.Needle slightly withdrawn then 6mg of betamethasone with 4 cc 1% lidocaine were injected. Patient tolerated procedure well. Post procedure instructions given.  

## 2017-11-10 NOTE — Telephone Encounter (Signed)
-----  Message from Arnoldo Lenis, MD sent at 11/09/2017 11:21 AM EST ----- Echo shows she still has fluid around the heart, fairly stable in size. I don't see the results of the blood work we ordered, she was to have a ANA, ESR, and hscrp done. The fluid around the heart is something that we need to monitor closely at this time, it has not gotten to the point where we need to consider draining it but that's something we may have to consider in the near future if it worsens. Does she have any new symptoms? Repeat limited echo 4 weeks for pericardial effusion please   J BrancH MD

## 2017-11-10 NOTE — Telephone Encounter (Signed)
Have tried several times to reach pt by phone. I released her results to her MyChart. Sent message for front office to schedule echo.

## 2017-11-11 ENCOUNTER — Telehealth: Payer: Self-pay

## 2017-11-11 NOTE — Telephone Encounter (Signed)
Called pt., no answer. Left message for pt to return call.  

## 2017-11-11 NOTE — Telephone Encounter (Signed)
How many doses id she take? How many doses stayed down and were not vomited up?

## 2017-11-11 NOTE — Telephone Encounter (Signed)
Patient called and states the fosfomycin is causing her to have emesis and she has stopped taking it. Patient states she continues to have dysuria and has an appointment with [facility] on 3/12 @10  am

## 2017-11-11 NOTE — Telephone Encounter (Signed)
Call placed to patient she  states she got to the 2nd dose and had vomiting with that as well she stated all the medicine came back up

## 2017-11-11 NOTE — Telephone Encounter (Signed)
-----  Message from Arnoldo Lenis, MD sent at 11/09/2017 11:21 AM EST ----- Echo shows she still has fluid around the heart, fairly stable in size. I don't see the results of the blood work we ordered, she was to have a ANA, ESR, and hscrp done. The fluid around the heart is something that we need to monitor closely at this time, it has not gotten to the point where we need to consider draining it but that's something we may have to consider in the near future if it worsens. Does she have any new symptoms? Repeat limited echo 4 weeks for pericardial effusion please   J BrancH MD

## 2017-11-12 MED ORDER — NITROFURANTOIN MONOHYD MACRO 100 MG PO CAPS: 100.0000 mg | ORAL_CAPSULE | Freq: Two times a day (BID) | ORAL | 0 refills | Status: DC

## 2017-11-12 MED ORDER — DOXYCYCLINE HYCLATE 100 MG PO CAPS: 100.0000 mg | ORAL_CAPSULE | Freq: Two times a day (BID) | ORAL | 0 refills | Status: DC

## 2017-11-12 NOTE — Telephone Encounter (Signed)
In addition to the nitrofurantoin also have her take: --- Doxycycline 100 mg 1 p.o. twice daily x 7 days dispense #14+0.  (FYI--I discussed the case this morning with Dr. Dennard Schaumann. At that time decided to use the nitrofurantoin as this was intermediate on the urine culture. also decided to add doxycycline (or azithromycin) as these penetrate GU area and are used to treat female STDs.)

## 2017-11-12 NOTE — Telephone Encounter (Signed)
Have her take: Nitrofurantoin SR 1 po BID x 7 days # 14 + 0

## 2017-11-12 NOTE — Telephone Encounter (Signed)
Call placed to patient and patient made aware.   Prescriptions sent to pharmacy.

## 2017-11-17 ENCOUNTER — Other Ambulatory Visit: Payer: Self-pay | Admitting: Physician Assistant

## 2017-11-17 ENCOUNTER — Ambulatory Visit (INDEPENDENT_AMBULATORY_CARE_PROVIDER_SITE_OTHER): Payer: Medicaid Other | Admitting: Urology

## 2017-11-17 DIAGNOSIS — N952 Postmenopausal atrophic vaginitis: Secondary | ICD-10-CM | POA: Diagnosis not present

## 2017-11-17 DIAGNOSIS — N3000 Acute cystitis without hematuria: Secondary | ICD-10-CM

## 2017-11-18 ENCOUNTER — Telehealth: Payer: Self-pay

## 2017-11-18 NOTE — Telephone Encounter (Signed)
-----  Message from Arnoldo Lenis, MD sent at 11/09/2017 11:21 AM EST ----- Echo shows she still has fluid around the heart, fairly stable in size. I don't see the results of the blood work we ordered, she was to have a ANA, ESR, and hscrp done. The fluid around the heart is something that we need to monitor closely at this time, it has not gotten to the point where we need to consider draining it but that's something we may have to consider in the near future if it worsens. Does she have any new symptoms? Repeat limited echo 4 weeks for pericardial effusion please   J BrancH MD

## 2017-11-18 NOTE — Telephone Encounter (Signed)
Called pt. No answer. Left message for pt to return call. I mailed her a letter. Was wanting to find out if she ever received it.

## 2017-11-23 LAB — ANA: ANA: NEGATIVE

## 2017-11-23 LAB — SEDIMENTATION RATE: SED RATE: 6 mm/h (ref 0–30)

## 2017-11-23 LAB — C-REACTIVE PROTEIN: CRP: 8.4 mg/L — AB (ref <8.0)

## 2017-11-24 ENCOUNTER — Other Ambulatory Visit: Payer: Self-pay | Admitting: Physician Assistant

## 2017-11-24 NOTE — Telephone Encounter (Signed)
Refill appropriate 

## 2017-12-03 ENCOUNTER — Other Ambulatory Visit: Payer: Self-pay | Admitting: Cardiology

## 2017-12-03 ENCOUNTER — Ambulatory Visit (HOSPITAL_COMMUNITY)
Admission: RE | Admit: 2017-12-03 | Discharge: 2017-12-03 | Disposition: A | Discharge status: Home / Self Care | Payer: Medicaid Other | Source: Ambulatory Visit | Attending: Cardiology | Admitting: Cardiology

## 2017-12-03 DIAGNOSIS — I3139 Other pericardial effusion (noninflammatory): Secondary | ICD-10-CM

## 2017-12-03 DIAGNOSIS — I313 Pericardial effusion (noninflammatory): Secondary | ICD-10-CM | POA: Insufficient documentation

## 2017-12-03 DIAGNOSIS — E785 Hyperlipidemia, unspecified: Secondary | ICD-10-CM | POA: Diagnosis not present

## 2017-12-03 DIAGNOSIS — E119 Type 2 diabetes mellitus without complications: Secondary | ICD-10-CM | POA: Diagnosis not present

## 2017-12-03 DIAGNOSIS — J449 Chronic obstructive pulmonary disease, unspecified: Secondary | ICD-10-CM | POA: Diagnosis not present

## 2017-12-03 DIAGNOSIS — Z720 Tobacco use: Secondary | ICD-10-CM | POA: Diagnosis not present

## 2017-12-03 NOTE — Progress Notes (Signed)
*  PRELIMINARY RESULTS* Echocardiogram 2D Echocardiogram limited has been performed.  Leavy Cella 12/03/2017, 11:21 AM

## 2017-12-04 ENCOUNTER — Telehealth: Payer: Self-pay

## 2017-12-04 DIAGNOSIS — I313 Pericardial effusion (noninflammatory): Secondary | ICD-10-CM

## 2017-12-04 DIAGNOSIS — I3139 Other pericardial effusion (noninflammatory): Secondary | ICD-10-CM

## 2017-12-04 NOTE — Telephone Encounter (Signed)
-----   Message from Arnoldo Lenis, MD sent at 12/04/2017  3:58 PM EDT ----- Fluid around the heart is stable in size. For now its just something we continue to monitor, I would not consider draining it unless she started developing significant symptoms of SOB. She needs to let us know of any new symptoms. Otherwise she should have a repeat limited echo in 3 months for pericardial effusion   J BrancH MD

## 2017-12-04 NOTE — Telephone Encounter (Signed)
Called pt. No answer. Left message for pt to return call. The results were released to pt's MyChart. Order placed for echo in 3 months. Will send message to front office to schedule.

## 2017-12-08 ENCOUNTER — Ambulatory Visit: Payer: Medicaid Other | Admitting: Internal Medicine

## 2017-12-10 ENCOUNTER — Ambulatory Visit: Payer: Medicaid Other | Admitting: Physical Medicine & Rehabilitation

## 2017-12-10 ENCOUNTER — Encounter: Payer: Self-pay | Admitting: Physical Medicine & Rehabilitation

## 2017-12-10 ENCOUNTER — Encounter: Payer: Medicaid Other | Attending: Physical Medicine and Rehabilitation

## 2017-12-10 VITALS — BP 128/76 | HR 96

## 2017-12-10 DIAGNOSIS — K219 Gastro-esophageal reflux disease without esophagitis: Secondary | ICD-10-CM | POA: Insufficient documentation

## 2017-12-10 DIAGNOSIS — G8929 Other chronic pain: Secondary | ICD-10-CM | POA: Insufficient documentation

## 2017-12-10 DIAGNOSIS — E119 Type 2 diabetes mellitus without complications: Secondary | ICD-10-CM | POA: Diagnosis not present

## 2017-12-10 DIAGNOSIS — J449 Chronic obstructive pulmonary disease, unspecified: Secondary | ICD-10-CM | POA: Insufficient documentation

## 2017-12-10 DIAGNOSIS — M25512 Pain in left shoulder: Secondary | ICD-10-CM | POA: Diagnosis not present

## 2017-12-10 DIAGNOSIS — E785 Hyperlipidemia, unspecified: Secondary | ICD-10-CM | POA: Diagnosis not present

## 2017-12-10 DIAGNOSIS — M7542 Impingement syndrome of left shoulder: Secondary | ICD-10-CM

## 2017-12-10 DIAGNOSIS — M47814 Spondylosis without myelopathy or radiculopathy, thoracic region: Secondary | ICD-10-CM | POA: Insufficient documentation

## 2017-12-10 DIAGNOSIS — E039 Hypothyroidism, unspecified: Secondary | ICD-10-CM | POA: Insufficient documentation

## 2017-12-10 DIAGNOSIS — M25551 Pain in right hip: Secondary | ICD-10-CM | POA: Insufficient documentation

## 2017-12-10 DIAGNOSIS — M47816 Spondylosis without myelopathy or radiculopathy, lumbar region: Secondary | ICD-10-CM | POA: Diagnosis not present

## 2017-12-10 DIAGNOSIS — M810 Age-related osteoporosis without current pathological fracture: Secondary | ICD-10-CM | POA: Diagnosis not present

## 2017-12-10 DIAGNOSIS — M79604 Pain in right leg: Secondary | ICD-10-CM | POA: Diagnosis not present

## 2017-12-10 DIAGNOSIS — F17210 Nicotine dependence, cigarettes, uncomplicated: Secondary | ICD-10-CM | POA: Diagnosis not present

## 2017-12-10 MED ORDER — HYDROCODONE-ACETAMINOPHEN 10-325 MG PO TABS: 1.0000 | ORAL_TABLET | Freq: Three times a day (TID) | ORAL | 0 refills | Status: DC

## 2017-12-10 NOTE — Progress Notes (Addendum)
  Shoulder injection Left  With ultrasound guidance)  Indication:Left Shoulder pain not relieved by medication management and other conservative care.  Informed consent was obtained after describing risks and benefits of the procedure with the patient, this includes bleeding, bruising, infection and medication side effects. The patient wishes to proceed and has given written consent. Patient was placed in a seated position. The LEFT shoulder was marked and prepped with betadine in the subacromial area. A 25-gauge 1-1/2 inch needle was used to anesthetize the skin and sub q tissues. Then a 74mm ECHO bloc needle was inserted under direct [place] visualization into the Left subacromial bursa , a solution containing 1 mL of 6 mg per ML betamethasone and 4 mL of 1% lidocaine was injected. A band aid was applied. The patient tolerated the procedure well. Post procedure instructions were given.  Indication for chronic opioid: Chronic back and Right hip pain Medication and dose: Hydrocodone # pills per month: Hydrocodone 10mg  TID Last UDS date: 10/15/17 Opioid Treatment Agreement signed (Y/N): Y Opioid Treatment Agreement last reviewed with patient:   [facility] reviewed this encounter (include red flags):  12/10/17

## 2017-12-10 NOTE — Patient Instructions (Signed)
Shoulder Impingement Syndrome Shoulder impingement syndrome is a condition that causes pain when connective tissues (tendons) surrounding the shoulder joint become pinched. These tendons are part of the group of muscles and tissues that help to stabilize the shoulder (rotator cuff). Beneath the rotator cuff is a fluid-filled sac (bursa) that allows the muscles and tendons to glide smoothly. The bursa may become swollen or irritated (bursitis). Bursitis, swelling in the rotator cuff tendons, or both conditions can decrease how much space is under a bone in the shoulder joint (acromion), resulting in impingement. What are the causes? Shoulder impingement syndrome can be caused by bursitis or swelling of the rotator cuff tendons, which may result from:  Repetitive overhead arm movements.  Falling onto the shoulder.  Weakness in the shoulder muscles.  What increases the risk? You may be more likely to develop this condition if you are an athlete who participates in:  Sports that involve throwing, such as baseball.  Tennis.  Swimming.  Volleyball.  Some people are also more likely to develop impingement syndrome because of the shape of their acromion bone. What are the signs or symptoms? The main symptom of this condition is pain on the front or side of the shoulder. Pain may:  Get worse when lifting or raising the arm.  Get worse at night.  Wake you up from sleeping.  Feel sharp when the shoulder is moved, and then fade to an ache.  Other signs and symptoms may include:  Tenderness.  Stiffness.  Inability to raise the arm above shoulder level or behind the body.  Weakness.  How is this diagnosed? This condition may be diagnosed based on:  Your symptoms.  Your medical history.  A physical exam.  Imaging tests, such as: ? X-rays. ? MRI. ? Ultrasound.  How is this treated? Treatment for this condition may include:  Resting your shoulder and avoiding all  activities that cause pain or put stress on the shoulder.  Icing your shoulder.  NSAIDs to help reduce pain and swelling.  One or more injections of medicines to numb the area and reduce inflammation.  Physical therapy.  Surgery. This may be needed if nonsurgical treatments have not helped. Surgery may involve repairing the rotator cuff, reshaping the acromion, or removing the bursa.  Follow these instructions at home: Managing pain, stiffness, and swelling  If directed, apply ice to the injured area. ? Put ice in a plastic bag. ? Place a towel between your skin and the bag. ? Leave the ice on for 20 minutes, 2-3 times a day. Activity  Rest and return to your normal activities as told by your health care provider. Ask your health care provider what activities are safe for you.  Do exercises as told by your health care provider. General instructions  Do not use any tobacco products, including cigarettes, chewing tobacco, or e-cigarettes. Tobacco can delay healing. If you need help quitting, ask your health care provider.  Ask your health care provider when it is safe for you to drive.  Take over-the-counter and prescription medicines only as told by your health care provider.  Keep all follow-up visits as told by your health care provider. This is important. How is this prevented?  Give your body time to rest between periods of activity.  Be safe and responsible while being active to avoid falls.  Maintain physical fitness, including strength and flexibility. Contact a health care provider if:  Your symptoms have not improved after 1-2 months of treatment and   rest.  You cannot lift your arm away from your body. This information is not intended to replace advice given to you by your health care provider. Make sure you discuss any questions you have with your health care provider. Document Released: 08/25/2005 Document Revised: 05/01/2016 Document Reviewed:  07/28/2015 Elsevier Interactive Patient Education  2018 [employer]  

## 2017-12-14 ENCOUNTER — Telehealth: Payer: Self-pay

## 2017-12-15 NOTE — Telephone Encounter (Signed)
[facility] called requesting to see patient 1 every 2 months under the CAPS program.VO given

## 2017-12-16 ENCOUNTER — Other Ambulatory Visit: Payer: Self-pay | Admitting: Physician Assistant

## 2017-12-16 NOTE — Telephone Encounter (Signed)
Approved.  

## 2017-12-21 NOTE — Telephone Encounter (Signed)
Prior authoirzation started on choline fenofibrate

## 2017-12-22 ENCOUNTER — Other Ambulatory Visit: Payer: Self-pay | Admitting: Physician Assistant

## 2017-12-29 ENCOUNTER — Other Ambulatory Visit: Payer: Self-pay | Admitting: Physician Assistant

## 2018-01-05 ENCOUNTER — Other Ambulatory Visit: Payer: Self-pay | Admitting: Physician Assistant

## 2018-01-07 NOTE — Telephone Encounter (Signed)
Tricor 145mg  1 po QD # 30 + 5.

## 2018-01-07 NOTE — Telephone Encounter (Signed)
Prior authorization for Choline Fenofibrate has ben denied  Because it is a non preferred drug.Patient is required to take to try and fail two preferred drugs .  Alternatives Fenofibrate tablet(Tricor) Gemfibrozil tablet(lopid)  which would you like to switch to?

## 2018-01-11 MED ORDER — FENOFIBRATE 145 MG PO TABS: 145.0000 mg | ORAL_TABLET | Freq: Every day | ORAL | 5 refills | Status: DC

## 2018-01-11 NOTE — Telephone Encounter (Signed)
New rx sent to pharmacy

## 2018-01-14 ENCOUNTER — Encounter: Payer: Self-pay | Admitting: Registered Nurse

## 2018-01-14 ENCOUNTER — Other Ambulatory Visit: Payer: Self-pay

## 2018-01-14 ENCOUNTER — Encounter: Payer: Medicaid Other | Attending: Physical Medicine and Rehabilitation | Admitting: Registered Nurse

## 2018-01-14 VITALS — BP 126/82 | HR 100

## 2018-01-14 DIAGNOSIS — M7542 Impingement syndrome of left shoulder: Secondary | ICD-10-CM | POA: Diagnosis not present

## 2018-01-14 DIAGNOSIS — M7061 Trochanteric bursitis, right hip: Secondary | ICD-10-CM | POA: Diagnosis not present

## 2018-01-14 DIAGNOSIS — E119 Type 2 diabetes mellitus without complications: Secondary | ICD-10-CM | POA: Insufficient documentation

## 2018-01-14 DIAGNOSIS — M7062 Trochanteric bursitis, left hip: Secondary | ICD-10-CM

## 2018-01-14 DIAGNOSIS — G8929 Other chronic pain: Secondary | ICD-10-CM

## 2018-01-14 DIAGNOSIS — M546 Pain in thoracic spine: Secondary | ICD-10-CM

## 2018-01-14 DIAGNOSIS — F17210 Nicotine dependence, cigarettes, uncomplicated: Secondary | ICD-10-CM | POA: Diagnosis not present

## 2018-01-14 DIAGNOSIS — M75101 Unspecified rotator cuff tear or rupture of right shoulder, not specified as traumatic: Secondary | ICD-10-CM | POA: Diagnosis not present

## 2018-01-14 DIAGNOSIS — G894 Chronic pain syndrome: Secondary | ICD-10-CM | POA: Diagnosis not present

## 2018-01-14 DIAGNOSIS — J449 Chronic obstructive pulmonary disease, unspecified: Secondary | ICD-10-CM | POA: Diagnosis not present

## 2018-01-14 DIAGNOSIS — E785 Hyperlipidemia, unspecified: Secondary | ICD-10-CM | POA: Diagnosis not present

## 2018-01-14 DIAGNOSIS — M5412 Radiculopathy, cervical region: Secondary | ICD-10-CM

## 2018-01-14 DIAGNOSIS — K219 Gastro-esophageal reflux disease without esophagitis: Secondary | ICD-10-CM | POA: Diagnosis not present

## 2018-01-14 DIAGNOSIS — M5416 Radiculopathy, lumbar region: Secondary | ICD-10-CM | POA: Diagnosis not present

## 2018-01-14 DIAGNOSIS — M961 Postlaminectomy syndrome, not elsewhere classified: Secondary | ICD-10-CM | POA: Diagnosis not present

## 2018-01-14 DIAGNOSIS — Z79899 Other long term (current) drug therapy: Secondary | ICD-10-CM | POA: Diagnosis not present

## 2018-01-14 DIAGNOSIS — Z5181 Encounter for therapeutic drug level monitoring: Secondary | ICD-10-CM

## 2018-01-14 DIAGNOSIS — M810 Age-related osteoporosis without current pathological fracture: Secondary | ICD-10-CM | POA: Diagnosis not present

## 2018-01-14 DIAGNOSIS — M79604 Pain in right leg: Secondary | ICD-10-CM | POA: Insufficient documentation

## 2018-01-14 DIAGNOSIS — M25551 Pain in right hip: Secondary | ICD-10-CM | POA: Diagnosis not present

## 2018-01-14 DIAGNOSIS — M542 Cervicalgia: Secondary | ICD-10-CM | POA: Diagnosis not present

## 2018-01-14 DIAGNOSIS — IMO0002 Reserved for concepts with insufficient information to code with codable children: Secondary | ICD-10-CM

## 2018-01-14 DIAGNOSIS — M25512 Pain in left shoulder: Secondary | ICD-10-CM | POA: Diagnosis not present

## 2018-01-14 DIAGNOSIS — M47814 Spondylosis without myelopathy or radiculopathy, thoracic region: Secondary | ICD-10-CM | POA: Diagnosis not present

## 2018-01-14 DIAGNOSIS — M47816 Spondylosis without myelopathy or radiculopathy, lumbar region: Secondary | ICD-10-CM | POA: Diagnosis not present

## 2018-01-14 DIAGNOSIS — E039 Hypothyroidism, unspecified: Secondary | ICD-10-CM | POA: Diagnosis not present

## 2018-01-14 MED ORDER — HYDROCODONE-ACETAMINOPHEN 10-325 MG PO TABS: 1.0000 | ORAL_TABLET | Freq: Three times a day (TID) | ORAL | 0 refills | Status: DC

## 2018-01-14 NOTE — Patient Instructions (Signed)
Due to increase Neuropathic Pain : Increase Gabapentin to Two Capsules in the Morning, One Capsule in late afternoon and two capsules at Bedtime .  I would like for you to try this regimen while AIDE is there she verbalizes understanding.   Call office in a week to evaluate   [phone]
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
## 2018-01-15 ENCOUNTER — Telehealth: Payer: Self-pay | Admitting: *Deleted

## 2018-01-15 NOTE — Telephone Encounter (Signed)
Prior authorizatoion submitted to [place] [facility] for Hydrocodone-acetaminophen

## 2018-01-18 ENCOUNTER — Other Ambulatory Visit: Payer: Self-pay | Admitting: Physician Assistant

## 2018-01-18 ENCOUNTER — Other Ambulatory Visit: Payer: Self-pay | Admitting: Gastroenterology

## 2018-01-18 ENCOUNTER — Other Ambulatory Visit: Payer: Self-pay | Admitting: Registered Nurse

## 2018-01-18 ENCOUNTER — Ambulatory Visit: Payer: Medicaid Other | Admitting: Physician Assistant

## 2018-01-18 DIAGNOSIS — M7062 Trochanteric bursitis, left hip: Secondary | ICD-10-CM

## 2018-01-18 DIAGNOSIS — G8929 Other chronic pain: Secondary | ICD-10-CM

## 2018-01-18 DIAGNOSIS — M25561 Pain in right knee: Principal | ICD-10-CM

## 2018-01-18 DIAGNOSIS — M25512 Pain in left shoulder: Secondary | ICD-10-CM

## 2018-01-19 ENCOUNTER — Ambulatory Visit: Payer: Medicaid Other | Admitting: Internal Medicine

## 2018-01-19 ENCOUNTER — Other Ambulatory Visit: Payer: Self-pay

## 2018-01-25 ENCOUNTER — Other Ambulatory Visit: Payer: Self-pay

## 2018-01-25 ENCOUNTER — Ambulatory Visit (INDEPENDENT_AMBULATORY_CARE_PROVIDER_SITE_OTHER): Payer: Medicaid Other | Admitting: Physician Assistant

## 2018-01-25 ENCOUNTER — Encounter: Payer: Self-pay | Admitting: Physician Assistant

## 2018-01-25 ENCOUNTER — Telehealth: Payer: Self-pay | Admitting: Physician Assistant

## 2018-01-25 VITALS — BP 130/80 | HR 112 | Temp 98.3°F

## 2018-01-25 DIAGNOSIS — E039 Hypothyroidism, unspecified: Secondary | ICD-10-CM

## 2018-01-25 DIAGNOSIS — E785 Hyperlipidemia, unspecified: Secondary | ICD-10-CM | POA: Diagnosis not present

## 2018-01-25 DIAGNOSIS — F17200 Nicotine dependence, unspecified, uncomplicated: Secondary | ICD-10-CM

## 2018-01-25 DIAGNOSIS — J439 Emphysema, unspecified: Secondary | ICD-10-CM | POA: Diagnosis not present

## 2018-01-25 DIAGNOSIS — E119 Type 2 diabetes mellitus without complications: Secondary | ICD-10-CM | POA: Diagnosis not present

## 2018-01-25 NOTE — Telephone Encounter (Signed)
Pt needs nebulizer tubing for her nebulizer machine at home. Wants [place] to call in to [employer]
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
## 2018-01-27 ENCOUNTER — Other Ambulatory Visit: Payer: Self-pay | Admitting: Physician Assistant

## 2018-01-27 LAB — MICROALBUMIN, URINE: Microalb, Ur: 17.3 mg/dL

## 2018-01-27 LAB — LIPID PANEL

## 2018-01-27 LAB — HEMOGLOBIN A1C
Hgb A1c MFr Bld: 6.5 % of total Hgb — ABNORMAL HIGH (ref <5.7)
Mean Plasma Glucose: 140 (calc)
eAG (mmol/L): 7.7 (calc)

## 2018-01-27 LAB — COMPLETE METABOLIC PANEL WITH GFR

## 2018-01-27 LAB — TSH: TSH: 3.74 mIU/L (ref 0.40–4.50)

## 2018-01-27 NOTE — Telephone Encounter (Signed)
Rx has been faxed over to pharmacy l[facility] for patient

## 2018-01-28 ENCOUNTER — Other Ambulatory Visit: Payer: Self-pay

## 2018-01-28 MED ORDER — RALOXIFENE HCL 60 MG PO TABS: 60.0000 mg | ORAL_TABLET | Freq: Every day | ORAL | 2 refills | Status: DC

## 2018-02-05 ENCOUNTER — Ambulatory Visit: Payer: Medicaid Other | Admitting: Family Medicine

## 2018-02-05 ENCOUNTER — Other Ambulatory Visit: Payer: Self-pay

## 2018-02-05 ENCOUNTER — Encounter: Payer: Self-pay | Admitting: Family Medicine

## 2018-02-05 VITALS — BP 124/78 | HR 95 | Temp 98.2°F

## 2018-02-05 DIAGNOSIS — T7840XA Allergy, unspecified, initial encounter: Secondary | ICD-10-CM

## 2018-02-05 DIAGNOSIS — B370 Candidal stomatitis: Secondary | ICD-10-CM | POA: Diagnosis not present

## 2018-02-05 DIAGNOSIS — N390 Urinary tract infection, site not specified: Secondary | ICD-10-CM | POA: Diagnosis not present

## 2018-02-05 DIAGNOSIS — R319 Hematuria, unspecified: Secondary | ICD-10-CM

## 2018-02-05 DIAGNOSIS — H60543 Acute eczematoid otitis externa, bilateral: Secondary | ICD-10-CM

## 2018-02-05 LAB — URINALYSIS, ROUTINE W REFLEX MICROSCOPIC
Bilirubin Urine: NEGATIVE
Glucose, UA: NEGATIVE
KETONES UR: NEGATIVE
NITRITE: POSITIVE — AB
SPECIFIC GRAVITY, URINE: 1.025 (ref 1.001–1.03)
pH: 5.5 (ref 5.0–8.0)

## 2018-02-05 LAB — MICROSCOPIC MESSAGE

## 2018-02-05 MED ORDER — FLUCONAZOLE 150 MG PO TABS: 150.0000 mg | ORAL_TABLET | Freq: Once | ORAL | 0 refills | Status: AC

## 2018-02-05 MED ORDER — NEOMYCIN-POLYMYXIN-HC 3.5-10000-1 OT SOLN: 4.0000 [drp] | Freq: Four times a day (QID) | OTIC | 0 refills | Status: AC

## 2018-02-05 MED ORDER — METHYLPREDNISOLONE ACETATE 80 MG/ML IJ SUSP
80.0000 mg | Freq: Once | INTRAMUSCULAR | Status: AC
  Administered 2018-02-05: 80 mg via INTRAMUSCULAR

## 2018-02-05 MED ORDER — CEFTRIAXONE SODIUM 1 G IJ SOLR
1.0000 g | Freq: Once | INTRAMUSCULAR | Status: AC
  Administered 2018-02-05: 1 g via INTRAMUSCULAR

## 2018-02-05 MED ORDER — DIPHENHYDRAMINE HCL 50 MG PO CAPS: 50.0000 mg | ORAL_CAPSULE | Freq: Once | ORAL | Status: DC

## 2018-02-05 MED ORDER — DIPHENHYDRAMINE HCL 50 MG/ML IJ SOLN
50.0000 mg | Freq: Once | INTRAMUSCULAR | Status: AC
  Administered 2018-02-05: 50 mg via INTRAMUSCULAR

## 2018-02-05 MED ORDER — AMOXICILLIN-POT CLAVULANATE 875-125 MG PO TABS: 1.0000 | ORAL_TABLET | Freq: Two times a day (BID) | ORAL | 0 refills | Status: DC

## 2018-02-05 NOTE — Patient Instructions (Signed)
Take Augmentin for urinary tract infection.  I will call you soon as I have culture results if we need to switch medications.  Please go to the ER if you are having nausea, vomiting, severe abdominal pain, weakness, lightheadedness or confusion.   Updated your chart to put cephalosporin allergy on there  I do not know why you have a penicillin allergy that causes airway involvement but you are able to tolerate Augmentin, I have also put that in the year so it is noted in your chart history  You can use the eardrops bilaterally for itching and swelling for chronic otitis externa issues

## 2018-02-05 NOTE — Progress Notes (Addendum)
Patient ID: Lisa Ewing, female    DOB: 1955-11-16, 62 y.o.   MRN: [id number]  PCP: Orlena Sheldon, PA-C  Chief Complaint  Patient presents with  . Urinary Tract Infection    Patient has c/o pressure after urination with buring during urination. Lower back pain    Subjective:   Lisa Ewing is a 62 y.o. female, presents to clinic with CC of dysuria, hematuria with associated mild back pain.  She endorses burning sensation only when urinating with increased urgency.  She has hx of recurrent UTI's, does see specialist, has multiple antibiotic allergies, and last culture was difficult to treat due to resistance and abx choices.  She has mild nausea and decreased appetite but denies any vomiting, sweats, chills, weakness.  She is seen in clinic with her home health aid with her.  Aid denies any change to baseline mental or functional status, no confusion, change to ambulation, energy, baseline breathing.    Lisa Ewing does have a history of chronic pain, neuropathy, has pain management, has history of pulmonary emphysema/COPD, diabetes, hyperlipidemia, current smoker, is wheelchair-bound at her baseline.  She denies any change to her baseline SOB.    Patient Active Problem List   Diagnosis Date Noted  . Fatty liver 05/14/2017  . Upper abdominal pain 05/13/2017  . RUQ pain 05/13/2017  . Iron deficiency anemia 05/13/2017  . Tobacco use 11/14/2016  . Diabetes mellitus type 2, uncomplicated ([place]) 85/88/5027  . Adhesive bursitis of left shoulder 03/04/2016  . Arthritis 03/01/2015  . Mucosal abnormality of stomach   . Mucosal abnormality of esophagus   . Vitamin D deficiency 01/02/2015  . Postlaminectomy syndrome, lumbar region 12/29/2014  . Chronic lumbar radiculopathy 12/29/2014  . Dysphagia, pharyngoesophageal phase 07/18/2014  . Dyspepsia 07/13/2014  . Subacromial impingement 04/05/2014  . Trochanteric bursitis of left hip 12/15/2013  . COPD exacerbation ([place]) 10/12/2013  .  Tachycardia 10/12/2013  . Hypothyroid 07/11/2013  . Allergic rhinitis   . Asthma   . COPD (chronic obstructive pulmonary disease) ([place])   . Depression   . Hyperlipidemia   . Osteoporosis   . Smoker   . Urinary tract infection 06/02/2013  . Leukocytosis, unspecified 05/28/2013  . IBS (irritable bowel syndrome) 05/11/2013  . Diarrhea 03/02/2013  . Abdominal pain, chronic, right upper quadrant 03/02/2013  . Gait disorder 01/14/2012  . Chronic leg pain 12/19/2011  . Chronic low back pain 12/19/2011  . Chronic neck pain 12/19/2011  . Shoulder pain, bilateral 12/19/2011  . Thoracic back pain 12/19/2011  . ABDOMINAL PAIN, GENERALIZED 02/06/2010  . GERD 11/27/2009  . CONSTIPATION, CHRONIC 11/27/2009  . History of colonic polyps 11/27/2009  . [place] ALLERGY 11/27/2009  . Allergy to latex 11/27/2009     Prior to Admission medications   Medication Sig Start Date End Date Taking? Authorizing Provider  ACCU-CHEK FASTCLIX LANCETS MISC 1 each by Other route daily. 01/08/17  Yes Dena Billet B, PA-C  acyclovir (ZOVIRAX) 400 MG tablet TAKE ONE TABLET BY MOUTH ONCE DAILY. 01/18/18  Yes Orlena Sheldon, PA-C  ADVAIR DISKUS 250-50 MCG/DOSE AEPB INHALE 1 PUFF TWICE DAILY; RINSE AFTER USE. 01/18/18  Yes Dena Billet B, PA-C  albuterol (PROVENTIL HFA) 108 (90 Base) MCG/ACT inhaler USE 2 PUFFS EVERY 4 TO 6 HOURS AS NEEDED FOR SHORTNESS OF BREATH 01/08/17  Yes Dena Billet B, PA-C  atorvastatin (LIPITOR) 40 MG tablet TAKE 1 TABLET BY MOUTH ONCE DAILY AT 6:00 PM. 01/18/18  Yes Orlena Sheldon, PA-C  calcitRIOL (ROCALTROL) 0.25 MCG capsule TAKE ONE CAPSULE BY MOUTH ONCE DAILY Patient taking differently: TAKE 0.74mcg CAPSULE BY MOUTH ONCE DAILY 08/18/13  Yes Susy Frizzle, MD  cetirizine (ZYRTEC) 10 MG tablet TAKE ONE TABLET BY MOUTH DAILY FOR ALLERGIES 01/08/17  Yes Dena Billet B, PA-C  cetirizine (ZYRTEC) 10 MG tablet TAKE ONE TABLET BY MOUTH ONCE DAILY. 01/18/18  Yes Orlena Sheldon, PA-C  Cholecalciferol  (VITAMIN D3) 2000 units capsule Take 1 capsule (2,000 Units total) by mouth daily. 01/08/17  Yes Orlena Sheldon, PA-C  doxycycline (VIBRAMYCIN) 100 MG capsule Take 1 capsule (100 mg total) by mouth 2 (two) times daily. 11/12/17  Yes Dena Billet B, PA-C  EPINEPHrine 0.3 mg/0.3 mL IJ SOAJ injection INJECT 1 PEN INTRAMUSCULARLY AS NEEDED FOR ALLERGIC REACTION 01/08/17  Yes Dena Billet B, PA-C  escitalopram (LEXAPRO) 20 MG tablet TAKE 1 TABLET EVERY DAY. 01/05/18  Yes Dixon, Mary B, PA-C  ezetimibe (ZETIA) 10 MG tablet TAKE ONE TABLET BY MOUTH ONCE DAILY. 12/29/17  Yes Orlena Sheldon, PA-C  fenofibrate (TRICOR) 145 MG tablet Take 1 tablet (145 mg total) by mouth daily. 01/11/18  Yes Dixon, Mary B, PA-C  furosemide (LASIX) 20 MG tablet TAKE ONE TABLET BY MOUTH ONCE DAILY. 01/18/18  Yes Dena Billet B, PA-C  gabapentin (NEURONTIN) 300 MG capsule Take one capsule TID and 2 capsules at bedtime 06/16/17  Yes Danella Sensing L, NP  glucose blood (ACCU-CHEK AVIVA PLUS) test strip USE TO TEST BLOOD SUGAR ONCE DAILY 01/08/17  Yes Orlena Sheldon, PA-C  HYDROcodone-acetaminophen (NORCO) 10-325 MG tablet Take 1 tablet by mouth 3 (three) times daily. 01/14/18  Yes Bayard Hugger, NP  hyoscyamine (LEVSIN SL) 0.125 MG SL tablet PLACE 1 TABLET UNDER THE TONGUE EVERY 4 HOURS AS NEEDED Patient taking differently: PLACE 1 TABLET UNDER THE TONGUE EVERY 4 HOURS AS NEEDED for cramping 02/19/16  Yes Walden Field A, NP  ipratropium-albuterol (DUONEB) 0.5-2.5 (3) MG/3ML SOLN INHALE THE CONTENTS OF ONE VIAL VIA NEBULIZER BY MOUTH FOUR TIMES A DAY AS NEEDED FOR WHEEZING OR SHORTNESS OF BREATH 01/08/17  Yes Dixon, Mary B, PA-C  JANUVIA 100 MG tablet TAKE ONE TABLET BY MOUTH ONCE DAILY. 01/05/18  Yes Dixon, Lonie Peak, PA-C  levothyroxine (SYNTHROID, LEVOTHROID) 137 MCG tablet TAKE 1 TABLET BEFORE BREAKFAST. 10/06/17  Yes Dena Billet B, PA-C  metFORMIN (GLUCOPHAGE) 1000 MG tablet TAKE 1 TABLET BY MOUTH TWICE DAILY WITH MEALS. 01/18/18  Yes Dixon, Mary B, PA-C    methocarbamol (ROBAXIN) 500 MG tablet TAKE (1) TABLET BY MOUTH TWICE A DAY AS NEEDED. 07/27/17  Yes Bayard Hugger, NP  montelukast (SINGULAIR) 10 MG tablet TAKE (1) TABLET BY MOUTH AT BEDTIME. 01/27/18  Yes Orlena Sheldon, PA-C  niacin (NIASPAN) 1000 MG CR tablet TAKE (2) TABLETS BY MOUTH AT BEDTIME. 08/20/17  Yes Dixon, Mary B, PA-C  nicotine (NICODERM CQ - DOSED IN MG/24 HOURS) 14 mg/24hr patch Place 1 patch (14 mg total) onto the skin daily. 01/08/17  Yes Orlena Sheldon, PA-C  nitrofurantoin, macrocrystal-monohydrate, (MACROBID) 100 MG capsule Take 1 capsule (100 mg total) by mouth 2 (two) times daily. 11/12/17  Yes Dena Billet B, PA-C  nystatin (MYCOSTATIN) 100000 UNIT/ML suspension Take 5 mLs (500,000 Units total) by mouth 4 (four) times daily. 10/21/17  Yes Dena Billet B, PA-C  nystatin cream (MYCOSTATIN) APPLY 1 APPLICATION TO AFFECTED AREA(S) TWICE DAILY as needed for irritations 01/08/17  Yes Dixon, Mary B, PA-C  pantoprazole (PROTONIX) 40  MG tablet TAKE ONE TABLET BY MOUTH 2 TIMES A DAY BEFORE A MEAL. 01/19/18  Yes Mahala Menghini, PA-C  pioglitazone (ACTOS) 45 MG tablet TAKE ONE TABLET BY MOUTH ONCE DAILY. 01/18/18  Yes Orlena Sheldon, PA-C  raloxifene (EVISTA) 60 MG tablet Take 1 tablet (60 mg total) by mouth daily. 01/28/18  Yes Dena Billet B, PA-C  sucralfate (CARAFATE) 1 g tablet TAKE 1g TABLET BY MOUTH 4 TIMES A DAY WITH MEALS AND AT BEDTIME as needed for stomach coating 01/08/17  Yes Dixon, Mary B, PA-C  traZODone (DESYREL) 100 MG tablet Take 1 tablet (100 mg total) by mouth at bedtime. 08/09/12  Yes Kirchmayer, Deanna, MD  triamcinolone (KENALOG) 0.025 % cream APPLY 1 APPLICATION TOPICALLY TO AFFECTED AREA(S) TWICE DAILY as needed 01/08/17  Yes Dixon, Mary B, PA-C  VESICARE 5 MG tablet TAKE ONE TABLET BY MOUTH ONCE DAILY. 12/29/17  Yes Dixon, Mary B, PA-C  VOLTAREN 1 % GEL APPLY 2 GRAMS TO AFFECTED AREA FOUR TIMES A DAY. 01/19/18  Yes Bayard Hugger, NP  solifenacin (VESICARE) 5 MG tablet Take 10  mg by mouth daily.  03/03/13  [provider]     Allergies  Allergen Reactions  . Bee Venom Anaphylaxis  . Latex Shortness Of Breath  . Oxycodone-Acetaminophen Hives and Shortness Of Breath    Upset Stomach  . Penicillins Anaphylaxis    Throat swells Has patient had a PCN reaction causing immediate rash, facial/tongue/throat swelling, SOB or lightheadedness with hypotension: yes Has patient had a PCN reaction causing severe rash involving mucus membranes or skin necrosis: no Has patient had a PCN reaction that required hospitalization- yes at hospital Has patient had a PCN reaction occurring within the last 10 years: no If all of the above answers are "NO", then may proceed with Cephalosporin use.   . Shellfish Allergy Anaphylaxis    Throat swells   . Codeine Nausea Only  . Metoclopramide Hcl     Doesn't recall type of reaction  . Pregabalin Swelling  . Sulfonamide Derivatives     Tongue swells   . Adhesive [Tape] Rash  . Other Swelling, Itching and Rash    Almonds. Bananas cause an asthma attack.   . Wellbutrin [Bupropion] Rash    Hives, red whelps     Family History  Problem Relation Age of Onset  . Hyperlipidemia Mother   . Diabetes Father   . Breast cancer Maternal Aunt   . Throat cancer Maternal Grandmother   . Colon cancer Neg Hx      Social History   Socioeconomic History  . Marital status: Divorced    Spouse name: Not on file  . Number of children: Not on file  . Years of education: Not on file  . Highest education level: Not on file  Occupational History  . Not on file  Social Needs  . Financial resource strain: Not on file  . Food insecurity:    Worry: Not on file    Inability: Not on file  . Transportation needs:    Medical: Not on file    Non-medical: Not on file  Tobacco Use  . Smoking status: Current Every Day Smoker    Packs/day: 1.00    Years: 42.00    Pack years: 42.00    Types: Cigarettes  . Smokeless tobacco: Never Used   . Tobacco comment: has patch, but still smokes 4 packs per week  Substance and Sexual Activity  . Alcohol use: Yes  Alcohol/week: 0.0 oz    Comment: cocktail once to twice a week to help relax  . Drug use: No  . Sexual activity: Never    Birth control/protection: Surgical  Lifestyle  . Physical activity:    Days per week: Not on file    Minutes per session: Not on file  . Stress: Not on file  Relationships  . Social connections:    Talks on phone: Not on file    Gets together: Not on file    Attends religious service: Not on file    Active member of club or organization: Not on file    Attends meetings of clubs or organizations: Not on file    Relationship status: Not on file  . Intimate partner violence:    Fear of current or ex partner: Not on file    Emotionally abused: Not on file    Physically abused: Not on file    Forced sexual activity: Not on file  Other Topics Concern  . Not on file  Social History Narrative  . Not on file     Review of Systems   Constitutional: Negative.  Negative for activity change, appetite change, chills, diaphoresis, fatigue and fever.  HENT: Negative.   Eyes: Negative.   Respiratory: Positive for shortness of breath (at baseline).   Cardiovascular: Negative.  Negative for chest pain.  Gastrointestinal: Negative for abdominal distention, abdominal pain, constipation, diarrhea, nausea and vomiting.  Genitourinary: Positive for dysuria, flank pain, frequency, hematuria and urgency. Negative for decreased urine volume, difficulty urinating, enuresis and pelvic pain.  Musculoskeletal: Positive for arthralgias and back pain.  Skin: Negative for color change, pallor, rash and wound.  Neurological: Negative.   Hematological: Negative.   Psychiatric/Behavioral: Negative.   All other systems reviewed and are negative.     Objective:    Vitals:   02/05/18 1023  BP: 124/78  Pulse: 95  Temp: 98.2 F (36.8 C)  TempSrc: Oral  SpO2:  92%      Physical Exam  Physical Exam  Constitutional: She appears well-developed. No distress.  Chronically ill and obese appearing female, appears older than stated age, nontoxic appearing, alert  HENT:  Head: Normocephalic and atraumatic. Head is without right periorbital erythema and without left periorbital erythema.  Nose: Nose normal.  Mouth/Throat: Uvula is midline. Mucous membranes are not pale, dry and not cyanotic. No trismus in the jaw. No uvula swelling. No oropharyngeal exudate or tonsillar abscesses.  Oral mucosa erythematous, raw appearing with patches Tongue matted and dry, lips slightly dry Bilateral external ears with mild edema with dry plaques and patches with slightly erythematous, diffusely dry and flaking skin, her canal is mildly edematous and erythematous, no exudate, no obstructing cerumen, TMs bilaterally normal appearance, hearing grossly normal  Eyes: Pupils are equal, round, and reactive to light. Conjunctivae, EOM and lids are normal. Right eye exhibits no chemosis and no discharge. Left eye exhibits no chemosis and no discharge.  Neck: No tracheal deviation present.  Cardiovascular: Normal rate, regular rhythm, normal heart sounds and intact distal pulses. Exam reveals no gallop and no friction rub.  No murmur heard.  Pulses:  Radial pulses are 2+ on the right side, and 2+ on the left side.  Pulmonary/Chest: Effort normal. No accessory muscle usage or stridor. No tachypnea. No respiratory distress. She has decreased breath sounds. She has no wheezes. She has no rhonchi. She has no rales. She exhibits no tenderness.  Abdominal: Soft. Bowel sounds are normal. She exhibits  no distension and no mass. There is no tenderness. There is no rigidity, no rebound and no guarding.  Obese abdomen, soft, nontender, nondistended, no guarding or rebound tenderness with deep palpation, exam limited by morbid obesity, large pannus and patient limited to wheelchair    Musculoskeletal: Normal range of motion.  Neurological: She is alert. She exhibits normal muscle tone. Coordination normal.  Skin: Skin is warm and dry. No rash noted. She is not diaphoretic.  Psychiatric: She has a normal mood and affect. Her behavior is normal.  Nursing note and vitals reviewed.  Results for orders placed or performed in visit on 02/05/18  Urine Culture  Result Value Ref Range   MICRO NUMBER: [id number]    SPECIMEN QUALITY: ADEQUATE    Sample Source NOT GIVEN    STATUS: FINAL    ISOLATE 1: Escherichia coli (A)       Susceptibility   Escherichia coli - URINE CULTURE, REFLEX    AMOX/CLAVULANIC 4 Sensitive     AMPICILLIN 16 Intermediate     AMPICILLIN/SULBACTAM 4 Sensitive     CEFAZOLIN* <=4 Not Reportable      * For infections other than uncomplicated UTIcaused by E. coli, K. pneumoniae or P. mirabilis:Cefazolin is resistant if MIC > or = 8 mcg/mL.(Distinguishing susceptible versus intermediatefor isolates with MIC < or = 4 mcg/mL requiresadditional testing.)For uncomplicated UTI caused by E. coli,K. pneumoniae or P. mirabilis: Cefazolin issusceptible if MIC <32 mcg/mL and predictssusceptible to the oral agents cefaclor, cefdinir,cefpodoxime, cefprozil, cefuroxime, cephalexinand loracarbef.    CEFEPIME <=1 Sensitive     CEFTRIAXONE <=1 Sensitive     CIPROFLOXACIN <=0.25 Sensitive     LEVOFLOXACIN <=0.12 Sensitive     ERTAPENEM <=0.5 Sensitive     GENTAMICIN <=1 Sensitive     IMIPENEM <=0.25 Sensitive     NITROFURANTOIN <=16 Sensitive     PIP/TAZO <=4 Sensitive     TOBRAMYCIN <=1 Sensitive     TRIMETH/SULFA* <=20 Sensitive      * For infections other than uncomplicated UTIcaused by E. coli, K. pneumoniae or P. mirabilis:Cefazolin is resistant if MIC > or = 8 mcg/mL.(Distinguishing susceptible versus intermediatefor isolates with MIC < or = 4 mcg/mL requiresadditional testing.)For uncomplicated UTI caused by E. coli,K. pneumoniae or P. mirabilis: Cefazolin issusceptible  if MIC <32 mcg/mL and predictssusceptible to the oral agents cefaclor, cefdinir,cefpodoxime, cefprozil, cefuroxime, cephalexinand loracarbef.Legend:S = Susceptible  I = IntermediateR = Resistant  NS = Not susceptible* = Not tested  NR = Not reported**NN = See antimicrobic comments  Urinalysis, Routine w reflex microscopic  Result Value Ref Range   Color, Urine YELLOW YELLOW   APPearance CLOUDY (A) CLEAR   Specific Gravity, Urine 1.025 1.001 - 1.03   pH 5.5 5.0 - 8.0   Glucose, UA NEGATIVE NEGATIVE   Bilirubin Urine NEGATIVE NEGATIVE   Ketones, ur NEGATIVE NEGATIVE   Hgb urine dipstick 1+ (A) NEGATIVE   Protein, ur 1+ (A) NEGATIVE   Nitrite POSITIVE (A) NEGATIVE   Leukocytes, UA 3+ (A) NEGATIVE   WBC, UA 40-60 (A) 0 - 5 /HPF   RBC / HPF 3-10 (A) 0 - 2 /HPF   Squamous Epithelial / LPF 0-5 < OR = 5 /HPF   Bacteria, UA MODERATE (A) NONE SEEN /HPF  Microscopic Message  Result Value Ref Range   Note          Assessment & Plan:      ICD-10-CM   1. Urinary tract infection with hematuria, site unspecified N39.0 Urinalysis,  Routine w reflex microscopic   R31.9 Microscopic Message    Urine Culture    fluconazole (DIFLUCAN) 150 MG tablet    cefTRIAXone (ROCEPHIN) injection 1 g   Attempted Rocephin in clinic to observe for tolerance of cephalosporins with penicillin allergy documented but recent prescriptions for Augmentin.  pt had rxn  2. Allergic reaction to drug, initial encounter T78.40XA diphenhydrAMINE (BENADRYL) capsule 50 mg    methylPREDNISolone acetate (DEPO-MEDROL) injection 80 mg    diphenhydrAMINE (BENADRYL) injection 50 mg   cephalosporin allergy updated in chart  3. Eczematoid otitis externa of both ears, unspecified chronicity  Prior to leaving clinic patient briefly mentioned chronic itching, irritation and mild pain to both of her ears, she has mild swelling in flaking and peeling skin in around both of her ears, will give drops to help with sx. H60.543  neomycin-polymyxin-hydrocortisone (CORTISPORIN) OTIC solution  4. Oral thrush B37.0 fluconazole (DIFLUCAN) 150 MG tablet   chronic thrush and dry mouth - with abx tx will cover with diflucan x 2     Patient with dysuria, concern for recurrent UTI, also concern for yeast vaginitis also possibly causing some dysuria and hematuria.  Urinalysis is concerning for UTI we will start treatment.  Chart reviewed at length with recent cultures, past visits, antibiotic use, allergy profile.  Urine culture ordered.    Patient did experience some tingling of her lips after being given a Rocephin shot.  It was attempted in clinic because of her allergy profile, recent urine culture and sensitivity.  She has been able to tolerate Augmentin and amoxicillin per chart and pt's recollection, but has a documented penicillin allergy, no notation anywhere of tolerance or allergy to Keflex or Rocephin.  It would be the best choice to treat UTI given her presentation and past culture and sensitivity however it appears she did not tolerate it.    After injection she was observed carefully, she reported tingling in her mouth and lips, one exam she had to rash, hives, lip swelling, intraoral edema or stridor, her vital signs were stable, no change from initial vitals, no respiratory distress, uvula midline no intraoral edema, no wheeze in her lungs, oxygenation is 92% on room air after she was symptomatic.  She was treated with 50 mg Benadryl IM and 80 mg Depo-Medrol IM. Evaluated again prior to leaving the clinic, and stable condition.   She verbalizes understanding of ER precautions for both allergic reaction and UTI. Prior to leaving clinic airway was reevaluated, oxygenation 92%, heart rate 88, respirations 16 no stridor, no increased work of breathing, no wheeze  Delsa Grana, PA-C 02/05/18 10:56 AM      02/05/18 12:22 PM:  Called and discussed allergies and culture and sensitivity profile with the pharmacist at  [facility].  He states that the Augmentin was not filled in the past patient did not take it.  Patient did not provide this history while in the room.  The prescription for the last UTI in February of this year was changed to treatment with fosfomycin packets take 1 and repeat in 2 days x 3 doses.  She will be given this and was instructed to follow-up early next week if not improving      Chart addended with clarification of HPI.   Chart was done previously, but had error and note was therefor unable to be completed/signed.  Previously completed sections were copied to a new note to submit.

## 2018-02-07 LAB — URINE CULTURE
MICRO NUMBER: 90657788
SPECIMEN QUALITY:: ADEQUATE

## 2018-02-08 NOTE — Progress Notes (Signed)
Pt had a positive urine culture - please inquire if she is feeling better.  If she is still symptomatic we can try another antibiotic because the strain she has, E. Coli, does not have resistance to multiple antibiotics

## 2018-02-09 ENCOUNTER — Other Ambulatory Visit: Payer: Self-pay | Admitting: Physician Assistant

## 2018-02-09 MED ORDER — LEVOFLOXACIN 500 MG PO TABS: 500.0000 mg | ORAL_TABLET | Freq: Every day | ORAL | 0 refills | Status: DC

## 2018-02-11 ENCOUNTER — Encounter: Payer: Medicaid Other | Attending: Physical Medicine and Rehabilitation | Admitting: Registered Nurse

## 2018-02-11 ENCOUNTER — Encounter: Payer: Self-pay | Admitting: Registered Nurse

## 2018-02-11 ENCOUNTER — Other Ambulatory Visit: Payer: Self-pay

## 2018-02-11 VITALS — BP 107/74 | HR 94 | Ht 65.0 in | Wt 299.0 lb

## 2018-02-11 DIAGNOSIS — M810 Age-related osteoporosis without current pathological fracture: Secondary | ICD-10-CM | POA: Insufficient documentation

## 2018-02-11 DIAGNOSIS — E039 Hypothyroidism, unspecified: Secondary | ICD-10-CM | POA: Diagnosis not present

## 2018-02-11 DIAGNOSIS — M7542 Impingement syndrome of left shoulder: Secondary | ICD-10-CM | POA: Diagnosis not present

## 2018-02-11 DIAGNOSIS — K219 Gastro-esophageal reflux disease without esophagitis: Secondary | ICD-10-CM | POA: Diagnosis not present

## 2018-02-11 DIAGNOSIS — M542 Cervicalgia: Secondary | ICD-10-CM

## 2018-02-11 DIAGNOSIS — E119 Type 2 diabetes mellitus without complications: Secondary | ICD-10-CM | POA: Insufficient documentation

## 2018-02-11 DIAGNOSIS — G8929 Other chronic pain: Secondary | ICD-10-CM | POA: Diagnosis not present

## 2018-02-11 DIAGNOSIS — M47816 Spondylosis without myelopathy or radiculopathy, lumbar region: Secondary | ICD-10-CM | POA: Diagnosis not present

## 2018-02-11 DIAGNOSIS — F17210 Nicotine dependence, cigarettes, uncomplicated: Secondary | ICD-10-CM | POA: Diagnosis not present

## 2018-02-11 DIAGNOSIS — M7061 Trochanteric bursitis, right hip: Secondary | ICD-10-CM

## 2018-02-11 DIAGNOSIS — M5412 Radiculopathy, cervical region: Secondary | ICD-10-CM

## 2018-02-11 DIAGNOSIS — E785 Hyperlipidemia, unspecified: Secondary | ICD-10-CM | POA: Insufficient documentation

## 2018-02-11 DIAGNOSIS — M79604 Pain in right leg: Secondary | ICD-10-CM | POA: Insufficient documentation

## 2018-02-11 DIAGNOSIS — IMO0002 Reserved for concepts with insufficient information to code with codable children: Secondary | ICD-10-CM

## 2018-02-11 DIAGNOSIS — M961 Postlaminectomy syndrome, not elsewhere classified: Secondary | ICD-10-CM

## 2018-02-11 DIAGNOSIS — Z5181 Encounter for therapeutic drug level monitoring: Secondary | ICD-10-CM | POA: Diagnosis not present

## 2018-02-11 DIAGNOSIS — M75101 Unspecified rotator cuff tear or rupture of right shoulder, not specified as traumatic: Secondary | ICD-10-CM

## 2018-02-11 DIAGNOSIS — M47814 Spondylosis without myelopathy or radiculopathy, thoracic region: Secondary | ICD-10-CM | POA: Diagnosis not present

## 2018-02-11 DIAGNOSIS — M25551 Pain in right hip: Secondary | ICD-10-CM | POA: Diagnosis not present

## 2018-02-11 DIAGNOSIS — G894 Chronic pain syndrome: Secondary | ICD-10-CM

## 2018-02-11 DIAGNOSIS — J449 Chronic obstructive pulmonary disease, unspecified: Secondary | ICD-10-CM | POA: Insufficient documentation

## 2018-02-11 DIAGNOSIS — M25512 Pain in left shoulder: Secondary | ICD-10-CM | POA: Diagnosis not present

## 2018-02-11 DIAGNOSIS — M7062 Trochanteric bursitis, left hip: Secondary | ICD-10-CM

## 2018-02-11 DIAGNOSIS — Z79891 Long term (current) use of opiate analgesic: Secondary | ICD-10-CM | POA: Diagnosis not present

## 2018-02-11 DIAGNOSIS — M5416 Radiculopathy, lumbar region: Secondary | ICD-10-CM | POA: Diagnosis not present

## 2018-02-11 DIAGNOSIS — M546 Pain in thoracic spine: Secondary | ICD-10-CM

## 2018-02-11 MED ORDER — GABAPENTIN 300 MG PO CAPS: ORAL_CAPSULE | ORAL | 2 refills | Status: DC

## 2018-02-11 MED ORDER — HYDROCODONE-ACETAMINOPHEN 10-325 MG PO TABS: 1.0000 | ORAL_TABLET | Freq: Three times a day (TID) | ORAL | 0 refills | Status: DC
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
## 2018-02-15 ENCOUNTER — Other Ambulatory Visit: Payer: Self-pay | Admitting: Physician Assistant

## 2018-02-17 ENCOUNTER — Other Ambulatory Visit: Payer: Self-pay | Admitting: Physician Assistant

## 2018-02-17 ENCOUNTER — Telehealth: Payer: Self-pay

## 2018-02-17 NOTE — Telephone Encounter (Signed)
Lisa Ewing with [facility] would like orders to continue to see patient once every 2 months under the CAP program

## 2018-02-17 NOTE — Telephone Encounter (Signed)
Approved.  

## 2018-02-18 ENCOUNTER — Telehealth: Payer: Self-pay | Admitting: *Deleted

## 2018-02-18 NOTE — Telephone Encounter (Signed)
Urine drug screen for this encounter is consistent for prescribed medication 

## 2018-02-18 NOTE — Telephone Encounter (Signed)
Order given to Judson Roch

## 2018-02-19 LAB — TOXASSURE SELECT,+ANTIDEPR,UR

## 2018-02-22 ENCOUNTER — Telehealth: Payer: Self-pay

## 2018-02-22 NOTE — Telephone Encounter (Signed)
Jocelyn Lamer called requesting rx for poise pads for the patient. Will fax over to pharmacy

## 2018-02-24 ENCOUNTER — Other Ambulatory Visit: Payer: Self-pay | Admitting: Physician Assistant

## 2018-03-09 ENCOUNTER — Ambulatory Visit (HOSPITAL_COMMUNITY)
Admission: RE | Admit: 2018-03-09 | Discharge: 2018-03-09 | Disposition: A | Discharge status: Home / Self Care | Payer: Medicaid Other | Source: Ambulatory Visit | Attending: Cardiology | Admitting: Cardiology

## 2018-03-09 ENCOUNTER — Other Ambulatory Visit: Payer: Self-pay | Admitting: Physician Assistant

## 2018-03-09 DIAGNOSIS — E119 Type 2 diabetes mellitus without complications: Secondary | ICD-10-CM | POA: Insufficient documentation

## 2018-03-09 DIAGNOSIS — I3139 Other pericardial effusion (noninflammatory): Secondary | ICD-10-CM

## 2018-03-09 DIAGNOSIS — E785 Hyperlipidemia, unspecified: Secondary | ICD-10-CM | POA: Insufficient documentation

## 2018-03-09 DIAGNOSIS — J449 Chronic obstructive pulmonary disease, unspecified: Secondary | ICD-10-CM | POA: Diagnosis not present

## 2018-03-09 DIAGNOSIS — I059 Rheumatic mitral valve disease, unspecified: Secondary | ICD-10-CM | POA: Diagnosis not present

## 2018-03-09 DIAGNOSIS — K219 Gastro-esophageal reflux disease without esophagitis: Secondary | ICD-10-CM | POA: Insufficient documentation

## 2018-03-09 DIAGNOSIS — Z720 Tobacco use: Secondary | ICD-10-CM | POA: Insufficient documentation

## 2018-03-09 DIAGNOSIS — I313 Pericardial effusion (noninflammatory): Secondary | ICD-10-CM

## 2018-03-09 NOTE — Progress Notes (Signed)
*  PRELIMINARY RESULTS* Echocardiogram 2D Echocardiogram has been performed.  Lisa Ewing 03/09/2018, 12:18 PM

## 2018-03-12 ENCOUNTER — Ambulatory Visit: Payer: Medicaid Other | Admitting: Physical Medicine & Rehabilitation

## 2018-03-12 ENCOUNTER — Encounter: Payer: Self-pay | Admitting: Physical Medicine & Rehabilitation

## 2018-03-12 ENCOUNTER — Encounter: Payer: Medicaid Other | Attending: Physical Medicine and Rehabilitation

## 2018-03-12 VITALS — BP 102/52 | HR 98 | Resp 14

## 2018-03-12 DIAGNOSIS — M810 Age-related osteoporosis without current pathological fracture: Secondary | ICD-10-CM | POA: Insufficient documentation

## 2018-03-12 DIAGNOSIS — M47814 Spondylosis without myelopathy or radiculopathy, thoracic region: Secondary | ICD-10-CM | POA: Diagnosis not present

## 2018-03-12 DIAGNOSIS — M47816 Spondylosis without myelopathy or radiculopathy, lumbar region: Secondary | ICD-10-CM | POA: Insufficient documentation

## 2018-03-12 DIAGNOSIS — M25551 Pain in right hip: Secondary | ICD-10-CM | POA: Diagnosis not present

## 2018-03-12 DIAGNOSIS — E119 Type 2 diabetes mellitus without complications: Secondary | ICD-10-CM | POA: Insufficient documentation

## 2018-03-12 DIAGNOSIS — E039 Hypothyroidism, unspecified: Secondary | ICD-10-CM | POA: Insufficient documentation

## 2018-03-12 DIAGNOSIS — G8929 Other chronic pain: Secondary | ICD-10-CM | POA: Diagnosis present

## 2018-03-12 DIAGNOSIS — F17210 Nicotine dependence, cigarettes, uncomplicated: Secondary | ICD-10-CM | POA: Insufficient documentation

## 2018-03-12 DIAGNOSIS — M7062 Trochanteric bursitis, left hip: Secondary | ICD-10-CM

## 2018-03-12 DIAGNOSIS — M25512 Pain in left shoulder: Secondary | ICD-10-CM | POA: Insufficient documentation

## 2018-03-12 DIAGNOSIS — M79604 Pain in right leg: Secondary | ICD-10-CM | POA: Insufficient documentation

## 2018-03-12 DIAGNOSIS — J449 Chronic obstructive pulmonary disease, unspecified: Secondary | ICD-10-CM | POA: Insufficient documentation

## 2018-03-12 DIAGNOSIS — E785 Hyperlipidemia, unspecified: Secondary | ICD-10-CM | POA: Insufficient documentation

## 2018-03-12 DIAGNOSIS — K219 Gastro-esophageal reflux disease without esophagitis: Secondary | ICD-10-CM | POA: Diagnosis not present

## 2018-03-12 MED ORDER — METHOCARBAMOL 500 MG PO TABS: ORAL_TABLET | ORAL | 2 refills | Status: DC

## 2018-03-12 MED ORDER — HYDROCODONE-ACETAMINOPHEN 10-325 MG PO TABS: 1.0000 | ORAL_TABLET | Freq: Three times a day (TID) | ORAL | 0 refills | Status: DC

## 2018-03-12 NOTE — Patient Instructions (Signed)
Trigger Point Injection  Trigger points are areas where you have pain. A trigger point injection is a shot given in the trigger point to help relieve pain for a few days to a few months. Common places for trigger points include:  · The neck.  · The shoulders.  · The upper back.  · The lower back.    A trigger point injection will not cure long-lasting (chronic) pain permanently. These injections do not always work for every person, but for some people they can help to relieve pain for a few days to a few months.  Tell a health care provider about:  · Any allergies you have.  · All medicines you are taking, including vitamins, herbs, eye drops, creams, and over-the-counter medicines.  · Any problems you or family members have had with anesthetic medicines.  · Any blood disorders you have.  · Any surgeries you have had.  · Any medical conditions you have.  What are the risks?  Generally, this is a safe procedure. However, problems may occur, including:  · Infection.  · Bleeding.  · Allergic reaction to the injected medicine.  · Irritation of the skin around the injection site.    What happens before the procedure?  · Ask your health care provider about changing or stopping your regular medicines. This is especially important if you are taking diabetes medicines or blood thinners.  What happens during the procedure?  · Your health care provider will feel for trigger points. A marker may be used to circle the area for the injection.  · The skin over the trigger point will be washed with a germ-killing (antiseptic) solution.  · A thin needle is used for the shot. You may feel pain or a twitching feeling when the needle enters the trigger point.  · A numbing solution may be injected into the trigger point. Sometimes a medicine to keep down swelling, redness, and warmth (inflammation) is also injected.  · Your health care provider may move the needle around the area where the trigger point is located until the tightness  and twitching goes away.  · After the injection, your health care provider may put gentle pressure over the injection site.  · The injection site will be covered with a bandage (dressing).  The procedure may vary among health care providers and hospitals.  What happens after the procedure?  · The dressing can be taken off in a few hours or as told by your health care provider.  · You may feel sore and stiff for 1-2 days.  This information is not intended to replace advice given to you by your health care provider. Make sure you discuss any questions you have with your health care provider.  Document Released: 08/14/2011 Document Revised: 04/27/2016 Document Reviewed: 02/12/2015  Elsevier Interactive Patient Education © 2018 [employer]

## 2018-03-12 NOTE — Progress Notes (Signed)
Trochanteric bursa injection Left  With  ultrasound guidance  Indication Trochanteric bursitis. Exam has tenderness over the greater trochanter of the hip. Pain has not responded to conservative care such as exercise therapy and oral medications. Pain interferes with sleep or with mobility Informed consent was obtained after describing risks and benefits of the procedure with the patient these include bleeding bruising and infection. Patient has signed written consent form. Patient placed in a lateral decubitus position with the affected hip superior. Short axis view of greater trochanter , inplane approach , needle tip approximated post lateral aspect of troch jus t off periosteum.Needle slightly withdrawn then 6mg  of betamethasone with 4 cc 1% lidocaine were injected. Patient tolerated procedure well. Post procedure instructions given.  Pt also c/o increasing Right sided neck pain and has tenderness over the R trapezius.  Will renew Methocarbamol Rx and schedule for possible TPI in 1 mo if no improvement

## 2018-03-17 ENCOUNTER — Telehealth: Payer: Self-pay

## 2018-03-17 ENCOUNTER — Other Ambulatory Visit: Payer: Self-pay | Admitting: Physician Assistant

## 2018-03-17 NOTE — Telephone Encounter (Signed)
Called pt. No answer, left message for pt to return call.  

## 2018-03-17 NOTE — Telephone Encounter (Signed)
Pt notified. She asks if she could increase her lasix as she is only taking 10 mg daily and it does not seem to be helping like it did before. She has started having swelling in her ankles and feet. (some days being worse than others) She has not noticed a major  weight gain. Please advise.

## 2018-03-17 NOTE — Telephone Encounter (Signed)
-----   Message from Arnoldo Lenis, MD sent at 03/16/2018  1:55 PM EDT ----- Echo shows fluid around the heart is stable in size, and probably even a little smaller than last check. We need to continue to monitor this fluid but its reassuring that it has been stable. Repeat limited echo for pericardial effusion in 4 months   Zandra Abts MD

## 2018-03-17 NOTE — Telephone Encounter (Signed)
Called pt., no answer. Left detailed message on pt's private voicemail.  

## 2018-03-17 NOTE — Telephone Encounter (Signed)
Ok to take 10 to 20 mg daily as needed for swelling   J Tiersa Dayley MD

## 2018-03-23 ENCOUNTER — Other Ambulatory Visit: Payer: Self-pay | Admitting: Physician Assistant

## 2018-04-05 ENCOUNTER — Other Ambulatory Visit: Payer: Self-pay | Admitting: Physician Assistant

## 2018-04-09 ENCOUNTER — Ambulatory Visit: Payer: Medicaid Other | Admitting: Physical Medicine & Rehabilitation

## 2018-04-11 ENCOUNTER — Other Ambulatory Visit: Payer: Self-pay

## 2018-04-11 ENCOUNTER — Inpatient Hospital Stay (HOSPITAL_COMMUNITY)
Admission: EM | Admit: 2018-04-11 | Discharge: 2018-04-19 | DRG: 314 | Disposition: A | Discharge status: Home / Self Care | Payer: Medicaid Other | Attending: Internal Medicine | Admitting: Internal Medicine

## 2018-04-11 ENCOUNTER — Encounter (HOSPITAL_COMMUNITY): Payer: Self-pay | Admitting: Emergency Medicine

## 2018-04-11 ENCOUNTER — Emergency Department (HOSPITAL_COMMUNITY): Payer: Medicaid Other

## 2018-04-11 DIAGNOSIS — Z91040 Latex allergy status: Secondary | ICD-10-CM

## 2018-04-11 DIAGNOSIS — E662 Morbid (severe) obesity with alveolar hypoventilation: Secondary | ICD-10-CM | POA: Diagnosis present

## 2018-04-11 DIAGNOSIS — Z8542 Personal history of malignant neoplasm of other parts of uterus: Secondary | ICD-10-CM

## 2018-04-11 DIAGNOSIS — E785 Hyperlipidemia, unspecified: Secondary | ICD-10-CM | POA: Diagnosis present

## 2018-04-11 DIAGNOSIS — J449 Chronic obstructive pulmonary disease, unspecified: Secondary | ICD-10-CM | POA: Diagnosis present

## 2018-04-11 DIAGNOSIS — K529 Noninfective gastroenteritis and colitis, unspecified: Secondary | ICD-10-CM | POA: Diagnosis present

## 2018-04-11 DIAGNOSIS — Z7951 Long term (current) use of inhaled steroids: Secondary | ICD-10-CM

## 2018-04-11 DIAGNOSIS — Z9119 Patient's noncompliance with other medical treatment and regimen: Secondary | ICD-10-CM

## 2018-04-11 DIAGNOSIS — E860 Dehydration: Secondary | ICD-10-CM

## 2018-04-11 DIAGNOSIS — R0902 Hypoxemia: Secondary | ICD-10-CM

## 2018-04-11 DIAGNOSIS — J441 Chronic obstructive pulmonary disease with (acute) exacerbation: Secondary | ICD-10-CM | POA: Diagnosis present

## 2018-04-11 DIAGNOSIS — Z91030 Bee allergy status: Secondary | ICD-10-CM

## 2018-04-11 DIAGNOSIS — T7840XA Allergy, unspecified, initial encounter: Secondary | ICD-10-CM

## 2018-04-11 DIAGNOSIS — Z91013 Allergy to seafood: Secondary | ICD-10-CM

## 2018-04-11 DIAGNOSIS — J9601 Acute respiratory failure with hypoxia: Secondary | ICD-10-CM

## 2018-04-11 DIAGNOSIS — I313 Pericardial effusion (noninflammatory): Principal | ICD-10-CM | POA: Diagnosis present

## 2018-04-11 DIAGNOSIS — I3139 Other pericardial effusion (noninflammatory): Secondary | ICD-10-CM | POA: Diagnosis present

## 2018-04-11 DIAGNOSIS — F17200 Nicotine dependence, unspecified, uncomplicated: Secondary | ICD-10-CM | POA: Diagnosis present

## 2018-04-11 DIAGNOSIS — R197 Diarrhea, unspecified: Secondary | ICD-10-CM | POA: Diagnosis present

## 2018-04-11 DIAGNOSIS — A020 Salmonella enteritis: Secondary | ICD-10-CM | POA: Diagnosis present

## 2018-04-11 DIAGNOSIS — Z6841 Body Mass Index (BMI) 40.0 and over, adult: Secondary | ICD-10-CM

## 2018-04-11 DIAGNOSIS — R112 Nausea with vomiting, unspecified: Secondary | ICD-10-CM

## 2018-04-11 DIAGNOSIS — K219 Gastro-esophageal reflux disease without esophagitis: Secondary | ICD-10-CM | POA: Diagnosis present

## 2018-04-11 DIAGNOSIS — E66813 Obesity, class 3: Secondary | ICD-10-CM

## 2018-04-11 DIAGNOSIS — Z880 Allergy status to penicillin: Secondary | ICD-10-CM

## 2018-04-11 DIAGNOSIS — J9622 Acute and chronic respiratory failure with hypercapnia: Secondary | ICD-10-CM | POA: Diagnosis present

## 2018-04-11 DIAGNOSIS — Z833 Family history of diabetes mellitus: Secondary | ICD-10-CM

## 2018-04-11 DIAGNOSIS — F32A Depression, unspecified: Secondary | ICD-10-CM | POA: Diagnosis present

## 2018-04-11 DIAGNOSIS — F17210 Nicotine dependence, cigarettes, uncomplicated: Secondary | ICD-10-CM | POA: Diagnosis present

## 2018-04-11 DIAGNOSIS — Z8349 Family history of other endocrine, nutritional and metabolic diseases: Secondary | ICD-10-CM

## 2018-04-11 DIAGNOSIS — G9341 Metabolic encephalopathy: Secondary | ICD-10-CM

## 2018-04-11 DIAGNOSIS — Z79891 Long term (current) use of opiate analgesic: Secondary | ICD-10-CM

## 2018-04-11 DIAGNOSIS — Z7984 Long term (current) use of oral hypoglycemic drugs: Secondary | ICD-10-CM

## 2018-04-11 DIAGNOSIS — N390 Urinary tract infection, site not specified: Secondary | ICD-10-CM | POA: Diagnosis present

## 2018-04-11 DIAGNOSIS — I314 Cardiac tamponade: Secondary | ICD-10-CM

## 2018-04-11 DIAGNOSIS — Z9981 Dependence on supplemental oxygen: Secondary | ICD-10-CM

## 2018-04-11 DIAGNOSIS — Z79890 Hormone replacement therapy (postmenopausal): Secondary | ICD-10-CM

## 2018-04-11 DIAGNOSIS — Z882 Allergy status to sulfonamides status: Secondary | ICD-10-CM

## 2018-04-11 DIAGNOSIS — J9602 Acute respiratory failure with hypercapnia: Secondary | ICD-10-CM

## 2018-04-11 DIAGNOSIS — E1140 Type 2 diabetes mellitus with diabetic neuropathy, unspecified: Secondary | ICD-10-CM | POA: Diagnosis present

## 2018-04-11 DIAGNOSIS — F329 Major depressive disorder, single episode, unspecified: Secondary | ICD-10-CM | POA: Diagnosis present

## 2018-04-11 DIAGNOSIS — G894 Chronic pain syndrome: Secondary | ICD-10-CM | POA: Diagnosis present

## 2018-04-11 DIAGNOSIS — J9621 Acute and chronic respiratory failure with hypoxia: Secondary | ICD-10-CM | POA: Diagnosis present

## 2018-04-11 DIAGNOSIS — G4730 Sleep apnea, unspecified: Secondary | ICD-10-CM | POA: Diagnosis present

## 2018-04-11 DIAGNOSIS — Z993 Dependence on wheelchair: Secondary | ICD-10-CM

## 2018-04-11 DIAGNOSIS — E032 Hypothyroidism due to medicaments and other exogenous substances: Secondary | ICD-10-CM | POA: Diagnosis present

## 2018-04-11 LAB — COMPREHENSIVE METABOLIC PANEL
ALK PHOS: 63 U/L (ref 38–126)
ALT: 13 U/L (ref 0–44)
ANION GAP: 10 (ref 5–15)
AST: 20 U/L (ref 15–41)
Albumin: 3.3 g/dL — ABNORMAL LOW (ref 3.5–5.0)
BUN: 13 mg/dL (ref 8–23)
CALCIUM: 8.1 mg/dL — AB (ref 8.9–10.3)
CO2: 28 mmol/L (ref 22–32)
Chloride: 96 mmol/L — ABNORMAL LOW (ref 98–111)
Creatinine, Ser: 1.18 mg/dL — ABNORMAL HIGH (ref 0.44–1.00)
GFR calc non Af Amer: 48 mL/min — ABNORMAL LOW (ref >60)
GFR, EST AFRICAN AMERICAN: 56 mL/min — AB (ref >60)
Glucose, Bld: 167 mg/dL — ABNORMAL HIGH (ref 70–99)
Potassium: 3.5 mmol/L (ref 3.5–5.1)
Sodium: 134 mmol/L — ABNORMAL LOW (ref 135–145)
TOTAL PROTEIN: 7.2 g/dL (ref 6.5–8.1)
Total Bilirubin: 0.6 mg/dL (ref 0.3–1.2)

## 2018-04-11 LAB — CBC
HCT: 39.4 % (ref 36.0–46.0)
HEMOGLOBIN: 11.5 g/dL — AB (ref 12.0–15.0)
MCH: 21.4 pg — ABNORMAL LOW (ref 26.0–34.0)
MCHC: 29.2 g/dL — AB (ref 30.0–36.0)
MCV: 73.4 fL — ABNORMAL LOW (ref 78.0–100.0)
PLATELETS: 324 10*3/uL (ref 150–400)
RBC: 5.37 MIL/uL — ABNORMAL HIGH (ref 3.87–5.11)
RDW: 20.4 % — ABNORMAL HIGH (ref 11.5–15.5)
WBC: 13.1 10*3/uL — ABNORMAL HIGH (ref 4.0–10.5)

## 2018-04-11 LAB — CBG MONITORING, ED: Glucose-Capillary: 192 mg/dL — ABNORMAL HIGH (ref 70–99)

## 2018-04-11 LAB — TROPONIN I: Troponin I: <0.03 ng/mL (ref <0.03)

## 2018-04-11 LAB — LIPASE, BLOOD: Lipase: 23 U/L (ref 11–51)

## 2018-04-11 LAB — BRAIN NATRIURETIC PEPTIDE: B NATRIURETIC PEPTIDE 5: 27 pg/mL (ref 0.0–100.0)

## 2018-04-11 MED ORDER — IOPAMIDOL (ISOVUE-370) INJECTION 76%
100.0000 mL | Freq: Once | INTRAVENOUS | Status: AC | PRN
  Administered 2018-04-11: 100 mL via INTRAVENOUS

## 2018-04-11 MED ORDER — SODIUM CHLORIDE 0.9 % IV BOLUS
1000.0000 mL | Freq: Once | INTRAVENOUS | Status: AC
  Administered 2018-04-11: 1000 mL via INTRAVENOUS

## 2018-04-11 MED ORDER — KETOROLAC TROMETHAMINE 30 MG/ML IJ SOLN
30.0000 mg | Freq: Once | INTRAMUSCULAR | Status: AC
  Administered 2018-04-11: 30 mg via INTRAVENOUS
  Filled 2018-04-11: qty 1

## 2018-04-11 MED ORDER — IPRATROPIUM-ALBUTEROL 0.5-2.5 (3) MG/3ML IN SOLN
3.0000 mL | Freq: Once | RESPIRATORY_TRACT | Status: AC
  Administered 2018-04-11: 3 mL via RESPIRATORY_TRACT
  Filled 2018-04-11: qty 3

## 2018-04-11 MED ORDER — ONDANSETRON HCL 4 MG/2ML IJ SOLN
4.0000 mg | Freq: Once | INTRAMUSCULAR | Status: AC
  Administered 2018-04-11: 4 mg via INTRAVENOUS
  Filled 2018-04-11: qty 2

## 2018-04-11 NOTE — ED Notes (Signed)
Pt drank sprite and was able to keeps in down just fine.

## 2018-04-11 NOTE — ED Notes (Signed)
Turned off pt's O2 to see how she would do per Dr Melina Copa. Pt started to desat and remained at 73% with O2 off. Placed pt back on O2 at 2L. Dr Melina Copa notified.

## 2018-04-11 NOTE — ED Notes (Signed)
Patient transported to X-ray 

## 2018-04-11 NOTE — ED Notes (Signed)
Breathing treatment finished. O2 cut back off to see how pt tolerates room air. Will continue to monitor.

## 2018-04-11 NOTE — ED Triage Notes (Signed)
Patient c/o lower abd pain with nausea, vomiting, and diarrhea x4 days. Denies any fever. Per patient diabetic, blood sugar 54 yesterday.
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
## 2018-04-11 NOTE — ED Notes (Signed)
Asked pt for urine sample,pt is aware we need sample,bedside commode is in room.

## 2018-04-12 ENCOUNTER — Observation Stay (HOSPITAL_COMMUNITY): Payer: Medicaid Other

## 2018-04-12 DIAGNOSIS — N390 Urinary tract infection, site not specified: Secondary | ICD-10-CM | POA: Diagnosis present

## 2018-04-12 DIAGNOSIS — I313 Pericardial effusion (noninflammatory): Secondary | ICD-10-CM

## 2018-04-12 DIAGNOSIS — I3139 Other pericardial effusion (noninflammatory): Secondary | ICD-10-CM | POA: Diagnosis present

## 2018-04-12 DIAGNOSIS — K529 Noninfective gastroenteritis and colitis, unspecified: Secondary | ICD-10-CM | POA: Diagnosis not present

## 2018-04-12 DIAGNOSIS — F17200 Nicotine dependence, unspecified, uncomplicated: Secondary | ICD-10-CM | POA: Diagnosis not present

## 2018-04-12 DIAGNOSIS — R112 Nausea with vomiting, unspecified: Secondary | ICD-10-CM

## 2018-04-12 DIAGNOSIS — E038 Other specified hypothyroidism: Secondary | ICD-10-CM

## 2018-04-12 DIAGNOSIS — E860 Dehydration: Secondary | ICD-10-CM

## 2018-04-12 DIAGNOSIS — J9601 Acute respiratory failure with hypoxia: Secondary | ICD-10-CM

## 2018-04-12 DIAGNOSIS — J441 Chronic obstructive pulmonary disease with (acute) exacerbation: Secondary | ICD-10-CM

## 2018-04-12 DIAGNOSIS — E782 Mixed hyperlipidemia: Secondary | ICD-10-CM

## 2018-04-12 DIAGNOSIS — J438 Other emphysema: Secondary | ICD-10-CM

## 2018-04-12 DIAGNOSIS — E66813 Obesity, class 3: Secondary | ICD-10-CM

## 2018-04-12 DIAGNOSIS — G9341 Metabolic encephalopathy: Secondary | ICD-10-CM

## 2018-04-12 DIAGNOSIS — J9602 Acute respiratory failure with hypercapnia: Secondary | ICD-10-CM

## 2018-04-12 DIAGNOSIS — G4730 Sleep apnea, unspecified: Secondary | ICD-10-CM | POA: Diagnosis present

## 2018-04-12 HISTORY — DX: Dehydration: E86.0

## 2018-04-12 HISTORY — DX: Other pericardial effusion (noninflammatory): I31.39

## 2018-04-12 HISTORY — DX: Noninfective gastroenteritis and colitis, unspecified: K52.9

## 2018-04-12 HISTORY — DX: Pericardial effusion (noninflammatory): I31.3

## 2018-04-12 HISTORY — DX: Nausea with vomiting, unspecified: R11.2

## 2018-04-12 LAB — GLUCOSE, CAPILLARY
GLUCOSE-CAPILLARY: 130 mg/dL — AB (ref 70–99)
GLUCOSE-CAPILLARY: 145 mg/dL — AB (ref 70–99)
GLUCOSE-CAPILLARY: 129 mg/dL — AB (ref 70–99)
Glucose-Capillary: 156 mg/dL — ABNORMAL HIGH (ref 70–99)
Glucose-Capillary: 150 mg/dL — ABNORMAL HIGH (ref 70–99)

## 2018-04-12 LAB — URINALYSIS, ROUTINE W REFLEX MICROSCOPIC
BILIRUBIN URINE: NEGATIVE
Glucose, UA: NEGATIVE mg/dL
Ketones, ur: NEGATIVE mg/dL
Nitrite: POSITIVE — AB
PH: 5 (ref 5.0–8.0)
Protein, ur: 100 mg/dL — AB
SPECIFIC GRAVITY, URINE: 1.025 (ref 1.005–1.030)
WBC, UA: >50 WBC/hpf — ABNORMAL HIGH (ref 0–5)

## 2018-04-12 LAB — CBC WITH DIFFERENTIAL/PLATELET
Basophils Absolute: 0 10*3/uL (ref 0.0–0.1)
Basophils Relative: 0 %
Eosinophils Absolute: 0.2 10*3/uL (ref 0.0–0.7)
Eosinophils Relative: 2 %
HEMATOCRIT: 38.7 % (ref 36.0–46.0)
HEMOGLOBIN: 11.3 g/dL — AB (ref 12.0–15.0)
Lymphocytes Relative: 9 %
Lymphs Abs: 1.1 10*3/uL (ref 0.7–4.0)
MCH: 21.9 pg — ABNORMAL LOW (ref 26.0–34.0)
MCHC: 29.2 g/dL — AB (ref 30.0–36.0)
MCV: 75 fL — AB (ref 78.0–100.0)
MONO ABS: 1.2 10*3/uL — AB (ref 0.1–1.0)
MONOS PCT: 10 %
NEUTROS ABS: 10.2 10*3/uL — AB (ref 1.7–7.7)
NEUTROS PCT: 79 %
Platelets: 326 10*3/uL (ref 150–400)
RBC: 5.16 MIL/uL — ABNORMAL HIGH (ref 3.87–5.11)
RDW: 20.5 % — ABNORMAL HIGH (ref 11.5–15.5)
WBC: 12.8 10*3/uL — ABNORMAL HIGH (ref 4.0–10.5)

## 2018-04-12 LAB — URINALYSIS, COMPLETE (UACMP) WITH MICROSCOPIC
BACTERIA UA: NONE SEEN
Bilirubin Urine: NEGATIVE
Glucose, UA: NEGATIVE mg/dL
KETONES UR: NEGATIVE mg/dL
Nitrite: POSITIVE — AB
PROTEIN: 100 mg/dL — AB
Specific Gravity, Urine: >1.046 — ABNORMAL HIGH (ref 1.005–1.030)
pH: 5 (ref 5.0–8.0)

## 2018-04-12 LAB — BASIC METABOLIC PANEL
ANION GAP: 8 (ref 5–15)
BUN: 12 mg/dL (ref 8–23)
CHLORIDE: 101 mmol/L (ref 98–111)
CO2: 29 mmol/L (ref 22–32)
Calcium: 7.8 mg/dL — ABNORMAL LOW (ref 8.9–10.3)
Creatinine, Ser: 0.89 mg/dL (ref 0.44–1.00)
GLUCOSE: 148 mg/dL — AB (ref 70–99)
POTASSIUM: 4 mmol/L (ref 3.5–5.1)
SODIUM: 138 mmol/L (ref 135–145)

## 2018-04-12 LAB — MAGNESIUM: MAGNESIUM: 1.6 mg/dL — AB (ref 1.7–2.4)

## 2018-04-12 LAB — PHOSPHORUS: Phosphorus: 4.1 mg/dL (ref 2.5–4.6)

## 2018-04-12 MED ORDER — HYDROCODONE-ACETAMINOPHEN 10-325 MG PO TABS: 1.0000 | ORAL_TABLET | Freq: Three times a day (TID) | ORAL | Status: DC | PRN

## 2018-04-12 MED ORDER — POTASSIUM CHLORIDE IN NACL 20-0.9 MEQ/L-% IV SOLN
INTRAVENOUS | Status: AC
  Administered 2018-04-12 (×2): via INTRAVENOUS

## 2018-04-12 MED ORDER — ACYCLOVIR 800 MG PO TABS
400.0000 mg | ORAL_TABLET | Freq: Every day | ORAL | Status: DC
  Administered 2018-04-12: 400 mg via ORAL
  Filled 2018-04-12 (×2): qty 1

## 2018-04-12 MED ORDER — LEVOTHYROXINE SODIUM 137 MCG PO TABS
137.0000 ug | ORAL_TABLET | Freq: Every day | ORAL | Status: DC
  Administered 2018-04-12 – 2018-04-13 (×2): 137 ug via ORAL
  Filled 2018-04-12 (×2): qty 1

## 2018-04-12 MED ORDER — ONDANSETRON HCL 4 MG/2ML IJ SOLN
4.0000 mg | Freq: Four times a day (QID) | INTRAMUSCULAR | Status: AC
  Administered 2018-04-12 (×4): 4 mg via INTRAVENOUS
  Filled 2018-04-12 (×4): qty 2

## 2018-04-12 MED ORDER — IPRATROPIUM-ALBUTEROL 0.5-2.5 (3) MG/3ML IN SOLN
3.0000 mL | Freq: Four times a day (QID) | RESPIRATORY_TRACT | Status: DC
Start: 2018-04-12 — End: 2018-04-15
  Administered 2018-04-12 – 2018-04-14 (×10): 3 mL via RESPIRATORY_TRACT
  Filled 2018-04-12 (×11): qty 3

## 2018-04-12 MED ORDER — LEVOFLOXACIN IN D5W 500 MG/100ML IV SOLN: 500.0000 mg | INTRAVENOUS | Status: DC

## 2018-04-12 MED ORDER — CIPROFLOXACIN IN D5W 400 MG/200ML IV SOLN
400.0000 mg | Freq: Two times a day (BID) | INTRAVENOUS | Status: DC
  Administered 2018-04-13 – 2018-04-15 (×5): 400 mg via INTRAVENOUS
  Filled 2018-04-12 (×5): qty 200

## 2018-04-12 MED ORDER — ONDANSETRON HCL 4 MG/2ML IJ SOLN
4.0000 mg | Freq: Four times a day (QID) | INTRAMUSCULAR | Status: DC | PRN
  Administered 2018-04-12: 4 mg via INTRAVENOUS
  Filled 2018-04-12: qty 2

## 2018-04-12 MED ORDER — ONDANSETRON HCL 4 MG PO TABS: 4.0000 mg | ORAL_TABLET | Freq: Four times a day (QID) | ORAL | Status: DC | PRN

## 2018-04-12 MED ORDER — FAMOTIDINE IN NACL 20-0.9 MG/50ML-% IV SOLN
20.0000 mg | Freq: Once | INTRAVENOUS | Status: AC
  Administered 2018-04-12: 20 mg via INTRAVENOUS
  Filled 2018-04-12: qty 50

## 2018-04-12 MED ORDER — BUDESONIDE 0.5 MG/2ML IN SUSP
0.5000 mg | Freq: Two times a day (BID) | RESPIRATORY_TRACT | Status: DC
  Administered 2018-04-12 – 2018-04-19 (×14): 0.5 mg via RESPIRATORY_TRACT
  Filled 2018-04-12 (×15): qty 2

## 2018-04-12 MED ORDER — CIPROFLOXACIN IN D5W 400 MG/200ML IV SOLN: 400.0000 mg | Freq: Two times a day (BID) | INTRAVENOUS | Status: DC

## 2018-04-12 MED ORDER — LEVOTHYROXINE SODIUM 137 MCG PO TABS: 137.0000 ug | ORAL_TABLET | Freq: Every day | ORAL | Status: DC

## 2018-04-12 MED ORDER — HYOSCYAMINE SULFATE 0.125 MG SL SUBL
0.2500 mg | SUBLINGUAL_TABLET | SUBLINGUAL | Status: DC | PRN
  Filled 2018-04-12: qty 2

## 2018-04-12 MED ORDER — ACETAMINOPHEN 325 MG PO TABS
650.0000 mg | ORAL_TABLET | Freq: Four times a day (QID) | ORAL | Status: DC | PRN
  Administered 2018-04-12 (×2): 650 mg via ORAL
  Filled 2018-04-12 (×2): qty 2

## 2018-04-12 MED ORDER — SODIUM CHLORIDE 0.9 % IV BOLUS
1000.0000 mL | Freq: Once | INTRAVENOUS | Status: AC
  Administered 2018-04-12: 1000 mL via INTRAVENOUS

## 2018-04-12 MED ORDER — HYDROCODONE-ACETAMINOPHEN 10-325 MG PO TABS
1.0000 | ORAL_TABLET | Freq: Four times a day (QID) | ORAL | Status: DC | PRN
  Administered 2018-04-12 – 2018-04-13 (×4): 1 via ORAL
  Filled 2018-04-12 (×5): qty 1

## 2018-04-12 MED ORDER — METRONIDAZOLE IN NACL 5-0.79 MG/ML-% IV SOLN
500.0000 mg | Freq: Three times a day (TID) | INTRAVENOUS | Status: DC
  Administered 2018-04-12 – 2018-04-13 (×3): 500 mg via INTRAVENOUS
  Filled 2018-04-12 (×3): qty 100

## 2018-04-12 MED ORDER — ACETAMINOPHEN 650 MG RE SUPP: 650.0000 mg | Freq: Four times a day (QID) | RECTAL | Status: DC | PRN

## 2018-04-12 MED ORDER — FENTANYL CITRATE (PF) 100 MCG/2ML IJ SOLN
100.0000 ug | Freq: Once | INTRAMUSCULAR | Status: AC
  Administered 2018-04-13: 100 ug via INTRAVENOUS
  Filled 2018-04-12: qty 2

## 2018-04-12 MED ORDER — PANTOPRAZOLE SODIUM 40 MG PO TBEC
40.0000 mg | DELAYED_RELEASE_TABLET | Freq: Two times a day (BID) | ORAL | Status: DC
  Administered 2018-04-12 – 2018-04-19 (×13): 40 mg via ORAL
  Filled 2018-04-12 (×14): qty 1

## 2018-04-12 MED ORDER — LEVOFLOXACIN IN D5W 500 MG/100ML IV SOLN
500.0000 mg | Freq: Once | INTRAVENOUS | Status: AC
  Administered 2018-04-12: 500 mg via INTRAVENOUS
  Filled 2018-04-12: qty 100

## 2018-04-12 MED ORDER — INSULIN ASPART 100 UNIT/ML ~~LOC~~ SOLN
0.0000 [IU] | Freq: Three times a day (TID) | SUBCUTANEOUS | Status: DC
  Administered 2018-04-12 (×2): 1 [IU] via SUBCUTANEOUS
  Administered 2018-04-12 – 2018-04-13 (×3): 2 [IU] via SUBCUTANEOUS
  Administered 2018-04-14: 3 [IU] via SUBCUTANEOUS
  Administered 2018-04-14 (×2): 2 [IU] via SUBCUTANEOUS
  Administered 2018-04-15: 3 [IU] via SUBCUTANEOUS
  Administered 2018-04-15: 2 [IU] via SUBCUTANEOUS
  Administered 2018-04-15 – 2018-04-16 (×2): 3 [IU] via SUBCUTANEOUS
  Administered 2018-04-16: 2 [IU] via SUBCUTANEOUS
  Administered 2018-04-16 – 2018-04-17 (×2): 3 [IU] via SUBCUTANEOUS
  Administered 2018-04-17 (×2): 2 [IU] via SUBCUTANEOUS
  Administered 2018-04-18: 3 [IU] via SUBCUTANEOUS
  Administered 2018-04-18 (×2): 2 [IU] via SUBCUTANEOUS
  Administered 2018-04-19 (×2): 3 [IU] via SUBCUTANEOUS

## 2018-04-12 MED ORDER — PANTOPRAZOLE SODIUM 40 MG PO TBEC: 40.0000 mg | DELAYED_RELEASE_TABLET | Freq: Every day | ORAL | Status: DC

## 2018-04-12 MED ORDER — SUCRALFATE 1 G PO TABS
1.0000 g | ORAL_TABLET | Freq: Three times a day (TID) | ORAL | Status: DC
  Administered 2018-04-12 (×3): 1 g via ORAL
  Filled 2018-04-12 (×5): qty 1
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
## 2018-04-12 NOTE — Progress Notes (Signed)
CT noted to have pancolitis -start cipro/flagyl -GI consult [facility]
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
## 2018-04-12 NOTE — Progress Notes (Addendum)
PROGRESS NOTE  Lisa Ewing CNO:[id number] DOB: September 27, 1955 DOA: 04/11/2018 PCP: Orlena Sheldon, PA-C  Brief History:  62 year old female with a history of diabetes mellitus, COPD, hyperlipidemia, OSA, depression, chronic back pain, hypothyroidism, fibromyalgia presenting with 4-day history of nausea, vomiting, and diarrhea.  Patient feels this may be related to eating some shredded chicken approximately 3 hours after which she began having the above symptoms.  The patient noted some bloody streaks in her stool after multiple episodes of diarrhea.  Patient also had complained of abdominal cramping and pain with dysuria.  She denies any fevers, chills, chest pain, shortness breath, cough, hemoptysis patient denies any sick contacts.  None of her other family members ate the shredded chicken.  She denies any rashes.  She denies any recent antibiotics.  Unfortunately, the patient continues to smoke 1/2 pack/day.  She has an over 50-pack-year history.  Upon presentation, the patient was noted to have sodium 134 with potassium 3.5.  WBC was 13.1.  CT angiogram of the chest showed a moderate to large pericardial effusion but no PE.  Assessment/Plan: Intractable vomiting and diarrhea -Likely gastroenteritis -Cannot rule out pyelonephritis with significant pyuria -Zofran around-the-clock -Clear liquid diet for now -Stool pathogen panel -CT abdomen and pelvis -Urine culture  Pyuria -Obtain urine culture -Continue PPI  Pericardial effusion -This has been chronic dating back to November 2018 -Most recent echocardiogram 03/09/18 did not show tamponade physiology -Monitor clinically  Diabetes mellitus type 2 -NovoLog sliding scale -Holding metformin, Januvia and Actos  COPD -Start duo nebs -Start Pulmicort  Tobacco abuse  -I have discussed tobacco cessation with the patient.  I have counseled the patient regarding the negative impacts of continued tobacco use including but not limited to  lung cancer, COPD, and cardiovascular disease.  I have discussed alternatives to tobacco and modalities that may help facilitate tobacco cessation including but not limited to biofeedback, hypnosis, and medications.  Total time spent with tobacco counseling was 4 minutes.  Chronic pain syndrome -Continue home dose hydrocodone  Hypothyroidism -Continue Synthroid  Hyperlipidemia -Restart TriCor, Niaspan, Zetia, Lipitor once able to tolerate po reliably  Morbid Obesity -BMI 49.5 -lifestyle modification    Disposition Plan:   Home in 1-2 days  Family Communication:   Family at bedside--Total time spent 35 minutes.  Greater than 50% spent face to face counseling and coordinating care. 0640 to [place]:  none  Code Status:  FULL   DVT Prophylaxis:  SCDs  Procedures: As Listed in Progress Note Above  Antibiotics: None    Subjective: Patient states that her vomiting is better.  She still has abdominal cramping and upper abdominal pain.  She has not had any watery stools presently.  She denies any headache, chest pain, shortness breath, coughing or hemoptysis.  There is no fevers chills.  Objective: Vitals:   04/12/18 0300 04/12/18 0326 04/12/18 0349 04/12/18 0742  BP: (!) 155/78  98/84   Pulse:   (!) 108   Resp: 15  18   Temp: 98.3 F (36.8 C)  98 F (36.7 C)   TempSrc: Oral  Oral   SpO2:   91% 97%  Weight:  135 kg (297 lb 9.9 oz)    Height:  5\' 5"  (1.651 m)      Intake/Output Summary (Last 24 hours) at 04/12/2018 0808 Last data filed at 04/11/2018 2019 Gross per 24 hour  Intake 1000 ml  Output -  Net 1000  ml   Weight change:  Exam:   General:  Pt is alert, follows commands appropriately, not in acute distress  HEENT: No icterus, No thrush, No neck mass, [place]/AT  Cardiovascular: RRR, S1/S2, no rubs, no gallops  Respiratory: Bibasilar crackles.  Diminished breath sounds bilateral.  Abdomen: Soft/+BS, upper abdominal tender, non distended, no  guarding  Extremities: trace LE edema, No lymphangitis, No petechiae, No rashes, no synovitis   Data Reviewed: I have personally reviewed following labs and imaging studies Basic Metabolic Panel: Recent Labs  Lab 04/11/18 1742 04/12/18 0500  NA 134* 138  K 3.5 4.0  CL 96* 101  CO2 28 29  GLUCOSE 167* 148*  BUN 13 12  CREATININE 1.18* 0.89  CALCIUM 8.1* 7.8*  MG  --  1.6*  PHOS  --  4.1   Liver Function Tests: Recent Labs  Lab 04/11/18 1742  AST 20  ALT 13  ALKPHOS 63  BILITOT 0.6  PROT 7.2  ALBUMIN 3.3*   Recent Labs  Lab 04/11/18 1742  LIPASE 23   No results for input(s): AMMONIA in the last 168 hours. Coagulation Profile: No results for input(s): INR, PROTIME in the last 168 hours. CBC: Recent Labs  Lab 04/11/18 1742 04/12/18 0500  WBC 13.1* 12.8*  NEUTROABS  --  10.2*  HGB 11.5* 11.3*  HCT 39.4 38.7  MCV 73.4* 75.0*  PLT 324 326   Cardiac Enzymes: Recent Labs  Lab 04/11/18 2243  TROPONINI <0.03   BNP: Invalid input(s): POCBNP CBG: Recent Labs  Lab 04/11/18 1717  GLUCAP 192*   HbA1C: No results for input(s): HGBA1C in the last 72 hours. Urine analysis:    Component Value Date/Time   COLORURINE YELLOW 04/11/2018 0001   APPEARANCEUR CLOUDY (A) 04/11/2018 0001   LABSPEC 1.025 04/11/2018 0001   PHURINE 5.0 04/11/2018 0001   GLUCOSEU NEGATIVE 04/11/2018 0001   HGBUR MODERATE (A) 04/11/2018 0001   BILIRUBINUR NEGATIVE 04/11/2018 0001   KETONESUR NEGATIVE 04/11/2018 0001   PROTEINUR 100 (A) 04/11/2018 0001   UROBILINOGEN 0.2 03/14/2015 1026   NITRITE POSITIVE (A) 04/11/2018 0001   LEUKOCYTESUR LARGE (A) 04/11/2018 0001   Sepsis Labs: @LABRCNTIP (procalcitonin:4,lacticidven:4) )No results found for this or any previous visit (from the past 240 hour(s)).   Scheduled Meds: . acyclovir  400 mg Oral Daily  . budesonide (PULMICORT) nebulizer solution  0.5 mg Nebulization BID  . fentaNYL (SUBLIMAZE) injection  100 mcg Intravenous Once   . insulin aspart  0-9 Units Subcutaneous TID WC  . ipratropium-albuterol  3 mL Nebulization Q6H  . levothyroxine  137 mcg Oral QAC breakfast  . levothyroxine  137 mcg Oral QAC breakfast  . pantoprazole  40 mg Oral BID  . pantoprazole  40 mg Oral Daily  . sucralfate  1 g Oral TID WC & HS   Continuous Infusions: . 0.9 % NaCl with KCl 20 mEq / L 100 mL/hr at 04/12/18 0403    Procedures/Studies: Dg Chest 2 View  Result Date: 04/11/2018 CLINICAL DATA:  Lower abdominal pain with nausea, vomiting and diarrhea x4 days. EXAM: CHEST - 2 VIEW COMPARISON:  None. FINDINGS: Stable moderate cardiomegaly with mild interstitial edema. No effusion or pneumothorax. No acute osseous abnormality. IMPRESSION: Stable cardiomegaly.  No acute pulmonary disease. Electronically Signed   By: Ashley Royalty M.D.   On: 04/11/2018 19:39   Ct Angio Chest Pe W/cm &/or Wo Cm  Result Date: 04/12/2018 CLINICAL DATA:  Dyspnea with exertion. EXAM: CT ANGIOGRAPHY CHEST WITH CONTRAST TECHNIQUE: Multidetector  CT imaging of the chest was performed using the standard protocol during bolus administration of intravenous contrast. Multiplanar CT image reconstructions and MIPs were obtained to evaluate the vascular anatomy. CONTRAST:  185mL ISOVUE-370 IOPAMIDOL (ISOVUE-370) INJECTION 76% COMPARISON:  Plain radiographs dating back through 08/09/2010 FINDINGS: Cardiovascular: Heart size is top-normal. Moderate to large pericardial effusion is identified measuring up to 3.8 cm posterolaterally on the left and 2.5 cm along the right heart border. Opacification of the pulmonary arteries to the distal lobar level is noted. Beyond this, there is suboptimal opacification of the pulmonary arteries. No large central pulmonary embolus is noted. Mediastinum/Nodes: Nonaneurysmal atherosclerotic thoracic aorta. Motion related artifacts limit assessment along the proximal ascending portion. No dissection is apparent. Trachea and mainstem bronchi are patent.  Esophagus is unremarkable. Conventional branch pattern of the great vessels with atherosclerosis at the origins. Lungs/Pleura: Passive atelectasis bilaterally with subsegmental atelectasis in the left upper lobe and lingula. No dominant mass or pneumothorax. Subsegmental atelectasis and/or scarring is noted medially within the left lower lobe. Tiny subpleural nodular densities seen in the right upper lobe measuring 3 mm, series 7/59. Upper Abdomen: Hepatic steatosis.  Otherwise negative. Musculoskeletal: Lower cervical spondylosis. Osteopenic appearance of the bones without acute fracture. Review of the MIP images confirms the above findings. IMPRESSION: 1. No large central pulmonary embolus to the distal lobar levels. 2. Borderline cardiomegaly with moderate to large pericardial effusion as above described measuring up to 3.8 cm posterolaterally on the left and 2.5 cm along the right heart border. 3. No active cardiopulmonary disease. 4. Hepatic steatosis. Aortic Atherosclerosis (ICD10-I70.0). Electronically Signed   By: Ashley Royalty M.D.   On: 04/12/2018 00:06    Orson Eva, DO  [facility] Pager [phone]  If 7PM-7AM, please contact night-coverage [email or web address] Password [email or web address] 04/12/2018, 8:08 AM   LOS: 0 days

## 2018-04-12 NOTE — Progress Notes (Addendum)
Pharmacy Antibiotic Note  Lisa Ewing is a 62 y.o. female admitted on 04/11/2018 with UTI/concer for pyelonephritis.  Pharmacy has been consulted for Levaquin dosing.  Plan: Levaquin 500 mg IV daily Monitor labs, c/s, and patient improvement  Height: 5\' 5"  (165.1 cm) Weight: 297 lb 9.9 oz (135 kg) IBW/kg (Calculated) : 57  Temp (24hrs), Avg:98.2 F (36.8 C), Min:98 F (36.7 C), Max:98.3 F (36.8 C)  Recent Labs  Lab 04/11/18 1742 04/12/18 0500  WBC 13.1* 12.8*  CREATININE 1.18* 0.89    Estimated Creatinine Clearance: 91.3 mL/min (by C-G formula based on SCr of 0.89 mg/dL).    Allergies  Allergen Reactions  . Bee Venom Anaphylaxis  . Latex Shortness Of Breath  . Oxycodone-Acetaminophen Hives and Shortness Of Breath    Upset Stomach  . Penicillins Anaphylaxis    Throat swells Has patient had a PCN reaction causing immediate rash, facial/tongue/throat swelling, SOB or lightheadedness with hypotension: yes Has patient had a PCN reaction causing severe rash involving mucus membranes or skin necrosis: no Has patient had a PCN reaction that required hospitalization- yes at hospital Has patient had a PCN reaction occurring within the last 10 years: no If all of the above answers are "NO", then may proceed with Cephalosporin use.   . Rocephin [Ceftriaxone Sodium In Dextrose] Swelling    Tingling around lips  . Shellfish Allergy Anaphylaxis    Throat swells   . Codeine Nausea Only  . Metoclopramide Hcl     Doesn't recall type of reaction  . Pregabalin Swelling  . Sulfonamide Derivatives     Tongue swells   . Adhesive [Tape] Rash  . Other Swelling, Itching and Rash    Almonds. Bananas cause an asthma attack.   . Wellbutrin [Bupropion] Rash    Hives, red whelps    Antimicrobials this admission: Levquin 8/5 >>    Dose adjustments this admission: N/A  Microbiology results: 8/5 UCx: pending    Thank you for allowing pharmacy to be a part of this patient's  care.  Margot Ables, PharmD Clinical Pharmacist 04/12/2018 12:17 PM

## 2018-04-13 ENCOUNTER — Inpatient Hospital Stay (HOSPITAL_COMMUNITY): Payer: Medicaid Other

## 2018-04-13 ENCOUNTER — Ambulatory Visit: Payer: Medicaid Other | Admitting: Physical Medicine & Rehabilitation

## 2018-04-13 ENCOUNTER — Encounter (HOSPITAL_COMMUNITY): Payer: Self-pay | Admitting: Gastroenterology

## 2018-04-13 ENCOUNTER — Encounter (HOSPITAL_COMMUNITY)
Admission: EM | Disposition: A | Discharge status: Home / Self Care | Payer: Self-pay | Source: Home / Self Care | Attending: Internal Medicine

## 2018-04-13 DIAGNOSIS — Z91040 Latex allergy status: Secondary | ICD-10-CM | POA: Diagnosis not present

## 2018-04-13 DIAGNOSIS — J9602 Acute respiratory failure with hypercapnia: Secondary | ICD-10-CM

## 2018-04-13 DIAGNOSIS — Z882 Allergy status to sulfonamides status: Secondary | ICD-10-CM | POA: Diagnosis not present

## 2018-04-13 DIAGNOSIS — J9601 Acute respiratory failure with hypoxia: Secondary | ICD-10-CM | POA: Diagnosis not present

## 2018-04-13 DIAGNOSIS — G894 Chronic pain syndrome: Secondary | ICD-10-CM | POA: Diagnosis present

## 2018-04-13 DIAGNOSIS — Z993 Dependence on wheelchair: Secondary | ICD-10-CM | POA: Diagnosis not present

## 2018-04-13 DIAGNOSIS — I10 Essential (primary) hypertension: Secondary | ICD-10-CM | POA: Diagnosis not present

## 2018-04-13 DIAGNOSIS — I313 Pericardial effusion (noninflammatory): Principal | ICD-10-CM

## 2018-04-13 DIAGNOSIS — A020 Salmonella enteritis: Secondary | ICD-10-CM | POA: Diagnosis present

## 2018-04-13 DIAGNOSIS — F329 Major depressive disorder, single episode, unspecified: Secondary | ICD-10-CM | POA: Diagnosis present

## 2018-04-13 DIAGNOSIS — R197 Diarrhea, unspecified: Secondary | ICD-10-CM

## 2018-04-13 DIAGNOSIS — J441 Chronic obstructive pulmonary disease with (acute) exacerbation: Secondary | ICD-10-CM | POA: Diagnosis present

## 2018-04-13 DIAGNOSIS — R112 Nausea with vomiting, unspecified: Secondary | ICD-10-CM

## 2018-04-13 DIAGNOSIS — Z91030 Bee allergy status: Secondary | ICD-10-CM | POA: Diagnosis not present

## 2018-04-13 DIAGNOSIS — J9622 Acute and chronic respiratory failure with hypercapnia: Secondary | ICD-10-CM | POA: Diagnosis present

## 2018-04-13 DIAGNOSIS — I314 Cardiac tamponade: Secondary | ICD-10-CM

## 2018-04-13 DIAGNOSIS — K219 Gastro-esophageal reflux disease without esophagitis: Secondary | ICD-10-CM | POA: Diagnosis present

## 2018-04-13 DIAGNOSIS — F17210 Nicotine dependence, cigarettes, uncomplicated: Secondary | ICD-10-CM | POA: Diagnosis present

## 2018-04-13 DIAGNOSIS — Z880 Allergy status to penicillin: Secondary | ICD-10-CM | POA: Diagnosis not present

## 2018-04-13 DIAGNOSIS — Z9981 Dependence on supplemental oxygen: Secondary | ICD-10-CM | POA: Diagnosis not present

## 2018-04-13 DIAGNOSIS — E860 Dehydration: Secondary | ICD-10-CM | POA: Diagnosis present

## 2018-04-13 DIAGNOSIS — N390 Urinary tract infection, site not specified: Secondary | ICD-10-CM | POA: Diagnosis present

## 2018-04-13 DIAGNOSIS — E662 Morbid (severe) obesity with alveolar hypoventilation: Secondary | ICD-10-CM | POA: Diagnosis present

## 2018-04-13 DIAGNOSIS — E785 Hyperlipidemia, unspecified: Secondary | ICD-10-CM | POA: Diagnosis present

## 2018-04-13 DIAGNOSIS — G9341 Metabolic encephalopathy: Secondary | ICD-10-CM

## 2018-04-13 DIAGNOSIS — R0603 Acute respiratory distress: Secondary | ICD-10-CM | POA: Diagnosis not present

## 2018-04-13 DIAGNOSIS — Z91013 Allergy to seafood: Secondary | ICD-10-CM | POA: Diagnosis not present

## 2018-04-13 DIAGNOSIS — Z6841 Body Mass Index (BMI) 40.0 and over, adult: Secondary | ICD-10-CM | POA: Diagnosis not present

## 2018-04-13 DIAGNOSIS — J9621 Acute and chronic respiratory failure with hypoxia: Secondary | ICD-10-CM | POA: Diagnosis present

## 2018-04-13 DIAGNOSIS — E1140 Type 2 diabetes mellitus with diabetic neuropathy, unspecified: Secondary | ICD-10-CM | POA: Diagnosis present

## 2018-04-13 HISTORY — PX: PERICARDIOCENTESIS: CATH118255

## 2018-04-13 HISTORY — DX: Metabolic encephalopathy: G93.41

## 2018-04-13 LAB — ECHOCARDIOGRAM LIMITED
Height: 65 in
WEIGHTICAEL: 4761.94 [oz_av]

## 2018-04-13 LAB — BODY FLUID CELL COUNT WITH DIFFERENTIAL: WBC FLUID: 9 uL (ref 0–1000)

## 2018-04-13 LAB — BASIC METABOLIC PANEL
ANION GAP: 6 (ref 5–15)
BUN: 10 mg/dL (ref 8–23)
CALCIUM: 7.7 mg/dL — AB (ref 8.9–10.3)
CHLORIDE: 103 mmol/L (ref 98–111)
CO2: 29 mmol/L (ref 22–32)
Creatinine, Ser: 0.76 mg/dL (ref 0.44–1.00)
GFR calc Af Amer: >60 mL/min (ref >60)
GFR calc non Af Amer: >60 mL/min (ref >60)
GLUCOSE: 163 mg/dL — AB (ref 70–99)
Potassium: 4.4 mmol/L (ref 3.5–5.1)
Sodium: 138 mmol/L (ref 135–145)

## 2018-04-13 LAB — GASTROINTESTINAL PANEL BY PCR, STOOL (REPLACES STOOL CULTURE)

## 2018-04-13 LAB — CBC
HCT: 39.6 % (ref 36.0–46.0)
HEMATOCRIT: 39.8 % (ref 36.0–46.0)
HEMOGLOBIN: 11.2 g/dL — AB (ref 12.0–15.0)
Hemoglobin: 10.7 g/dL — ABNORMAL LOW (ref 12.0–15.0)
MCH: 21.6 pg — AB (ref 26.0–34.0)
MCH: 21 pg — AB (ref 26.0–34.0)
MCHC: 28.3 g/dL — AB (ref 30.0–36.0)
MCHC: 26.9 g/dL — AB (ref 30.0–36.0)
MCV: 76.3 fL — AB (ref 78.0–100.0)
MCV: 78 fL (ref 78.0–100.0)
Platelets: 307 10*3/uL (ref 150–400)
Platelets: 324 10*3/uL (ref 150–400)
RBC: 5.19 MIL/uL — AB (ref 3.87–5.11)
RBC: 5.1 MIL/uL (ref 3.87–5.11)
RDW: 20.7 % — ABNORMAL HIGH (ref 11.5–15.5)
RDW: 21 % — AB (ref 11.5–15.5)
WBC: 12.2 10*3/uL — ABNORMAL HIGH (ref 4.0–10.5)
WBC: 11 10*3/uL — ABNORMAL HIGH (ref 4.0–10.5)

## 2018-04-13 LAB — GLUCOSE, CAPILLARY
GLUCOSE-CAPILLARY: 144 mg/dL — AB (ref 70–99)
GLUCOSE-CAPILLARY: 151 mg/dL — AB (ref 70–99)
Glucose-Capillary: 187 mg/dL — ABNORMAL HIGH (ref 70–99)
Glucose-Capillary: 172 mg/dL — ABNORMAL HIGH (ref 70–99)
Glucose-Capillary: 161 mg/dL — ABNORMAL HIGH (ref 70–99)
Glucose-Capillary: 177 mg/dL — ABNORMAL HIGH (ref 70–99)

## 2018-04-13 LAB — POCT I-STAT 3, ART BLOOD GAS (G3+)
ACID-BASE EXCESS: 4 mmol/L — AB (ref 0.0–2.0)
Acid-Base Excess: 3 mmol/L — ABNORMAL HIGH (ref 0.0–2.0)
BICARBONATE: 31.9 mmol/L — AB (ref 20.0–28.0)
Bicarbonate: 28.9 mmol/L — ABNORMAL HIGH (ref 20.0–28.0)
O2 Saturation: 92 %
O2 Saturation: 93 %
PCO2 ART: 45.6 mmHg (ref 32.0–48.0)
PH ART: 7.411 (ref 7.350–7.450)
PO2 ART: 75 mmHg — AB (ref 83.0–108.0)
PO2 ART: 67 mmHg — AB (ref 83.0–108.0)
Patient temperature: 98.7
TCO2: 34 mmol/L — AB (ref 22–32)
TCO2: 30 mmol/L (ref 22–32)
pCO2 arterial: 66 mmHg (ref 32.0–48.0)
pH, Arterial: 7.292 — ABNORMAL LOW (ref 7.350–7.450)

## 2018-04-13 LAB — SEDIMENTATION RATE: Sed Rate: 20 mm/hr (ref 0–22)

## 2018-04-13 LAB — GRAM STAIN: Gram Stain: NONE SEEN

## 2018-04-13 LAB — CREATININE, SERUM
Creatinine, Ser: 0.82 mg/dL (ref 0.44–1.00)
GFR calc Af Amer: >60 mL/min (ref >60)

## 2018-04-13 LAB — MRSA PCR SCREENING
MRSA BY PCR: NEGATIVE
MRSA by PCR: NEGATIVE

## 2018-04-13 LAB — TSH: TSH: 0.788 u[IU]/mL (ref 0.350–4.500)

## 2018-04-13 LAB — T4, FREE: Free T4: 1.03 ng/dL (ref 0.82–1.77)

## 2018-04-13 SURGERY — PERICARDIOCENTESIS
Anesthesia: LOCAL

## 2018-04-13 MED ORDER — ALBUTEROL SULFATE (2.5 MG/3ML) 0.083% IN NEBU: 2.5000 mg | INHALATION_SOLUTION | RESPIRATORY_TRACT | Status: DC | PRN

## 2018-04-13 MED ORDER — SODIUM CHLORIDE 0.9 % IV SOLN: 250.0000 mL | INTRAVENOUS | Status: DC | PRN

## 2018-04-13 MED ORDER — ACETAMINOPHEN 325 MG PO TABS
650.0000 mg | ORAL_TABLET | ORAL | Status: DC | PRN
  Administered 2018-04-16: 650 mg via ORAL
  Filled 2018-04-13: qty 2

## 2018-04-13 MED ORDER — LEVOTHYROXINE SODIUM 100 MCG IV SOLR
68.0000 ug | Freq: Every day | INTRAVENOUS | Status: DC
  Administered 2018-04-14 – 2018-04-15 (×2): 68 ug via INTRAVENOUS
  Filled 2018-04-13 (×3): qty 5

## 2018-04-13 MED ORDER — SODIUM CHLORIDE 0.9% FLUSH: 3.0000 mL | INTRAVENOUS | Status: DC | PRN

## 2018-04-13 MED ORDER — HEPARIN SODIUM (PORCINE) 5000 UNIT/ML IJ SOLN
5000.0000 [IU] | Freq: Three times a day (TID) | INTRAMUSCULAR | Status: DC
  Administered 2018-04-13 – 2018-04-19 (×17): 5000 [IU] via SUBCUTANEOUS
  Filled 2018-04-13 (×17): qty 1

## 2018-04-13 MED ORDER — SODIUM CHLORIDE 0.9% FLUSH
3.0000 mL | Freq: Two times a day (BID) | INTRAVENOUS | Status: DC
  Administered 2018-04-13 – 2018-04-17 (×8): 3 mL via INTRAVENOUS
  Administered 2018-04-18: 0.3 mL via INTRAVENOUS

## 2018-04-13 MED ORDER — HEPARIN (PORCINE) IN NACL 1000-0.9 UT/500ML-% IV SOLN
INTRAVENOUS | Status: DC | PRN
  Administered 2018-04-13: 500 mL

## 2018-04-13 MED ORDER — ESCITALOPRAM OXALATE 10 MG PO TABS
20.0000 mg | ORAL_TABLET | Freq: Every day | ORAL | Status: DC
  Administered 2018-04-14 – 2018-04-19 (×6): 20 mg via ORAL
  Filled 2018-04-13 (×6): qty 2

## 2018-04-13 MED ORDER — FENTANYL CITRATE (PF) 100 MCG/2ML IJ SOLN
INTRAMUSCULAR | Status: AC
  Filled 2018-04-13: qty 2

## 2018-04-13 MED ORDER — METHYLPREDNISOLONE SODIUM SUCC 125 MG IJ SOLR
60.0000 mg | Freq: Four times a day (QID) | INTRAMUSCULAR | Status: DC
  Administered 2018-04-13 – 2018-04-19 (×25): 60 mg via INTRAVENOUS
  Filled 2018-04-13 (×25): qty 2

## 2018-04-13 MED ORDER — PIOGLITAZONE HCL 45 MG PO TABS: 45.0000 mg | ORAL_TABLET | Freq: Every day | ORAL | Status: DC

## 2018-04-13 MED ORDER — LIDOCAINE HCL (PF) 1 % IJ SOLN
INTRAMUSCULAR | Status: AC
  Filled 2018-04-13: qty 30

## 2018-04-13 MED ORDER — CALCITRIOL 0.25 MCG PO CAPS
0.2500 ug | ORAL_CAPSULE | Freq: Every day | ORAL | Status: DC
  Administered 2018-04-14 – 2018-04-19 (×6): 0.25 ug via ORAL
  Filled 2018-04-13 (×6): qty 1

## 2018-04-13 MED ORDER — NALOXONE HCL 0.4 MG/ML IJ SOLN
INTRAMUSCULAR | Status: AC
  Administered 2018-04-13: 0.8 mg
  Filled 2018-04-13: qty 2

## 2018-04-13 MED ORDER — HYDROMORPHONE HCL 1 MG/ML IJ SOLN: 1.0000 mg | INTRAMUSCULAR | Status: DC | PRN

## 2018-04-13 MED ORDER — EZETIMIBE 10 MG PO TABS
10.0000 mg | ORAL_TABLET | Freq: Every day | ORAL | Status: DC
  Administered 2018-04-14 – 2018-04-19 (×6): 10 mg via ORAL
  Filled 2018-04-13 (×6): qty 1

## 2018-04-13 MED ORDER — SODIUM CHLORIDE 0.9% FLUSH
3.0000 mL | Freq: Two times a day (BID) | INTRAVENOUS | Status: DC
  Administered 2018-04-13 – 2018-04-17 (×7): 3 mL via INTRAVENOUS

## 2018-04-13 MED ORDER — MONTELUKAST SODIUM 10 MG PO TABS
10.0000 mg | ORAL_TABLET | Freq: Every day | ORAL | Status: DC
  Administered 2018-04-14 – 2018-04-18 (×5): 10 mg via ORAL
  Filled 2018-04-13 (×5): qty 1

## 2018-04-13 MED ORDER — FENOFIBRATE 54 MG PO TABS
54.0000 mg | ORAL_TABLET | Freq: Every day | ORAL | Status: DC
  Administered 2018-04-14 – 2018-04-19 (×6): 54 mg via ORAL
  Filled 2018-04-13 (×7): qty 1

## 2018-04-13 MED ORDER — LINAGLIPTIN 5 MG PO TABS
5.0000 mg | ORAL_TABLET | Freq: Every day | ORAL | Status: DC
  Administered 2018-04-14 – 2018-04-19 (×6): 5 mg via ORAL
  Filled 2018-04-13 (×6): qty 1

## 2018-04-13 MED ORDER — CHLORHEXIDINE GLUCONATE 0.12 % MT SOLN
15.0000 mL | Freq: Two times a day (BID) | OROMUCOSAL | Status: DC
  Administered 2018-04-13 – 2018-04-18 (×11): 15 mL via OROMUCOSAL
  Filled 2018-04-13 (×9): qty 15

## 2018-04-13 MED ORDER — SODIUM CHLORIDE 0.9 % IV SOLN
INTRAVENOUS | Status: DC
  Administered 2018-04-13 – 2018-04-14 (×2): via INTRAVENOUS
  Administered 2018-04-15: 250 mL via INTRAVENOUS

## 2018-04-13 MED ORDER — MOMETASONE FURO-FORMOTEROL FUM 200-5 MCG/ACT IN AERO
2.0000 | INHALATION_SPRAY | Freq: Two times a day (BID) | RESPIRATORY_TRACT | Status: DC
  Administered 2018-04-14 – 2018-04-17 (×8): 2 via RESPIRATORY_TRACT
  Filled 2018-04-13: qty 8.8

## 2018-04-13 MED ORDER — GABAPENTIN 300 MG PO CAPS
300.0000 mg | ORAL_CAPSULE | ORAL | Status: DC
  Administered 2018-04-14 – 2018-04-19 (×6): 300 mg via ORAL
  Filled 2018-04-13 (×6): qty 1

## 2018-04-13 MED ORDER — DIPHENHYDRAMINE HCL 25 MG PO CAPS: 50.0000 mg | ORAL_CAPSULE | Freq: Once | ORAL | Status: DC

## 2018-04-13 MED ORDER — LIDOCAINE HCL (PF) 1 % IJ SOLN
INTRAMUSCULAR | Status: DC | PRN
  Administered 2018-04-13: 15 mL

## 2018-04-13 MED ORDER — ATORVASTATIN CALCIUM 40 MG PO TABS
40.0000 mg | ORAL_TABLET | Freq: Every day | ORAL | Status: DC
  Administered 2018-04-14 – 2018-04-18 (×5): 40 mg via ORAL
  Filled 2018-04-13 (×5): qty 1

## 2018-04-13 MED ORDER — ORAL CARE MOUTH RINSE
15.0000 mL | Freq: Two times a day (BID) | OROMUCOSAL | Status: DC
  Administered 2018-04-14 – 2018-04-18 (×7): 15 mL via OROMUCOSAL

## 2018-04-13 MED ORDER — FUROSEMIDE 10 MG/ML IJ SOLN
40.0000 mg | Freq: Once | INTRAMUSCULAR | Status: AC
  Administered 2018-04-13: 40 mg via INTRAVENOUS
  Filled 2018-04-13: qty 4

## 2018-04-13 MED ORDER — SODIUM CHLORIDE 0.9 % IV SOLN
INTRAVENOUS | Status: AC
  Administered 2018-04-13: 18:00:00 via INTRAVENOUS

## 2018-04-13 MED ORDER — FENTANYL CITRATE (PF) 100 MCG/2ML IJ SOLN
INTRAMUSCULAR | Status: DC | PRN
  Administered 2018-04-13 (×2): 25 ug via INTRAVENOUS

## 2018-04-13 MED ORDER — NALOXONE HCL 0.4 MG/ML IJ SOLN: 0.8000 mg | Freq: Once | INTRAMUSCULAR | Status: DC

## 2018-04-13 MED ORDER — LORATADINE 10 MG PO TABS
10.0000 mg | ORAL_TABLET | Freq: Every day | ORAL | Status: DC
  Administered 2018-04-14 – 2018-04-19 (×6): 10 mg via ORAL
  Filled 2018-04-13 (×6): qty 1

## 2018-04-13 MED ORDER — ONDANSETRON HCL 4 MG/2ML IJ SOLN: 4.0000 mg | Freq: Four times a day (QID) | INTRAMUSCULAR | Status: DC | PRN

## 2018-04-13 MED ORDER — GABAPENTIN 300 MG PO CAPS
600.0000 mg | ORAL_CAPSULE | Freq: Two times a day (BID) | ORAL | Status: DC
  Administered 2018-04-14 – 2018-04-19 (×10): 600 mg via ORAL
  Filled 2018-04-13 (×10): qty 2

## 2018-04-13 MED ORDER — HEPARIN (PORCINE) IN NACL 1000-0.9 UT/500ML-% IV SOLN
INTRAVENOUS | Status: AC
  Filled 2018-04-13: qty 500

## 2018-04-13 MED ORDER — METFORMIN HCL 500 MG PO TABS
1000.0000 mg | ORAL_TABLET | Freq: Two times a day (BID) | ORAL | Status: DC
  Administered 2018-04-14 – 2018-04-19 (×11): 1000 mg via ORAL
  Filled 2018-04-13 (×11): qty 2

## 2018-04-13 SURGICAL SUPPLY — 2 items
HOVERMATT SINGLE USE (MISCELLANEOUS) ×2 IMPLANT
PERIVAC PERICARDIOCENTESIS 8.3 (TRAY / TRAY PROCEDURE) ×2 IMPLANT

## 2018-04-13 NOTE — Progress Notes (Signed)
PROGRESS NOTE  Lisa Ewing ALP:[id number] DOB: 10-Jul-1956 DOA: 04/11/2018 PCP: Orlena Sheldon, PA-C   Brief History:  62 year old female with a history of diabetes mellitus, COPD, hyperlipidemia, OSA, depression, chronic back pain, hypothyroidism, fibromyalgia presenting with 4-day history of nausea, vomiting, and diarrhea.  Patient feels this may be related to eating some shredded chicken approximately 3 hours after which she began having the above symptoms.  The patient noted some bloody streaks in her stool after multiple episodes of diarrhea.  Patient also had complained of abdominal cramping and pain with dysuria.  She denies any fevers, chills, chest pain, shortness breath, cough, hemoptysis patient denies any sick contacts.  None of her other family members ate the shredded chicken.  She denies any rashes.  She denies any recent antibiotics.  Unfortunately, the patient continues to smoke 1/2 pack/day.  She has an over 50-pack-year history.  Upon presentation, the patient was noted to have sodium 134 with potassium 3.5.  WBC was 13.1.  CT angiogram of the chest showed a moderate to large pericardial effusion but no PE.  On the morning of 04/13/2018, the patient was noted to be confused and hypoxic/hypercarbic. She was placed on 6L initially and subsequently moved to SDU.    Assessment/Plan: Intractable vomiting and diarrhea -due to colitis and UTI? -04/12/2018 CT abdomen pelvis--circumferential mural edema and thickening with pericolonic edema of the ascending, transverse, descending colon -vomiting improved -only 1 loose BM in last 24 hours -started cipro/flagyl -Clear liquid diet for now -Stool pathogen panel--pending -Urine culture -GI consult  Acute metabolic encephalopathy -Multifactorial including infectious process, respiratory failure, and opioids -Obtain ABG--7.23/71/75/24 on 6L -CBG 172 -Continue empiric antibiotics -d/c norco  Acute respiratory failure with  hypoxia -Secondary to COPD exacerbation in the setting of OHS/OSA -8/6--personally reviewed chest x-ray--increase interstitial markings, no consolidation -ABG--7.23/71/75/24 on 6L -Place on BiPAP -Wean oxygen as tolerated for saturation greater 92%  COPD Exacerbation -wheezing on examination on 8/6 -Continue duo nebs -Continue Pulmicort -start IV steroidslasix IV x 1  Pyuria/UTI -Obtain urine culture -continue cipro -Continue PPI  Pericardial effusion -This has been chronic dating back to November 2018 -Most recent echocardiogram 03/09/18 did not show tamponade physiology -Repeat limited echocardiogram -Monitor clinically  Diabetes mellitus type 2 -NovoLog sliding scale -Holding metformin, Januvia and Actos  Tobacco abuse  -I have discussed tobacco cessation with the patient.  I have counseled the patient regarding the negative impacts of continued tobacco use including but not limited to lung cancer, COPD, and cardiovascular disease.  I have discussed alternatives to tobacco and modalities that may help facilitate tobacco cessation including but not limited to biofeedback, hypnosis, and medications.  Total time spent with tobacco counseling was 4 minutes.  Chronic pain syndrome -Continue home dose hydrocodone  Hypothyroidism -Continue Synthroid  Hyperlipidemia -Restart TriCor, Niaspan, Zetia, Lipitor once able to tolerate po reliably  Morbid Obesity -BMI 49.5 -lifestyle modification    Disposition Plan:   Transfer to SDU Family Communication:  No Family at bedside   Consultants:  none  Code Status:  FULL   DVT Prophylaxis:  SCDs  Procedures: As Listed in Progress Note Above  Antibiotics: Ciprofloxacin 8/5>>> Metronidazole 8/5>>>  The patient is critically ill with multiple organ systems failure and requires high complexity decision making for assessment and support, frequent evaluation and titration of therapies, application of advanced  monitoring technologies and extensive interpretation of multiple databases.  Critical care time - 40 mins.  Subjective: The patient is confused this morning but able to answer simple yes/no questions.  She complains of some shortness of breath.  She complains of abdominal pain.  She denies any chest pain, vomiting headache.  Remainder review of systems unobtainable secondary to patient's encephalopathy  Objective: Vitals:   04/13/18 0525 04/13/18 0758 04/13/18 0803 04/13/18 0813  BP: (!) 158/74   (!) 156/77  Pulse: (!) 102   92  Resp:      Temp: 98.6 F (37 C)     TempSrc: Oral     SpO2: 92% (!) 78% 90% 97%  Weight:      Height:        Intake/Output Summary (Last 24 hours) at 04/13/2018 0853 Last data filed at 04/13/2018 0700 Gross per 24 hour  Intake 1801.67 ml  Output 300 ml  Net 1501.67 ml   Weight change:  Exam:   General:  Pt is alert, follows commands appropriately, not in acute distress  HEENT: No icterus, No thrush, No neck mass, [place]/AT  Cardiovascular: RRR, S1/S2, no rubs, no gallops  Respiratory: Bilateral scattered rales.  Bilateral scattered wheezing.  Abdomen: Soft/+BS, non tender, non distended, no guarding  Extremities: trace LE edema, No lymphangitis, No petechiae, No rashes, no synovitis   Data Reviewed: I have personally reviewed following labs and imaging studies Basic Metabolic Panel: Recent Labs  Lab 04/11/18 1742 04/12/18 0500 04/13/18 0541  NA 134* 138 138  K 3.5 4.0 4.4  CL 96* 101 103  CO2 28 29 29   GLUCOSE 167* 148* 163*  BUN 13 12 10   CREATININE 1.18* 0.89 0.76  CALCIUM 8.1* 7.8* 7.7*  MG  --  1.6*  --   PHOS  --  4.1  --    Liver Function Tests: Recent Labs  Lab 04/11/18 1742  AST 20  ALT 13  ALKPHOS 63  BILITOT 0.6  PROT 7.2  ALBUMIN 3.3*   Recent Labs  Lab 04/11/18 1742  LIPASE 23   No results for input(s): AMMONIA in the last 168 hours. Coagulation Profile: No results for input(s): INR, PROTIME in the last  168 hours. CBC: Recent Labs  Lab 04/11/18 1742 04/12/18 0500 04/13/18 0541  WBC 13.1* 12.8* 12.2*  NEUTROABS  --  10.2*  --   HGB 11.5* 11.3* 11.2*  HCT 39.4 38.7 39.6  MCV 73.4* 75.0* 76.3*  PLT 324 326 307   Cardiac Enzymes: Recent Labs  Lab 04/11/18 2243  TROPONINI <0.03   BNP: Invalid input(s): POCBNP CBG: Recent Labs  Lab 04/12/18 1206 04/12/18 1624 04/12/18 2108 04/13/18 0741 04/13/18 0847  GLUCAP 129* 156* 150* 187* 172*   HbA1C: No results for input(s): HGBA1C in the last 72 hours. Urine analysis:    Component Value Date/Time   COLORURINE AMBER (A) 04/12/2018 1235   APPEARANCEUR CLOUDY (A) 04/12/2018 1235   LABSPEC >1.046 (H) 04/12/2018 1235   PHURINE 5.0 04/12/2018 1235   GLUCOSEU NEGATIVE 04/12/2018 1235   HGBUR MODERATE (A) 04/12/2018 1235   BILIRUBINUR NEGATIVE 04/12/2018 1235   KETONESUR NEGATIVE 04/12/2018 1235   PROTEINUR 100 (A) 04/12/2018 1235   UROBILINOGEN 0.2 03/14/2015 1026   NITRITE POSITIVE (A) 04/12/2018 1235   LEUKOCYTESUR LARGE (A) 04/12/2018 1235   Sepsis Labs: @LABRCNTIP (procalcitonin:4,lacticidven:4) )No results found for this or any previous visit (from the past 240 hour(s)).   Scheduled Meds: . acyclovir  400 mg Oral Daily  . budesonide (PULMICORT) nebulizer solution  0.5 mg Nebulization BID  . insulin aspart  0-9 Units  Subcutaneous TID WC  . ipratropium-albuterol  3 mL Nebulization Q6H  . levothyroxine  137 mcg Oral QAC breakfast  . methylPREDNISolone (SOLU-MEDROL) injection  60 mg Intravenous Q6H  . pantoprazole  40 mg Oral BID  . sucralfate  1 g Oral TID WC & HS   Continuous Infusions: . ciprofloxacin Stopped (04/13/18 0700)  . metronidazole 500 mg (04/13/18 0828)    Procedures/Studies: Ct Abdomen Pelvis Wo Contrast  Result Date: 04/12/2018 CLINICAL DATA:  Generalized abdominal pain for 4 days. EXAM: CT ABDOMEN AND PELVIS WITHOUT CONTRAST TECHNIQUE: Multidetector CT imaging of the abdomen and pelvis was performed  following the standard protocol without IV contrast. COMPARISON:  07/01/2017 FINDINGS: Lower chest: Pericardial effusion noted measuring up to 3.7 cm in thickness adjacent to the left ventricle. There is bilateral lower lobe collapse/consolidation. Hepatobiliary: The liver shows diffusely decreased attenuation suggesting steatosis. Layering gallbladder sludge. No intrahepatic or extrahepatic biliary dilation. Pancreas: No focal mass lesion. No dilatation of the main duct. No intraparenchymal cyst. No peripancreatic edema. Spleen: No splenomegaly. No focal mass lesion. Adrenals/Urinary Tract: No adrenal nodule or mass. Contrast excretion in the kidneys is compatible with contrast infused CT scan yesterday. Kidneys otherwise unremarkable. No evidence for hydroureter. The urinary bladder appears normal for the degree of distention. Stomach/Bowel: Stomach is nondistended. No gastric wall thickening. No evidence of outlet obstruction. Duodenum is normally positioned as is the ligament of Treitz. No small bowel wall thickening. No small bowel dilatation. The terminal ileum is normal. Diffuse circumferential wall thickening with mural and pericolonic edema noted in the right and transverse colon extending distally to the sigmoid segment. Sigmoid colon and rectum are relatively normal in CT appearance. Vascular/Lymphatic: There is abdominal aortic atherosclerosis without aneurysm. There is no gastrohepatic or hepatoduodenal ligament lymphadenopathy. No intraperitoneal or retroperitoneal lymphadenopathy. Clustered normal size lymph nodes are seen in the ileocolic mesentery. No pelvic sidewall lymphadenopathy. Reproductive: Uterus surgically absent.  There is no adnexal mass. Other: No intraperitoneal free fluid. Musculoskeletal: Small umbilical hernia contains only fat. Status post right total hip replacement. No worrisome lytic or sclerotic osseous abnormality. Multiple Schmorl's nodes are evident in the thoracolumbar  spine. IMPRESSION: 1. Circumferential mural edema and thickening with pericolonic edema noted in the ascending, transverse, and descending colon. Imaging features compatible with infectious/inflammatory colitis. 2. Mild to moderate pericardial effusion appears similar to yesterday's chest CT. 3.  Aortic Atherosclerois (ICD10-170.0) Electronically Signed   By: Misty Stanley M.D.   On: 04/12/2018 17:25   Dg Chest 2 View  Result Date: 04/11/2018 CLINICAL DATA:  Lower abdominal pain with nausea, vomiting and diarrhea x4 days. EXAM: CHEST - 2 VIEW COMPARISON:  None. FINDINGS: Stable moderate cardiomegaly with mild interstitial edema. No effusion or pneumothorax. No acute osseous abnormality. IMPRESSION: Stable cardiomegaly.  No acute pulmonary disease. Electronically Signed   By: Ashley Royalty M.D.   On: 04/11/2018 19:39   Ct Angio Chest Pe W/cm &/or Wo Cm  Result Date: 04/12/2018 CLINICAL DATA:  Dyspnea with exertion. EXAM: CT ANGIOGRAPHY CHEST WITH CONTRAST TECHNIQUE: Multidetector CT imaging of the chest was performed using the standard protocol during bolus administration of intravenous contrast. Multiplanar CT image reconstructions and MIPs were obtained to evaluate the vascular anatomy. CONTRAST:  156mL ISOVUE-370 IOPAMIDOL (ISOVUE-370) INJECTION 76% COMPARISON:  Plain radiographs dating back through 08/09/2010 FINDINGS: Cardiovascular: Heart size is top-normal. Moderate to large pericardial effusion is identified measuring up to 3.8 cm posterolaterally on the left and 2.5 cm along the right heart border. Opacification of the  pulmonary arteries to the distal lobar level is noted. Beyond this, there is suboptimal opacification of the pulmonary arteries. No large central pulmonary embolus is noted. Mediastinum/Nodes: Nonaneurysmal atherosclerotic thoracic aorta. Motion related artifacts limit assessment along the proximal ascending portion. No dissection is apparent. Trachea and mainstem bronchi are patent.  Esophagus is unremarkable. Conventional branch pattern of the great vessels with atherosclerosis at the origins. Lungs/Pleura: Passive atelectasis bilaterally with subsegmental atelectasis in the left upper lobe and lingula. No dominant mass or pneumothorax. Subsegmental atelectasis and/or scarring is noted medially within the left lower lobe. Tiny subpleural nodular densities seen in the right upper lobe measuring 3 mm, series 7/59. Upper Abdomen: Hepatic steatosis.  Otherwise negative. Musculoskeletal: Lower cervical spondylosis. Osteopenic appearance of the bones without acute fracture. Review of the MIP images confirms the above findings. IMPRESSION: 1. No large central pulmonary embolus to the distal lobar levels. 2. Borderline cardiomegaly with moderate to large pericardial effusion as above described measuring up to 3.8 cm posterolaterally on the left and 2.5 cm along the right heart border. 3. No active cardiopulmonary disease. 4. Hepatic steatosis. Aortic Atherosclerosis (ICD10-I70.0). Electronically Signed   By: Ashley Royalty M.D.   On: 04/12/2018 00:06    Orson Eva, DO  [facility] Pager [phone]  If 7PM-7AM, please contact night-coverage [email or web address] Password [email or web address] 04/13/2018, 8:53 AM   LOS: 0 days

## 2018-04-13 NOTE — Progress Notes (Signed)
Patient brought down from 300 on bipap. Resting quietly at this time. VSS

## 2018-04-13 NOTE — Progress Notes (Signed)
RT note-Bipap changes per MD post ABG, ABG ordered in one hour.

## 2018-04-13 NOTE — Progress Notes (Addendum)
Followed up on pt.  Stable on BiPAP.  Mental status much improved and now back to baseline from mental status standpoint.  She denies, cp, sob, n/v.  abd pain overall a little better than 2 days ago.  No diarrhea.     T98.6-HR 78-RR-16-20--146/79--99% on Bipap   Vitals:   04/13/18 0943 04/13/18 1205 04/13/18 1213 04/13/18 1230  BP:    (!) 149/81  Pulse: 89   87  Resp: 15 16  14   Temp:      TempSrc:      SpO2: 93% 97% 96% 97%  Weight:      Height:       CV--RRR Lung-bibasilar rales, mild bibasilar wheeze Abd--soft+BS/mild diffuse tender, ND, no peritoneal signs   Assessment/Plan: Pericardial tamponade -discussed with Dr. 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  Patient and daughter updated.  They agree for transfer to [place].      Orson Eva, DO [facility]
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
## 2018-04-13 NOTE — Progress Notes (Signed)
RT note-Patient received and placed on Bipap from [facility] in cath lab. Patient is now back in ICU and remains on Bipap with ABG pending.
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
## 2018-04-13 NOTE — Consult Note (Addendum)
Name: Lisa Ewing MRN: [id number] DOB: 11-04-1955    ADMISSION DATE:  04/11/2018 CONSULTATION DATE:  04/13/2017  REFERRING MD :  Dr. Irish Lack  CHIEF COMPLAINT:  Respiratory distress/ AMS  HISTORY OF PRESENT ILLNESS:   HPI obtained from medical chart review as patient is on BiPAP and confused.   62 year old female with PMH significant for but not limited to chronic pericardial effusion, ongoing tobacco abuse, COPD, DM type 2, HLD and chronic pain who was originally admitted at [facility] with abdominal pain, imaging consistent with colitis.  She is additionally been treated for UTI and COPD exacerbation.  She developed worsening respiratory distress on 8/6 requiring BiPAP with limited Echo which showed large pericardial effusion with dilated IVC, 25% MV respiratory variation, and RA collapse without RV compromise, otherwise has been hemodynamically stable.   Cardiology was consulted and transferred to [facility] where she underwent a pericardiocentesis with 550 ml of serosanguinous fluid removed and drain left in place.  She receieved total of fentanyl 50 mcg during procedure and returned to ICU post-op where she remained somulent.  ABG showing 7.292/66/75/31.9 on Bipap 12/6 and 40%.  PCCM consulted for further management.   PAST MEDICAL HISTORY :  She  has a past medical history of Abnormal involuntary movements(781.0), Allergic rhinitis, Asthma, Back fracture, Barrett esophagus, Broken hip ([place]), Bursitis of left hip, Cervicalgia, Chronic pain, COPD (chronic obstructive pulmonary disease) ([place]), Depression, Diabetes mellitus without complication ([place]), Disorder of sacroiliac joint, GERD (gastroesophageal reflux disease), History of uterine cancer (1998), cervical cancer, Hyperlipidemia, Hyperplastic colon polyp, Hypothyroid (07/11/2013), Lumbago, Osteoporosis, Pain in joint, pelvic region and thigh, Pain in joint, upper arm, Sciatica, Shingles, Short-term memory loss, Sleep apnea, Sleep apnea, Smoker, TMJ  (dislocation of temporomandibular joint), Trochanteric bursitis, UTI (lower urinary tract infection), and Vitamin D deficiency.  PAST SURGICAL HISTORY: She  has a past surgical history that includes S/P Hysterectomy (1999); Thyroidectomy (2007); Rotator cuff repair (Right, 2006); thumb surgery (2010); Esophagogastroduodenoscopy (05/02/2010); Colonoscopy (05/02/2010); Abdominal hysterectomy; Hip surgery (Right); Back surgery (1995); Diagnostic laparoscopy; Colonoscopy with propofol (N/A, 02/12/2015); Esophagogastroduodenoscopy (egd) with propofol (N/A, 02/12/2015); Esophageal dilation (N/A, 02/12/2015); biopsy (N/A, 02/12/2015); polypectomy (N/A, 02/12/2015); Carpal bone excision (Right, 03/01/2015); Carpometacarpel [facility]) suspension plasty (Right, 03/01/2015); and Tendon transfer (Right, 03/01/2015).  Allergies  Allergen Reactions  . Bee Venom Anaphylaxis  . Latex Shortness Of Breath  . Oxycodone-Acetaminophen Hives and Shortness Of Breath    Upset Stomach  . Penicillins Anaphylaxis    Throat swells Has patient had a PCN reaction causing immediate rash, facial/tongue/throat swelling, SOB or lightheadedness with hypotension: yes Has patient had a PCN reaction causing severe rash involving mucus membranes or skin necrosis: no Has patient had a PCN reaction that required hospitalization- yes at hospital Has patient had a PCN reaction occurring within the last 10 years: no If all of the above answers are "NO", then may proceed with Cephalosporin use.   . Rocephin [Ceftriaxone Sodium In Dextrose] Swelling    Tingling around lips  . Shellfish Allergy Anaphylaxis    Throat swells   . Codeine Nausea Only  . Metoclopramide Hcl     Doesn't recall type of reaction  . Pregabalin Swelling  . Sulfonamide Derivatives     Tongue swells   . Adhesive [Tape] Rash  . Other Swelling, Itching and Rash    Almonds. Bananas cause an asthma attack.   . Wellbutrin [Bupropion] Rash    Hives, red whelps    No current  facility-administered medications on  file prior to encounter.    Current Outpatient Medications on File Prior to Encounter  Medication Sig  . acyclovir (ZOVIRAX) 400 MG tablet TAKE ONE TABLET BY MOUTH ONCE DAILY.  Marland Kitchen ADVAIR DISKUS 250-50 MCG/DOSE AEPB INHALE 1 PUFF TWICE DAILY; RINSE AFTER USE.  Marland Kitchen albuterol (PROVENTIL HFA) 108 (90 Base) MCG/ACT inhaler USE 2 PUFFS EVERY 4 TO 6 HOURS AS NEEDED FOR SHORTNESS OF BREATH  . atorvastatin (LIPITOR) 40 MG tablet TAKE 1 TABLET BY MOUTH ONCE DAILY AT 6:00 PM.  . calcitRIOL (ROCALTROL) 0.25 MCG capsule TAKE ONE CAPSULE BY MOUTH ONCE DAILY (Patient taking differently: TAKE 0.67mg CAPSULE BY MOUTH ONCE DAILY)  . cetirizine (ZYRTEC) 10 MG tablet TAKE ONE TABLET BY MOUTH ONCE DAILY.  .Marland KitchenEPINEPHrine 0.3 mg/0.3 mL IJ SOAJ injection INJECT 1 PEN INTRAMUSCULARLY AS NEEDED FOR ALLERGIC REACTION  . escitalopram (LEXAPRO) 20 MG tablet TAKE 1 TABLET EVERY DAY.  .Marland Kitchenezetimibe (ZETIA) 10 MG tablet TAKE ONE TABLET BY MOUTH ONCE DAILY.  . fenofibrate (TRICOR) 145 MG tablet Take 1 tablet (145 mg total) by mouth daily.  . furosemide (LASIX) 20 MG tablet TAKE ONE TABLET BY MOUTH ONCE DAILY.  .Marland Kitchengabapentin (NEURONTIN) 300 MG capsule Take two capsule in the morning, one capsule in the afternoon  and 2 capsules at bedtime (Patient taking differently: Take 300-600 mg by mouth See admin instructions. Take two capsule in the morning, one capsule in the afternoon  and 2 capsules at bedtime)  . HYDROcodone-acetaminophen (NORCO) 10-325 MG tablet Take 1 tablet by mouth 3 (three) times daily.  . hyoscyamine (LEVSIN SL) 0.125 MG SL tablet PLACE 1 TABLET UNDER THE TONGUE EVERY 4 HOURS AS NEEDED (Patient taking differently: PLACE 1 TABLET UNDER THE TONGUE EVERY 4 HOURS AS NEEDED for cramping)  . ipratropium-albuterol (DUONEB) 0.5-2.5 (3) MG/3ML SOLN INHALE THE CONTENTS OF ONE VIAL VIA NEBULIZER BY MOUTH FOUR TIMES A DAY AS NEEDED FOR WHEEZING OR SHORTNESS OF BREATH  . JANUVIA 100 MG tablet TAKE  ONE TABLET BY MOUTH ONCE DAILY.  .Marland Kitchenlevothyroxine (SYNTHROID, LEVOTHROID) 137 MCG tablet TAKE 1 TABLET BEFORE BREAKFAST.  . metFORMIN (GLUCOPHAGE) 1000 MG tablet TAKE 1 TABLET BY MOUTH TWICE DAILY WITH MEALS.  . methocarbamol (ROBAXIN) 500 MG tablet TAKE (1) TABLET BY MOUTH TWICE A DAY AS NEEDED. (Patient taking differently: Take 500 mg by mouth 2 (two) times daily as needed for muscle spasms. )  . montelukast (SINGULAIR) 10 MG tablet TAKE (1) TABLET BY MOUTH AT BEDTIME.  .Marland KitchenNIASPAN 1000 MG CR tablet TAKE (2) TABLETS BY MOUTH AT BEDTIME.  .Marland Kitchennystatin cream (MYCOSTATIN) APPLY 1 APPLICATION TO AFFECTED AREA(S) TWICE DAILY as needed for irritations  . pantoprazole (PROTONIX) 40 MG tablet TAKE ONE TABLET BY MOUTH 2 TIMES A DAY BEFORE A MEAL.  . pioglitazone (ACTOS) 45 MG tablet TAKE ONE TABLET BY MOUTH ONCE DAILY.  . raloxifene (EVISTA) 60 MG tablet Take 1 tablet (60 mg total) by mouth daily.  . sucralfate (CARAFATE) 1 g tablet TAKE 1g TABLET BY MOUTH 4 TIMES A DAY WITH MEALS AND AT BEDTIME as needed for stomach coating  . traZODone (DESYREL) 100 MG tablet Take 1 tablet (100 mg total) by mouth at bedtime.  . triamcinolone (KENALOG) 0.025 % cream APPLY 1 APPLICATION TOPICALLY TO AFFECTED AREA(S) TWICE DAILY as needed  . VESICARE 5 MG tablet TAKE ONE TABLET BY MOUTH ONCE DAILY.  . VOLTAREN 1 % GEL APPLY 2 GRAMS TO AFFECTED AREA FOUR TIMES A DAY.  .Marland KitchenACCU-CHEK FASTCLIX  LANCETS MISC 1 each by Other route daily.  Marland Kitchen doxycycline (VIBRAMYCIN) 100 MG capsule Take 1 capsule (100 mg total) by mouth 2 (two) times daily. (Patient not taking: Reported on 04/11/2018)  . glucose blood (ACCU-CHEK AVIVA PLUS) test strip USE TO TEST BLOOD SUGAR ONCE DAILY  . levofloxacin (LEVAQUIN) 500 MG tablet Take 1 tablet (500 mg total) by mouth daily. (Patient not taking: Reported on 04/11/2018)  . nitrofurantoin, macrocrystal-monohydrate, (MACROBID) 100 MG capsule Take 1 capsule (100 mg total) by mouth 2 (two) times daily. (Patient not  taking: Reported on 04/11/2018)  . [DISCONTINUED] solifenacin (VESICARE) 5 MG tablet Take 10 mg by mouth daily.    FAMILY HISTORY:  Her family history includes Breast cancer in her maternal aunt; Diabetes in her father; Hyperlipidemia in her mother; Throat cancer in her maternal grandmother. There is no history of Colon cancer.  SOCIAL HISTORY: She  reports that she has been smoking cigarettes.  She has a 42.00 pack-year smoking history. She has never used smokeless tobacco. She reports that she drank alcohol. She reports that she does not use drugs.  REVIEW OF SYSTEMS:   Unable to obtain as patient is confused on BiPAP  SUBJECTIVE:   VITAL SIGNS: BP 136/78   Pulse 93   Temp 98.2 F (36.8 C) (Axillary)   Resp 17   Ht '5\' 5"'$  (1.651 m)   Wt 297 lb 9.9 oz (135 kg)   LMP  (LMP Unknown)   SpO2 97%   BMI 49.53 kg/m   HEMODYNAMICS:    VENTILATOR SETTINGS: Vent Mode: BIPAP FiO2 (%):  [40 %-100 %] 40 % Set Rate:  [14 bmp] 14 bmp PEEP:  [6 cmH20] 6 cmH20  INTAKE / OUTPUT: I/O last 3 completed shifts: In: 2801.7 [P.O.:240; I.V.:1095; IV Piggyback:1466.7] Out: 300 [Urine:300]  PHYSICAL EXAMINATION: General:  Chronically ill appearing female on BiPAP in NAD HEENT: well fitting face mask Neuro: arouses to verbal, follows simple commands, MAE CV:  rrr, +2 pulses, pericardial drain present PULM: even/non-labored on BiPAP, lungs bilaterally coarse, faint exp wheeze, faint L basilar crackles GI: obese, soft, mild tenderness, bs active  Extremities: warm/dry, no edema  Skin: no rashes  LABS:  BMET Recent Labs  Lab 04/11/18 1742 04/12/18 0500 04/13/18 0541  NA 134* 138 138  K 3.5 4.0 4.4  CL 96* 101 103  CO2 '28 29 29  '$ BUN '13 12 10  '$ CREATININE 1.18* 0.89 0.76  GLUCOSE 167* 148* 163*    Electrolytes Recent Labs  Lab 04/11/18 1742 04/12/18 0500 04/13/18 0541  CALCIUM 8.1* 7.8* 7.7*  MG  --  1.6*  --   PHOS  --  4.1  --     CBC Recent Labs  Lab 04/11/18 1742  04/12/18 0500 04/13/18 0541  WBC 13.1* 12.8* 12.2*  HGB 11.5* 11.3* 11.2*  HCT 39.4 38.7 39.6  PLT 324 326 307    Coag's No results for input(s): APTT, INR in the last 168 hours.  Sepsis Markers No results for input(s): LATICACIDVEN, PROCALCITON, O2SATVEN in the last 168 hours.  ABG Recent Labs  Lab 04/13/18 0900 04/13/18 1804  PHART 7.233* 7.292*  PCO2ART 71.1* 66.0*  PO2ART 75.9* 75.0*    Liver Enzymes Recent Labs  Lab 04/11/18 1742  AST 20  ALT 13  ALKPHOS 63  BILITOT 0.6  ALBUMIN 3.3*    Cardiac Enzymes Recent Labs  Lab 04/11/18 2243  TROPONINI <0.03    Glucose Recent Labs  Lab 04/12/18 1624 04/12/18 2108 04/13/18 0741  04/13/18 0847 04/13/18 1109 04/13/18 1559  GLUCAP 156* 150* 187* 172* 161* 144*    STUDIES:  8/5 CT abd/ pelvis >> 1. Circumferential mural edema and thickening with pericolonic edema noted in the ascending, transverse, and descending colon. Imaging features compatible with infectious/inflammatory colitis.  2. Mild to moderate pericardial effusion appears similar to yesterday's chest CT. 3.  Aortic Atherosclerois    8/6 CXR >>  Increased soft tissue density in the right hilar and right paratracheal region. This may reflect collapse of portions of the right middle and lower lobes. Lymphadenopathy is felt less likely. There is stable enlargement of the cardiac silhouette with mild pulmonary vascular congestion consistent with CHF. Chest CT scanning is recommended if the patient can tolerate the procedure.  CULTURES: 8/5 UC >>  Cdiff >>> (not sent as of 8/6 secondary to no stool) 8/5 GI PCR >> + salmonella species  8/6 pericardial fluid cx >> 8/6 pericardial GS >> 8/6 MRSA PCR >> 8/6 BC >>  ANTIBIOTICS: 8/4 Levaquin x 1 8/5 acyclovir x 1 8/5 flagyl >>8/6 8/6 cipro >>  SIGNIFICANT EVENTS: 8/4 Admit to [facility]  8/6 tx to [facility]  LINES/TUBES: PIV x 2 8/6 Pericardial drain >>   ASSESSMENT / PLAN:  Acute COPD  exacerbation Acute hypoxic/ hypercarbic respiratory insufficiency Tobacco abuse  -  ABG now is actually better than ABG earlier on HFNC of 7.233/71.1/75.9/24.7 P:  Narcan now BiPAP adjusted per Dr. 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 - Cdiff not sent as no stools to send P:  Continue cipro, change to PO when able D/c flagyl Send blood cultures  UTI P:  Cipro as above Follow UC  Leukocytosis- minimal  - likely related to above, afebrile  - currently hemodynamically stable P: - send for blood cultures - continue cipro as above, change to PO when able - Trend WBC/ fever curve   GERD P:  Protonix BID   Depression Chronic pain P:  Continue lexapro and Neurontin  Hold sedating meds with tenuous resp status   DM P:  CBG q 4  SSI   Hypothyroidism P:  Synthroid daily   DVT prophylaxis: heparin SQ / SCDs SUP: PPI Diet: NPO Activity: bedrest  Disposition : ICU  CCT 40 mins  Kennieth Rad, AGACNP-BC [facility] Pgr: [phone] or if no answer [phone] 04/13/2018, 6:49 PM    Updates:  No family at bedside.

## 2018-04-13 NOTE — Interval H&P Note (Signed)
History and Physical Interval Note:  04/13/2018 4:20 PM  Lisa Ewing  has presented today for surgery, with the diagnosis of Pericardial Effusion  possible Cardiac Tamponade  The various methods of treatment have been discussed with the patient and family. After consideration of risks, benefits and other options for treatment, the patient has consented to  Procedure(s): PERICARDIOCENTESIS (N/A) as a surgical intervention .  The patient's history has been reviewed, patient examined, no change in status, stable for surgery.  I have reviewed the patient's chart and labs.  Questions were answered to the patient's satisfaction.     Larae Grooms

## 2018-04-13 NOTE — Progress Notes (Signed)
At 0800 this am patient had an O2 sat of 78% on [place], resp in room patient patient given breathing treatment and put up to 10HFNC. MD informed and orders given. Patient was put on BIPAP and is awaiting bed in ICU.  1154 patient transferred to ICU.
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
## 2018-04-13 NOTE — Progress Notes (Signed)
Pt given all PRN pain meds. PT moans and groans continuously. PT rates pain 10/10. PT refuses to turn or reposition. PT transferred with 2 people to [facility], gait extremely unsteady. Continue to monitor.

## 2018-04-13 NOTE — Progress Notes (Signed)
*  PRELIMINARY RESULTS* Echocardiogram 2D Echocardiogram has been performed.  Lisa Ewing 04/13/2018, 10:48 AM

## 2018-04-13 NOTE — Progress Notes (Addendum)
    Full consult to follow. Echo reviewed just now and shows large pericardial effusion. She has evidence of RA collapse, dilated IVC, and approx 25% respiratory variation across the MV. No clear RV collapse. Several month history of pericardial effusion (noted in 07/2017) without clear etiology. TSH, ANA, ESR, BUN/Cr have been unremarkable. No active malignancy by imaging.  Fairly stable large effusion over serial imaging without definitive tamponade physiology, she prior had no signs of clinical tamponade. Heart rates and bp's stable at this time, significant SOB this AM, being treated for COPD exacerbation, likely her effusion is playing some role in her SOB. Complicated current admission with colitis, UTI, COPD exacerbation. Probable pending tamponade based on echo findings, currently hemodynamics stable but on bipap for her SOB. Will discuss with [facility] interventional attending for possible pericardiocentesis. Case discussed with Dr Tat who will help arrange transfer to [facility] to stepdown medicine service.    Carlyle Dolly MD   For questions or updates, please contact [facility] Please consult [email or web address] for contact info under Cardiology/STEMI.   Merrily Pew, MD  04/13/2018 12:52 PM

## 2018-04-14 ENCOUNTER — Encounter (HOSPITAL_COMMUNITY): Payer: Self-pay | Admitting: Interventional Cardiology

## 2018-04-14 DIAGNOSIS — R0603 Acute respiratory distress: Secondary | ICD-10-CM

## 2018-04-14 LAB — COMPREHENSIVE METABOLIC PANEL
ALBUMIN: 2.6 g/dL — AB (ref 3.5–5.0)
ALT: 19 U/L (ref 0–44)
AST: 18 U/L (ref 15–41)
Alkaline Phosphatase: 69 U/L (ref 38–126)
Anion gap: 10 (ref 5–15)
BUN: 10 mg/dL (ref 8–23)
CHLORIDE: 98 mmol/L (ref 98–111)
CO2: 31 mmol/L (ref 22–32)
CREATININE: 0.76 mg/dL (ref 0.44–1.00)
Calcium: 8 mg/dL — ABNORMAL LOW (ref 8.9–10.3)
GFR calc Af Amer: >60 mL/min (ref >60)
GLUCOSE: 151 mg/dL — AB (ref 70–99)
POTASSIUM: 3.7 mmol/L (ref 3.5–5.1)
SODIUM: 139 mmol/L (ref 135–145)
Total Bilirubin: 0.7 mg/dL (ref 0.3–1.2)
Total Protein: 6.1 g/dL — ABNORMAL LOW (ref 6.5–8.1)

## 2018-04-14 LAB — PROTEIN, BODY FLUID (OTHER): Total Protein, Body Fluid Other: 5.1 g/dL

## 2018-04-14 LAB — CBC
HEMATOCRIT: 38.2 % (ref 36.0–46.0)
Hemoglobin: 10.6 g/dL — ABNORMAL LOW (ref 12.0–15.0)
MCH: 20.8 pg — ABNORMAL LOW (ref 26.0–34.0)
MCHC: 27.7 g/dL — AB (ref 30.0–36.0)
MCV: 75 fL — ABNORMAL LOW (ref 78.0–100.0)
Platelets: 320 10*3/uL (ref 150–400)
RBC: 5.09 MIL/uL (ref 3.87–5.11)
RDW: 20.8 % — ABNORMAL HIGH (ref 11.5–15.5)
WBC: 15.5 10*3/uL — AB (ref 4.0–10.5)

## 2018-04-14 LAB — GLUCOSE, BODY FLUID OTHER: GLUCOSE, BODY FLUID OTHER: 159 mg/dL

## 2018-04-14 LAB — GLUCOSE, CAPILLARY
GLUCOSE-CAPILLARY: 215 mg/dL — AB (ref 70–99)
GLUCOSE-CAPILLARY: 166 mg/dL — AB (ref 70–99)
Glucose-Capillary: 215 mg/dL — ABNORMAL HIGH (ref 70–99)

## 2018-04-14 LAB — LD, BODY FLUID (OTHER): LD, Body Fluid: 86 IU/L

## 2018-04-14 LAB — C DIFFICILE QUICK SCREEN W PCR REFLEX
C Diff antigen: NEGATIVE
C Diff interpretation: NOT DETECTED
C Diff toxin: NEGATIVE

## 2018-04-14 LAB — PH, BODY FLUID: PH, BODY FLUID: 7.8

## 2018-04-14 MED ORDER — SODIUM CHLORIDE 0.9% FLUSH
10.0000 mL | Freq: Two times a day (BID) | INTRAVENOUS | Status: DC
  Administered 2018-04-14 – 2018-04-19 (×10): 10 mL

## 2018-04-14 MED ORDER — COLCHICINE 0.6 MG PO TABS
0.6000 mg | ORAL_TABLET | Freq: Two times a day (BID) | ORAL | Status: DC
  Administered 2018-04-14 – 2018-04-19 (×11): 0.6 mg via ORAL
  Filled 2018-04-14 (×11): qty 1

## 2018-04-14 MED ORDER — HYDROCODONE-ACETAMINOPHEN 5-325 MG PO TABS
1.0000 | ORAL_TABLET | ORAL | Status: DC | PRN
  Administered 2018-04-14 – 2018-04-18 (×20): 1 via ORAL
  Filled 2018-04-14 (×20): qty 1

## 2018-04-14 MED ORDER — SODIUM CHLORIDE 0.9% FLUSH: 10.0000 mL | INTRAVENOUS | Status: DC | PRN

## 2018-04-14 NOTE — Progress Notes (Signed)
PROGRESS NOTE    Lisa Ewing  SVX:[id number] DOB: [birth year] DOA: 04/11/2018 PCP: Orlena Sheldon, PA-C  Outpatient Specialists:     Brief Narrative: Lisa Ewing is a 62 year old female, morbidly obese, with past medical history significant for  recurrent pericardial effusion, abnormal involuntary movements, allergic rhinitis, asthma, COPD, tobacco abuse, Barrett's esophagus, left hip bursitis, chronic pain syndrome, depression, type 2 diabetes, GERD, history of uterine cancer, hyperlipidemia, hypothyroidism, OSA noncompliant with CPAP, likely undiagnosed OHS, chronic back pain, osteoporosis, sciatica, shingles, sleep apnea, vitamin D deficiency.  Lisa Ewing was admitted with symptomatic large pericardial effusion.  Lisa Ewing has undergone pericardiocentesis.  The cardiology team is managing.  Lisa Ewing also has COPD with exacerbation, acute on chronic combined respiratory failure, possible UTI, and diarrhea secondary to Salmonella.  Diarrhea has resolved.  Lisa Ewing is still on ciprofloxacin.  Pericardial fluid drain still in situ.  Respiratory symptoms are improving.  Assessment & Plan:   Principal Problem:   Pericardial effusion Active Problems:   GERD   Diarrhea   COPD (chronic obstructive pulmonary disease) (HCC)   Depression   Hyperlipidemia   Hypothyroid   Smoker   Diabetes mellitus type 2, uncomplicated (HCC)   Sleep apnea   Acute lower UTI   Abdominal pain   Acute gastroenteritis   Intractable vomiting with nausea   Obesity, Class III, BMI 40-49.9 (morbid obesity) (HCC)   Dehydration   Acute respiratory failure with hypoxia and hypercapnia (HCC)   Acute metabolic encephalopathy   Nausea vomiting and diarrhea   Pericardial tamponade   Pericardial effusion, recurrent: Lisa Ewing has undergone pericardial centesis. Lisa Ewing has pericardial drain in situ Cardiology is managing.  Possible acute lower UTI Urine cultures growing Salmonella  Stool culture is growing Salmonella  It  is possible that the specimen is contaminated.   Lisa Ewing is on Cipro.   Diarrhea secondary to Salmonella: Diarrhea has resolved. Complete course of antibiotics.  COPD with exacerbation/tobacco abuse/respiratory failure:  Continue current management. Lisa Ewing is improving.  GERD Protonix 40 mg p.o. twice daily.  Depression Resume citalopram once tolerating oral intake.  Hyperlipidemia Resume meds once clinically improved.  Hypothyroid Continue levothyroxine 137 mcg p.o. daily.   Diabetes mellitus type 2, uncomplicated ([place]) Resume oral hypoglycemics once cleared for oral intake.  Sleep apnea Not compliant with CPAP.   Morbid obesity.  DVT prophylaxis: Lovenox SQ. Code Status: Full code. Family Communication:  Disposition Plan:  Observation for IV hydration and abdominal pain treatment. Consults called:    Procedures:   Pericardiocentesis.  Antimicrobials:   Cipro IV   Subjective: Lisa Ewing is not back to baseline. Shortness of breath is improving.  Objective: Vitals:   04/14/18 1600 04/14/18 1700 04/14/18 1800 04/14/18 1900  BP: (!) 108/55 92/63 121/67 132/67  Pulse: 83 (!) 102 (!) 103 95  Resp: 18 18 19 12   Temp: 98.1 F (36.7 C)     TempSrc: Oral     SpO2: 95% 91% (!) 88% 99%  Weight:      Height:        Intake/Output Summary (Last 24 hours) at 04/14/2018 1910 Last data filed at 04/14/2018 1600 Gross per 24 hour  Intake 408.23 ml  Output 910 ml  Net -501.77 ml   Filed Weights   04/11/18 1712 04/12/18 0326 04/14/18 0500  Weight: 136.1 kg (300 lb) 135 kg (297 lb 9.9 oz) 132.6 kg (292 lb 5.3 oz)    Examination:  General exam: Morbidly obese.  Appears calm and comfortable  Respiratory system: Decreased air  entry.   Cardiovascular system: S1 & S2 heard Gastrointestinal system: Abdomen is obese, soft and nontender.  Organs are difficult to assess.   Central nervous system: Alert and oriented. No focal neurological  deficits. Extremities: No leg edema.    Data Reviewed: I have personally reviewed following labs and imaging studies  CBC: Recent Labs  Lab 04/11/18 1742 04/12/18 0500 04/13/18 0541 04/13/18 2003 04/14/18 0233  WBC 13.1* 12.8* 12.2* 11.0* 15.5*  NEUTROABS  --  10.2*  --   --   --   HGB 11.5* 11.3* 11.2* 10.7* 10.6*  HCT 39.4 38.7 39.6 39.8 38.2  MCV 73.4* 75.0* 76.3* 78.0 75.0*  PLT 324 326 307 324 536   Basic Metabolic Panel: Recent Labs  Lab 04/11/18 1742 04/12/18 0500 04/13/18 0541 04/13/18 2003 04/14/18 0233  NA 134* 138 138  --  139  K 3.5 4.0 4.4  --  3.7  CL 96* 101 103  --  98  CO2 28 29 29   --  31  GLUCOSE 167* 148* 163*  --  151*  BUN 13 12 10   --  10  CREATININE 1.18* 0.89 0.76 0.82 0.76  CALCIUM 8.1* 7.8* 7.7*  --  8.0*  MG  --  1.6*  --   --   --   PHOS  --  4.1  --   --   --    GFR: Estimated Creatinine Clearance: 100.4 mL/min (by C-G formula based on SCr of 0.76 mg/dL). Liver Function Tests: Recent Labs  Lab 04/11/18 1742 04/14/18 0233  AST 20 18  ALT 13 19  ALKPHOS 63 69  BILITOT 0.6 0.7  PROT 7.2 6.1*  ALBUMIN 3.3* 2.6*   Recent Labs  Lab 04/11/18 1742  LIPASE 23   No results for input(s): AMMONIA in the last 168 hours. Coagulation Profile: No results for input(s): INR, PROTIME in the last 168 hours. Cardiac Enzymes: Recent Labs  Lab 04/11/18 2243  TROPONINI <0.03   BNP (last 3 results) No results for input(s): PROBNP in the last 8760 hours. HbA1C: No results for input(s): HGBA1C in the last 72 hours. CBG: Recent Labs  Lab 04/13/18 1559 04/13/18 1834 04/13/18 2159 04/14/18 0612 04/14/18 1156  GLUCAP 144* 177* 151* 215* 215*   Lipid Profile: No results for input(s): CHOL, HDL, LDLCALC, TRIG, CHOLHDL, LDLDIRECT in the last 72 hours. Thyroid Function Tests: Recent Labs    04/13/18 2003  TSH 0.788  FREET4 1.03   Anemia Panel: No results for input(s): VITAMINB12, FOLATE, FERRITIN, TIBC, IRON, RETICCTPCT in the last  72 hours. Urine analysis:    Component Value Date/Time   COLORURINE AMBER (A) 04/12/2018 1235   APPEARANCEUR CLOUDY (A) 04/12/2018 1235   LABSPEC >1.046 (H) 04/12/2018 1235   PHURINE 5.0 04/12/2018 1235   GLUCOSEU NEGATIVE 04/12/2018 1235   HGBUR MODERATE (A) 04/12/2018 1235   BILIRUBINUR NEGATIVE 04/12/2018 1235   KETONESUR NEGATIVE 04/12/2018 1235   PROTEINUR 100 (A) 04/12/2018 1235   UROBILINOGEN 0.2 03/14/2015 1026   NITRITE POSITIVE (A) 04/12/2018 1235   LEUKOCYTESUR LARGE (A) 04/12/2018 1235   Sepsis Labs: @LABRCNTIP (procalcitonin:4,lacticidven:4)  ) Recent Results (from the past 240 hour(s))  Culture, Urine     Status: Abnormal (Preliminary result)   Collection Time: 04/12/18 12:35 PM  Result Value Ref Range Status   Specimen Description   Final    URINE, RANDOM Performed at [facility], [address] [place], [place] [address]    Special Requests   Final  NONE Performed at [facility], [address], [place], [place] [address]    Culture (A)  Final    20,000 COLONIES/mL [place] NOTIFIED Referred to [facility] in [place], [place] for Serotyping. Performed at [facility], [place] [address], [place], [place] [address]    Report Status PENDING  Incomplete   Organism ID, Bacteria SALMONELLA SPECIES (A)  Final      Susceptibility   Salmonella species - MIC*    AMPICILLIN <=2 SENSITIVE Sensitive     LEVOFLOXACIN <=0.12 SENSITIVE Sensitive     TRIMETH/SULFA <=20 SENSITIVE Sensitive     * 20,000 COLONIES/mL SALMONELLA SPECIES  Gastrointestinal Panel by PCR , Stool     Status: Abnormal   Collection Time: 04/12/18  9:00 PM  Result Value Ref Range Status   Campylobacter species NOT DETECTED NOT DETECTED Final   Plesimonas shigelloides NOT DETECTED NOT DETECTED Final   Salmonella species DETECTED (A) NOT DETECTED Final    Comment: RESULT CALLED TO, READ BACK BY AND VERIFIED WITH: CHELSEA MOTLEY 04/13/18  1634 KLW    Yersinia enterocolitica NOT DETECTED NOT DETECTED Final   Vibrio species NOT DETECTED NOT DETECTED Final   Vibrio cholerae NOT DETECTED NOT DETECTED Final   Enteroaggregative E coli (EAEC) NOT DETECTED NOT DETECTED Final   Enteropathogenic E coli (EPEC) NOT DETECTED NOT DETECTED Final   Enterotoxigenic E coli (ETEC) NOT DETECTED NOT DETECTED Final   Shiga like toxin producing E coli (STEC) NOT DETECTED NOT DETECTED Final   Shigella/Enteroinvasive E coli (EIEC) NOT DETECTED NOT DETECTED Final   Cryptosporidium NOT DETECTED NOT DETECTED Final   Cyclospora cayetanensis NOT DETECTED NOT DETECTED Final   Entamoeba histolytica NOT DETECTED NOT DETECTED Final   Giardia lamblia NOT DETECTED NOT DETECTED Final   Adenovirus F40/41 NOT DETECTED NOT DETECTED Final   Astrovirus NOT DETECTED NOT DETECTED Final   Norovirus GI/GII NOT DETECTED NOT DETECTED Final   Rotavirus A NOT DETECTED NOT DETECTED Final   Sapovirus (I, II, IV, and V) NOT DETECTED NOT DETECTED Final    Comment: Performed at [facility], [place] [place], [place] [address]  MRSA PCR Screening     Status: None   Collection Time: 04/13/18  1:51 PM  Result Value Ref Range Status   MRSA by PCR NEGATIVE NEGATIVE Final    Comment:        The GeneXpert MRSA Assay (FDA approved for NASAL specimens only), is one component of a comprehensive MRSA colonization surveillance program. It is not intended to diagnose MRSA infection nor to guide or monitor treatment for MRSA infections. Performed at [facility], [address] [place], [place] [address]   Culture, body fluid-bottle     Status: None (Preliminary result)   Collection Time: 04/13/18  4:50 PM  Result Value Ref Range Status   Specimen Description FLUID PERITONEAL [facility]  Final   Special Requests NONE  Final   Culture   Final    NO GROWTH < 24 HOURS Performed at [facility], [place] [address] [place], [place] [address]    Report Status  PENDING  Incomplete  Gram stain     Status: None   Collection Time: 04/13/18  4:50 PM  Result Value Ref Range Status   Specimen Description FLUID PERITONEAL  Final   Special Requests NONE  Final   Gram Stain   Final    NO WBC SEEN NO ORGANISMS SEEN Performed at  [facility], [place] [address] [place], [place] [address]    Report Status 04/13/2018 FINAL  Final  Culture, blood (routine x 2)     Status: None (Preliminary result)   Collection Time: 04/13/18  6:56 PM  Result Value Ref Range Status   Specimen Description BLOOD RIGHT ARM  Final   Special Requests   Final    BOTTLES DRAWN AEROBIC AND ANAEROBIC Blood Culture adequate volume   Culture   Final    NO GROWTH < 24 HOURS Performed at [facility], [place] [address], [place], [place] [address]    Report Status PENDING  Incomplete  Culture, blood (routine x 2)     Status: None (Preliminary result)   Collection Time: 04/13/18  6:56 PM  Result Value Ref Range Status   Specimen Description BLOOD RIGHT ANTECUBITAL  Final   Special Requests   Final    BOTTLES DRAWN AEROBIC AND ANAEROBIC Blood Culture adequate volume   Culture   Final    NO GROWTH < 24 HOURS Performed at [facility], [place] [address] [place], [place] [address]    Report Status PENDING  Incomplete  MRSA PCR Screening     Status: None   Collection Time: 04/13/18  7:18 PM  Result Value Ref Range Status   MRSA by PCR NEGATIVE NEGATIVE Final    Comment:        The GeneXpert MRSA Assay (FDA approved for NASAL specimens only), is one component of a comprehensive MRSA colonization surveillance program. It is not intended to diagnose MRSA infection nor to guide or monitor treatment for MRSA infections. Performed at [facility], [place] [address] [place], [place] [address]   C difficile quick scan w PCR reflex     Status: None   Collection Time: 04/13/18  8:41 PM  Result Value Ref Range Status   C Diff antigen NEGATIVE NEGATIVE Final   C Diff  toxin NEGATIVE NEGATIVE Final   C Diff interpretation No C. difficile detected.  Final    Comment: Performed at [facility], [place] [address], [place], [place] [address]         Radiology Studies: Dg Chest Port 1 View  Result Date: 04/13/2018 CLINICAL DATA:  Hypoxia.  History of COPD current smoker. EXAM: PORTABLE CHEST 1 VIEW COMPARISON:  Chest x-ray and chest CT scan of April 11, 2018 FINDINGS: The cardiopericardial silhouette remains enlarged. There is increased soft tissue density in the right paratracheal and right hilar regions new since the previous study. There is no pleural effusion. The interstitial markings of both lungs are coarse but stable. The pulmonary vascularity is mildly engorged. IMPRESSION: Increased soft tissue density in the right hilar and right paratracheal region. This may reflect collapse of portions of the right middle and lower lobes. Lymphadenopathy is felt less likely. There is stable enlargement of the cardiac silhouette with mild pulmonary vascular congestion consistent with CHF. Chest CT scanning is recommended if the Lisa Ewing can tolerate the procedure. Electronically Signed   By: David  Martinique M.D.   On: 04/13/2018 09:26        Scheduled Meds: . atorvastatin  40 mg Oral q1800  . budesonide (PULMICORT) nebulizer solution  0.5 mg Nebulization BID  . calcitRIOL  0.25 mcg Oral Daily  . chlorhexidine  15 mL Mouth Rinse BID  . colchicine  0.6 mg Oral BID  . escitalopram  20 mg Oral Daily  . ezetimibe  10 mg  Oral Daily  . fenofibrate  54 mg Oral Daily  . gabapentin  300 mg Oral Q24H  . gabapentin  600 mg Oral BID  . heparin  5,000 Units Subcutaneous Q8H  . insulin aspart  0-9 Units Subcutaneous TID WC  . ipratropium-albuterol  3 mL Nebulization Q6H  . levothyroxine  68 mcg Intravenous Daily  . linagliptin  5 mg Oral Daily  . loratadine  10 mg Oral Daily  . mouth rinse  15 mL Mouth Rinse q12n4p  . metFORMIN  1,000 mg Oral BID WC  .  methylPREDNISolone (SOLU-MEDROL) injection  60 mg Intravenous Q6H  . mometasone-formoterol  2 puff Inhalation BID  . montelukast  10 mg Oral QHS  . naLOXone (NARCAN)  injection  0.8 mg Intravenous Once  . pantoprazole  40 mg Oral BID  . sodium chloride flush  10-40 mL Intracatheter Q12H  . sodium chloride flush  3 mL Intravenous Q12H  . sodium chloride flush  3 mL Intravenous Q12H   Continuous Infusions: . sodium chloride    . sodium chloride 10 mL/hr at 04/14/18 1706  . sodium chloride    . ciprofloxacin 400 mg (04/14/18 1709)     LOS: 1 day    Time spent: 35 minutes.    Dana Allan, MD  [facility] Pager #: [phone] 7PM-7AM contact night coverage as above

## 2018-04-14 NOTE — Consult Note (Signed)
Name: Lisa Ewing MRN: [id number] DOB: [birth year]    ADMISSION DATE:  04/11/2018 CONSULTATION DATE:  04/13/2017  REFERRING MD :  Dr. Irish Lack  CHIEF COMPLAINT:  Respiratory distress/ AMS  HISTORY OF PRESENT ILLNESS:   HPI obtained from medical chart review as patient is on BiPAP and confused.   62 year old female with PMH significant for but not limited to chronic pericardial effusion, ongoing tobacco abuse, COPD, DM type 2, HLD and chronic pain who was originally admitted at [facility] with abdominal pain, imaging consistent with colitis.  She is additionally been treated for UTI and COPD exacerbation.  She developed worsening respiratory distress on 8/6 requiring BiPAP with limited Echo which showed large pericardial effusion with dilated IVC, 25% MV respiratory variation, and RA collapse without RV compromise, otherwise has been hemodynamically stable.   Cardiology was consulted and transferred to [facility] where she underwent a pericardiocentesis with 550 ml of serosanguinous fluid removed and drain left in place.  She receieved total of fentanyl 50 mcg during procedure and returned to ICU post-op where she remained somulent.  ABG showing 7.292/66/75/31.9 on Bipap 12/6 and 40%.  PCCM consulted for further management.   8/7 The patient is fully alert today. No major complaints. She is a little sore related to the catheter. Respiratory status is good. She is conversant. On nasal cannula  PAST MEDICAL HISTORY :  She  has a past medical history of Abnormal involuntary movements(781.0), Allergic rhinitis, Asthma, Back fracture, Barrett esophagus, Broken hip ([place]), Bursitis of left hip, Cervicalgia, Chronic pain, COPD (chronic obstructive pulmonary disease) ([place]), Depression, Diabetes mellitus without complication ([place]), Disorder of sacroiliac joint, GERD (gastroesophageal reflux disease), History of uterine cancer (1998), cervical cancer, Hyperlipidemia, Hyperplastic colon polyp, Hypothyroid  (07/11/2013), Lumbago, Osteoporosis, Pain in joint, pelvic region and thigh, Pain in joint, upper arm, Sciatica, Shingles, Short-term memory loss, Sleep apnea, Sleep apnea, Smoker, TMJ (dislocation of temporomandibular joint), Trochanteric bursitis, UTI (lower urinary tract infection), and Vitamin D deficiency.  PAST SURGICAL HISTORY: She  has a past surgical history that includes S/P Hysterectomy (1999); Thyroidectomy (2007); Rotator cuff repair (Right, 2006); thumb surgery (2010); Esophagogastroduodenoscopy (05/02/2010); Colonoscopy (05/02/2010); Abdominal hysterectomy; Hip surgery (Right); Back surgery (1995); Diagnostic laparoscopy; Colonoscopy with propofol (N/A, 02/12/2015); Esophagogastroduodenoscopy (egd) with propofol (N/A, 02/12/2015); Esophageal dilation (N/A, 02/12/2015); biopsy (N/A, 02/12/2015); polypectomy (N/A, 02/12/2015); Carpal bone excision (Right, 03/01/2015); Carpometacarpel [facility]) suspension plasty (Right, 03/01/2015); Tendon transfer (Right, 03/01/2015); and PERICARDIOCENTESIS (N/A, 04/13/2018).  Allergies  Allergen Reactions  . Bee Venom Anaphylaxis  . Latex Shortness Of Breath  . Oxycodone-Acetaminophen Hives and Shortness Of Breath    Upset Stomach  . Penicillins Anaphylaxis    Throat swells Has patient had a PCN reaction causing immediate rash, facial/tongue/throat swelling, SOB or lightheadedness with hypotension: yes Has patient had a PCN reaction causing severe rash involving mucus membranes or skin necrosis: no Has patient had a PCN reaction that required hospitalization- yes at hospital Has patient had a PCN reaction occurring within the last 10 years: no If all of the above answers are "NO", then may proceed with Cephalosporin use.   . Rocephin [Ceftriaxone Sodium In Dextrose] Swelling    Tingling around lips  . Shellfish Allergy Anaphylaxis    Throat swells   . Codeine Nausea Only  . Metoclopramide Hcl     Doesn't recall type of reaction  . Pregabalin Swelling  .  Sulfonamide Derivatives     Tongue swells   . Adhesive [Tape] Rash  . Other  Swelling, Itching and Rash    Almonds. Bananas cause an asthma attack.   . Wellbutrin [Bupropion] Rash    Hives, red whelps    No current facility-administered medications on file prior to encounter.    Current Outpatient Medications on File Prior to Encounter  Medication Sig  . acyclovir (ZOVIRAX) 400 MG tablet TAKE ONE TABLET BY MOUTH ONCE DAILY.  Marland Kitchen ADVAIR DISKUS 250-50 MCG/DOSE AEPB INHALE 1 PUFF TWICE DAILY; RINSE AFTER USE.  Marland Kitchen albuterol (PROVENTIL HFA) 108 (90 Base) MCG/ACT inhaler USE 2 PUFFS EVERY 4 TO 6 HOURS AS NEEDED FOR SHORTNESS OF BREATH  . atorvastatin (LIPITOR) 40 MG tablet TAKE 1 TABLET BY MOUTH ONCE DAILY AT 6:00 PM.  . calcitRIOL (ROCALTROL) 0.25 MCG capsule TAKE ONE CAPSULE BY MOUTH ONCE DAILY (Patient taking differently: TAKE 0.11mg CAPSULE BY MOUTH ONCE DAILY)  . cetirizine (ZYRTEC) 10 MG tablet TAKE ONE TABLET BY MOUTH ONCE DAILY.  .Marland KitchenEPINEPHrine 0.3 mg/0.3 mL IJ SOAJ injection INJECT 1 PEN INTRAMUSCULARLY AS NEEDED FOR ALLERGIC REACTION  . escitalopram (LEXAPRO) 20 MG tablet TAKE 1 TABLET EVERY DAY.  .Marland Kitchenezetimibe (ZETIA) 10 MG tablet TAKE ONE TABLET BY MOUTH ONCE DAILY.  . fenofibrate (TRICOR) 145 MG tablet Take 1 tablet (145 mg total) by mouth daily.  . furosemide (LASIX) 20 MG tablet TAKE ONE TABLET BY MOUTH ONCE DAILY.  .Marland Kitchengabapentin (NEURONTIN) 300 MG capsule Take two capsule in the morning, one capsule in the afternoon  and 2 capsules at bedtime (Patient taking differently: Take 300-600 mg by mouth See admin instructions. Take two capsule in the morning, one capsule in the afternoon  and 2 capsules at bedtime)  . HYDROcodone-acetaminophen (NORCO) 10-325 MG tablet Take 1 tablet by mouth 3 (three) times daily.  . hyoscyamine (LEVSIN SL) 0.125 MG SL tablet PLACE 1 TABLET UNDER THE TONGUE EVERY 4 HOURS AS NEEDED (Patient taking differently: PLACE 1 TABLET UNDER THE TONGUE EVERY 4 HOURS AS  NEEDED for cramping)  . ipratropium-albuterol (DUONEB) 0.5-2.5 (3) MG/3ML SOLN INHALE THE CONTENTS OF ONE VIAL VIA NEBULIZER BY MOUTH FOUR TIMES A DAY AS NEEDED FOR WHEEZING OR SHORTNESS OF BREATH  . JANUVIA 100 MG tablet TAKE ONE TABLET BY MOUTH ONCE DAILY.  .Marland Kitchenlevothyroxine (SYNTHROID, LEVOTHROID) 137 MCG tablet TAKE 1 TABLET BEFORE BREAKFAST.  . metFORMIN (GLUCOPHAGE) 1000 MG tablet TAKE 1 TABLET BY MOUTH TWICE DAILY WITH MEALS.  . methocarbamol (ROBAXIN) 500 MG tablet TAKE (1) TABLET BY MOUTH TWICE A DAY AS NEEDED. (Patient taking differently: Take 500 mg by mouth 2 (two) times daily as needed for muscle spasms. )  . montelukast (SINGULAIR) 10 MG tablet TAKE (1) TABLET BY MOUTH AT BEDTIME.  .Marland KitchenNIASPAN 1000 MG CR tablet TAKE (2) TABLETS BY MOUTH AT BEDTIME.  .Marland Kitchennystatin cream (MYCOSTATIN) APPLY 1 APPLICATION TO AFFECTED AREA(S) TWICE DAILY as needed for irritations  . pantoprazole (PROTONIX) 40 MG tablet TAKE ONE TABLET BY MOUTH 2 TIMES A DAY BEFORE A MEAL.  . pioglitazone (ACTOS) 45 MG tablet TAKE ONE TABLET BY MOUTH ONCE DAILY.  . raloxifene (EVISTA) 60 MG tablet Take 1 tablet (60 mg total) by mouth daily.  . sucralfate (CARAFATE) 1 g tablet TAKE 1g TABLET BY MOUTH 4 TIMES A DAY WITH MEALS AND AT BEDTIME as needed for stomach coating  . traZODone (DESYREL) 100 MG tablet Take 1 tablet (100 mg total) by mouth at bedtime.  . triamcinolone (KENALOG) 0.025 % cream APPLY 1 APPLICATION TOPICALLY TO AFFECTED AREA(S) TWICE DAILY as needed  .  VESICARE 5 MG tablet TAKE ONE TABLET BY MOUTH ONCE DAILY.  . VOLTAREN 1 % GEL APPLY 2 GRAMS TO AFFECTED AREA FOUR TIMES A DAY.  Marland Kitchen ACCU-CHEK FASTCLIX LANCETS MISC 1 each by Other route daily.  Marland Kitchen doxycycline (VIBRAMYCIN) 100 MG capsule Take 1 capsule (100 mg total) by mouth 2 (two) times daily. (Patient not taking: Reported on 04/11/2018)  . glucose blood (ACCU-CHEK AVIVA PLUS) test strip USE TO TEST BLOOD SUGAR ONCE DAILY  . levofloxacin (LEVAQUIN) 500 MG tablet Take 1  tablet (500 mg total) by mouth daily. (Patient not taking: Reported on 04/11/2018)  . nitrofurantoin, macrocrystal-monohydrate, (MACROBID) 100 MG capsule Take 1 capsule (100 mg total) by mouth 2 (two) times daily. (Patient not taking: Reported on 04/11/2018)  . [DISCONTINUED] solifenacin (VESICARE) 5 MG tablet Take 10 mg by mouth daily.    FAMILY HISTORY:  Her family history includes Breast cancer in her maternal aunt; Diabetes in her father; Hyperlipidemia in her mother; Throat cancer in her maternal grandmother. There is no history of Colon cancer.  SOCIAL HISTORY: She  reports that she has been smoking cigarettes.  She has a 42.00 pack-year smoking history. She has never used smokeless tobacco. She reports that she drank alcohol. She reports that she does not use drugs.  REVIEW OF SYSTEMS:   Unable to obtain as patient is confused on BiPAP  SUBJECTIVE:   VITAL SIGNS: BP 96/61   Pulse 97   Temp 98.5 F (36.9 C) (Oral)   Resp 17   Ht '5\' 5"'$  (1.651 m)   Wt 292 lb 5.3 oz (132.6 kg)   LMP  (LMP Unknown)   SpO2 98%   BMI 48.65 kg/m   HEMODYNAMICS:    VENTILATOR SETTINGS: Vent Mode: BIPAP FiO2 (%):  [35 %-100 %] 35 % Set Rate:  [14 bmp] 14 bmp PEEP:  [4 cmH20-6 cmH20] 4 cmH20  INTAKE / OUTPUT: I/O last 3 completed shifts: In: 832.2 [I.V.:81.4; IV Piggyback:750.8] Out: 450 [Urine:100; Drains:350]  PHYSICAL EXAMINATION: General:  Chronically ill appearing female on BiPAP in NAD HEENT: well fitting face mask Neuro: arouses to verbal, follows simple commands, MAE CV:  rrr, +2 pulses, pericardial drain present PULM: even/non-labored on BiPAP, lungs bilaterally coarse, faint exp wheeze, faint L basilar crackles GI: obese, soft, mild tenderness, bs active  Extremities: warm/dry, no edema  Skin: no rashes  LABS:  BMET Recent Labs  Lab 04/12/18 0500 04/13/18 0541 04/13/18 2003 04/14/18 0233  NA 138 138  --  139  K 4.0 4.4  --  3.7  CL 101 103  --  98  CO2 29 29  --  31    BUN 12 10  --  10  CREATININE 0.89 0.76 0.82 0.76  GLUCOSE 148* 163*  --  151*    Electrolytes Recent Labs  Lab 04/12/18 0500 04/13/18 0541 04/14/18 0233  CALCIUM 7.8* 7.7* 8.0*  MG 1.6*  --   --   PHOS 4.1  --   --     CBC Recent Labs  Lab 04/13/18 0541 04/13/18 2003 04/14/18 0233  WBC 12.2* 11.0* 15.5*  HGB 11.2* 10.7* 10.6*  HCT 39.6 39.8 38.2  PLT 307 324 320    Coag's No results for input(s): APTT, INR in the last 168 hours.  Sepsis Markers No results for input(s): LATICACIDVEN, PROCALCITON, O2SATVEN in the last 168 hours.  ABG Recent Labs  Lab 04/13/18 0900 04/13/18 1804 04/13/18 2002  PHART 7.233* 7.292* 7.411  PCO2ART 71.1* 66.0* 45.6  PO2ART 75.9* 75.0* 67.0*    Liver Enzymes Recent Labs  Lab 04/11/18 1742 04/14/18 0233  AST 20 18  ALT 13 19  ALKPHOS 63 69  BILITOT 0.6 0.7  ALBUMIN 3.3* 2.6*    Cardiac Enzymes Recent Labs  Lab 04/11/18 2243  TROPONINI <0.03    Glucose Recent Labs  Lab 04/13/18 0847 04/13/18 1109 04/13/18 1559 04/13/18 1834 04/13/18 2159 04/14/18 0612  GLUCAP 172* 161* 144* 177* 151* 215*    STUDIES:  8/5 CT abd/ pelvis >> 1. Circumferential mural edema and thickening with pericolonic edema noted in the ascending, transverse, and descending colon. Imaging features compatible with infectious/inflammatory colitis.  2. Mild to moderate pericardial effusion appears similar to yesterday's chest CT. 3.  Aortic Atherosclerois    8/6 CXR >>  Increased soft tissue density in the right hilar and right paratracheal region. This may reflect collapse of portions of the right middle and lower lobes. Lymphadenopathy is felt less likely. There is stable enlargement of the cardiac silhouette with mild pulmonary vascular congestion consistent with CHF. Chest CT scanning is recommended if the patient can tolerate the procedure.  CULTURES: 8/5 UC >>  Cdiff >>> (not sent as of 8/6 secondary to no stool) 8/5 GI PCR >> +  salmonella species  8/6 pericardial fluid cx >> 8/6 pericardial GS >> 8/6 MRSA PCR >> 8/6 BC >>  ANTIBIOTICS: 8/4 Levaquin x 1 8/5 acyclovir x 1 8/5 flagyl >>8/6 8/6 cipro >>  SIGNIFICANT EVENTS: 8/4 Admit to [facility]  8/6 tx to [facility]  LINES/TUBES: PIV x 2 8/6 Pericardial drain >>   ASSESSMENT / PLAN:  Acute COPD exacerbation Acute hypoxic/ hypercarbic respiratory insufficiency Tobacco abuse  -  ABG now is actually better than ABG earlier on HFNC of 7.233/71.1/75.9/24.7 P:  Narcan now BiPAP adjusted per Dr. Burman Riis for SpO2> 92% Repeat ABG in 1hour Remains high risk for intubation Minimize sedating medications Continue pulmicort, duonebs q 6, dulera, and singulair Solumedrol '60mg'$  q 6 Albuterol q 4prn  8/7 Can likely wean steroids off. The patient should use CPAP at HS. Can likely leave the ICU  Pericardial effusion with tamponade physiology s/p pericardiocentesis with drain - previous neg ANA 3/14 - concern for malignancy given prior history of cancer P:  Per cardiology Tele monitoring Follow fluid studies Will send ESR, ANA w/reflex, and RF  8/7 The etiolgy of the effusionis unclear and studies are pending.   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 - Cdiff not sent as no stools to send P:  Continue cipro, change to PO when able D/c flagyl Send blood cultures 8/7 Consider ID consult  UTI P:  Cipro as above Follow UC  Leukocytosis- minimal  - likely related to above, afebrile  - currently hemodynamically stable P: - send for blood cultures - continue cipro as above, change to PO when able - Trend WBC/ fever curve   GERD P:  Protonix BID   Depression Chronic pain P:  Continue lexapro and Neurontin  Hold sedating meds with tenuous resp status   DM P:  CBG q 4  SSI   Hypothyroidism P:  Synthroid daily   DVT prophylaxis: heparin SQ / SCDs SUP: PPI Diet: NPO Activity: bedrest  Disposition : ICU 8/7 Her TFTs  appear to be in the normal range   I believe we will sign off at this time. reconsult if further asssistance is needed. Can enlist the help of the [facility] service.  Micheal Likens MD [facility] Pgr: [phone] or if no answer [phone] 04/14/2018, 9:12 AM    Updates:  No family at bedside.

## 2018-04-14 NOTE — Progress Notes (Signed)
Discussed with patient POC with placing best vascular access. Also spoke with nurse Danley Danker RN regarding possible PICC line placement d/t limited vascular access, d/t deep veins. VU. Fran Lowes, RN VAST

## 2018-04-14 NOTE — Progress Notes (Signed)
Progress Note  Patient Name: Lisa Ewing Date of Encounter: 04/14/2018  Primary Cardiologist: Branch   Subjective   62 yo admitted  / transferred from [facility] with severe dyspnea and large pericardial effusion. Dr. Irish Lack drained 550 cc at the time of the pericardiocentesis . Has drained 130 cc last night and now has 20-30 cc in the accordian drain .  she is feeling better. Breathing better. No cp    Inpatient Medications    Scheduled Meds: . atorvastatin  40 mg Oral q1800  . budesonide (PULMICORT) nebulizer solution  0.5 mg Nebulization BID  . calcitRIOL  0.25 mcg Oral Daily  . chlorhexidine  15 mL Mouth Rinse BID  . escitalopram  20 mg Oral Daily  . ezetimibe  10 mg Oral Daily  . fenofibrate  54 mg Oral Daily  . gabapentin  300 mg Oral Q24H  . gabapentin  600 mg Oral BID  . heparin  5,000 Units Subcutaneous Q8H  . insulin aspart  0-9 Units Subcutaneous TID WC  . ipratropium-albuterol  3 mL Nebulization Q6H  . levothyroxine  68 mcg Intravenous Daily  . linagliptin  5 mg Oral Daily  . loratadine  10 mg Oral Daily  . mouth rinse  15 mL Mouth Rinse q12n4p  . metFORMIN  1,000 mg Oral BID WC  . methylPREDNISolone (SOLU-MEDROL) injection  60 mg Intravenous Q6H  . mometasone-formoterol  2 puff Inhalation BID  . montelukast  10 mg Oral QHS  . naLOXone (NARCAN)  injection  0.8 mg Intravenous Once  . pantoprazole  40 mg Oral BID  . sodium chloride flush  3 mL Intravenous Q12H  . sodium chloride flush  3 mL Intravenous Q12H   Continuous Infusions: . sodium chloride    . sodium chloride 10 mL/hr at 04/13/18 2208  . sodium chloride    . ciprofloxacin 400 mg (04/14/18 0510)   PRN Meds: sodium chloride, sodium chloride, acetaminophen, albuterol, hyoscyamine, ondansetron **OR** ondansetron (ZOFRAN) IV, sodium chloride flush, sodium chloride flush   Vital Signs    Vitals:   04/14/18 0600 04/14/18 0630 04/14/18 0700 04/14/18 0808  BP: 114/72   96/61  Pulse: 96 96 93  97  Resp: 16 16 18 17   Temp:    98.5 F (36.9 C)  TempSrc:    Oral  SpO2: 94% 96% 98% 98%  Weight:      Height:        Intake/Output Summary (Last 24 hours) at 04/14/2018 0837 Last data filed at 04/14/2018 0500 Gross per 24 hour  Intake 365.52 ml  Output 450 ml  Net -84.48 ml   Filed Weights   04/11/18 1712 04/12/18 0326 04/14/18 0500  Weight: 300 lb (136.1 kg) 297 lb 9.9 oz (135 kg) 292 lb 5.3 oz (132.6 kg)    Telemetry    NSR  - Personally Reviewed  ECG     NSR  - Personally Reviewed  Physical Exam   GEN:  morbidly obese female,  nad    Neck: No JVD Cardiac: RRR, no murmurs, soft pericardial rub .  Respiratory: Clear to auscultation bilaterally. GI: Soft, nontender, non-distended  MS: No edema; No deformity. Neuro:  Nonfocal  Psych: Normal affect   Labs    Chemistry Recent Labs  Lab 04/11/18 1742 04/12/18 0500 04/13/18 0541 04/13/18 2003 04/14/18 0233  NA 134* 138 138  --  139  K 3.5 4.0 4.4  --  3.7  CL 96* 101 103  --  98  CO2 28 29 29   --  31  GLUCOSE 167* 148* 163*  --  151*  BUN 13 12 10   --  10  CREATININE 1.18* 0.89 0.76 0.82 0.76  CALCIUM 8.1* 7.8* 7.7*  --  8.0*  PROT 7.2  --   --   --  6.1*  ALBUMIN 3.3*  --   --   --  2.6*  AST 20  --   --   --  18  ALT 13  --   --   --  19  ALKPHOS 63  --   --   --  69  BILITOT 0.6  --   --   --  0.7  GFRNONAA 48* >60 >60 >60 >60  GFRAA 56* >60 >60 >60 >60  ANIONGAP 10 8 6   --  10     Hematology Recent Labs  Lab 04/13/18 0541 04/13/18 2003 04/14/18 0233  WBC 12.2* 11.0* 15.5*  RBC 5.19* 5.10 5.09  HGB 11.2* 10.7* 10.6*  HCT 39.6 39.8 38.2  MCV 76.3* 78.0 75.0*  MCH 21.6* 21.0* 20.8*  MCHC 28.3* 26.9* 27.7*  RDW 20.7* 21.0* 20.8*  PLT 307 324 320    Cardiac Enzymes Recent Labs  Lab 04/11/18 2243  TROPONINI <0.03   No results for input(s): TROPIPOC in the last 168 hours.   BNP Recent Labs  Lab 04/11/18 2242  BNP 27.0     DDimer No results for input(s): DDIMER in the last 168  hours.   Radiology    Ct Abdomen Pelvis Wo Contrast  Result Date: 04/12/2018 CLINICAL DATA:  Generalized abdominal pain for 4 days. EXAM: CT ABDOMEN AND PELVIS WITHOUT CONTRAST TECHNIQUE: Multidetector CT imaging of the abdomen and pelvis was performed following the standard protocol without IV contrast. COMPARISON:  07/01/2017 FINDINGS: Lower chest: Pericardial effusion noted measuring up to 3.7 cm in thickness adjacent to the left ventricle. There is bilateral lower lobe collapse/consolidation. Hepatobiliary: The liver shows diffusely decreased attenuation suggesting steatosis. Layering gallbladder sludge. No intrahepatic or extrahepatic biliary dilation. Pancreas: No focal mass lesion. No dilatation of the main duct. No intraparenchymal cyst. No peripancreatic edema. Spleen: No splenomegaly. No focal mass lesion. Adrenals/Urinary Tract: No adrenal nodule or mass. Contrast excretion in the kidneys is compatible with contrast infused CT scan yesterday. Kidneys otherwise unremarkable. No evidence for hydroureter. The urinary bladder appears normal for the degree of distention. Stomach/Bowel: Stomach is nondistended. No gastric wall thickening. No evidence of outlet obstruction. Duodenum is normally positioned as is the ligament of Treitz. No small bowel wall thickening. No small bowel dilatation. The terminal ileum is normal. Diffuse circumferential wall thickening with mural and pericolonic edema noted in the right and transverse colon extending distally to the sigmoid segment. Sigmoid colon and rectum are relatively normal in CT appearance. Vascular/Lymphatic: There is abdominal aortic atherosclerosis without aneurysm. There is no gastrohepatic or hepatoduodenal ligament lymphadenopathy. No intraperitoneal or retroperitoneal lymphadenopathy. Clustered normal size lymph nodes are seen in the ileocolic mesentery. No pelvic sidewall lymphadenopathy. Reproductive: Uterus surgically absent.  There is no adnexal  mass. Other: No intraperitoneal free fluid. Musculoskeletal: Small umbilical hernia contains only fat. Status post right total hip replacement. No worrisome lytic or sclerotic osseous abnormality. Multiple Schmorl's nodes are evident in the thoracolumbar spine. IMPRESSION: 1. Circumferential mural edema and thickening with pericolonic edema noted in the ascending, transverse, and descending colon. Imaging features compatible with infectious/inflammatory colitis. 2. Mild to moderate pericardial effusion appears similar to yesterday's chest CT. 3.  Aortic Atherosclerois (ICD10-170.0) Electronically Signed   By: Misty Stanley M.D.   On: 04/12/2018 17:25   Dg Chest Port 1 View  Result Date: 04/13/2018 CLINICAL DATA:  Hypoxia.  History of COPD current smoker. EXAM: PORTABLE CHEST 1 VIEW COMPARISON:  Chest x-ray and chest CT scan of April 11, 2018 FINDINGS: The cardiopericardial silhouette remains enlarged. There is increased soft tissue density in the right paratracheal and right hilar regions new since the previous study. There is no pleural effusion. The interstitial markings of both lungs are coarse but stable. The pulmonary vascularity is mildly engorged. IMPRESSION: Increased soft tissue density in the right hilar and right paratracheal region. This may reflect collapse of portions of the right middle and lower lobes. Lymphadenopathy is felt less likely. There is stable enlargement of the cardiac silhouette with mild pulmonary vascular congestion consistent with CHF. Chest CT scanning is recommended if the patient can tolerate the procedure. Electronically Signed   By: David  Martinique M.D.   On: 04/13/2018 09:26    Cardiac Studies      Patient Profile     62 y.o. female  With large pericardial effusion   Assessment & Plan    1.   Pericardial effusion:   S/p pericardiocentesis Doing well Still draining fairly significantly  550 cc at the time of pericardiocentesis,   350 cc for the day .  Will keep  drain in . Will let her eat.    No signs of needing to go for pericardial window today   2.  Diarrhea:   Has a rectal tube.   Will DC rectal tube.   Diarrhea seems to be better         For questions or updates, please contact [place] Please consult [email or web address] for contact info under Cardiology/STEMI.      Signed, Mertie Moores, MD  04/14/2018, 8:37 AM

## 2018-04-14 NOTE — Plan of Care (Signed)
Pt continues to improve. Will continue to monitor

## 2018-04-14 NOTE — Progress Notes (Signed)
Infectious disease called and asked me to keep patient on enteric precautions due to positive GI salmonella. Negative for C-Diff. Re-initiated enteric precautions. Will continue to monitor

## 2018-04-15 ENCOUNTER — Inpatient Hospital Stay (HOSPITAL_COMMUNITY): Payer: Medicaid Other

## 2018-04-15 DIAGNOSIS — I313 Pericardial effusion (noninflammatory): Secondary | ICD-10-CM

## 2018-04-15 LAB — BLOOD GAS, ARTERIAL
Acid-Base Excess: 2.1 mmol/L — ABNORMAL HIGH (ref 0.0–2.0)
BICARBONATE: 24.7 mmol/L (ref 20.0–28.0)
DRAWN BY: 234301
O2 Content: 6 L/min
O2 Saturation: 92.3 %
PCO2 ART: 71.1 mmHg — AB (ref 32.0–48.0)
PH ART: 7.233 — AB (ref 7.350–7.450)
PO2 ART: 75.9 mmHg — AB (ref 83.0–108.0)

## 2018-04-15 LAB — GLUCOSE, CAPILLARY
GLUCOSE-CAPILLARY: 200 mg/dL — AB (ref 70–99)
GLUCOSE-CAPILLARY: 248 mg/dL — AB (ref 70–99)
Glucose-Capillary: 179 mg/dL — ABNORMAL HIGH (ref 70–99)
Glucose-Capillary: 160 mg/dL — ABNORMAL HIGH (ref 70–99)

## 2018-04-15 LAB — ECHOCARDIOGRAM LIMITED
HEIGHTINCHES: 65 in
Weight: 4761.94 oz

## 2018-04-15 LAB — ANA W/REFLEX IF POSITIVE: Anti Nuclear Antibody(ANA): NEGATIVE

## 2018-04-15 LAB — RHEUMATOID FACTOR: RHEUMATOID FACTOR: 11.1 [IU]/mL (ref 0.0–13.9)

## 2018-04-15 MED ORDER — IPRATROPIUM-ALBUTEROL 0.5-2.5 (3) MG/3ML IN SOLN
3.0000 mL | Freq: Three times a day (TID) | RESPIRATORY_TRACT | Status: DC
  Administered 2018-04-15 – 2018-04-16 (×6): 3 mL via RESPIRATORY_TRACT
  Filled 2018-04-15 (×7): qty 3

## 2018-04-15 MED ORDER — CIPROFLOXACIN HCL 500 MG PO TABS
500.0000 mg | ORAL_TABLET | Freq: Two times a day (BID) | ORAL | Status: AC
  Administered 2018-04-15 – 2018-04-18 (×7): 500 mg via ORAL
  Filled 2018-04-15 (×9): qty 1

## 2018-04-15 MED ORDER — LEVOTHYROXINE SODIUM 25 MCG PO TABS
137.0000 ug | ORAL_TABLET | Freq: Every day | ORAL | Status: DC
Start: 2018-04-16 — End: 2018-04-19
  Administered 2018-04-16 – 2018-04-19 (×4): 137 ug via ORAL
  Filled 2018-04-15 (×4): qty 1

## 2018-04-15 NOTE — Progress Notes (Signed)
[facility]  Patient Status: Active (receiving services up to time of hospitalization)  [facility] is providing the following services: RN  If patient discharges after hours, please call [phone].   Lisa Ewing 04/15/2018, 11:20 AM

## 2018-04-15 NOTE — Progress Notes (Signed)
PROGRESS NOTE    Lisa Ewing  NWG:[id number] DOB: 1956/08/17 DOA: 04/11/2018 PCP: Orlena Sheldon, PA-C  Outpatient Specialists:     Brief Narrative: Lisa Ewing is a 62 year old female, morbidly obese, with past medical history significant for  recurrent pericardial effusion, abnormal involuntary movements, allergic rhinitis, asthma, COPD, tobacco abuse, Barrett's esophagus, left hip bursitis, chronic pain syndrome, depression, type 2 diabetes, GERD, history of uterine cancer, hyperlipidemia, hypothyroidism, OSA noncompliant with CPAP, likely undiagnosed OHS, chronic back pain, osteoporosis, sciatica, shingles, sleep apnea, vitamin D deficiency.  Patient was admitted with symptomatic large pericardial effusion.  Patient has undergone pericardiocentesis.  The cardiology team is managing.  Patient also has COPD with exacerbation, acute on chronic combined respiratory failure, possible UTI, and diarrhea secondary to Salmonella.  Diarrhea has resolved.  Patient is still on ciprofloxacin.  Pericardial fluid drain still in situ.  Respiratory symptoms are improving.  04/15/2018: Repeat echo was done today.  Pericardial fluid drainage still in place, and significantly draining.  Cardiology input is appreciated.  Pericardial window is been contemplated.  Patient reported one episode of diarrhea stool earlier this morning.  Patient actually looks better today.  Assessment & Plan:   Principal Problem:   Pericardial effusion Active Problems:   GERD   Diarrhea   COPD (chronic obstructive pulmonary disease) (HCC)   Depression   Hyperlipidemia   Hypothyroid   Smoker   Diabetes mellitus type 2, uncomplicated (HCC)   Sleep apnea   Acute lower UTI   Abdominal pain   Acute gastroenteritis   Intractable vomiting with nausea   Obesity, Class III, BMI 40-49.9 (morbid obesity) (HCC)   Dehydration   Acute respiratory failure with hypoxia and hypercapnia (HCC)   Acute metabolic encephalopathy   Nausea  vomiting and diarrhea   Pericardial tamponade   Pericardial effusion, recurrent: Patient has undergone pericardial centesis. Patient has pericardial drain in situ Cardiology is managing.  Kindly see above.  Possible acute lower UTI Urine cultures growing Salmonella  Stool culture is growing Salmonella  It is possible that the specimen is contaminated.   Patient is on Cipro.  Complete course of antibiotics.  Diarrhea secondary to Salmonella: Diarrhea has improved significantly.  Only one episode of diarrhea stool reported today.   Complete course of antibiotics.  COPD with exacerbation/tobacco abuse/respiratory failure:  Continue current management. Patient is improving.  GERD Protonix 40 mg p.o. twice daily.  Depression Resume citalopram once tolerating oral intake.  Hyperlipidemia Resume meds once clinically improved.  Hypothyroid Continue levothyroxine 137 mcg p.o. daily.   Diabetes mellitus type 2, uncomplicated ([place]) Resume oral hypoglycemics once cleared for oral intake.  Sleep apnea Not compliant with CPAP.   Morbid obesity.  DVT prophylaxis: Lovenox SQ. Code Status: Full code. Family Communication:  Disposition Plan:  Observation for IV hydration and abdominal pain treatment. Consults called:    Procedures:   Pericardiocentesis.  Antimicrobials:   Cipro IV   Subjective: Patient is not back to baseline, but continues to improve.. Shortness of breath has improved significantly.   Objective: Vitals:   04/15/18 1500 04/15/18 1600 04/15/18 1630 04/15/18 1700  BP: 127/76 129/77  136/77  Pulse: 97 97  (!) 101  Resp: 14 16  (!) 21  Temp:   97.6 F (36.4 C)   TempSrc:   Oral   SpO2: 97% 97%  95%  Weight:      Height:        Intake/Output Summary (Last 24 hours) at 04/15/2018 1858 Last data  filed at 04/15/2018 1200 Gross per 24 hour  Intake 699.51 ml  Output 1090 ml  Net -390.49 ml   Filed Weights   04/12/18 0326 04/14/18 0500  04/15/18 0500  Weight: 135 kg 132.6 kg 135 kg    Examination:  General exam: Morbidly obese.  Appears calm and comfortable  Respiratory system: Decreased air entry.   Cardiovascular system: S1 & S2 heard Gastrointestinal system: Abdomen is obese, soft and nontender.  Organs are difficult to assess.   Central nervous system: Alert and oriented. No focal neurological deficits. Extremities: No leg edema.    Data Reviewed: I have personally reviewed following labs and imaging studies  CBC: Recent Labs  Lab 04/11/18 1742 04/12/18 0500 04/13/18 0541 04/13/18 2003 04/14/18 0233  WBC 13.1* 12.8* 12.2* 11.0* 15.5*  NEUTROABS  --  10.2*  --   --   --   HGB 11.5* 11.3* 11.2* 10.7* 10.6*  HCT 39.4 38.7 39.6 39.8 38.2  MCV 73.4* 75.0* 76.3* 78.0 75.0*  PLT 324 326 307 324 798   Basic Metabolic Panel: Recent Labs  Lab 04/11/18 1742 04/12/18 0500 04/13/18 0541 04/13/18 2003 04/14/18 0233  NA 134* 138 138  --  139  K 3.5 4.0 4.4  --  3.7  CL 96* 101 103  --  98  CO2 28 29 29   --  31  GLUCOSE 167* 148* 163*  --  151*  BUN 13 12 10   --  10  CREATININE 1.18* 0.89 0.76 0.82 0.76  CALCIUM 8.1* 7.8* 7.7*  --  8.0*  MG  --  1.6*  --   --   --   PHOS  --  4.1  --   --   --    GFR: Estimated Creatinine Clearance: 101.5 mL/min (by C-G formula based on SCr of 0.76 mg/dL). Liver Function Tests: Recent Labs  Lab 04/11/18 1742 04/14/18 0233  AST 20 18  ALT 13 19  ALKPHOS 63 69  BILITOT 0.6 0.7  PROT 7.2 6.1*  ALBUMIN 3.3* 2.6*   Recent Labs  Lab 04/11/18 1742  LIPASE 23   No results for input(s): AMMONIA in the last 168 hours. Coagulation Profile: No results for input(s): INR, PROTIME in the last 168 hours. Cardiac Enzymes: Recent Labs  Lab 04/11/18 2243  TROPONINI <0.03   BNP (last 3 results) No results for input(s): PROBNP in the last 8760 hours. HbA1C: No results for input(s): HGBA1C in the last 72 hours. CBG: Recent Labs  Lab 04/14/18 1156 04/14/18 2121  04/15/18 0649 04/15/18 1233 04/15/18 1635  GLUCAP 215* 166* 179* 200* 248*   Lipid Profile: No results for input(s): CHOL, HDL, LDLCALC, TRIG, CHOLHDL, LDLDIRECT in the last 72 hours. Thyroid Function Tests: Recent Labs    04/13/18 2003  TSH 0.788  FREET4 1.03   Anemia Panel: No results for input(s): VITAMINB12, FOLATE, FERRITIN, TIBC, IRON, RETICCTPCT in the last 72 hours. Urine analysis:    Component Value Date/Time   COLORURINE AMBER (A) 04/12/2018 1235   APPEARANCEUR CLOUDY (A) 04/12/2018 1235   LABSPEC >1.046 (H) 04/12/2018 1235   PHURINE 5.0 04/12/2018 1235   GLUCOSEU NEGATIVE 04/12/2018 1235   HGBUR MODERATE (A) 04/12/2018 1235   BILIRUBINUR NEGATIVE 04/12/2018 1235   KETONESUR NEGATIVE 04/12/2018 1235   PROTEINUR 100 (A) 04/12/2018 1235   UROBILINOGEN 0.2 03/14/2015 1026   NITRITE POSITIVE (A) 04/12/2018 1235   LEUKOCYTESUR LARGE (A) 04/12/2018 1235   Sepsis Labs: @LABRCNTIP (procalcitonin:4,lacticidven:4)  ) Recent Results (from the past  240 hour(s))  Culture, Urine     Status: Abnormal (Preliminary result)   Collection Time: 04/12/18 12:35 PM  Result Value Ref Range Status   Specimen Description   Final    URINE, RANDOM Performed at [facility], [address] [place], [place] [address]    Special Requests   Final    NONE Performed at [facility], [address] [place], [place] [address]    Culture (A)  Final    20,000 COLONIES/mL [place] NOTIFIED Referred to [facility] in [place], [place] for Serotyping. Performed at [facility], [place] [address] [place], [place] [address]    Report Status PENDING  Incomplete   Organism ID, Bacteria SALMONELLA SPECIES (A)  Final      Susceptibility   Salmonella species - MIC*    AMPICILLIN <=2 SENSITIVE Sensitive     LEVOFLOXACIN <=0.12 SENSITIVE Sensitive     TRIMETH/SULFA <=20 SENSITIVE Sensitive     * 20,000 COLONIES/mL SALMONELLA SPECIES    Gastrointestinal Panel by PCR , Stool     Status: Abnormal   Collection Time: 04/12/18  9:00 PM  Result Value Ref Range Status   Campylobacter species NOT DETECTED NOT DETECTED Final   Plesimonas shigelloides NOT DETECTED NOT DETECTED Final   Salmonella species DETECTED (A) NOT DETECTED Final    Comment: RESULT CALLED TO, READ BACK BY AND VERIFIED WITH: CHELSEA MOTLEY 04/13/18 1634 KLW    Yersinia enterocolitica NOT DETECTED NOT DETECTED Final   Vibrio species NOT DETECTED NOT DETECTED Final   Vibrio cholerae NOT DETECTED NOT DETECTED Final   Enteroaggregative E coli (EAEC) NOT DETECTED NOT DETECTED Final   Enteropathogenic E coli (EPEC) NOT DETECTED NOT DETECTED Final   Enterotoxigenic E coli (ETEC) NOT DETECTED NOT DETECTED Final   Shiga like toxin producing E coli (STEC) NOT DETECTED NOT DETECTED Final   Shigella/Enteroinvasive E coli (EIEC) NOT DETECTED NOT DETECTED Final   Cryptosporidium NOT DETECTED NOT DETECTED Final   Cyclospora cayetanensis NOT DETECTED NOT DETECTED Final   Entamoeba histolytica NOT DETECTED NOT DETECTED Final   Giardia lamblia NOT DETECTED NOT DETECTED Final   Adenovirus F40/41 NOT DETECTED NOT DETECTED Final   Astrovirus NOT DETECTED NOT DETECTED Final   Norovirus GI/GII NOT DETECTED NOT DETECTED Final   Rotavirus A NOT DETECTED NOT DETECTED Final   Sapovirus (I, II, IV, and V) NOT DETECTED NOT DETECTED Final    Comment: Performed at [facility], [address] [place], [place] [address]  MRSA PCR Screening     Status: None   Collection Time: 04/13/18  1:51 PM  Result Value Ref Range Status   MRSA by PCR NEGATIVE NEGATIVE Final    Comment:        The GeneXpert MRSA Assay (FDA approved for NASAL specimens only), is one component of a comprehensive MRSA colonization surveillance program. It is not intended to diagnose MRSA infection nor to guide or monitor treatment for MRSA infections. Performed at [facility], [address]  [place], [place] [address]   Culture, body fluid-bottle     Status: None (Preliminary result)   Collection Time: 04/13/18  4:50 PM  Result Value Ref Range Status   Specimen Description FLUID PERITONEAL [facility]  Final   Special Requests NONE  Final   Culture   Final    NO GROWTH 2 DAYS Performed at [place] [address], [place],  [address]    Report  Status PENDING  Incomplete  Gram stain     Status: None   Collection Time: 04/13/18  4:50 PM  Result Value Ref Range Status   Specimen Description FLUID PERITONEAL  Final   Special Requests NONE  Final   Gram Stain   Final    NO WBC SEEN NO ORGANISMS SEEN Performed at [facility], [address] [place], [place] [address]    Report Status 04/13/2018 FINAL  Final  Culture, blood (routine x 2)     Status: None (Preliminary result)   Collection Time: 04/13/18  6:56 PM  Result Value Ref Range Status   Specimen Description BLOOD RIGHT ARM  Final   Special Requests   Final    BOTTLES DRAWN AEROBIC AND ANAEROBIC Blood Culture adequate volume   Culture   Final    NO GROWTH 2 DAYS Performed at [facility], [place] [address], [place], [place] [address]    Report Status PENDING  Incomplete  Culture, blood (routine x 2)     Status: None (Preliminary result)   Collection Time: 04/13/18  6:56 PM  Result Value Ref Range Status   Specimen Description BLOOD RIGHT ANTECUBITAL  Final   Special Requests   Final    BOTTLES DRAWN AEROBIC AND ANAEROBIC Blood Culture adequate volume   Culture   Final    NO GROWTH 2 DAYS Performed at [facility], [place] [address] [place], [place] [address]    Report Status PENDING  Incomplete  MRSA PCR Screening     Status: None   Collection Time: 04/13/18  7:18 PM  Result Value Ref Range Status   MRSA by PCR NEGATIVE NEGATIVE Final    Comment:        The GeneXpert MRSA Assay (FDA approved for NASAL specimens only), is one component of a comprehensive MRSA colonization surveillance  program. It is not intended to diagnose MRSA infection nor to guide or monitor treatment for MRSA infections. Performed at [facility], [place] [address], [place], [place] [address]   C difficile quick scan w PCR reflex     Status: None   Collection Time: 04/13/18  8:41 PM  Result Value Ref Range Status   C Diff antigen NEGATIVE NEGATIVE Final   C Diff toxin NEGATIVE NEGATIVE Final   C Diff interpretation No C. difficile detected.  Final    Comment: Performed at [facility], [place] [address], [place], [place] [address]         Radiology Studies: No results found.      Scheduled Meds: . atorvastatin  40 mg Oral q1800  . budesonide (PULMICORT) nebulizer solution  0.5 mg Nebulization BID  . calcitRIOL  0.25 mcg Oral Daily  . chlorhexidine  15 mL Mouth Rinse BID  . ciprofloxacin  500 mg Oral BID  . colchicine  0.6 mg Oral BID  . escitalopram  20 mg Oral Daily  . ezetimibe  10 mg Oral Daily  . fenofibrate  54 mg Oral Daily  . gabapentin  300 mg Oral Q24H  . gabapentin  600 mg Oral BID  . heparin  5,000 Units Subcutaneous Q8H  . insulin aspart  0-9 Units Subcutaneous TID WC  . ipratropium-albuterol  3 mL Nebulization TID  . [START ON 04/16/2018] levothyroxine  137 mcg Oral QAC breakfast  . linagliptin  5 mg Oral Daily  . loratadine  10 mg Oral Daily  . mouth rinse  15 mL Mouth  Rinse q12n4p  . metFORMIN  1,000 mg Oral BID WC  . methylPREDNISolone (SOLU-MEDROL) injection  60 mg Intravenous Q6H  . mometasone-formoterol  2 puff Inhalation BID  . montelukast  10 mg Oral QHS  . naLOXone (NARCAN)  injection  0.8 mg Intravenous Once  . pantoprazole  40 mg Oral BID  . sodium chloride flush  10-40 mL Intracatheter Q12H  . sodium chloride flush  3 mL Intravenous Q12H  . sodium chloride flush  3 mL Intravenous Q12H   Continuous Infusions: . sodium chloride    . sodium chloride 10 mL/hr at 04/15/18 1200  . sodium chloride       LOS: 2 days    Time spent: 35  minutes.    Dana Allan, MD  [facility] Pager #: [phone] 7PM-7AM contact night coverage as above

## 2018-04-15 NOTE — Progress Notes (Addendum)
Dr. Elmarie Shiley 8/8 note accidentally copied over for 8/10 note (instead of pulled forward) . I repasted his original 8/8 note here.      Progress Note  Patient Name: Lisa Ewing Date of Encounter: 04/15/2018  Primary Cardiologist: No primary care provider on file.   Subjective   62 yo admitted  / transferred from [facility] with severe dyspnea and large pericardial effusion.   Dr. Irish Lack drained 550 cc at the time of the pericardiocentesis on 8/6  Cytology, gram stain and fluid analysis c/w transudate.   Minimal drainage over past 36 hours. I reviewed echo personally and small posterior effusion.    Breathing better. No CP.   Inpatient Medications    Scheduled Meds: . atorvastatin  40 mg Oral q1800  . budesonide (PULMICORT) nebulizer solution  0.5 mg Nebulization BID  . calcitRIOL  0.25 mcg Oral Daily  . chlorhexidine  15 mL Mouth Rinse BID  . ciprofloxacin  500 mg Oral BID  . colchicine  0.6 mg Oral BID  . escitalopram  20 mg Oral Daily  . ezetimibe  10 mg Oral Daily  . fenofibrate  54 mg Oral Daily  . gabapentin  300 mg Oral Q24H  . gabapentin  600 mg Oral BID  . heparin  5,000 Units Subcutaneous Q8H  . insulin aspart  0-9 Units Subcutaneous TID WC  . ipratropium-albuterol  3 mL Nebulization TID  . [START ON 04/16/2018] levothyroxine  137 mcg Oral QAC breakfast  . linagliptin  5 mg Oral Daily  . loratadine  10 mg Oral Daily  . mouth rinse  15 mL Mouth Rinse q12n4p  . metFORMIN  1,000 mg Oral BID WC  . methylPREDNISolone (SOLU-MEDROL) injection  60 mg Intravenous Q6H  . mometasone-formoterol  2 puff Inhalation BID  . montelukast  10 mg Oral QHS  . naLOXone (NARCAN)  injection  0.8 mg Intravenous Once  . pantoprazole  40 mg Oral BID  . sodium chloride flush  10-40 mL Intracatheter Q12H  . sodium chloride flush  3 mL Intravenous Q12H  . sodium chloride flush  3 mL Intravenous Q12H   Continuous Infusions: . sodium chloride    . sodium chloride 10 mL/hr at  04/15/18 0700  . sodium chloride     PRN Meds: sodium chloride, sodium chloride, acetaminophen, albuterol, HYDROcodone-acetaminophen, hyoscyamine, ondansetron **OR** ondansetron (ZOFRAN) IV, sodium chloride flush, sodium chloride flush, sodium chloride flush   Vital Signs    Vitals:   04/15/18 0500 04/15/18 0600 04/15/18 0700 04/15/18 0731  BP: 139/79 131/78 (!) 145/80   Pulse: 92 97 96   Resp: 13 (!) 24 12   Temp:    98 F (36.7 C)  TempSrc:    Oral  SpO2: 98% 98% 96%   Weight: 135 kg     Height:        Intake/Output Summary (Last 24 hours) at 04/15/2018 0847 Last data filed at 04/15/2018 0700 Gross per 24 hour  Intake 649.56 ml  Output 1270 ml  Net -620.44 ml   Filed Weights   04/12/18 0326 04/14/18 0500 04/15/18 0500  Weight: 135 kg 132.6 kg 135 kg    Telemetry    NSR 90s  - Personally Reviewed  ECG     NSR  - Personally Reviewed  Physical Exam   General:  Obese woman lying in bed. No resp difficulty HEENT: normal Neck: supple. no obvious JVD. Carotids 2+ bilat; no bruits. No lymphadenopathy or thryomegaly appreciated. Cor: PMI nondisplaced. Regular rate &  rhythm. No rubs, gallops or murmurs. Pericardial drain with minimal draiange Lungs: rhonchi with mild wheeze Abdomen: obese soft, nontender, nondistended. No hepatosplenomegaly. No bruits or masses. Good bowel sounds. Extremities: no cyanosis, clubbing, rash, trace edema Neuro: alert & orientedx3, cranial nerves grossly intact. moves all 4 extremities w/o difficulty. Affect pleasant   Labs    Chemistry Recent Labs  Lab 04/11/18 1742 04/12/18 0500 04/13/18 0541 04/13/18 2003 04/14/18 0233  NA 134* 138 138  --  139  K 3.5 4.0 4.4  --  3.7  CL 96* 101 103  --  98  CO2 28 29 29   --  31  GLUCOSE 167* 148* 163*  --  151*  BUN 13 12 10   --  10  CREATININE 1.18* 0.89 0.76 0.82 0.76  CALCIUM 8.1* 7.8* 7.7*  --  8.0*  PROT 7.2  --   --   --  6.1*  ALBUMIN 3.3*  --   --   --  2.6*  AST 20  --   --   --   18  ALT 13  --   --   --  19  ALKPHOS 63  --   --   --  69  BILITOT 0.6  --   --   --  0.7  GFRNONAA 48* >60 >60 >60 >60  GFRAA 56* >60 >60 >60 >60  ANIONGAP 10 8 6   --  10     Hematology Recent Labs  Lab 04/13/18 0541 04/13/18 2003 04/14/18 0233  WBC 12.2* 11.0* 15.5*  RBC 5.19* 5.10 5.09  HGB 11.2* 10.7* 10.6*  HCT 39.6 39.8 38.2  MCV 76.3* 78.0 75.0*  MCH 21.6* 21.0* 20.8*  MCHC 28.3* 26.9* 27.7*  RDW 20.7* 21.0* 20.8*  PLT 307 324 320    Cardiac Enzymes Recent Labs  Lab 04/11/18 2243  TROPONINI <0.03   No results for input(s): TROPIPOC in the last 168 hours.   BNP Recent Labs  Lab 04/11/18 2242  BNP 27.0     DDimer No results for input(s): DDIMER in the last 168 hours.   Radiology    Dg Chest Port 1 View  Result Date: 04/13/2018 CLINICAL DATA:  Hypoxia.  History of COPD current smoker. EXAM: PORTABLE CHEST 1 VIEW COMPARISON:  Chest x-ray and chest CT scan of April 11, 2018 FINDINGS: The cardiopericardial silhouette remains enlarged. There is increased soft tissue density in the right paratracheal and right hilar regions new since the previous study. There is no pleural effusion. The interstitial markings of both lungs are coarse but stable. The pulmonary vascularity is mildly engorged. IMPRESSION: Increased soft tissue density in the right hilar and right paratracheal region. This may reflect collapse of portions of the right middle and lower lobes. Lymphadenopathy is felt less likely. There is stable enlargement of the cardiac silhouette with mild pulmonary vascular congestion consistent with CHF. Chest CT scanning is recommended if the patient can tolerate the procedure. Electronically Signed   By: David  Martinique M.D.   On: 04/13/2018 09:26    Cardiac Studies      Patient Profile     62 y.o. female with chronic pericardial effusion  Assessment & Plan    1.   Large pericaridal effusion:   - s/p pericardiocentesis on 8/6 - Cytology, gram stain and fluid  analysis c/w transudate.  - CT no malignancy - I reviewed echo personally and small posterior effusion.  - Minimal drainage over past 36 hours. I pulled drain at bedside  without complications.  - Suspect effusion was inflammatory in nature - Will need repeat echo in 4 weeks to reassess  [place] will sign off.   Medication Recommendations:  Continue current meds Other recommendations (labs, testing, etc):  Echo 4 weeks Follow up as an outpatient:  With Dr. Harl Bowie in 4 weeks    For questions or updates, please contact [facility] Please consult [email or web address] for contact info under Cardiology/STEMI.     Glori Bickers, MD  10:58 AM

## 2018-04-15 NOTE — Progress Notes (Signed)
  Echocardiogram 2D Echocardiogram has been performed.  Lisa Ewing 04/15/2018, 2:35 PM

## 2018-04-16 ENCOUNTER — Inpatient Hospital Stay (HOSPITAL_COMMUNITY): Payer: Medicaid Other

## 2018-04-16 DIAGNOSIS — I313 Pericardial effusion (noninflammatory): Secondary | ICD-10-CM

## 2018-04-16 DIAGNOSIS — I10 Essential (primary) hypertension: Secondary | ICD-10-CM

## 2018-04-16 LAB — RENAL FUNCTION PANEL
Albumin: 2.7 g/dL — ABNORMAL LOW (ref 3.5–5.0)
Anion gap: 11 (ref 5–15)
BUN: 18 mg/dL (ref 8–23)
CO2: 29 mmol/L (ref 22–32)
Calcium: 8.7 mg/dL — ABNORMAL LOW (ref 8.9–10.3)
Chloride: 101 mmol/L (ref 98–111)
Creatinine, Ser: 0.83 mg/dL (ref 0.44–1.00)
GFR calc Af Amer: >60 mL/min (ref >60)
GFR calc non Af Amer: >60 mL/min (ref >60)
Glucose, Bld: 202 mg/dL — ABNORMAL HIGH (ref 70–99)
Phosphorus: 3.7 mg/dL (ref 2.5–4.6)
Potassium: 4.3 mmol/L (ref 3.5–5.1)
Sodium: 141 mmol/L (ref 135–145)

## 2018-04-16 LAB — CBC WITH DIFFERENTIAL/PLATELET
Basophils Absolute: 0.2 10*3/uL — ABNORMAL HIGH (ref 0.0–0.1)
Basophils Relative: 1 %
Eosinophils Absolute: 0 10*3/uL (ref 0.0–0.7)
Eosinophils Relative: 0 %
HCT: 40.7 % (ref 36.0–46.0)
Hemoglobin: 11.6 g/dL — ABNORMAL LOW (ref 12.0–15.0)
Lymphocytes Relative: 7 %
Lymphs Abs: 1.2 10*3/uL (ref 0.7–4.0)
MCH: 20.9 pg — ABNORMAL LOW (ref 26.0–34.0)
MCHC: 28.5 g/dL — ABNORMAL LOW (ref 30.0–36.0)
MCV: 73.2 fL — ABNORMAL LOW (ref 78.0–100.0)
Monocytes Absolute: 0.9 10*3/uL (ref 0.1–1.0)
Monocytes Relative: 5 %
Neutro Abs: 15.5 10*3/uL — ABNORMAL HIGH (ref 1.7–7.7)
Neutrophils Relative %: 87 %
Platelets: 365 10*3/uL (ref 150–400)
RBC: 5.56 MIL/uL — ABNORMAL HIGH (ref 3.87–5.11)
RDW: 21.2 % — ABNORMAL HIGH (ref 11.5–15.5)
WBC: 17.8 10*3/uL — ABNORMAL HIGH (ref 4.0–10.5)

## 2018-04-16 LAB — GLUCOSE, CAPILLARY
Glucose-Capillary: 213 mg/dL — ABNORMAL HIGH (ref 70–99)
Glucose-Capillary: 181 mg/dL — ABNORMAL HIGH (ref 70–99)
Glucose-Capillary: 206 mg/dL — ABNORMAL HIGH (ref 70–99)
Glucose-Capillary: 188 mg/dL — ABNORMAL HIGH (ref 70–99)

## 2018-04-16 LAB — ECHOCARDIOGRAM LIMITED
Height: 65 in
Weight: 4761.94 oz

## 2018-04-16 MED ORDER — INSULIN ASPART 100 UNIT/ML ~~LOC~~ SOLN
3.0000 [IU] | Freq: Three times a day (TID) | SUBCUTANEOUS | Status: DC
  Administered 2018-04-16 – 2018-04-19 (×10): 3 [IU] via SUBCUTANEOUS

## 2018-04-16 MED ORDER — LOSARTAN POTASSIUM 50 MG PO TABS
50.0000 mg | ORAL_TABLET | Freq: Every day | ORAL | Status: DC
  Administered 2018-04-16 – 2018-04-19 (×4): 50 mg via ORAL
  Filled 2018-04-16 (×4): qty 1

## 2018-04-16 NOTE — Progress Notes (Addendum)
Progress Note  Patient Name: Lisa Ewing Date of Encounter: 04/16/2018  Primary Cardiologist:  Branch   Subjective   62 yo admitted  / transferred from [facility] with severe dyspnea and large pericardial effusion  The pericardial drainage has slowed.   Drained just 40 cc yesterday  Echo yesterday showed a small pericardial effusion  The echo was technically very difficult and its difficult to say if there is a larger collection of fluid posteriorly  BP is elevated today   Inpatient Medications    Scheduled Meds: . atorvastatin  40 mg Oral q1800  . budesonide (PULMICORT) nebulizer solution  0.5 mg Nebulization BID  . calcitRIOL  0.25 mcg Oral Daily  . chlorhexidine  15 mL Mouth Rinse BID  . ciprofloxacin  500 mg Oral BID  . colchicine  0.6 mg Oral BID  . escitalopram  20 mg Oral Daily  . ezetimibe  10 mg Oral Daily  . fenofibrate  54 mg Oral Daily  . gabapentin  300 mg Oral Q24H  . gabapentin  600 mg Oral BID  . heparin  5,000 Units Subcutaneous Q8H  . insulin aspart  0-9 Units Subcutaneous TID WC  . ipratropium-albuterol  3 mL Nebulization TID  . levothyroxine  137 mcg Oral QAC breakfast  . linagliptin  5 mg Oral Daily  . loratadine  10 mg Oral Daily  . mouth rinse  15 mL Mouth Rinse q12n4p  . metFORMIN  1,000 mg Oral BID WC  . methylPREDNISolone (SOLU-MEDROL) injection  60 mg Intravenous Q6H  . mometasone-formoterol  2 puff Inhalation BID  . montelukast  10 mg Oral QHS  . naLOXone (NARCAN)  injection  0.8 mg Intravenous Once  . pantoprazole  40 mg Oral BID  . sodium chloride flush  10-40 mL Intracatheter Q12H  . sodium chloride flush  3 mL Intravenous Q12H  . sodium chloride flush  3 mL Intravenous Q12H   Continuous Infusions: . sodium chloride    . sodium chloride 10 mL/hr at 04/16/18 0200  . sodium chloride     PRN Meds: sodium chloride, sodium chloride, acetaminophen, albuterol, HYDROcodone-acetaminophen, hyoscyamine, ondansetron **OR** ondansetron  (ZOFRAN) IV, sodium chloride flush, sodium chloride flush, sodium chloride flush   Vital Signs    Vitals:   04/16/18 0600 04/16/18 0700 04/16/18 0806 04/16/18 0816  BP: (!) 163/84 (!) 165/98    Pulse: 90 85    Resp: 16 16    Temp:   97.9 F (36.6 C)   TempSrc:   Oral   SpO2: 95% 94%  96%  Weight:      Height:        Intake/Output Summary (Last 24 hours) at 04/16/2018 0826 Last data filed at 04/16/2018 0630 Gross per 24 hour  Intake 189.11 ml  Output 1140 ml  Net -950.89 ml   Filed Weights   04/12/18 0326 04/14/18 0500 04/15/18 0500  Weight: 135 kg 132.6 kg 135 kg    Telemetry     NSR - Personally Reviewed  ECG     NSR  - Personally Reviewed  Physical Exam   GEN:  morbidly obese female, nad  Neck: No JVD Cardiac: RRR, no murmurs, rubs, or gallops. Pericardial drain in place  Respiratory: Clear to auscultation bilaterally. GI: Soft, nontender, non-distended  MS: No edema; No deformity. Neuro:  Nonfocal  Psych: Normal affect   Labs    Chemistry Recent Labs  Lab 04/11/18 1742  04/13/18 2202 04/13/18 2003 04/14/18 5427 04/16/18 0623  NA 134*   < > 138  --  139 141  K 3.5   < > 4.4  --  3.7 4.3  CL 96*   < > 103  --  98 101  CO2 28   < > 29  --  31 29  GLUCOSE 167*   < > 163*  --  151* 202*  BUN 13   < > 10  --  10 18  CREATININE 1.18*   < > 0.76 0.82 0.76 0.83  CALCIUM 8.1*   < > 7.7*  --  8.0* 8.7*  PROT 7.2  --   --   --  6.1*  --   ALBUMIN 3.3*  --   --   --  2.6* 2.7*  AST 20  --   --   --  18  --   ALT 13  --   --   --  19  --   ALKPHOS 63  --   --   --  69  --   BILITOT 0.6  --   --   --  0.7  --   GFRNONAA 48*   < > >60 >60 >60 >60  GFRAA 56*   < > >60 >60 >60 >60  ANIONGAP 10   < > 6  --  10 11   < > = values in this interval not displayed.     Hematology Recent Labs  Lab 04/13/18 2003 04/14/18 0233 04/16/18 0312  WBC 11.0* 15.5* 17.8*  RBC 5.10 5.09 5.56*  HGB 10.7* 10.6* 11.6*  HCT 39.8 38.2 40.7  MCV 78.0 75.0* 73.2*  MCH  21.0* 20.8* 20.9*  MCHC 26.9* 27.7* 28.5*  RDW 21.0* 20.8* 21.2*  PLT 324 320 365    Cardiac Enzymes Recent Labs  Lab 04/11/18 2243  TROPONINI <0.03   No results for input(s): TROPIPOC in the last 168 hours.   BNP Recent Labs  Lab 04/11/18 2242  BNP 27.0     DDimer No results for input(s): DDIMER in the last 168 hours.   Radiology    No results found.  Cardiac Studies      Patient Profile     62 y.o. female with a chronic pericardial effusion.   S/p pericardiocentesis .   Assessment & Plan    1.  Pericardial effusion:   Drainage from the pigtail catheter has slowed. Will recheck limited echo to make sure the drainage has not reaccumulated.       2.  HTN:   Mild HTN today .   Will start Losartan which will also be beneficial with her DM  3.  Leukocytosis :   WBC has been climbing over past several days Drainage is not purulent. Further work up per IM         For questions or updates, please contact [place] Please consult [email or web address] for contact info under Cardiology/STEMI.      Signed, Mertie Moores, MD  04/16/2018, 8:26 AM

## 2018-04-16 NOTE — Evaluation (Signed)
Physical Therapy Evaluation/Discharge  Patient Details Name: Lisa Ewing MRN: [id number] DOB: 1956/06/17 Today's Date: 04/16/2018   History of Present Illness  62 yo admitted with pericardial effusion s/p pericardiocentesis. PMhx: obese, pericardial effusion, COPD, chronic pain, depression, DM, uterine CA  Clinical Impression  Pt pleasant and sitting in chair upon arrival. Pt able to squat/pivot transfer with supervision for safety and lines. Pt functioning is at baseline and does not require skilled therapy at this time. Pt non-ambulatory for 11 years. Pt agreed with current assessment and no further needs. Encouraged continued mobility with nursing.     Follow Up Recommendations No PT follow up;Supervision for mobility/OOB    Equipment Recommendations  None recommended by PT    Recommendations for Other Services       Precautions / Restrictions Precautions Precautions: Fall Restrictions Weight Bearing Restrictions: No      Mobility  Bed Mobility Overal bed mobility: Modified Independent Bed Mobility: Rolling Rolling: Modified independent (Device/Increase time)         General bed mobility comments: use of rail   Transfers Overall transfer level: Needs assistance   Transfers: Squat Pivot Transfers(chair to commode and back, chair to bed, 4 total transfers )     Squat pivot transfers: Min assist     General transfer comment: First try min a, repeated three tries progressed to min guard with repetition. relies on grabbing handle of commode or chair for pivot and steady   Ambulation/Gait             General Gait Details: pt not ambulatory for 11 years   Stairs            Wheelchair Mobility    Modified Rankin (Stroke Patients Only)       Balance Overall balance assessment: Needs assistance Sitting-balance support: No upper extremity supported Sitting balance-Leahy Scale: Fair     Standing balance support: Bilateral upper extremity  supported Standing balance-Leahy Scale: Poor Standing balance comment: unable to achieve full stand                              Pertinent Vitals/Pain Pain Assessment: No/denies pain    Home Living Family/patient expects to be discharged to:: Private residence Living Arrangements: Children Available Help at Discharge: Family;Personal care attendant Type of Home: House Home Access: Ramped entrance     Home Layout: One level Home Equipment: Wheelchair - power;Bedside commode;Transport chair;Shower seat      Prior Function Level of Independence: Needs assistance   Gait / Transfers Assistance Needed: pt able to pivot bed>WC>chair has not walked for years  ADL's / Homemaking Assistance Needed: aide assists with bathing, dressing and toileting. Aide and family do all homemaking  Comments: aide 6 days a week with daughter on sundays, daughter is unable to physically assist     Hand Dominance        Extremity/Trunk Assessment   Upper Extremity Assessment Upper Extremity Assessment: Overall WFL for tasks assessed    Lower Extremity Assessment Lower Extremity Assessment: RLE deficits/detail RLE Deficits / Details: decreased strength and sensation, L WFL  RLE Sensation: decreased light touch(medial calf and ankle )       Communication   Communication: No difficulties  Cognition Arousal/Alertness: Awake/alert Behavior During Therapy: WFL for tasks assessed/performed Overall Cognitive Status: Within Functional Limits for tasks assessed  General Comments      Exercises     Assessment/Plan    PT Assessment Patent does not need any further PT services  PT Problem List         PT Treatment Interventions      PT Goals (Current goals can be found in the Care Plan section)  Acute Rehab PT Goals PT Goal Formulation: All assessment and education complete, DC therapy    Frequency     Barriers to  discharge        Co-evaluation               AM-PAC PT "6 Clicks" Daily Activity  Outcome Measure Difficulty turning over in bed (including adjusting bedclothes, sheets and blankets)?: A Lot Difficulty moving from lying on back to sitting on the side of the bed? : A Lot Difficulty sitting down on and standing up from a chair with arms (e.g., wheelchair, bedside commode, etc,.)?: A Lot Help needed moving to and from a bed to chair (including a wheelchair)?: A Little Help needed walking in hospital room?: Total Help needed climbing 3-5 steps with a railing? : Total 6 Click Score: 11    End of Session Equipment Utilized During Treatment: Gait belt;Oxygen Activity Tolerance: Patient tolerated treatment well Patient left: in bed;with call bell/phone within reach;with nursing/sitter in room Nurse Communication: Mobility status PT Visit Diagnosis: Unsteadiness on feet (R26.81);Muscle weakness (generalized) (M62.81);Other abnormalities of gait and mobility (R26.89)    Time: 0355-9741 PT Time Calculation (min) (ACUTE ONLY): 23 min   Charges:   PT Evaluation $PT Eval Moderate Complexity: [place], [place]  Acute Rehab [phone]   Samuella Bruin 04/16/2018, 9:48 AM

## 2018-04-16 NOTE — Progress Notes (Signed)
Inpatient Diabetes Program Recommendations  AACE/ADA: New Consensus Statement on Inpatient Glycemic Control (2019)  Target Ranges:  Prepandial:   less than 140 mg/dL      Peak postprandial:   less than 180 mg/dL (1-2 hours)      Critically ill patients:  140 - 180 mg/dL  Results for Lisa Ewing, Lisa Ewing (MRN [id number]) as of 04/16/2018 07:55  Ref. Range 04/15/2018 06:49 04/15/2018 12:33 04/15/2018 16:35 04/15/2018 21:40 04/16/2018 06:29  Glucose-Capillary Latest Ref Range: 70 - 99 mg/dL 179 (H) 200 (H) 248 (H) 160 (H) 213 (H)    Review of Glycemic Control  Diabetes history: DM2 Outpatient Diabetes medications: Januvia 100 mg daily, Metformin 1000 mg BID Current orders for Inpatient glycemic control: Novolog 0-9 units TID with meals, Tradjenta 5 mg daily, Metformin 1000 mg BID; Solumedrol 60 mg Q6H  Inpatient Diabetes Program Recommendations:  Correction (SSI): Please consider ordering Novolog 0-5 units QHS for bedtime correction scale. Insulin - Meal Coverage: If steroids are continued, please consider ordering Novolog 3 units TID with meals for meal coverage if patient eats at least 50% of meals.  Thanks, Barnie Alderman, RN, MSN, CDE Diabetes Coordinator Inpatient Diabetes Program [phone] (Team Pager from 8am to 5pm)

## 2018-04-16 NOTE — Progress Notes (Signed)
PROGRESS NOTE    Lisa Ewing  IDP:[id number] DOB: 1956-09-02 DOA: 04/11/2018  PCP: Orlena Sheldon, PA-C   Brief Narrative:  62 year old female, morbidly obese, with past medical history significant for recurrent pericardial effusion, COPD, tobacco abuse, Barrett's esophagus, left hip bursitis, chronic pain syndrome, depression, type 2 diabetes, history of uterine cancer, hyperlipidemia, hypothyroidism, OSA noncompliant with CPAP, likely undiagnosed OHS. Patient was admitted with symptomatic large pericardial effusion, underwent pericardiocentesis 04/13/2018. Pt found to have UTI, and diarrhea secondary to Salmonella and currently is receiving PO Cipro.    Assessment & Plan:   Pericardial effusion, recurrent - Patient is s/p pericardiocentesis 8/6 - Cardio following - Drain in past 24 hours 20 cc, yellow color - Per cardio, recheck limited ECHO to see if drainage stable   Acute lower UTI due to Salmonella - Urine cultures growing Salmonella  - Likely contaminant from stool  - Continue Cipro 500 mg twice a day  Diarrhea presumed of infectious etiology secondary to Salmonella - Complete course of Cipro   COPD with exacerbation / Acute respiratory failure with hypoxia - Stable respiratory status   GERD - Continue Protonix 40 mg p.o. twice daily  Depression - Continue Lexapro 20 mg daily   Hyperlipidemia - Continue Lipitor 40 mg daily  - Continue Zetia and fenofibrate    Essential hypertension - Continue losartan 50 mg daily   Hypothyroidism - Continue levothyroxine 137 mcg p.o. daily.  Diabetes mellitus type 2, with diabetic neuropathy (HCC) - CBG's: 248, 160, 213 - Add novolog 3 units TIDAC - Continue tradjenta 5 mg daily and metformin 1000 mg 2 times daily  - Continue gabapentin for neuropathy   Sleep apnea - CPAP, non - compliant   Morbid obesity due to excess calories - Counseled on nutrition, diet     DVT prophylaxis: Heparin subQ Code Status:  full code  Family Communication: no family at bedside this am Disposition Plan: home once cleared by cardio    Consultants:   Cardiology, Dr. Mertie Moores  Pulmonary   Gastroenterology   Procedures:   Pericardial drain 8/6 -->  Antimicrobials:   8/4 Levaquin x 1  8/5 acyclovir x 1  Flagyl 8/5 --> 8/6  Cipro 8/6 -->   Subjective: No overnight events.  Objective: Vitals:   04/16/18 0800 04/16/18 0806 04/16/18 0816 04/16/18 0900  BP: (!) 150/88   (!) 163/83  Pulse: 82   87  Resp: 15   12  Temp:  97.9 F (36.6 C)    TempSrc:  Oral    SpO2: 97%  96% 96%  Weight:      Height:        Intake/Output Summary (Last 24 hours) at 04/16/2018 0922 Last data filed at 04/16/2018 0900 Gross per 24 hour  Intake 259.06 ml  Output 1140 ml  Net -880.94 ml   Filed Weights   04/12/18 0326 04/14/18 0500 04/15/18 0500  Weight: 135 kg 132.6 kg 135 kg    Examination:  General exam: Appears calm and comfortable  Respiratory system: Clear to auscultation. No wheezing  Cardiovascular system: S1 & S2 heard, Rate controlled, has pericardial drain Gastrointestinal system: Abdomen is nondistended, soft and nontender. No organomegaly or masses felt. Normal bowel sounds heard. Central nervous system: Alert and oriented. No focal neurological deficits. Extremities: Symmetric 5 x 5 power. Skin: No rashes, lesions or ulcers Psychiatry: Judgement and insight appear normal. Mood & affect appropriate.   Data Reviewed: I have personally reviewed following labs and imaging studies  CBC: Recent Labs  Lab 04/12/18 0500 04/13/18 0541 04/13/18 2003 04/14/18 0233 04/16/18 0312  WBC 12.8* 12.2* 11.0* 15.5* 17.8*  NEUTROABS 10.2*  --   --   --  15.5*  HGB 11.3* 11.2* 10.7* 10.6* 11.6*  HCT 38.7 39.6 39.8 38.2 40.7  MCV 75.0* 76.3* 78.0 75.0* 73.2*  PLT 326 307 324 320 836   Basic Metabolic Panel: Recent Labs  Lab 04/11/18 1742 04/12/18 0500 04/13/18 0541 04/13/18 2003  04/14/18 0233 04/16/18 0312  NA 134* 138 138  --  139 141  K 3.5 4.0 4.4  --  3.7 4.3  CL 96* 101 103  --  98 101  CO2 28 29 29   --  31 29  GLUCOSE 167* 148* 163*  --  151* 202*  BUN 13 12 10   --  10 18  CREATININE 1.18* 0.89 0.76 0.82 0.76 0.83  CALCIUM 8.1* 7.8* 7.7*  --  8.0* 8.7*  MG  --  1.6*  --   --   --   --   PHOS  --  4.1  --   --   --  3.7   GFR: Estimated Creatinine Clearance: 97.9 mL/min (by C-G formula based on SCr of 0.83 mg/dL). Liver Function Tests: Recent Labs  Lab 04/11/18 1742 04/14/18 0233 04/16/18 0312  AST 20 18  --   ALT 13 19  --   ALKPHOS 63 69  --   BILITOT 0.6 0.7  --   PROT 7.2 6.1*  --   ALBUMIN 3.3* 2.6* 2.7*   Recent Labs  Lab 04/11/18 1742  LIPASE 23   No results for input(s): AMMONIA in the last 168 hours. Coagulation Profile: No results for input(s): INR, PROTIME in the last 168 hours. Cardiac Enzymes: Recent Labs  Lab 04/11/18 2243  TROPONINI <0.03   BNP (last 3 results) No results for input(s): PROBNP in the last 8760 hours. HbA1C: No results for input(s): HGBA1C in the last 72 hours. CBG: Recent Labs  Lab 04/15/18 0649 04/15/18 1233 04/15/18 1635 04/15/18 2140 04/16/18 0629  GLUCAP 179* 200* 248* 160* 213*   Lipid Profile: No results for input(s): CHOL, HDL, LDLCALC, TRIG, CHOLHDL, LDLDIRECT in the last 72 hours. Thyroid Function Tests: Recent Labs    04/13/18 2003  TSH 0.788  FREET4 1.03   Anemia Panel: No results for input(s): VITAMINB12, FOLATE, FERRITIN, TIBC, IRON, RETICCTPCT in the last 72 hours. Urine analysis:    Component Value Date/Time   COLORURINE AMBER (A) 04/12/2018 1235   APPEARANCEUR CLOUDY (A) 04/12/2018 1235   LABSPEC >1.046 (H) 04/12/2018 1235   PHURINE 5.0 04/12/2018 1235   GLUCOSEU NEGATIVE 04/12/2018 1235   HGBUR MODERATE (A) 04/12/2018 1235   BILIRUBINUR NEGATIVE 04/12/2018 1235   KETONESUR NEGATIVE 04/12/2018 1235   PROTEINUR 100 (A) 04/12/2018 1235   UROBILINOGEN 0.2  03/14/2015 1026   NITRITE POSITIVE (A) 04/12/2018 1235   LEUKOCYTESUR LARGE (A) 04/12/2018 1235   Sepsis Labs: @LABRCNTIP (procalcitonin:4,lacticidven:4)    Culture, Urine     Status: Abnormal (Preliminary result)   Collection Time: 04/12/18 12:35 PM  Result Value Ref Range Status   Specimen Description   Final    NONE   Culture (A)  Final    20,000 COLONIES/mL SALMONELLA SPECIES       Susceptibility   Salmonella species - MIC*    AMPICILLIN <=2 SENSITIVE Sensitive     LEVOFLOXACIN <=0.12 SENSITIVE Sensitive     TRIMETH/SULFA <=20 SENSITIVE Sensitive     *  20,000 COLONIES/mL SALMONELLA SPECIES  Gastrointestinal Panel by PCR , Stool     Status: Abnormal   Collection Time: 04/12/18  9:00 PM  Result Value Ref Range Status   Campylobacter species NOT DETECTED NOT DETECTED Final   Plesimonas shigelloides NOT DETECTED NOT DETECTED Final   Salmonella species DETECTED (A) NOT DETECTED Final   Yersinia enterocolitica NOT DETECTED NOT DETECTED Final   Vibrio species NOT DETECTED NOT DETECTED Final   Vibrio cholerae NOT DETECTED NOT DETECTED Final   Enteroaggregative E coli (EAEC) NOT DETECTED NOT DETECTED Final   Enteropathogenic E coli (EPEC) NOT DETECTED NOT DETECTED Final   Enterotoxigenic E coli (ETEC) NOT DETECTED NOT DETECTED Final   Shiga like toxin producing E coli (STEC) NOT DETECTED NOT DETECTED Final   Shigella/Enteroinvasive E coli (EIEC) NOT DETECTED NOT DETECTED Final   Cryptosporidium NOT DETECTED NOT DETECTED Final   Cyclospora cayetanensis NOT DETECTED NOT DETECTED Final   Entamoeba histolytica NOT DETECTED NOT DETECTED Final   Giardia lamblia NOT DETECTED NOT DETECTED Final   Adenovirus F40/41 NOT DETECTED NOT DETECTED Final   Astrovirus NOT DETECTED NOT DETECTED Final   Norovirus GI/GII NOT DETECTED NOT DETECTED Final   Rotavirus A NOT DETECTED NOT DETECTED Final   Sapovirus (I, II, IV, and V) NOT DETECTED NOT DETECTED Final    Comment: Performed at [facility], [address] [place],  [address]  MRSA PCR Screening     Status: None   Collection Time: 04/13/18  1:51 PM  Result Value Ref Range Status   MRSA by PCR NEGATIVE NEGATIVE Final  Culture, body fluid-bottle     Status: None (Preliminary result)   Collection Time: 04/13/18  4:50 PM  Result Value Ref Range Status   Specimen Description FLUID PERITONEAL [facility]  Final   Special Requests NONE  Final   Culture   Final    NO GROWTH 2 DAYS    Report Status PENDING  Incomplete  Gram stain     Status: None   Collection Time: 04/13/18  4:50 PM  Result Value Ref Range Status   Specimen Description FLUID PERITONEAL  Final   Special Requests NONE  Final   Gram Stain   Final    NO WBC SEEN NO ORGANISMS SEEN   Report Status 04/13/2018 FINAL  Final  Culture, blood (routine x 2)     Status: None (Preliminary result)   Collection Time: 04/13/18  6:56 PM  Result Value Ref Range Status   Specimen Description BLOOD RIGHT ARM  Final   Special Requests   Final    BOTTLES DRAWN AEROBIC AND ANAEROBIC Blood Culture adequate volume   Culture   Final    NO GROWTH 2 DAYS    Report Status PENDING  Incomplete  Culture, blood (routine x 2)     Status: None (Preliminary result)   Collection Time: 04/13/18  6:56 PM  Result Value Ref Range Status   Specimen Description BLOOD RIGHT ANTECUBITAL  Final   Special Requests   Final    BOTTLES DRAWN AEROBIC AND ANAEROBIC Blood Culture adequate volume   Culture   Final    NO GROWTH 2 DAYS    Report Status PENDING  Incomplete  MRSA PCR Screening     Status: None   Collection Time: 04/13/18  7:18 PM  Result Value Ref Range Status   MRSA by PCR NEGATIVE NEGATIVE Final  C difficile quick scan w PCR reflex     Status: None  Collection Time: 04/13/18  8:41 PM  Result Value Ref Range Status   C Diff antigen NEGATIVE NEGATIVE Final   C Diff toxin NEGATIVE NEGATIVE Final   C Diff interpretation No C. difficile detected.  Final      Radiology  Studies: Ct Abdomen Pelvis Wo Contrast Result Date: 04/12/2018 1. Circumferential mural edema and thickening with pericolonic edema noted in the ascending, transverse, and descending colon. Imaging features compatible with infectious/inflammatory colitis. 2. Mild to moderate pericardial effusion appears similar to yesterday's chest CT.   Dg Chest Port 1 View Result Date: 04/13/2018 Increased soft tissue density in the right hilar and right paratracheal region. This may reflect collapse of portions of the right middle and lower lobes. Lymphadenopathy is felt less likely. There is stable enlargement of the cardiac silhouette with mild pulmonary vascular congestion consistent with CHF. Chest CT scanning is recommended if the patient can tolerate the procedure. Electronically Signed   By: David  Martinique M.D.   On: 04/13/2018 09:26       Scheduled Meds: . atorvastatin  40 mg Oral q1800  . budesonide   0.5 mg Nebulization BID  . calcitRIOL  0.25 mcg Oral Daily  . chlorhexidine  15 mL Mouth Rinse BID  . ciprofloxacin  500 mg Oral BID  . colchicine  0.6 mg Oral BID  . escitalopram  20 mg Oral Daily  . ezetimibe  10 mg Oral Daily  . fenofibrate  54 mg Oral Daily  . gabapentin  300 mg Oral Q24H  . gabapentin  600 mg Oral BID  . heparin  5,000 Units Subcutaneous Q8H  . insulin aspart  0-9 Units Subcutaneous TID WC  . ipratropium-albutero  3 mL Nebulization TID  . levothyroxine  137 mcg Oral QAC breakfast  . linagliptin  5 mg Oral Daily  . loratadine  10 mg Oral Daily  . losartan  50 mg Oral Daily  . metFORMIN  1,000 mg Oral BID WC  . methylPREDNISolo  60 mg Intravenous Q6H  . mometasone-formo  2 puff Inhalation BID  . montelukast  10 mg Oral QHS  . naLOXone (NARCAN)  injection  0.8 mg Intravenous Once  . pantoprazole  40 mg Oral BID   Continuous Infusions: . sodium chloride    . sodium chloride 10 mL/hr at 04/16/18 0900  . sodium chloride       LOS: 3 days    Time spent: 25  minutes Greater than 50% of the time spent on counseling and coordinating the care.   Leisa Lenz, MD [facility] Pager [phone]  If 7PM-7AM, please contact night-coverage [email or web address] Password [email or web address] 04/16/2018, 9:22 AM

## 2018-04-16 NOTE — Progress Notes (Signed)
  Echocardiogram 2D Echocardiogram has been performed.  Lisa Ewing 04/16/2018, 2:50 PM

## 2018-04-17 LAB — GLUCOSE, CAPILLARY
GLUCOSE-CAPILLARY: 213 mg/dL — AB (ref 70–99)
GLUCOSE-CAPILLARY: 180 mg/dL — AB (ref 70–99)
Glucose-Capillary: 179 mg/dL — ABNORMAL HIGH (ref 70–99)
Glucose-Capillary: 184 mg/dL — ABNORMAL HIGH (ref 70–99)
Glucose-Capillary: 186 mg/dL — ABNORMAL HIGH (ref 70–99)

## 2018-04-17 MED ORDER — IPRATROPIUM-ALBUTEROL 0.5-2.5 (3) MG/3ML IN SOLN
3.0000 mL | Freq: Two times a day (BID) | RESPIRATORY_TRACT | Status: DC
  Administered 2018-04-17 – 2018-04-19 (×4): 3 mL via RESPIRATORY_TRACT
  Filled 2018-04-17 (×4): qty 3

## 2018-04-17 NOTE — Progress Notes (Signed)
Progress Note  Patient Name: Lisa Ewing Date of Encounter: 04/15/2018  Primary Cardiologist: No primary care provider on file.   Subjective   62 yo admitted / transferred from [facility] with severe dyspnea and large pericardial effusion. Dr. Irish Lack drained 550 cc at the time of the pericardiocentesis . Has drained 130 cc last night and now has 20-30 cc in the accordian drain . she is feeling better. Breathing better. No cp   The pericardial drain contnes to drain > 100 cc per shift.   Total of 370 cc yesterday     Inpatient Medications    Scheduled Meds: . atorvastatin  40 mg Oral q1800  . budesonide (PULMICORT) nebulizer solution  0.5 mg Nebulization BID  . calcitRIOL  0.25 mcg Oral Daily  . chlorhexidine  15 mL Mouth Rinse BID  . ciprofloxacin  500 mg Oral BID  . colchicine  0.6 mg Oral BID  . escitalopram  20 mg Oral Daily  . ezetimibe  10 mg Oral Daily  . fenofibrate  54 mg Oral Daily  . gabapentin  300 mg Oral Q24H  . gabapentin  600 mg Oral BID  . heparin  5,000 Units Subcutaneous Q8H  . insulin aspart  0-9 Units Subcutaneous TID WC  . ipratropium-albuterol  3 mL Nebulization TID  . [START ON 04/16/2018] levothyroxine  137 mcg Oral QAC breakfast  . linagliptin  5 mg Oral Daily  . loratadine  10 mg Oral Daily  . mouth rinse  15 mL Mouth Rinse q12n4p  . metFORMIN  1,000 mg Oral BID WC  . methylPREDNISolone (SOLU-MEDROL) injection  60 mg Intravenous Q6H  . mometasone-formoterol  2 puff Inhalation BID  . montelukast  10 mg Oral QHS  . naLOXone (NARCAN)  injection  0.8 mg Intravenous Once  . pantoprazole  40 mg Oral BID  . sodium chloride flush  10-40 mL Intracatheter Q12H  . sodium chloride flush  3 mL Intravenous Q12H  . sodium chloride flush  3 mL Intravenous Q12H   Continuous Infusions: . sodium chloride    . sodium chloride 10 mL/hr at 04/15/18 0700  . sodium chloride     PRN Meds: sodium chloride, sodium chloride, acetaminophen,  albuterol, HYDROcodone-acetaminophen, hyoscyamine, ondansetron **OR** ondansetron (ZOFRAN) IV, sodium chloride flush, sodium chloride flush, sodium chloride flush   Vital Signs          Vitals:   04/15/18 0500 04/15/18 0600 04/15/18 0700 04/15/18 0731  BP: 139/79 131/78 (!) 145/80   Pulse: 92 97 96   Resp: 13 (!) 24 12   Temp:    98 F (36.7 C)  TempSrc:    Oral  SpO2: 98% 98% 96%   Weight: 135 kg     Height:        Intake/Output Summary (Last 24 hours) at 04/15/2018 0847 Last data filed at 04/15/2018 0700    Gross per 24 hour  Intake 649.56 ml  Output 1270 ml  Net -620.44 ml        Filed Weights   04/12/18 0326 04/14/18 0500 04/15/18 0500  Weight: 135 kg 132.6 kg 135 kg    Telemetry    NSR  - Personally Reviewed  ECG     NSR  - Personally Reviewed  Physical Exam   DZH:GDJMEQ age, morbidly obese   Neck:No JVD Cardiac:RRR, no murmurs, rubs, or gallops.  Pericardial drain in place  Respiratory:Clear to auscultation bilaterally. AS:TMHD, nontender, non-distended  MS:No edema; No deformity. Neuro:Nonfocal  Psych:  Normal affect   Labs    Chemistry LastLabs         Recent Labs  Lab 04/11/18 1742 04/12/18 0500 04/13/18 0541 04/13/18 2003 04/14/18 0233  NA 134* 138 138  --  139  K 3.5 4.0 4.4  --  3.7  CL 96* 101 103  --  98  CO2 28 29 29   --  31  GLUCOSE 167* 148* 163*  --  151*  BUN 13 12 10   --  10  CREATININE 1.18* 0.89 0.76 0.82 0.76  CALCIUM 8.1* 7.8* 7.7*  --  8.0*  PROT 7.2  --   --   --  6.1*  ALBUMIN 3.3*  --   --   --  2.6*  AST 20  --   --   --  18  ALT 13  --   --   --  19  ALKPHOS 63  --   --   --  69  BILITOT 0.6  --   --   --  0.7  GFRNONAA 48* >60 >60 >60 >60  GFRAA 56* >60 >60 >60 >60  ANIONGAP 10 8 6   --  10       Hematology LastLabs       Recent Labs  Lab 04/13/18 0541 04/13/18 2003 04/14/18 0233  WBC 12.2* 11.0* 15.5*  RBC 5.19* 5.10 5.09  HGB 11.2* 10.7* 10.6*  HCT 39.6  39.8 38.2  MCV 76.3* 78.0 75.0*  MCH 21.6* 21.0* 20.8*  MCHC 28.3* 26.9* 27.7*  RDW 20.7* 21.0* 20.8*  PLT 307 324 320      Cardiac Enzymes LastLabs     Recent Labs  Lab 04/11/18 2243  TROPONINI <0.03      LastLabs  No results for input(s): TROPIPOC in the last 168 hours.     BNP LastLabs     Recent Labs  Lab 04/11/18 2242  BNP 27.0       DDimer  LastLabs  No results for input(s): DDIMER in the last 168 hours.     Radiology     ImagingResults(Last48hours)  Dg Chest Port 1 View  Result Date: 04/13/2018 CLINICAL DATA:  Hypoxia.  History of COPD current smoker. EXAM: PORTABLE CHEST 1 VIEW COMPARISON:  Chest x-ray and chest CT scan of April 11, 2018 FINDINGS: The cardiopericardial silhouette remains enlarged. There is increased soft tissue density in the right paratracheal and right hilar regions new since the previous study. There is no pleural effusion. The interstitial markings of both lungs are coarse but stable. The pulmonary vascularity is mildly engorged. IMPRESSION: Increased soft tissue density in the right hilar and right paratracheal region. This may reflect collapse of portions of the right middle and lower lobes. Lymphadenopathy is felt less likely. There is stable enlargement of the cardiac silhouette with mild pulmonary vascular congestion consistent with CHF. Chest CT scanning is recommended if the patient can tolerate the procedure. Electronically Signed   By: David  Martinique M.D.   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   pericaridal effusion:   The pericardial drain continues to drain quite a bit.  It drained 370 cc yesterday.  Still is too much to pull the pericardial drain.  I called cytology for the report and they will call me back later today.  I discussed the case with  Dr. Servando Snare.  We may need to consider a pericardial  window either tomorrow or perhaps on Saturday.  I started her on colchicine yesterday but this does not seem to be helping to any significant degree.  She remains very comfortable.       For questions or updates, please contact [place] Please consult [email or web address] for contact info under Cardiology/STEMI.      Signed, Mertie Moores, MD  04/15/2018, 8:47 AM

## 2018-04-18 ENCOUNTER — Inpatient Hospital Stay (HOSPITAL_COMMUNITY): Payer: Medicaid Other

## 2018-04-18 DIAGNOSIS — I313 Pericardial effusion (noninflammatory): Secondary | ICD-10-CM

## 2018-04-18 LAB — CBC
HCT: 44.6 % (ref 36.0–46.0)
Hemoglobin: 12.7 g/dL (ref 12.0–15.0)
MCH: 20.7 pg — AB (ref 26.0–34.0)
MCHC: 28.5 g/dL — AB (ref 30.0–36.0)
MCV: 72.5 fL — AB (ref 78.0–100.0)
PLATELETS: 421 10*3/uL — AB (ref 150–400)
RBC: 6.15 MIL/uL — ABNORMAL HIGH (ref 3.87–5.11)
RDW: 21.3 % — AB (ref 11.5–15.5)
WBC: 23.4 10*3/uL — AB (ref 4.0–10.5)

## 2018-04-18 LAB — GLUCOSE, CAPILLARY
GLUCOSE-CAPILLARY: 228 mg/dL — AB (ref 70–99)
GLUCOSE-CAPILLARY: 106 mg/dL — AB (ref 70–99)
Glucose-Capillary: 185 mg/dL — ABNORMAL HIGH (ref 70–99)
Glucose-Capillary: 164 mg/dL — ABNORMAL HIGH (ref 70–99)

## 2018-04-18 LAB — BASIC METABOLIC PANEL
Anion gap: 15 (ref 5–15)
BUN: 17 mg/dL (ref 8–23)
CO2: 27 mmol/L (ref 22–32)
CREATININE: 0.95 mg/dL (ref 0.44–1.00)
Calcium: 8.9 mg/dL (ref 8.9–10.3)
Chloride: 95 mmol/L — ABNORMAL LOW (ref 98–111)
GFR calc Af Amer: >60 mL/min (ref >60)
GFR calc non Af Amer: >60 mL/min (ref >60)
GLUCOSE: 188 mg/dL — AB (ref 70–99)
Potassium: 4.3 mmol/L (ref 3.5–5.1)
SODIUM: 137 mmol/L (ref 135–145)

## 2018-04-18 LAB — CULTURE, BLOOD (ROUTINE X 2)
Culture: NO GROWTH
Culture: NO GROWTH
SPECIAL REQUESTS: ADEQUATE
Special Requests: ADEQUATE

## 2018-04-18 LAB — CULTURE, BODY FLUID W GRAM STAIN -BOTTLE: Culture: NO GROWTH

## 2018-04-18 LAB — ECHOCARDIOGRAM LIMITED
Height: 65 in
Weight: 4776.05 oz

## 2018-04-18 LAB — CULTURE, BODY FLUID-BOTTLE

## 2018-04-18 MED ORDER — HYDROCODONE-ACETAMINOPHEN 5-325 MG PO TABS
1.0000 | ORAL_TABLET | Freq: Four times a day (QID) | ORAL | Status: DC | PRN
  Administered 2018-04-18 – 2018-04-19 (×3): 2 via ORAL
  Filled 2018-04-18 (×3): qty 2

## 2018-04-18 NOTE — Progress Notes (Signed)
2D Echocardiogram has been performed.  Lisa Ewing 04/18/2018, 8:58 AM

## 2018-04-18 NOTE — Progress Notes (Signed)
Patient ID: Lisa Ewing, female   DOB: March 07, 1956, 62 y.o.   MRN: [id number]   Progress Note  Patient Name: Lisa Ewing Date of Encounter: 04/15/2018  Primary Cardiologist: No primary care provider on file.   Subjective   62 yo admitted / transferred from [facility] with severe dyspnea and large pericardial effusion. Dr. Irish Lack drained 550 cc at the time of the pericardiocentesis .  Pericardial drain removed 8/10.   No complaints this morning, no chest pain.   Cytology from pericardial fluid negative for malignant cells.     Inpatient Medications    Scheduled Meds: . atorvastatin  40 mg Oral q1800  . budesonide (PULMICORT) nebulizer solution  0.5 mg Nebulization BID  . calcitRIOL  0.25 mcg Oral Daily  . chlorhexidine  15 mL Mouth Rinse BID  . ciprofloxacin  500 mg Oral BID  . colchicine  0.6 mg Oral BID  . escitalopram  20 mg Oral Daily  . ezetimibe  10 mg Oral Daily  . fenofibrate  54 mg Oral Daily  . gabapentin  300 mg Oral Q24H  . gabapentin  600 mg Oral BID  . heparin  5,000 Units Subcutaneous Q8H  . insulin aspart  0-9 Units Subcutaneous TID WC  . ipratropium-albuterol  3 mL Nebulization TID  . [START ON 04/16/2018] levothyroxine  137 mcg Oral QAC breakfast  . linagliptin  5 mg Oral Daily  . loratadine  10 mg Oral Daily  . mouth rinse  15 mL Mouth Rinse q12n4p  . metFORMIN  1,000 mg Oral BID WC  . methylPREDNISolone (SOLU-MEDROL) injection  60 mg Intravenous Q6H  . mometasone-formoterol  2 puff Inhalation BID  . montelukast  10 mg Oral QHS  . naLOXone (NARCAN)  injection  0.8 mg Intravenous Once  . pantoprazole  40 mg Oral BID  . sodium chloride flush  10-40 mL Intracatheter Q12H  . sodium chloride flush  3 mL Intravenous Q12H  . sodium chloride flush  3 mL Intravenous Q12H   Continuous Infusions: . sodium chloride    . sodium chloride 10 mL/hr at 04/15/18 0700  . sodium chloride     PRN Meds: sodium chloride, sodium chloride, acetaminophen,  albuterol, HYDROcodone-acetaminophen, hyoscyamine, ondansetron **OR** ondansetron (ZOFRAN) IV, sodium chloride flush, sodium chloride flush, sodium chloride flush   Vital Signs          Vitals:   04/15/18 0500 04/15/18 0600 04/15/18 0700 04/15/18 0731  BP: 139/79 131/78 (!) 145/80   Pulse: 92 97 96   Resp: 13 (!) 24 12   Temp:    98 F (36.7 C)  TempSrc:    Oral  SpO2: 98% 98% 96%   Weight: 135 kg     Height:        Intake/Output Summary (Last 24 hours) at 04/15/2018 0847 Last data filed at 04/15/2018 0700    Gross per 24 hour  Intake 649.56 ml  Output 1270 ml  Net -620.44 ml        Filed Weights   04/12/18 0326 04/14/18 0500 04/15/18 0500  Weight: 135 kg 132.6 kg 135 kg    Telemetry    NSR  - Personally Reviewed  ECG     NSR  - Personally Reviewed  Physical Exam   General: NAD, obese Neck: No JVD, no thyromegaly or thyroid nodule.  Lungs: Clear to auscultation bilaterally with normal respiratory effort. CV: Nondisplaced PMI.  Heart regular S1/S2, no S3/S4, no murmur.  No peripheral edema.  Abdomen: Soft, nontender, no hepatosplenomegaly, no distention.  Skin: Intact without lesions or rashes.  Neurologic: Alert and oriented x 3.  Psych: Normal affect. Extremities: No clubbing or cyanosis.  HEENT: Normal.   Labs    Chemistry LastLabs         Recent Labs  Lab 04/11/18 1742 04/12/18 0500 04/13/18 0541 04/13/18 2003 04/14/18 0233  NA 134* 138 138  --  139  K 3.5 4.0 4.4  --  3.7  CL 96* 101 103  --  98  CO2 28 29 29   --  31  GLUCOSE 167* 148* 163*  --  151*  BUN 13 12 10   --  10  CREATININE 1.18* 0.89 0.76 0.82 0.76  CALCIUM 8.1* 7.8* 7.7*  --  8.0*  PROT 7.2  --   --   --  6.1*  ALBUMIN 3.3*  --   --   --  2.6*  AST 20  --   --   --  18  ALT 13  --   --   --  19  ALKPHOS 63  --   --   --  69  BILITOT 0.6  --   --   --  0.7  GFRNONAA 48* >60 >60 >60 >60  GFRAA 56* >60 >60 >60 >60  ANIONGAP 10 8 6   --  10         Hematology LastLabs       Recent Labs  Lab 04/13/18 0541 04/13/18 2003 04/14/18 0233  WBC 12.2* 11.0* 15.5*  RBC 5.19* 5.10 5.09  HGB 11.2* 10.7* 10.6*  HCT 39.6 39.8 38.2  MCV 76.3* 78.0 75.0*  MCH 21.6* 21.0* 20.8*  MCHC 28.3* 26.9* 27.7*  RDW 20.7* 21.0* 20.8*  PLT 307 324 320      Cardiac Enzymes LastLabs     Recent Labs  Lab 04/11/18 2243  TROPONINI <0.03      LastLabs  No results for input(s): TROPIPOC in the last 168 hours.     BNP LastLabs     Recent Labs  Lab 04/11/18 2242  BNP 27.0       DDimer  LastLabs  No results for input(s): DDIMER in the last 168 hours.     Radiology     ImagingResults(Last48hours)  Dg Chest Port 1 View  Result Date: 04/13/2018 CLINICAL DATA:  Hypoxia.  History of COPD current smoker. EXAM: PORTABLE CHEST 1 VIEW COMPARISON:  Chest x-ray and chest CT scan of April 11, 2018 FINDINGS: The cardiopericardial silhouette remains enlarged. There is increased soft tissue density in the right paratracheal and right hilar regions new since the previous study. There is no pleural effusion. The interstitial markings of both lungs are coarse but stable. The pulmonary vascularity is mildly engorged. IMPRESSION: Increased soft tissue density in the right hilar and right paratracheal region. This may reflect collapse of portions of the right middle and lower lobes. Lymphadenopathy is felt less likely. There is stable enlargement of the cardiac silhouette with mild pulmonary vascular congestion consistent with CHF. Chest CT scanning is recommended if the patient can tolerate the procedure. Electronically Signed   By: David  Martinique M.D.   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   Pericardial effusion:  This has been chronic.  Now s/p pericardiocentesis.  Pericardial drain removed yesterday.  -  Repeat limited  echo tomorrow for re-accumulation.  - If re-accumulates in future, favor pericardial window.  2. Smoking: Strongly encouraged her to quit.  3. HTN: Continue home meds.    Transfer to step-down.   For questions or updates, please contact [place] Please consult [email or web address] for contact info under Cardiology/STEMI.   Loralie Champagne 04/18/2018 8:13 AM

## 2018-04-18 NOTE — Progress Notes (Signed)
Pt tx via wheelchair to room 2c05. Pt able to stand and pivot to bed. Pt provided with diet sprite, remote, callbell and oriented to room. Pt denies needs at this time. Pt placed on monitor, CCMD notified.

## 2018-04-18 NOTE — Progress Notes (Signed)
PROGRESS NOTE  DAVELYN GWINN ZOX:[id number] DOB: Jun 30, 1956 DOA: 04/11/2018 PCP: Orlena Sheldon, PA-C  HPI/Recap of past 24 hours:  Feeling better, denies chest pain, no sob, no fever  Assessment/Plan: Principal Problem:   Pericardial effusion Active Problems:   GERD   Diarrhea   COPD (chronic obstructive pulmonary disease) ([place])   Depression   Hyperlipidemia   Hypothyroid   Smoker   Diabetes mellitus type 2, uncomplicated (HCC)   Sleep apnea   Acute lower UTI   Abdominal pain   Acute gastroenteritis   Intractable vomiting with nausea   Obesity, Class III, BMI 40-49.9 (morbid obesity) ([place])   Dehydration   Acute respiratory failure with hypoxia and hypercapnia (HCC)   Acute metabolic encephalopathy   Nausea vomiting and diarrhea   Pericardial tamponade   Pericardial effusion, recurrent - Patient is s/p pericardiocentesis 8/6, drain removed. - recheck limited ECHO to see if drainage stable  -will follow cardiology recommendation. Steroids and colchicine management per cardiology  Acute lower UTI due to Salmonella - Urine cultures growing Salmonella  - Likely contaminant from stool  - Continue Cipro 500 mg twice a day  Diarrhea presumed of infectious etiology secondary to Salmonella -ct ab/pel "Circumferential mural edema and thickening with pericolonic edema noted in the ascending, transverse, and descending colon. Imaging features compatible with infectious/inflammatory colitis." - Complete course of Cipro   COPD exacerbation with acute on chronic hypoxic and hypercarbic respiratory failure/ acute metabolic encephalopathy presented on admission Initial abg ph 7.2 pco2 71 She required bipap initially She has improved, she is now back to baseline 2liter oxygen, encephalopathy has resolved  noninsulin dependent Diabetes mellitus type 2, with diabetic neuropathy (HCC) - CBG's: 248, 160, 213 - Add novolog 3 units TIDAC - Continue tradjenta 5 mg daily and  metformin 1000 mg 2 times daily  - Continue gabapentin for neuropathy    COPD with exacerbation / Acute respiratory failure with hypoxia - Stable respiratory status   Hyperlipidemia - Continue Lipitor 40 mg daily  - Continue Zetia and fenofibrate    Essential hypertension - Continue losartan 50 mg daily   GERD - Continue Protonix 40 mg p.o. twice daily  Depression - Continue Lexapro 20 mg daily    Hypothyroidism - Continue levothyroxine 137 mcg p.o. daily.   Morbid obesity: Body mass index is 49.67 kg/m. Sleep apnea - CPAP, non - compliant   Tobacco abuse: Smoking cessation education provided     Code Status: full  Family Communication: patient   Disposition Plan: transfer out of stepdown to med tele Start PT eval Needs cardiology clearance to discharge   Consultants:  Cardiology  Pulmonary /critical care  Gastroenterology    Procedures:  Pericardial drain 8/6 --> 8/10  Antibiotics:  8/4 Levaquin x 1  8/5 acyclovir x 1  Flagyl 8/5 --> 8/6  Cipro 8/6 -->   Objective: BP (!) 168/81   Pulse 72   Temp 98 F (36.7 C) (Oral)   Resp 16   Ht 5\' 5"  (1.651 m)   Wt 135.4 kg   LMP  (LMP Unknown)   SpO2 96%   BMI 49.67 kg/m   Intake/Output Summary (Last 24 hours) at 04/18/2018 0725 Last data filed at 04/18/2018 0500 Gross per 24 hour  Intake 480 ml  Output 500 ml  Net -20 ml   Filed Weights   04/14/18 0500 04/15/18 0500 04/17/18 0600  Weight: 132.6 kg 135 kg 135.4 kg    Exam: Patient is examined daily including  today on 04/18/2018, exams remain the same as of yesterday except that has changed    General:  NAD, obese  Cardiovascular: RRR  Respiratory: CTABL  Abdomen: Soft/ND/NT, positive BS  Musculoskeletal: No Edema  Neuro: alert, oriented   Data Reviewed: Basic Metabolic Panel: Recent Labs  Lab 04/11/18 1742 04/12/18 0500 04/13/18 0541 04/13/18 2003 04/14/18 0233 04/16/18 0312  NA 134* 138 138  --  139  141  K 3.5 4.0 4.4  --  3.7 4.3  CL 96* 101 103  --  98 101  CO2 28 29 29   --  31 29  GLUCOSE 167* 148* 163*  --  151* 202*  BUN 13 12 10   --  10 18  CREATININE 1.18* 0.89 0.76 0.82 0.76 0.83  CALCIUM 8.1* 7.8* 7.7*  --  8.0* 8.7*  MG  --  1.6*  --   --   --   --   PHOS  --  4.1  --   --   --  3.7   Liver Function Tests: Recent Labs  Lab 04/11/18 1742 04/14/18 0233 04/16/18 0312  AST 20 18  --   ALT 13 19  --   ALKPHOS 63 69  --   BILITOT 0.6 0.7  --   PROT 7.2 6.1*  --   ALBUMIN 3.3* 2.6* 2.7*   Recent Labs  Lab 04/11/18 1742  LIPASE 23   No results for input(s): AMMONIA in the last 168 hours. CBC: Recent Labs  Lab 04/12/18 0500 04/13/18 0541 04/13/18 2003 04/14/18 0233 04/16/18 0312  WBC 12.8* 12.2* 11.0* 15.5* 17.8*  NEUTROABS 10.2*  --   --   --  15.5*  HGB 11.3* 11.2* 10.7* 10.6* 11.6*  HCT 38.7 39.6 39.8 38.2 40.7  MCV 75.0* 76.3* 78.0 75.0* 73.2*  PLT 326 307 324 320 365   Cardiac Enzymes:   Recent Labs  Lab 04/11/18 2243  TROPONINI <0.03   BNP (last 3 results) Recent Labs    04/11/18 2242  BNP 27.0    ProBNP (last 3 results) No results for input(s): PROBNP in the last 8760 hours.  CBG: Recent Labs  Lab 04/17/18 0618 04/17/18 1133 04/17/18 1632 04/17/18 2204 04/18/18 0638  GLUCAP 184* 213* 186* 180* 185*    Recent Results (from the past 240 hour(s))  Culture, Urine     Status: Abnormal (Preliminary result)   Collection Time: 04/12/18 12:35 PM  Result Value Ref Range Status   Specimen Description   Final    URINE, RANDOM Performed at [facility], [address], [place], [place] [address]    Special Requests   Final    NONE Performed at [facility], [address], [place], [place] [address]    Culture (A)  Final    20,000 COLONIES/mL [place] NOTIFIED Referred to [facility] in [place], [place] for Serotyping. Performed at [facility], [place] [address]  [place], [place] [address]    Report Status PENDING  Incomplete   Organism ID, Bacteria SALMONELLA SPECIES (A)  Final      Susceptibility   Salmonella species - MIC*    AMPICILLIN <=2 SENSITIVE Sensitive     LEVOFLOXACIN <=0.12 SENSITIVE Sensitive     TRIMETH/SULFA <=20 SENSITIVE Sensitive     * 20,000 COLONIES/mL SALMONELLA SPECIES  Gastrointestinal Panel by PCR , Stool     Status: Abnormal   Collection Time: 04/12/18  9:00 PM  Result Value Ref Range Status  Campylobacter species NOT DETECTED NOT DETECTED Final   Plesimonas shigelloides NOT DETECTED NOT DETECTED Final   Salmonella species DETECTED (A) NOT DETECTED Final    Comment: RESULT CALLED TO, READ BACK BY AND VERIFIED WITH: CHELSEA MOTLEY 04/13/18 1634 KLW    Yersinia enterocolitica NOT DETECTED NOT DETECTED Final   Vibrio species NOT DETECTED NOT DETECTED Final   Vibrio cholerae NOT DETECTED NOT DETECTED Final   Enteroaggregative E coli (EAEC) NOT DETECTED NOT DETECTED Final   Enteropathogenic E coli (EPEC) NOT DETECTED NOT DETECTED Final   Enterotoxigenic E coli (ETEC) NOT DETECTED NOT DETECTED Final   Shiga like toxin producing E coli (STEC) NOT DETECTED NOT DETECTED Final   Shigella/Enteroinvasive E coli (EIEC) NOT DETECTED NOT DETECTED Final   Cryptosporidium NOT DETECTED NOT DETECTED Final   Cyclospora cayetanensis NOT DETECTED NOT DETECTED Final   Entamoeba histolytica NOT DETECTED NOT DETECTED Final   Giardia lamblia NOT DETECTED NOT DETECTED Final   Adenovirus F40/41 NOT DETECTED NOT DETECTED Final   Astrovirus NOT DETECTED NOT DETECTED Final   Norovirus GI/GII NOT DETECTED NOT DETECTED Final   Rotavirus A NOT DETECTED NOT DETECTED Final   Sapovirus (I, II, IV, and V) NOT DETECTED NOT DETECTED Final    Comment: Performed at [facility], [address] [place], [place] [address]  MRSA PCR Screening     Status: None   Collection Time: 04/13/18  1:51 PM  Result Value Ref Range Status   MRSA by PCR NEGATIVE  NEGATIVE Final    Comment:        The GeneXpert MRSA Assay (FDA approved for NASAL specimens only), is one component of a comprehensive MRSA colonization surveillance program. It is not intended to diagnose MRSA infection nor to guide or monitor treatment for MRSA infections. Performed at [facility], [address] [place], [place] [address]   Culture, body fluid-bottle     Status: None (Preliminary result)   Collection Time: 04/13/18  4:50 PM  Result Value Ref Range Status   Specimen Description FLUID PERITONEAL [facility]  Final   Special Requests NONE  Final   Culture   Final    NO GROWTH 4 DAYS Performed at [facility] [address], [place], [place] [address]    Report Status PENDING  Incomplete  Gram stain     Status: None   Collection Time: 04/13/18  4:50 PM  Result Value Ref Range Status   Specimen Description FLUID PERITONEAL  Final   Special Requests NONE  Final   Gram Stain   Final    NO WBC SEEN NO ORGANISMS SEEN Performed at [facility], [address] [place], [place] [address]    Report Status 04/13/2018 FINAL  Final  Culture, blood (routine x 2)     Status: None (Preliminary result)   Collection Time: 04/13/18  6:56 PM  Result Value Ref Range Status   Specimen Description BLOOD RIGHT ARM  Final   Special Requests   Final    BOTTLES DRAWN AEROBIC AND ANAEROBIC Blood Culture adequate volume   Culture   Final    NO GROWTH 4 DAYS Performed at [facility], [place] [address] [place], [place] [address]    Report Status PENDING  Incomplete  Culture, blood (routine x 2)     Status: None (Preliminary result)   Collection Time: 04/13/18  6:56 PM  Result Value Ref Range Status   Specimen Description BLOOD RIGHT ANTECUBITAL  Final  Special Requests   Final    BOTTLES DRAWN AEROBIC AND ANAEROBIC Blood Culture adequate volume   Culture   Final    NO GROWTH 4 DAYS Performed at [facility], [place] [address], [place], [place] [address]     Report Status PENDING  Incomplete  MRSA PCR Screening     Status: None   Collection Time: 04/13/18  7:18 PM  Result Value Ref Range Status   MRSA by PCR NEGATIVE NEGATIVE Final    Comment:        The GeneXpert MRSA Assay (FDA approved for NASAL specimens only), is one component of a comprehensive MRSA colonization surveillance program. It is not intended to diagnose MRSA infection nor to guide or monitor treatment for MRSA infections. Performed at [facility], [place] [address], [place], [place] [address]   C difficile quick scan w PCR reflex     Status: None   Collection Time: 04/13/18  8:41 PM  Result Value Ref Range Status   C Diff antigen NEGATIVE NEGATIVE Final   C Diff toxin NEGATIVE NEGATIVE Final   C Diff interpretation No C. difficile detected.  Final    Comment: Performed at [facility], [place] [address] [place], [place] [address]     Studies: No results found.  Scheduled Meds: . atorvastatin  40 mg Oral q1800  . budesonide (PULMICORT) nebulizer solution  0.5 mg Nebulization BID  . calcitRIOL  0.25 mcg Oral Daily  . chlorhexidine  15 mL Mouth Rinse BID  . ciprofloxacin  500 mg Oral BID  . colchicine  0.6 mg Oral BID  . escitalopram  20 mg Oral Daily  . ezetimibe  10 mg Oral Daily  . fenofibrate  54 mg Oral Daily  . gabapentin  300 mg Oral Q24H  . gabapentin  600 mg Oral BID  . heparin  5,000 Units Subcutaneous Q8H  . insulin aspart  0-9 Units Subcutaneous TID WC  . insulin aspart  3 Units Subcutaneous TID WC  . ipratropium-albuterol  3 mL Nebulization BID  . levothyroxine  137 mcg Oral QAC breakfast  . linagliptin  5 mg Oral Daily  . loratadine  10 mg Oral Daily  . losartan  50 mg Oral Daily  . mouth rinse  15 mL Mouth Rinse q12n4p  . metFORMIN  1,000 mg Oral BID WC  . methylPREDNISolone (SOLU-MEDROL) injection  60 mg Intravenous Q6H  . mometasone-formoterol  2 puff Inhalation BID  . montelukast  10 mg Oral QHS  . naLOXone (NARCAN)  injection   0.8 mg Intravenous Once  . pantoprazole  40 mg Oral BID  . sodium chloride flush  10-40 mL Intracatheter Q12H  . sodium chloride flush  3 mL Intravenous Q12H  . sodium chloride flush  3 mL Intravenous Q12H    Continuous Infusions: . sodium chloride    . sodium chloride Stopped (04/16/18 1153)  . sodium chloride       Time spent: 35 mins I have personally reviewed and interpreted on  04/18/2018 daily labs, tele strips, imagings as discussed above under date review session and assessment and plans.  I reviewed all nursing notes, pharmacy notes, consultant notes,  vitals, pertinent old records  I have discussed plan of care as described above with RN , patient on 04/18/2018   Florencia Reasons MD, PhD  [facility] Pager [phone]. If 7PM-7AM, please contact night-coverage at [email or web address], password [email or web address] 04/18/2018, 7:25 AM  LOS: 5 days

## 2018-04-19 ENCOUNTER — Ambulatory Visit: Payer: Medicaid Other

## 2018-04-19 ENCOUNTER — Telehealth: Payer: Self-pay | Admitting: Cardiology

## 2018-04-19 ENCOUNTER — Ambulatory Visit: Payer: Medicaid Other | Admitting: Physical Medicine & Rehabilitation

## 2018-04-19 LAB — CBC WITH DIFFERENTIAL/PLATELET
BASOS ABS: 0 10*3/uL (ref 0.0–0.1)
Basophils Relative: 0 %
EOS ABS: 0 10*3/uL (ref 0.0–0.7)
Eosinophils Relative: 0 %
HCT: 41 % (ref 36.0–46.0)
Hemoglobin: 12.1 g/dL (ref 12.0–15.0)
LYMPHS ABS: 1.3 10*3/uL (ref 0.7–4.0)
LYMPHS PCT: 6 %
MCH: 21.5 pg — ABNORMAL LOW (ref 26.0–34.0)
MCHC: 29.5 g/dL — AB (ref 30.0–36.0)
MCV: 72.7 fL — ABNORMAL LOW (ref 78.0–100.0)
MONO ABS: 1.1 10*3/uL — AB (ref 0.1–1.0)
Monocytes Relative: 5 %
Neutro Abs: 19.9 10*3/uL — ABNORMAL HIGH (ref 1.7–7.7)
Neutrophils Relative %: 89 %
PLATELETS: 329 10*3/uL (ref 150–400)
RBC: 5.64 MIL/uL — ABNORMAL HIGH (ref 3.87–5.11)
RDW: 20.9 % — ABNORMAL HIGH (ref 11.5–15.5)
WBC: 22.3 10*3/uL — ABNORMAL HIGH (ref 4.0–10.5)

## 2018-04-19 LAB — GLUCOSE, CAPILLARY
Glucose-Capillary: 205 mg/dL — ABNORMAL HIGH (ref 70–99)
Glucose-Capillary: 210 mg/dL — ABNORMAL HIGH (ref 70–99)

## 2018-04-19 LAB — BASIC METABOLIC PANEL
Anion gap: 14 (ref 5–15)
BUN: 20 mg/dL (ref 8–23)
CO2: 24 mmol/L (ref 22–32)
CREATININE: 0.79 mg/dL (ref 0.44–1.00)
Calcium: 8.5 mg/dL — ABNORMAL LOW (ref 8.9–10.3)
Chloride: 99 mmol/L (ref 98–111)
GFR calc Af Amer: >60 mL/min (ref >60)
GLUCOSE: 192 mg/dL — AB (ref 70–99)
Potassium: 4.3 mmol/L (ref 3.5–5.1)
SODIUM: 137 mmol/L (ref 135–145)

## 2018-04-19 LAB — MAGNESIUM: MAGNESIUM: 1.6 mg/dL — AB (ref 1.7–2.4)

## 2018-04-19 MED ORDER — MAGNESIUM SULFATE 2 GM/50ML IV SOLN
2.0000 g | Freq: Once | INTRAVENOUS | Status: AC
  Administered 2018-04-19: 2 g via INTRAVENOUS
  Filled 2018-04-19: qty 50

## 2018-04-19 MED ORDER — COLCHICINE 0.6 MG PO CAPS: 0.6000 mg | ORAL_CAPSULE | Freq: Two times a day (BID) | ORAL | 0 refills | Status: DC

## 2018-04-19 MED ORDER — INSULIN ASPART 100 UNIT/ML FLEXPEN: PEN_INJECTOR | SUBCUTANEOUS | 0 refills | Status: DC

## 2018-04-19 MED ORDER — PREDNISONE 10 MG PO TABS: ORAL_TABLET | ORAL | 0 refills | Status: DC

## 2018-04-19 MED ORDER — LOSARTAN POTASSIUM 50 MG PO TABS: 50.0000 mg | ORAL_TABLET | Freq: Every day | ORAL | 0 refills | Status: DC

## 2018-04-19 NOTE — Progress Notes (Addendum)
DAILY PROGRESS NOTE   Patient Name: Lisa Ewing Date of Encounter: 04/19/2018  Chief Complaint   Feels great today, wants to go home  Patient Profile   Lisa Ewing is a 62 y.o. female with a hx of chronic pericardial effusion who is being seen today for the evaluation of pericaridal effusion at the request of Dr Tat.  Subjective   Looks to have recovered from large pericardial effusion. Limited echo yesterday shows stable minimal effusion without significant recurrence yesterday.  Objective   Vitals:   04/19/18 0400 04/19/18 0715 04/19/18 0850 04/19/18 0854  BP:  (!) 176/77    Pulse: (!) 102 71    Resp:  19    Temp:  97.9 F (36.6 C)    TempSrc:  Oral    SpO2: 91% 92% 92% 99%  Weight:      Height:        Intake/Output Summary (Last 24 hours) at 04/19/2018 0856 Last data filed at 04/19/2018 0400 Gross per 24 hour  Intake 960 ml  Output 1075 ml  Net -115 ml   Filed Weights   04/14/18 0500 04/15/18 0500 04/17/18 0600  Weight: 132.6 kg 135 kg 135.4 kg    Physical Exam   General appearance: alert, no distress and morbidly obese Lungs: clear to auscultation bilaterally Heart: regular rate and rhythm, S1, S2 normal, no murmur, click, rub or gallop Extremities: extremities normal, atraumatic, no cyanosis or edema Neurologic: Grossly normal  Inpatient Medications    Scheduled Meds: . atorvastatin  40 mg Oral q1800  . budesonide (PULMICORT) nebulizer solution  0.5 mg Nebulization BID  . calcitRIOL  0.25 mcg Oral Daily  . chlorhexidine  15 mL Mouth Rinse BID  . colchicine  0.6 mg Oral BID  . escitalopram  20 mg Oral Daily  . ezetimibe  10 mg Oral Daily  . fenofibrate  54 mg Oral Daily  . gabapentin  300 mg Oral Q24H  . gabapentin  600 mg Oral BID  . heparin  5,000 Units Subcutaneous Q8H  . insulin aspart  0-9 Units Subcutaneous TID WC  . insulin aspart  3 Units Subcutaneous TID WC  . ipratropium-albuterol  3 mL Nebulization BID  . levothyroxine  137  mcg Oral QAC breakfast  . linagliptin  5 mg Oral Daily  . loratadine  10 mg Oral Daily  . losartan  50 mg Oral Daily  . mouth rinse  15 mL Mouth Rinse q12n4p  . metFORMIN  1,000 mg Oral BID WC  . methylPREDNISolone (SOLU-MEDROL) injection  60 mg Intravenous Q6H  . mometasone-formoterol  2 puff Inhalation BID  . montelukast  10 mg Oral QHS  . naLOXone (NARCAN)  injection  0.8 mg Intravenous Once  . pantoprazole  40 mg Oral BID  . sodium chloride flush  10-40 mL Intracatheter Q12H  . sodium chloride flush  3 mL Intravenous Q12H    Continuous Infusions: . sodium chloride      PRN Meds: sodium chloride, acetaminophen, albuterol, HYDROcodone-acetaminophen, hyoscyamine, ondansetron **OR** ondansetron (ZOFRAN) IV, sodium chloride flush, sodium chloride flush   Labs   Results for orders placed or performed during the hospital encounter of 04/11/18 (from the past 48 hour(s))  Glucose, capillary     Status: Abnormal   Collection Time: 04/17/18 11:33 AM  Result Value Ref Range   Glucose-Capillary 213 (H) 70 - 99 mg/dL   Comment 1 Capillary Specimen   Glucose, capillary     Status: Abnormal  Collection Time: 04/17/18  4:32 PM  Result Value Ref Range   Glucose-Capillary 186 (H) 70 - 99 mg/dL   Comment 1 Capillary Specimen   Glucose, capillary     Status: Abnormal   Collection Time: 04/17/18 10:04 PM  Result Value Ref Range   Glucose-Capillary 180 (H) 70 - 99 mg/dL   Comment 1 Notify RN   Glucose, capillary     Status: Abnormal   Collection Time: 04/18/18  6:38 AM  Result Value Ref Range   Glucose-Capillary 185 (H) 70 - 99 mg/dL   Comment 1 Notify RN   Basic metabolic panel     Status: Abnormal   Collection Time: 04/18/18  8:30 AM  Result Value Ref Range   Sodium 137 135 - 145 mmol/L   Potassium 4.3 3.5 - 5.1 mmol/L   Chloride 95 (L) 98 - 111 mmol/L   CO2 27 22 - 32 mmol/L   Glucose, Bld 188 (H) 70 - 99 mg/dL   BUN 17 8 - 23 mg/dL   Creatinine, Ser 0.95 0.44 - 1.00 mg/dL    Calcium 8.9 8.9 - 10.3 mg/dL   GFR calc non Af Amer >60 >60 mL/min   GFR calc Af Amer >60 >60 mL/min    Comment: (NOTE) The eGFR has been calculated using the CKD EPI equation. This calculation has not been validated in all clinical situations. eGFR's persistently <60 mL/min signify possible Chronic Kidney Disease.    Anion gap 15 5 - 15    Comment: Performed at [facility] [address] [place], [place] [address]  CBC     Status: Abnormal   Collection Time: 04/18/18  8:30 AM  Result Value Ref Range   WBC 23.4 (H) 4.0 - 10.5 K/uL   RBC 6.15 (H) 3.87 - 5.11 MIL/uL   Hemoglobin 12.7 12.0 - 15.0 g/dL   HCT 44.6 36.0 - 46.0 %   MCV 72.5 (L) 78.0 - 100.0 fL   MCH 20.7 (L) 26.0 - 34.0 pg   MCHC 28.5 (L) 30.0 - 36.0 g/dL   RDW 21.3 (H) 11.5 - 15.5 %   Platelets 421 (H) 150 - 400 K/uL    Comment: Performed at [facility], [place] [address], [place], [place] [address]  Glucose, capillary     Status: Abnormal   Collection Time: 04/18/18 11:32 AM  Result Value Ref Range   Glucose-Capillary 164 (H) 70 - 99 mg/dL   Comment 1 Capillary Specimen   Glucose, capillary     Status: Abnormal   Collection Time: 04/18/18  4:27 PM  Result Value Ref Range   Glucose-Capillary 228 (H) 70 - 99 mg/dL  Glucose, capillary     Status: Abnormal   Collection Time: 04/18/18 10:19 PM  Result Value Ref Range   Glucose-Capillary 106 (H) 70 - 99 mg/dL   Comment 1 Notify RN   CBC with Differential/Platelet     Status: Abnormal   Collection Time: 04/19/18  1:54 AM  Result Value Ref Range   WBC 22.3 (H) 4.0 - 10.5 K/uL   RBC 5.64 (H) 3.87 - 5.11 MIL/uL   Hemoglobin 12.1 12.0 - 15.0 g/dL   HCT 41.0 36.0 - 46.0 %   MCV 72.7 (L) 78.0 - 100.0 fL   MCH 21.5 (L) 26.0 - 34.0 pg   MCHC 29.5 (L) 30.0 - 36.0 g/dL   RDW 20.9 (H) 11.5 - 15.5 %   Platelets 329 150 - 400 K/uL   Neutrophils Relative %  89 %   Lymphocytes Relative 6 %   Monocytes Relative 5 %   Eosinophils Relative 0 %   Basophils Relative  0 %   Neutro Abs 19.9 (H) 1.7 - 7.7 K/uL   Lymphs Abs 1.3 0.7 - 4.0 K/uL   Monocytes Absolute 1.1 (H) 0.1 - 1.0 K/uL   Eosinophils Absolute 0.0 0.0 - 0.7 K/uL   Basophils Absolute 0.0 0.0 - 0.1 K/uL   RBC Morphology POLYCHROMASIA PRESENT    WBC Morphology MILD LEFT SHIFT (1-5% METAS, OCC MYELO, OCC BANDS)     Comment: Performed at [facility] [address], [place], [place] [address]  Basic metabolic panel     Status: Abnormal   Collection Time: 04/19/18  1:54 AM  Result Value Ref Range   Sodium 137 135 - 145 mmol/L   Potassium 4.3 3.5 - 5.1 mmol/L   Chloride 99 98 - 111 mmol/L   CO2 24 22 - 32 mmol/L   Glucose, Bld 192 (H) 70 - 99 mg/dL   BUN 20 8 - 23 mg/dL   Creatinine, Ser 0.79 0.44 - 1.00 mg/dL   Calcium 8.5 (L) 8.9 - 10.3 mg/dL   GFR calc non Af Amer >60 >60 mL/min   GFR calc Af Amer >60 >60 mL/min    Comment: (NOTE) The eGFR has been calculated using the CKD EPI equation. This calculation has not been validated in all clinical situations. eGFR's persistently <60 mL/min signify possible Chronic Kidney Disease.    Anion gap 14 5 - 15    Comment: Performed at [facility] [address], [place], [place] [address]  Magnesium     Status: Abnormal   Collection Time: 04/19/18  1:54 AM  Result Value Ref Range   Magnesium 1.6 (L) 1.7 - 2.4 mg/dL    Comment: Performed at [facility] [address] [place], [place] [address]  Glucose, capillary     Status: Abnormal   Collection Time: 04/19/18  7:48 AM  Result Value Ref Range   Glucose-Capillary 205 (H) 70 - 99 mg/dL    ECG   Pending  Telemetry   Sinus rhythm - Personally Reviewed  Radiology    No results found.  Cardiac Studies   Indications:      Pericardial effusion 423.9.  ------------------------------------------------------------------- History:   PMH:  Cardiac tamponade, pericardial effusion.  Chronic obstructive pulmonary disease.  Risk factors:  Current tobacco use. Diabetes  mellitus. Dyslipidemia.  ------------------------------------------------------------------- Study Conclusions  - Pericardium, extracardiac: No clear pericardial effusion. Small   mildly echodense space adjacent to RV probable fat pad, cannot   exclude very small amount of pericardial fluid. The pericardial   space adjacent to the RA is poorly visualized, this was the area   from the 04/16/18 study with lingering effusion. Very questionable   small fluid pocket on apical views, from the also limited   subcostal views there is no clear effusion remaining. - Limited echo to evaluate pericardial effusion.  Assessment   1. Principal Problem: 2.   Pericardial effusion 3. Active Problems: 4.   GERD 5.   Diarrhea 6.   COPD (chronic obstructive pulmonary disease) ([place]) 7.   Depression 8.   Hyperlipidemia 9.   Hypothyroid 10.   Smoker 11.   Diabetes mellitus type 2, uncomplicated ([place]) 12.   Sleep apnea 13.   Acute lower UTI 14.   Abdominal pain 15.   Acute gastroenteritis 16.   Intractable vomiting with nausea  17.   Obesity, Class III, BMI 40-49.9 (morbid obesity) ([place]) 18.   Dehydration 19.   Acute respiratory failure with hypoxia and hypercapnia (HCC) 20.   Acute metabolic encephalopathy 21.   Nausea vomiting and diarrhea 22.   Pericardial tamponade 23.   Plan   1. No recurrent pericardial effusion based on limited echo yesterday. Agree with continuing colchicine for 3 months, as data suggests this can decrease the risk of recurrent effusion which can be as high as 40%. Would taper prednisone from 60 mg daily over 2 weeks. Ok for d/c home today from a cardiac standpoint. Follow-up with Dr. Harl Bowie in [place] in 2 weeks.  Time Spent Directly with Patient:  I have spent a total of 25 minutes with the patient reviewing hospital notes, telemetry, EKGs, labs and examining the patient as well as establishing an assessment and plan that was discussed personally with the patient.  >  50% of time was spent in direct patient care.  Length of Stay:  LOS: 6 days   Pixie Casino, MD, [facility], [place] Director of the Advanced Lipid Disorders &  Cardiovascular Risk Reduction Clinic Diplomate of the American Board of Clinical Lipidology Attending Cardiologist  Direct Dial: [phone]  Fax: [phone]  Website:  [email or web address] [email or web address] Tyrion Glaude 04/19/2018, 8:56 AM

## 2018-04-19 NOTE — Discharge Instructions (Signed)
Insulin Injection Instructions, Using Insulin Pens, Adult A subcutaneous injection is a shot of medicine that is injected into the layer of fat between skin and muscle. People with type 1 diabetes must take insulin because their bodies do not make it. People with type 2 diabetes may need to take insulin. There are many different types of insulin. The type of insulin that you take may determine how many injections you give yourself and when you need to take the injections. Choosing a site for injection Insulin absorption varies from site to site. It is best to inject insulin within the same body area, using a different spot in that area for each injection. Do not inject the insulin in the same spot for each injection. There are five main areas that can be used for injecting. These areas include:  Abdomen. This is the preferred area.  Front of thigh.  Upper, outer side of thigh.  Back of upper arm.  Buttocks.  Using an insulin pen First, follow the steps for Getting Ready, then continue with the steps for Injecting the Insulin. Getting Ready 1. Wash your hands with soap and water. If soap and water are not available, use hand sanitizer. 2. Check the expiration date and type of insulin in the pen. 3. If you are using CLEAR insulin, check to see that it is clear and free of clumps. 4. If you are using CLOUDY insulin, gently roll the pen between your palms several times, or tip the pen up and down several times to mix up the medicine. Do not shake the pen. 5. Remove the cap from the insulin pen. 6. Use an alcohol wipe to clean the rubber stopper of the pen cartridge. 7. Remove the protective paper tab from the disposable needle. Do not let the needle touch anything. 8. Screw the needle onto the pen. 9. Remove the outer and inner plastic covers from the needle. Do not throw away the outer plastic cover yet. 10. Prime the insulin pen by turning the button (dial) to 2 units. Hold the pen with the  needle pointing up, and push the button on the opposite end of the pen until a drop of insulin appears at the needle tip. If no insulin appears, repeat this step. 11. Dial the number of units of insulin that you will be injecting. Injecting the Insulin  1. Use an alcohol wipe to clean the site where you will be injecting the needle. Let the site air-dry. 2. Hold the pen in the palm of your writing hand with your thumb on the top. 3. If directed by your health care provider, use your other hand to pinch and hold about an inch of skin at the injection site. Do not directly touch the cleaned part of the skin. 4. Gently but quickly, put the needle straight into the skin. The needle should be at a 90-degree angle (perpendicular) to the skin, as if to form the letter "L." ? For example, if you are giving an injection in the abdomen, the abdomen forms one "leg" of the "L" and the needle forms the other "leg" of the "L." 5. For adults who have a small amount of body fat, the needle may need to be injected at a 45-degree angle instead. Your health care provider will tell you if this is necessary. ? A 45-degree angle looks like the letter "V." 6. When the needle is completely inserted into the skin, use the thumb of your writing hand to push the top   button of the pen down all the way to inject the insulin. 7. Let go of the skin that you are pinching. Continue to hold the pen in place with your writing hand. 8. Wait five seconds, then pull the needle straight out of the skin. 9. Carefully put the larger (outer) plastic cover of the needle back over the needle, then unscrew the capped needle and discard it in a sharps container, such as an empty plastic bottle with a cover. 10. Put the plastic cap back on the insulin pen. Throwing away supplies  Discard all used needles in a puncture-proof sharps disposal container. You can ask your local pharmacy about where you can get this kind of disposal container, or you  can use an empty liquid laundry detergent bottle that has a cover.  Follow the disposal regulations for the area where you live. Do not use any needle more than one time.  Throw away empty disposable pens in the regular trash. What questions should I ask my health care provider?  How often should I be taking insulin?  How often should I check my blood glucose?  What amount of insulin should I be taking at each time?  What are the side effects?  What should I do if my blood glucose is too high?  What should I do if my blood glucose is too low?  What should I do if I forget to take my insulin?  What number should I call if I have questions? Where can I get more information?  American Diabetes Association (ADA): www.diabetes.org  American Association of Diabetes Educators (AADE) Patient Resources: https://www.diabeteseducator.org/patient-resources This information is not intended to replace advice given to you by your health care provider. Make sure you discuss any questions you have with your health care provider. Document Released: 09/28/2015 Document Revised: 01/31/2016 Document Reviewed: 09/28/2015 [employer] Interactive Patient Education  2018 [employer]  

## 2018-04-19 NOTE — Discharge Summary (Addendum)
Discharge Summary  Lisa Ewing:[id number] DOB: Mar 14, 1956  PCP: Orlena Sheldon, PA-C  Admit date: 04/11/2018 Discharge date: 04/19/2018  Time spent: 4mins, more than 50% time spent on coordination of care  Recommendations for Outpatient Follow-up:  1. F/u with PMD within a week  for hospital discharge follow up, repeat cbc/bmp at follow up 2. F/u with cardiology in two weeks,  3. Resume pcs service/ home health RN  Discharge Diagnoses:  Active Hospital Problems   Diagnosis Date Noted  . Pericardial effusion 04/12/2018  . Acute respiratory failure with hypoxia and hypercapnia ([place]) 04/13/2018  . Acute metabolic encephalopathy 15/40/0867  . Nausea vomiting and diarrhea   . Pericardial tamponade   . Sleep apnea 04/12/2018  . Acute lower UTI 04/12/2018  . Abdominal pain 04/12/2018  . Acute gastroenteritis 04/12/2018  . Intractable vomiting with nausea 04/12/2018  . Obesity, Class III, BMI 40-49.9 (morbid obesity) ([place]) 04/12/2018  . Dehydration 04/12/2018  . Diabetes mellitus type 2, uncomplicated ([place]) 61/95/0932  . Hypothyroid 07/11/2013  . Hyperlipidemia   . Depression   . COPD (chronic obstructive pulmonary disease) ([place])   . Smoker   . Diarrhea 03/02/2013  . GERD 11/27/2009    Resolved Hospital Problems  No resolved problems to display.    Discharge Condition: stable  Diet recommendation: heart healthy/carb modified  Filed Weights   04/14/18 0500 04/15/18 0500 04/17/18 0600  Weight: 132.6 kg 135 kg 135.4 kg    History of present illness: (per admitting MD Dr Olevia Bowens) PCP: Orlena Sheldon, PA-C  Patient coming from: Home.  I have personally briefly reviewed patient's old medical records in [place]  Chief Complaint: Abdominal pain, nausea and vomiting.  HPI: Lisa Ewing is a 62 y.o. female with medical history significant of abnormal involuntary movements, allergic rhinitis, asthma, COPD, Barrett's esophagus, left hip bursitis, chronic pain  syndrome, depression, type 2 diabetes, GERD, history of uterine cancer, hyperlipidemia, hypothyroidism, chronic back pain, osteoporosis, sciatica, shingles, sleep apnea, vitamin D deficiency who is coming to the emergency department due to abdominal pain since Thursday associated with nausea, 3-4 episodes of emesis and 3-4 episodes of diarrheal bowel movements daily since then.  She also complains of dysuria and frequency.  She is not sure if she had a fever or not.  She denies sore throat, hemoptysis, chest pain, palpitations, dizziness, diaphoresis, PND orthopnea.  She occasionally gets pitting edema lower extremities.  No melena or hematochezia.  Denies heat or cold intolerance.  Denies polyuria, polydipsia, polyphagia or blurred vision.  No pruritus or skin rashes.  ED Course: Initial vital signs temperature 98.2 F, pulse 101, respirations 20, blood pressure 141/66 mmHg and O2 sat 95% on room air.She received 1000 mL of normal saline bolus, Zofran 4 mg IVP x1, Toradol 30 mg IVP and a DuoNeb in the emergency department.  Her urinalysis show moderate hemoglobinuria, proteinuria 100 mg/dL, positive nitrites, large leukocyte esterase, 21-50 RBC and more than 50 WBC per hpf with rare bacteria.  White count was 13.1, hemoglobin 11.5 g/dL and platelets 324.  Sodium 134, potassium 3.5, chloride 96 and CO2 28 mmol/L.  Troponin level was normal.  BUN 13, creatinine 1.81, calcium 8.1 and glucose 167 mg/dL.  Total protein 7.2 and albumin 3.3 g/dL.  The rest of the LFTs are within normal limits.  Lipase was normal at 23 units/L.  Her chest radiograph shows stable cardiomegaly.  CTA of chest did not show any acute PE or active cardiopulmonary, but again redemonstrated  moderate to large pleural effusion (please see echo done on 03/09/2018).  Please see images and full radiology report for further detail.   Hospital Course:  Principal Problem:   Pericardial effusion Active Problems:   GERD   Diarrhea   COPD  (chronic obstructive pulmonary disease) (HCC)   Depression   Hyperlipidemia   Hypothyroid   Smoker   Diabetes mellitus type 2, uncomplicated (HCC)   Sleep apnea   Acute lower UTI   Abdominal pain   Acute gastroenteritis   Intractable vomiting with nausea   Obesity, Class III, BMI 40-49.9 (morbid obesity) (HCC)   Dehydration   Acute respiratory failure with hypoxia and hypercapnia (HCC)   Acute metabolic encephalopathy   Nausea vomiting and diarrhea   Pericardial tamponade   Pericardial effusion, recurrent -Patientis s/ppericardiocentesis8/6, drain removed. - recheck limited ECHO showed stable, no reaccumulation of pericardial effusion at discharge Cardiology recommended discharge on colchicine and prednisone taper, follow up with cardiology in two weeks   Acute lower UTIdue to Salmonella -Urine cultures growing Salmonella - Likely contaminant from stool  - Continue Cipro 500 mg twice a day  Diarrheapresumed of infectious etiologysecondary to Salmonella -ct ab/pel "Circumferential mural edema and thickening with pericolonic edema noted in the ascending, transverse, and descending colon. Imaging features compatible with infectious/inflammatory colitis." - Completed course of Ciproin the hospital  COPD exacerbation with acute on chronic hypoxic and hypercarbic respiratory failure/ acute metabolic encephalopathy presented on admission Initial abg ph 7.2 pco2 71 She required bipap initially She has improved, she is now back to baseline 2liter oxygen, encephalopathy has resolved  noninsulin dependent Diabetes mellitus type 2,with diabetic neuropathy(HCC) - CBG's: 248, 160, 213 - - Continue tradjenta 5 mg daily and metformin 1000 mg 2 times daily  - Continue gabapentin for neuropathy D/c actos ( sided effect heart failure) -short term novolog SSI prescribed while she is on prednisone taper.   COPD with exacerbation/ Acute respiratory failure with  hypoxia - Stable respiratory status  Hyperlipidemia - Continue Lipitor 40 mg daily  - Continue Zetia and fenofibrate   Essential hypertension - Continue losartan 50 mg daily  GERD - ContinueProtonix 40 mg p.o. twice daily  Depression - Continue Lexapro 20 mg daily   Hypothyroidism -Continue levothyroxine 137 mcg p.o. daily.   Morbid obesity: Body mass index is 49.67 kg/m. Sleep apnea - CPAP, non - compliant  Tobacco abuse: Smoking cessation education provided    baseline wheelchair bound for 63yrs She has pcs service at home She lives with her daughter  Code Status: full  Family Communication: patient   Disposition Plan: She is to discharge home with cardiology clearance    Consultants:  Cardiology  Pulmonary/critical care  Gastroenterology   Procedures:  Pericardial drain 8/6 --> 8/10  Antibiotics:  8/4 Levaquin x 1  8/5 acyclovir x 1  Flagyl8/5 -->8/6  Cipro 8/6 --> 8/11   Discharge Exam: BP (!) 137/59 (BP Location: Left Wrist)   Pulse 88   Temp 97.8 F (36.6 C) (Oral)   Resp 20   Ht 5\' 5"  (1.651 m)   Wt 135.4 kg   LMP  (LMP Unknown)   SpO2 95%   BMI 49.67 kg/m    General:  NAD, obese  Cardiovascular: RRR  Respiratory: CTABL  Abdomen: Soft/ND/NT, positive BS  Musculoskeletal: No Edema  Neuro: alert, oriented   Discharge Instructions You were cared for by a hospitalist during your hospital stay. If you have any questions about your discharge medications or the care  you received while you were in the hospital after you are discharged, you can call the unit and asked to speak with the hospitalist on call if the hospitalist that took care of you is not available. Once you are discharged, your primary care physician will handle any further medical issues. Please note that NO REFILLS for any discharge medications will be authorized once you are discharged, as it is imperative that you return to  your primary care physician (or establish a relationship with a primary care physician if you do not have one) for your aftercare needs so that they can reassess your need for medications and monitor your lab values.  Discharge Instructions    Diet - low sodium heart healthy   Complete by:  As directed    Carb modified diet   Increase activity slowly   Complete by:  As directed      Allergies as of 04/19/2018      Reactions   Bee Venom Anaphylaxis   Latex Shortness Of Breath   Oxycodone-acetaminophen Hives, Shortness Of Breath   Upset Stomach   Penicillins Anaphylaxis   Throat swells Has patient had a PCN reaction causing immediate rash, facial/tongue/throat swelling, SOB or lightheadedness with hypotension: yes Has patient had a PCN reaction causing severe rash involving mucus membranes or skin necrosis: no Has patient had a PCN reaction that required hospitalization- yes at hospital Has patient had a PCN reaction occurring within the last 10 years: no If all of the above answers are "NO", then may proceed with Cephalosporin use.   Rocephin [ceftriaxone Sodium In Dextrose] Swelling   Tingling around lips   Shellfish Allergy Anaphylaxis   Throat swells   Codeine Nausea Only   Metoclopramide Hcl    Doesn't recall type of reaction   Pregabalin Swelling   Sulfonamide Derivatives    Tongue swells   Adhesive [tape] Rash   Other Swelling, Itching, Rash   Almonds. Bananas cause an asthma attack.    Wellbutrin [bupropion] Rash   Hives, red whelps      Medication List    STOP taking these medications   doxycycline 100 MG capsule Commonly known as:  VIBRAMYCIN   levofloxacin 500 MG tablet Commonly known as:  LEVAQUIN   nitrofurantoin (macrocrystal-monohydrate) 100 MG capsule Commonly known as:  MACROBID   pioglitazone 45 MG tablet Commonly known as:  ACTOS     TAKE these medications   ACCU-CHEK FASTCLIX LANCETS Misc 1 each by Other route daily.   acyclovir 400 MG  tablet Commonly known as:  ZOVIRAX TAKE ONE TABLET BY MOUTH ONCE DAILY.   ADVAIR DISKUS 250-50 MCG/DOSE Aepb Generic drug:  Fluticasone-Salmeterol INHALE 1 PUFF TWICE DAILY; RINSE AFTER USE.   albuterol 108 (90 Base) MCG/ACT inhaler Commonly known as:  PROVENTIL HFA;VENTOLIN HFA USE 2 PUFFS EVERY 4 TO 6 HOURS AS NEEDED FOR SHORTNESS OF BREATH   atorvastatin 40 MG tablet Commonly known as:  LIPITOR TAKE 1 TABLET BY MOUTH ONCE DAILY AT 6:00 PM.   calcitRIOL 0.25 MCG capsule Commonly known as:  ROCALTROL TAKE ONE CAPSULE BY MOUTH ONCE DAILY What changed:    how much to take  how to take this  when to take this   cetirizine 10 MG tablet Commonly known as:  ZYRTEC TAKE ONE TABLET BY MOUTH ONCE DAILY.   Colchicine 0.6 MG Caps Take 0.6 mg by mouth 2 (two) times daily.   EPINEPHrine 0.3 mg/0.3 mL Soaj injection Commonly known as:  EPI-PEN INJECT 1  PEN INTRAMUSCULARLY AS NEEDED FOR ALLERGIC REACTION   escitalopram 20 MG tablet Commonly known as:  LEXAPRO TAKE 1 TABLET EVERY DAY.   ezetimibe 10 MG tablet Commonly known as:  ZETIA TAKE ONE TABLET BY MOUTH ONCE DAILY.   fenofibrate 145 MG tablet Commonly known as:  TRICOR Take 1 tablet (145 mg total) by mouth daily.   furosemide 20 MG tablet Commonly known as:  LASIX TAKE ONE TABLET BY MOUTH ONCE DAILY.   gabapentin 300 MG capsule Commonly known as:  NEURONTIN Take two capsule in the morning, one capsule in the afternoon  and 2 capsules at bedtime What changed:    how much to take  how to take this  when to take this   glucose blood test strip USE TO TEST BLOOD SUGAR ONCE DAILY   HYDROcodone-acetaminophen 10-325 MG tablet Commonly known as:  NORCO Take 1 tablet by mouth 3 (three) times daily.   hyoscyamine 0.125 MG SL tablet Commonly known as:  LEVSIN SL PLACE 1 TABLET UNDER THE TONGUE EVERY 4 HOURS AS NEEDED What changed:  See the new instructions.   insulin aspart 100 UNIT/ML FlexPen Commonly known  as:  NOVOLOG Before each meal 3 times a day, 140-199 - 2 units, 200-250 - 4 units, 251-299 - 6 units,  300-349 - 8 units,  350 or above 10 units. Insulin PEN if approved, provide syringes and needles if needed.   ipratropium-albuterol 0.5-2.5 (3) MG/3ML Soln Commonly known as:  DUONEB INHALE THE CONTENTS OF ONE VIAL VIA NEBULIZER BY MOUTH FOUR TIMES A DAY AS NEEDED FOR WHEEZING OR SHORTNESS OF BREATH   JANUVIA 100 MG tablet Generic drug:  sitaGLIPtin TAKE ONE TABLET BY MOUTH ONCE DAILY.   levothyroxine 137 MCG tablet Commonly known as:  SYNTHROID, LEVOTHROID TAKE 1 TABLET BEFORE BREAKFAST.   losartan 50 MG tablet Commonly known as:  COZAAR Take 1 tablet (50 mg total) by mouth daily. Start taking on:  04/20/2018   metFORMIN 1000 MG tablet Commonly known as:  GLUCOPHAGE TAKE 1 TABLET BY MOUTH TWICE DAILY WITH MEALS.   methocarbamol 500 MG tablet Commonly known as:  ROBAXIN TAKE (1) TABLET BY MOUTH TWICE A DAY AS NEEDED. What changed:    how much to take  how to take this  when to take this  reasons to take this  additional instructions   montelukast 10 MG tablet Commonly known as:  SINGULAIR TAKE (1) TABLET BY MOUTH AT BEDTIME.   NIASPAN 1000 MG CR tablet Generic drug:  niacin TAKE (2) TABLETS BY MOUTH AT BEDTIME.   nystatin cream Commonly known as:  MYCOSTATIN APPLY 1 APPLICATION TO AFFECTED AREA(S) TWICE DAILY as needed for irritations   pantoprazole 40 MG tablet Commonly known as:  PROTONIX TAKE ONE TABLET BY MOUTH 2 TIMES A DAY BEFORE A MEAL.   predniSONE 10 MG tablet Commonly known as:  DELTASONE Label  & dispense according to the schedule below. 10 Pills PO for 3 days then, 8 Pills PO for 3 days, 6 Pills PO for 3 days, 4 Pills PO for 3 days, 2 Pills PO for 3 days, 1 Pills PO for 3 days, 1/2 Pill  PO for 3 days then STOP. Total of 95 tabs   raloxifene 60 MG tablet Commonly known as:  EVISTA Take 1 tablet (60 mg total) by mouth daily.   sucralfate 1 g  tablet Commonly known as:  CARAFATE TAKE 1g TABLET BY MOUTH 4 TIMES A DAY WITH MEALS AND AT BEDTIME  as needed for stomach coating   traZODone 100 MG tablet Commonly known as:  DESYREL Take 1 tablet (100 mg total) by mouth at bedtime.   triamcinolone 0.025 % cream Commonly known as:  KENALOG APPLY 1 APPLICATION TOPICALLY TO AFFECTED AREA(S) TWICE DAILY as needed   VESICARE 5 MG tablet Generic drug:  solifenacin TAKE ONE TABLET BY MOUTH ONCE DAILY.   VOLTAREN 1 % Gel Generic drug:  diclofenac sodium APPLY 2 GRAMS TO AFFECTED AREA FOUR TIMES A DAY.      Allergies  Allergen Reactions  . Bee Venom Anaphylaxis  . Latex Shortness Of Breath  . Oxycodone-Acetaminophen Hives and Shortness Of Breath    Upset Stomach  . Penicillins Anaphylaxis    Throat swells Has patient had a PCN reaction causing immediate rash, facial/tongue/throat swelling, SOB or lightheadedness with hypotension: yes Has patient had a PCN reaction causing severe rash involving mucus membranes or skin necrosis: no Has patient had a PCN reaction that required hospitalization- yes at hospital Has patient had a PCN reaction occurring within the last 10 years: no If all of the above answers are "NO", then may proceed with Cephalosporin use.   . Rocephin [Ceftriaxone Sodium In Dextrose] Swelling    Tingling around lips  . Shellfish Allergy Anaphylaxis    Throat swells   . Codeine Nausea Only  . Metoclopramide Hcl     Doesn't recall type of reaction  . Pregabalin Swelling  . Sulfonamide Derivatives     Tongue swells   . Adhesive [Tape] Rash  . Other Swelling, Itching and Rash    Almonds. Bananas cause an asthma attack.   . Wellbutrin [Bupropion] Rash    Hives, red whelps   Follow-up Information    Arnoldo Lenis, MD Follow up in 2 week(s).   Specialty:  Cardiology Why:  pericardioeffusion, may need to repeat echocardiogram in two weeks, to be determined by Dr Harl Bowie. Contact information: [address] [place] [address] [phone]        Dena Billet B, PA-C Follow up in 1 week(s).   Specialty:  Physician Assistant Contact information: [place] [address] [phone]            The results of significant diagnostics from this hospitalization (including imaging, microbiology, ancillary and laboratory) are listed below for reference.    Significant Diagnostic Studies: Ct Abdomen Pelvis Wo Contrast  Result Date: 04/12/2018 CLINICAL DATA:  Generalized abdominal pain for 4 days. EXAM: CT ABDOMEN AND PELVIS WITHOUT CONTRAST TECHNIQUE: Multidetector CT imaging of the abdomen and pelvis was performed following the standard protocol without IV contrast. COMPARISON:  07/01/2017 FINDINGS: Lower chest: Pericardial effusion noted measuring up to 3.7 cm in thickness adjacent to the left ventricle. There is bilateral lower lobe collapse/consolidation. Hepatobiliary: The liver shows diffusely decreased attenuation suggesting steatosis. Layering gallbladder sludge. No intrahepatic or extrahepatic biliary dilation. Pancreas: No focal mass lesion. No dilatation of the main duct. No intraparenchymal cyst. No peripancreatic edema. Spleen: No splenomegaly. No focal mass lesion. Adrenals/Urinary Tract: No adrenal nodule or mass. Contrast excretion in the kidneys is compatible with contrast infused CT scan yesterday. Kidneys otherwise unremarkable. No evidence for hydroureter. The urinary bladder appears normal for the degree of distention. Stomach/Bowel: Stomach is nondistended. No gastric wall thickening. No evidence of outlet obstruction. Duodenum is normally positioned as is the ligament of Treitz. No small bowel wall thickening. No small bowel dilatation. The terminal ileum is normal. Diffuse circumferential wall  thickening with mural and pericolonic edema noted in the right and transverse colon extending distally to the sigmoid segment. Sigmoid colon and rectum are  relatively normal in CT appearance. Vascular/Lymphatic: There is abdominal aortic atherosclerosis without aneurysm. There is no gastrohepatic or hepatoduodenal ligament lymphadenopathy. No intraperitoneal or retroperitoneal lymphadenopathy. Clustered normal size lymph nodes are seen in the ileocolic mesentery. No pelvic sidewall lymphadenopathy. Reproductive: Uterus surgically absent.  There is no adnexal mass. Other: No intraperitoneal free fluid. Musculoskeletal: Small umbilical hernia contains only fat. Status post right total hip replacement. No worrisome lytic or sclerotic osseous abnormality. Multiple Schmorl's nodes are evident in the thoracolumbar spine. IMPRESSION: 1. Circumferential mural edema and thickening with pericolonic edema noted in the ascending, transverse, and descending colon. Imaging features compatible with infectious/inflammatory colitis. 2. Mild to moderate pericardial effusion appears similar to yesterday's chest CT. 3.  Aortic Atherosclerois (ICD10-170.0) Electronically Signed   By: Misty Stanley M.D.   On: 04/12/2018 17:25   Dg Chest 2 View  Result Date: 04/11/2018 CLINICAL DATA:  Lower abdominal pain with nausea, vomiting and diarrhea x4 days. EXAM: CHEST - 2 VIEW COMPARISON:  None. FINDINGS: Stable moderate cardiomegaly with mild interstitial edema. No effusion or pneumothorax. No acute osseous abnormality. IMPRESSION: Stable cardiomegaly.  No acute pulmonary disease. Electronically Signed   By: Ashley Royalty M.D.   On: 04/11/2018 19:39   Ct Angio Chest Pe W/cm &/or Wo Cm  Result Date: 04/12/2018 CLINICAL DATA:  Dyspnea with exertion. EXAM: CT ANGIOGRAPHY CHEST WITH CONTRAST TECHNIQUE: Multidetector CT imaging of the chest was performed using the standard protocol during bolus administration of intravenous contrast. Multiplanar CT image reconstructions and MIPs were obtained to evaluate the vascular anatomy. CONTRAST:  151mL ISOVUE-370 IOPAMIDOL (ISOVUE-370) INJECTION 76%  COMPARISON:  Plain radiographs dating back through 08/09/2010 FINDINGS: Cardiovascular: Heart size is top-normal. Moderate to large pericardial effusion is identified measuring up to 3.8 cm posterolaterally on the left and 2.5 cm along the right heart border. Opacification of the pulmonary arteries to the distal lobar level is noted. Beyond this, there is suboptimal opacification of the pulmonary arteries. No large central pulmonary embolus is noted. Mediastinum/Nodes: Nonaneurysmal atherosclerotic thoracic aorta. Motion related artifacts limit assessment along the proximal ascending portion. No dissection is apparent. Trachea and mainstem bronchi are patent. Esophagus is unremarkable. Conventional branch pattern of the great vessels with atherosclerosis at the origins. Lungs/Pleura: Passive atelectasis bilaterally with subsegmental atelectasis in the left upper lobe and lingula. No dominant mass or pneumothorax. Subsegmental atelectasis and/or scarring is noted medially within the left lower lobe. Tiny subpleural nodular densities seen in the right upper lobe measuring 3 mm, series 7/59. Upper Abdomen: Hepatic steatosis.  Otherwise negative. Musculoskeletal: Lower cervical spondylosis. Osteopenic appearance of the bones without acute fracture. Review of the MIP images confirms the above findings. IMPRESSION: 1. No large central pulmonary embolus to the distal lobar levels. 2. Borderline cardiomegaly with moderate to large pericardial effusion as above described measuring up to 3.8 cm posterolaterally on the left and 2.5 cm along the right heart border. 3. No active cardiopulmonary disease. 4. Hepatic steatosis. Aortic Atherosclerosis (ICD10-I70.0). Electronically Signed   By: Ashley Royalty M.D.   On: 04/12/2018 00:06   Dg Chest Port 1 View  Result Date: 04/13/2018 CLINICAL DATA:  Hypoxia.  History of COPD current smoker. EXAM: PORTABLE CHEST 1 VIEW COMPARISON:  Chest x-ray and chest CT scan of April 11, 2018  FINDINGS: The cardiopericardial silhouette remains enlarged. There is increased soft tissue density in  the right paratracheal and right hilar regions new since the previous study. There is no pleural effusion. The interstitial markings of both lungs are coarse but stable. The pulmonary vascularity is mildly engorged. IMPRESSION: Increased soft tissue density in the right hilar and right paratracheal region. This may reflect collapse of portions of the right middle and lower lobes. Lymphadenopathy is felt less likely. There is stable enlargement of the cardiac silhouette with mild pulmonary vascular congestion consistent with CHF. Chest CT scanning is recommended if the patient can tolerate the procedure. Electronically Signed   By: David  Martinique M.D.   On: 04/13/2018 09:26    Microbiology: Recent Results (from the past 240 hour(s))  Culture, Urine     Status: Abnormal (Preliminary result)   Collection Time: 04/12/18 12:35 PM  Result Value Ref Range Status   Specimen Description   Final    URINE, RANDOM Performed at [facility], [address] [place], [place] [address]    Special Requests   Final    NONE Performed at [facility], [address] [place], [place] [address]    Culture (A)  Final    20,000 COLONIES/mL [place] NOTIFIED Referred to [facility] in [place], [place] for Serotyping. Performed at [facility], [place] [address] [place], [place] [address]    Report Status PENDING  Incomplete   Organism ID, Bacteria SALMONELLA SPECIES (A)  Final      Susceptibility   Salmonella species - MIC*    AMPICILLIN <=2 SENSITIVE Sensitive     LEVOFLOXACIN <=0.12 SENSITIVE Sensitive     TRIMETH/SULFA <=20 SENSITIVE Sensitive     * 20,000 COLONIES/mL SALMONELLA SPECIES  Gastrointestinal Panel by PCR , Stool     Status: Abnormal   Collection Time: 04/12/18  9:00 PM  Result Value Ref Range Status   Campylobacter species NOT  DETECTED NOT DETECTED Final   Plesimonas shigelloides NOT DETECTED NOT DETECTED Final   Salmonella species DETECTED (A) NOT DETECTED Final    Comment: RESULT CALLED TO, READ BACK BY AND VERIFIED WITH: CHELSEA MOTLEY 04/13/18 1634 KLW    Yersinia enterocolitica NOT DETECTED NOT DETECTED Final   Vibrio species NOT DETECTED NOT DETECTED Final   Vibrio cholerae NOT DETECTED NOT DETECTED Final   Enteroaggregative E coli (EAEC) NOT DETECTED NOT DETECTED Final   Enteropathogenic E coli (EPEC) NOT DETECTED NOT DETECTED Final   Enterotoxigenic E coli (ETEC) NOT DETECTED NOT DETECTED Final   Shiga like toxin producing E coli (STEC) NOT DETECTED NOT DETECTED Final   Shigella/Enteroinvasive E coli (EIEC) NOT DETECTED NOT DETECTED Final   Cryptosporidium NOT DETECTED NOT DETECTED Final   Cyclospora cayetanensis NOT DETECTED NOT DETECTED Final   Entamoeba histolytica NOT DETECTED NOT DETECTED Final   Giardia lamblia NOT DETECTED NOT DETECTED Final   Adenovirus F40/41 NOT DETECTED NOT DETECTED Final   Astrovirus NOT DETECTED NOT DETECTED Final   Norovirus GI/GII NOT DETECTED NOT DETECTED Final   Rotavirus A NOT DETECTED NOT DETECTED Final   Sapovirus (I, II, IV, and V) NOT DETECTED NOT DETECTED Final    Comment: Performed at [facility], [address] [place], [place] [address]  MRSA PCR Screening     Status: None   Collection Time: 04/13/18  1:51 PM  Result Value Ref Range Status   MRSA by PCR NEGATIVE NEGATIVE Final    Comment:        The GeneXpert MRSA Assay (FDA approved for NASAL  specimens only), is one component of a comprehensive MRSA colonization surveillance program. It is not intended to diagnose MRSA infection nor to guide or monitor treatment for MRSA infections. Performed at [facility], [address], [place], [place] [address]   Culture, body fluid-bottle     Status: None   Collection Time: 04/13/18  4:50 PM  Result Value Ref Range Status   Specimen Description  FLUID PERITONEAL [facility]  Final   Special Requests NONE  Final   Culture   Final    NO GROWTH 5 DAYS Performed at [place] [address] [place], [place] [address]    Report Status 04/18/2018 FINAL  Final  Gram stain     Status: None   Collection Time: 04/13/18  4:50 PM  Result Value Ref Range Status   Specimen Description FLUID PERITONEAL  Final   Special Requests NONE  Final   Gram Stain   Final    NO WBC SEEN NO ORGANISMS SEEN Performed at [facility], [place] [address], [place], [place] [address]    Report Status 04/13/2018 FINAL  Final  Culture, blood (routine x 2)     Status: None   Collection Time: 04/13/18  6:56 PM  Result Value Ref Range Status   Specimen Description BLOOD RIGHT ARM  Final   Special Requests   Final    BOTTLES DRAWN AEROBIC AND ANAEROBIC Blood Culture adequate volume   Culture   Final    NO GROWTH 5 DAYS Performed at [facility], [place] [address], [place], [place] [address]    Report Status 04/18/2018 FINAL  Final  Culture, blood (routine x 2)     Status: None   Collection Time: 04/13/18  6:56 PM  Result Value Ref Range Status   Specimen Description BLOOD RIGHT ANTECUBITAL  Final   Special Requests   Final    BOTTLES DRAWN AEROBIC AND ANAEROBIC Blood Culture adequate volume   Culture   Final    NO GROWTH 5 DAYS Performed at [facility], [place] [address] [place], [place] [address]    Report Status 04/18/2018 FINAL  Final  MRSA PCR Screening     Status: None   Collection Time: 04/13/18  7:18 PM  Result Value Ref Range Status   MRSA by PCR NEGATIVE NEGATIVE Final    Comment:        The GeneXpert MRSA Assay (FDA approved for NASAL specimens only), is one component of a comprehensive MRSA colonization surveillance program. It is not intended to diagnose MRSA infection nor to guide or monitor treatment for MRSA infections. Performed at [facility], [place] [address] [place], [place] [address]   C difficile quick  scan w PCR reflex     Status: None   Collection Time: 04/13/18  8:41 PM  Result Value Ref Range Status   C Diff antigen NEGATIVE NEGATIVE Final   C Diff toxin NEGATIVE NEGATIVE Final   C Diff interpretation No C. difficile detected.  Final    Comment: Performed at [facility], [place] [address], [place],  [address]     Labs: Basic Metabolic Panel: Recent Labs  Lab 04/13/18 0541 04/13/18 2003 04/14/18 0233 04/16/18 0312 04/18/18 0830 04/19/18 0154  NA 138  --  139 141 137 137  K 4.4  --  3.7 4.3 4.3 4.3  CL 103  --  98 101 95* 99  CO2 29  --  31 29  27 24  GLUCOSE 163*  --  151* 202* 188* 192*  BUN 10  --  10 18 17 20   CREATININE 0.76 0.82 0.76 0.83 0.95 0.79  CALCIUM 7.7*  --  8.0* 8.7* 8.9 8.5*  MG  --   --   --   --   --  1.6*  PHOS  --   --   --  3.7  --   --    Liver Function Tests: Recent Labs  Lab 04/14/18 0233 04/16/18 0312  AST 18  --   ALT 19  --   ALKPHOS 69  --   BILITOT 0.7  --   PROT 6.1*  --   ALBUMIN 2.6* 2.7*   No results for input(s): LIPASE, AMYLASE in the last 168 hours. No results for input(s): AMMONIA in the last 168 hours. CBC: Recent Labs  Lab 04/13/18 2003 04/14/18 0233 04/16/18 0312 04/18/18 0830 04/19/18 0154  WBC 11.0* 15.5* 17.8* 23.4* 22.3*  NEUTROABS  --   --  15.5*  --  19.9*  HGB 10.7* 10.6* 11.6* 12.7 12.1  HCT 39.8 38.2 40.7 44.6 41.0  MCV 78.0 75.0* 73.2* 72.5* 72.7*  PLT 324 320 365 421* 329   Cardiac Enzymes: No results for input(s): CKTOTAL, CKMB, CKMBINDEX, TROPONINI in the last 168 hours. BNP: BNP (last 3 results) Recent Labs    04/11/18 2242  BNP 27.0    ProBNP (last 3 results) No results for input(s): PROBNP in the last 8760 hours.  CBG: Recent Labs  Lab 04/18/18 1132 04/18/18 1627 04/18/18 2219 04/19/18 0748 04/19/18 1134  GLUCAP 164* 228* 106* 205* 210*       Signed:  Florencia Reasons MD, PhD  [facility] 04/19/2018, 12:03 PM

## 2018-04-19 NOTE — Progress Notes (Signed)
Inpatient Diabetes Program Recommendations  AACE/ADA: New Consensus Statement on Inpatient Glycemic Control (2019)  Target Ranges:  Prepandial:   less than 140 mg/dL      Peak postprandial:   less than 180 mg/dL (1-2 hours)      Critically ill patients:  140 - 180 mg/dL   Results for Lisa Ewing, Lisa Ewing (MRN [id number]) as of 04/19/2018 09:23  Ref. Range 04/18/2018 06:38 04/18/2018 11:32 04/18/2018 16:27 04/18/2018 22:19 04/19/2018 07:48  Glucose-Capillary Latest Ref Range: 70 - 99 mg/dL 185 (H) 164 (H) 228 (H) 106 (H) 205 (H)   Review of Glycemic Control  Diabetes history: DM2 Outpatient Diabetes medications: Januvia 100 mg daily, Metformin 1000 mg BID Current orders for Inpatient glycemic control: Novolog 0-9 units TID with meals, Tradjenta 5 mg daily, Metformin 1000 mg BID, Novolog 3 units TID with meals;  Solumedrol 60 mg Q6H  Inpatient Diabetes Program Recommendations:  Correction (SSI): Please consider ordering Novolog 0-5 units QHS for bedtime correction scale. Insulin - Meal Coverage: If steroids are continued, please consider increasing meal coverage to Novolog 5 units TID with meals if patient eats at least 50% of meals.  Thanks, Barnie Alderman, RN, MSN, CDE Diabetes Coordinator Inpatient Diabetes Program [phone] (Team Pager from 8am to 5pm)

## 2018-04-19 NOTE — Progress Notes (Signed)
Removed Mid line on Rt. Arm and patient received education for how to give shot of insulin pen needle. Patient received discharge instructions with prescription of Colchicine. Patient understood it well as well as patient's daughter. Patient took her all belongings. HS [employer]

## 2018-04-19 NOTE — Telephone Encounter (Signed)
Received call from [place] case manager at [facility] concerning colchicine. Patient has medicaid and it is not covered. She is requesting that a PA be completed but this request has been made of the attending in the hospital. Explained that a prior auth from [place] does not have a quick turnaround and suggested that she contact Dr. Debara Pickett in the hospital for potential med changes. She cannot locate him on amion. Provided her with pager to [place] [place], so that she can get assistance from cardiology with potential med change before patient is discharged.

## 2018-04-19 NOTE — Care Management Note (Addendum)
Case Management Note  Patient Details  Name: SYVANNA CIOLINO MRN: [id number] Date of Birth: 06-19-1956  Subjective/Objective:  Pt admitted with pericardial effusion                  Action/Plan:   PTA from home, pt is wheelchair bound.  Pt informed CM that she has 3:1, hospital bed.  Pt has PCS  7 hours M-F , SS 4 hours - pt has already informed agency that she is returning home today so services will resume at discharge.  Pt has daughter in the home that with the aides pt has 24/7 support.    Pt has PCP and denied barriers with obtaining/paying for medications.  CM will continue to follow for discharge needs   Expected Discharge Date:  04/19/18               Expected Discharge Plan:  Home/Self Care  In-House Referral:     Discharge planning Services  CM Consult  Post Acute Care Choice:    Choice offered to:  Patient  DME Arranged:    DME Agency:     HH Arranged:  RN [place] Agency:  [place]  Status of Service:  Completed, signed off  If discussed at [place] of Stay Meetings, dates discussed:    Additional Comments: Update:  CM informed by attending that medicaid prior auth service provided alternative medication for colchicine that is covered - attending to write script.  CM requested resumption orders for [facility] through [facility] - agency aware that pt discharging today.  Attending agreed to perform PA today for colchicine.  CM asked heart doctor for samples and informed that medication requires prior auth - pt already has follow up appt with cardiology Maryclare Labrador, RN 04/19/2018, 1:41 PM

## 2018-04-21 ENCOUNTER — Telehealth: Payer: Self-pay | Admitting: Cardiology

## 2018-04-21 ENCOUNTER — Telehealth: Payer: Self-pay | Admitting: Physical Medicine & Rehabilitation

## 2018-04-21 ENCOUNTER — Other Ambulatory Visit: Payer: Self-pay | Admitting: Physician Assistant

## 2018-04-21 DIAGNOSIS — I3139 Other pericardial effusion (noninflammatory): Secondary | ICD-10-CM

## 2018-04-21 DIAGNOSIS — I313 Pericardial effusion (noninflammatory): Secondary | ICD-10-CM

## 2018-04-21 NOTE — Telephone Encounter (Signed)
Per pt phone call-- she was d/c from the hospital and told to make an apt for an Echo.. There is not an order in for this.

## 2018-04-21 NOTE — Telephone Encounter (Signed)
Will forward to Dr. Harl Bowie to see when echo needs to be done.

## 2018-04-21 NOTE — Telephone Encounter (Signed)
Limited echo in 1 week for pericardial effusion   J Loree Shehata MD

## 2018-04-21 NOTE — Telephone Encounter (Signed)
Order placed. Will notify front office to schedule.

## 2018-04-21 NOTE — Telephone Encounter (Signed)
Patient had been in hospital and we moved her appointment to 8/23 with Dr. Letta Pate for her injection.  Patient only has 21 pills left and will run out of meds before her appointment with Dr. Letta Pate.  Patient would like to know if she could have a few called in until her next appointment.  Patient also has to have transportation lined up for her appointment and wouldn't be able to come any sooner.  Please call patient.

## 2018-04-22 ENCOUNTER — Inpatient Hospital Stay: Payer: Medicaid Other | Admitting: Physician Assistant

## 2018-04-22 MED ORDER — HYDROCODONE-ACETAMINOPHEN 10-325 MG PO TABS: 1.0000 | ORAL_TABLET | Freq: Three times a day (TID) | ORAL | 0 refills | Status: AC

## 2018-04-22 NOTE — Telephone Encounter (Signed)
According to initial message, patient will need a bridge script for 2-3 days or 6-9 tablets of her hydrocodone.

## 2018-04-26 ENCOUNTER — Telehealth: Payer: Self-pay

## 2018-04-26 MED ORDER — NYSTATIN 100000 UNIT/ML MT SUSP: OROMUCOSAL | 0 refills | Status: DC

## 2018-04-26 NOTE — Telephone Encounter (Signed)
Nystatin suspension 5 mL 4 times daily --after meals and at bedtime--- swish and swallow Dispense # 120 ml +0.

## 2018-04-26 NOTE — Telephone Encounter (Signed)
Medication sent to pharmacy patient is aware

## 2018-04-26 NOTE — Telephone Encounter (Signed)
L[facility]wing with [facility] called and states patient has developed thrush and is asking if a rx can be sent to pharmacy. Pls advise

## 2018-04-27 ENCOUNTER — Ambulatory Visit: Payer: Medicaid Other | Admitting: Internal Medicine

## 2018-04-27 ENCOUNTER — Other Ambulatory Visit: Payer: Self-pay | Admitting: Physician Assistant

## 2018-04-27 ENCOUNTER — Encounter: Payer: Self-pay | Admitting: Internal Medicine

## 2018-04-27 ENCOUNTER — Other Ambulatory Visit: Payer: Self-pay

## 2018-04-27 VITALS — BP 140/74 | HR 112 | Temp 97.0°F | Ht 65.5 in | Wt 298.0 lb

## 2018-04-27 DIAGNOSIS — K5229 Other allergic and dietetic gastroenteritis and colitis: Secondary | ICD-10-CM

## 2018-04-27 MED ORDER — HYOSCYAMINE SULFATE 0.125 MG SL SUBL: SUBLINGUAL_TABLET | SUBLINGUAL | 2 refills | Status: DC
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
## 2018-04-27 NOTE — Patient Instructions (Signed)
Levsin .125 mg tabs - one SL -   AC and Qhs as needed for abdominal cramps / diarrhea (disp 40 with 2 refills)  Information on IBS performed  Plan for surveillance colonoscopy 2021  OV here in 2 months

## 2018-04-28 ENCOUNTER — Ambulatory Visit (HOSPITAL_COMMUNITY)
Admission: RE | Admit: 2018-04-28 | Discharge: 2018-04-28 | Disposition: A | Discharge status: Home / Self Care | Payer: Medicaid Other | Source: Ambulatory Visit | Attending: Cardiology | Admitting: Cardiology

## 2018-04-28 ENCOUNTER — Other Ambulatory Visit: Payer: Self-pay | Admitting: Physician Assistant

## 2018-04-28 DIAGNOSIS — Z720 Tobacco use: Secondary | ICD-10-CM | POA: Insufficient documentation

## 2018-04-28 DIAGNOSIS — E785 Hyperlipidemia, unspecified: Secondary | ICD-10-CM | POA: Diagnosis not present

## 2018-04-28 DIAGNOSIS — I313 Pericardial effusion (noninflammatory): Secondary | ICD-10-CM | POA: Insufficient documentation

## 2018-04-28 DIAGNOSIS — Z6841 Body Mass Index (BMI) 40.0 and over, adult: Secondary | ICD-10-CM | POA: Diagnosis not present

## 2018-04-28 DIAGNOSIS — J449 Chronic obstructive pulmonary disease, unspecified: Secondary | ICD-10-CM | POA: Insufficient documentation

## 2018-04-28 DIAGNOSIS — E119 Type 2 diabetes mellitus without complications: Secondary | ICD-10-CM | POA: Insufficient documentation

## 2018-04-28 DIAGNOSIS — I3139 Other pericardial effusion (noninflammatory): Secondary | ICD-10-CM

## 2018-04-28 NOTE — Progress Notes (Signed)
*  PRELIMINARY RESULTS* Echocardiogram Limited 2-D Echocardiogram has been performed.  Lisa Ewing 04/28/2018, 12:03 PM

## 2018-04-29 ENCOUNTER — Ambulatory Visit: Payer: Medicaid Other | Admitting: Physician Assistant

## 2018-04-29 ENCOUNTER — Other Ambulatory Visit: Payer: Self-pay | Admitting: Physician Assistant

## 2018-04-29 ENCOUNTER — Encounter: Payer: Self-pay | Admitting: Physician Assistant

## 2018-04-29 ENCOUNTER — Other Ambulatory Visit: Payer: Self-pay

## 2018-04-29 ENCOUNTER — Telehealth: Payer: Self-pay | Admitting: Physician Assistant

## 2018-04-29 VITALS — BP 126/78 | HR 84 | Temp 98.4°F

## 2018-04-29 DIAGNOSIS — R197 Diarrhea, unspecified: Secondary | ICD-10-CM

## 2018-04-29 DIAGNOSIS — I3139 Other pericardial effusion (noninflammatory): Secondary | ICD-10-CM

## 2018-04-29 DIAGNOSIS — J439 Emphysema, unspecified: Secondary | ICD-10-CM

## 2018-04-29 DIAGNOSIS — Z09 Encounter for follow-up examination after completed treatment for conditions other than malignant neoplasm: Secondary | ICD-10-CM

## 2018-04-29 DIAGNOSIS — I313 Pericardial effusion (noninflammatory): Secondary | ICD-10-CM

## 2018-04-29 DIAGNOSIS — E119 Type 2 diabetes mellitus without complications: Secondary | ICD-10-CM

## 2018-04-29 DIAGNOSIS — R319 Hematuria, unspecified: Secondary | ICD-10-CM

## 2018-04-29 DIAGNOSIS — N390 Urinary tract infection, site not specified: Secondary | ICD-10-CM

## 2018-04-29 MED ORDER — ACCU-CHEK FASTCLIX LANCETS MISC: 5 refills | Status: DC

## 2018-04-29 NOTE — Telephone Encounter (Signed)
Needs lancets and test strips for accucheck aviva plus meter to go to [facility]
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
## 2018-04-30 ENCOUNTER — Ambulatory Visit: Payer: Medicaid Other | Admitting: Physical Medicine & Rehabilitation

## 2018-04-30 ENCOUNTER — Encounter: Payer: Medicaid Other | Attending: Physical Medicine and Rehabilitation

## 2018-04-30 ENCOUNTER — Encounter: Payer: Self-pay | Admitting: Physical Medicine & Rehabilitation

## 2018-04-30 VITALS — BP 135/83 | HR 107 | Resp 14 | Ht 65.0 in | Wt 298.5 lb

## 2018-04-30 DIAGNOSIS — M25512 Pain in left shoulder: Secondary | ICD-10-CM | POA: Insufficient documentation

## 2018-04-30 DIAGNOSIS — E119 Type 2 diabetes mellitus without complications: Secondary | ICD-10-CM | POA: Insufficient documentation

## 2018-04-30 DIAGNOSIS — M47816 Spondylosis without myelopathy or radiculopathy, lumbar region: Secondary | ICD-10-CM | POA: Diagnosis not present

## 2018-04-30 DIAGNOSIS — M25551 Pain in right hip: Secondary | ICD-10-CM | POA: Diagnosis not present

## 2018-04-30 DIAGNOSIS — F17210 Nicotine dependence, cigarettes, uncomplicated: Secondary | ICD-10-CM | POA: Diagnosis not present

## 2018-04-30 DIAGNOSIS — E785 Hyperlipidemia, unspecified: Secondary | ICD-10-CM | POA: Insufficient documentation

## 2018-04-30 DIAGNOSIS — G8929 Other chronic pain: Secondary | ICD-10-CM | POA: Diagnosis not present

## 2018-04-30 DIAGNOSIS — M7918 Myalgia, other site: Secondary | ICD-10-CM | POA: Diagnosis not present

## 2018-04-30 DIAGNOSIS — J449 Chronic obstructive pulmonary disease, unspecified: Secondary | ICD-10-CM | POA: Diagnosis not present

## 2018-04-30 DIAGNOSIS — M47814 Spondylosis without myelopathy or radiculopathy, thoracic region: Secondary | ICD-10-CM | POA: Insufficient documentation

## 2018-04-30 DIAGNOSIS — M79604 Pain in right leg: Secondary | ICD-10-CM | POA: Diagnosis not present

## 2018-04-30 DIAGNOSIS — E039 Hypothyroidism, unspecified: Secondary | ICD-10-CM | POA: Insufficient documentation

## 2018-04-30 DIAGNOSIS — M810 Age-related osteoporosis without current pathological fracture: Secondary | ICD-10-CM | POA: Insufficient documentation

## 2018-04-30 DIAGNOSIS — K219 Gastro-esophageal reflux disease without esophagitis: Secondary | ICD-10-CM | POA: Insufficient documentation

## 2018-04-30 LAB — CBC WITH DIFFERENTIAL/PLATELET
BASOS PCT: 0.3 %
Basophils Absolute: 52 cells/uL (ref 0–200)
Eosinophils Absolute: 35 cells/uL (ref 15–500)
Eosinophils Relative: 0.2 %
HCT: 47.7 % — ABNORMAL HIGH (ref 35.0–45.0)
Hemoglobin: 14.2 g/dL (ref 11.7–15.5)
Lymphs Abs: 2384 cells/uL (ref 850–3900)
MCH: 22 pg — ABNORMAL LOW (ref 27.0–33.0)
MCHC: 29.8 g/dL — ABNORMAL LOW (ref 32.0–36.0)
MCV: 74.1 fL — ABNORMAL LOW (ref 80.0–100.0)
MONOS PCT: 8 %
MPV: 9.9 fL (ref 7.5–12.5)
Neutro Abs: 13537 cells/uL — ABNORMAL HIGH (ref 1500–7800)
Neutrophils Relative %: 77.8 %
PLATELETS: 556 10*3/uL — AB (ref 140–400)
RBC: 6.44 10*6/uL — AB (ref 3.80–5.10)
RDW: 22.3 % — AB (ref 11.0–15.0)
Total Lymphocyte: 13.7 %
WBC mixed population: 1392 cells/uL — ABNORMAL HIGH (ref 200–950)
WBC: 17.4 10*3/uL — AB (ref 3.8–10.8)

## 2018-04-30 LAB — BASIC METABOLIC PANEL WITH GFR
BUN / CREAT RATIO: 29 (calc) — AB (ref 6–22)
BUN: 27 mg/dL — AB (ref 7–25)
CHLORIDE: 95 mmol/L — AB (ref 98–110)
CO2: 28 mmol/L (ref 20–32)
Calcium: 9.6 mg/dL (ref 8.6–10.4)
Creat: 0.94 mg/dL (ref 0.50–0.99)
GFR, Est African American: 75 mL/min/{1.73_m2} (ref >=60)
GFR, Est Non African American: 65 mL/min/{1.73_m2} (ref >=60)
Glucose, Bld: 197 mg/dL — ABNORMAL HIGH (ref 65–99)
Potassium: 5.2 mmol/L (ref 3.5–5.3)
Sodium: 137 mmol/L (ref 135–146)

## 2018-04-30 MED ORDER — HYDROCODONE-ACETAMINOPHEN 10-325 MG PO TABS: 1.0000 | ORAL_TABLET | Freq: Three times a day (TID) | ORAL | 0 refills | Status: DC | PRN

## 2018-04-30 MED ORDER — GABAPENTIN 300 MG PO CAPS: ORAL_CAPSULE | ORAL | 2 refills | Status: DC

## 2018-04-30 NOTE — Progress Notes (Signed)
Trigger Point Injection  Indication: Cervical and thoracic Myofascial pain not relieved by medication management and other conservative care.  Informed consent was obtained after describing risk and benefits of the procedure with the patient, this includes bleeding, bruising, infection and medication side effects.  The patient wishes to proceed and has given written consent.  The patient was placed in a seated position.  Bilateral C7 paraspinal, bilateral upper trapezius, bilateral levator scapula, bilateral T7 ENT 9 paraspinal was marked and prepped with Betadine.  It was entered with a 25-gauge 1-1/2 inch needle and 1 mL of 1% lidocaine was injected into each of 10 trigger points, after negative draw back for blood.  The patient tolerated the procedure well.  Post procedure instructions were given.

## 2018-04-30 NOTE — Patient Instructions (Signed)
Trigger Point Injection  Trigger points are areas where you have pain. A trigger point injection is a shot given in the trigger point to help relieve pain for a few days to a few months. Common places for trigger points include:  · The neck.  · The shoulders.  · The upper back.  · The lower back.    A trigger point injection will not cure long-lasting (chronic) pain permanently. These injections do not always work for every person, but for some people they can help to relieve pain for a few days to a few months.  Tell a health care provider about:  · Any allergies you have.  · All medicines you are taking, including vitamins, herbs, eye drops, creams, and over-the-counter medicines.  · Any problems you or family members have had with anesthetic medicines.  · Any blood disorders you have.  · Any surgeries you have had.  · Any medical conditions you have.  What are the risks?  Generally, this is a safe procedure. However, problems may occur, including:  · Infection.  · Bleeding.  · Allergic reaction to the injected medicine.  · Irritation of the skin around the injection site.    What happens before the procedure?  · Ask your health care provider about changing or stopping your regular medicines. This is especially important if you are taking diabetes medicines or blood thinners.  What happens during the procedure?  · Your health care provider will feel for trigger points. A marker may be used to circle the area for the injection.  · The skin over the trigger point will be washed with a germ-killing (antiseptic) solution.  · A thin needle is used for the shot. You may feel pain or a twitching feeling when the needle enters the trigger point.  · A numbing solution may be injected into the trigger point. Sometimes a medicine to keep down swelling, redness, and warmth (inflammation) is also injected.  · Your health care provider may move the needle around the area where the trigger point is located until the tightness  and twitching goes away.  · After the injection, your health care provider may put gentle pressure over the injection site.  · The injection site will be covered with a bandage (dressing).  The procedure may vary among health care providers and hospitals.  What happens after the procedure?  · The dressing can be taken off in a few hours or as told by your health care provider.  · You may feel sore and stiff for 1-2 days.  This information is not intended to replace advice given to you by your health care provider. Make sure you discuss any questions you have with your health care provider.  Document Released: 08/14/2011 Document Revised: 04/27/2016 Document Reviewed: 02/12/2015  Elsevier Interactive Patient Education © 2018 [employer]

## 2018-04-30 NOTE — Telephone Encounter (Signed)
Refills sent to pharmacy. 

## 2018-05-04 LAB — URINE CULTURE

## 2018-05-17 ENCOUNTER — Telehealth: Payer: Self-pay

## 2018-05-17 NOTE — Telephone Encounter (Signed)
Patient called and states she caught a stomach bug from her daughter and now she has what she thinks is a UTI, patient states she has burning when urinating, as well as pressure and is requesting an antibiotic be called in. I advised patient she would need to be seen, patient states we have called something in before without her having to come in .Please advise

## 2018-05-18 ENCOUNTER — Ambulatory Visit: Payer: Medicaid Other | Admitting: Urology

## 2018-05-18 ENCOUNTER — Other Ambulatory Visit (HOSPITAL_COMMUNITY)
Admission: RE | Admit: 2018-05-18 | Discharge: 2018-05-18 | Disposition: A | Discharge status: Home / Self Care | Payer: Medicaid Other | Source: Ambulatory Visit | Attending: Urology | Admitting: Urology

## 2018-05-18 ENCOUNTER — Other Ambulatory Visit: Payer: Self-pay | Admitting: Physician Assistant

## 2018-05-18 DIAGNOSIS — N3000 Acute cystitis without hematuria: Secondary | ICD-10-CM | POA: Diagnosis present

## 2018-05-18 DIAGNOSIS — N952 Postmenopausal atrophic vaginitis: Secondary | ICD-10-CM

## 2018-05-18 DIAGNOSIS — R3121 Asymptomatic microscopic hematuria: Secondary | ICD-10-CM | POA: Diagnosis not present

## 2018-05-18 NOTE — Telephone Encounter (Signed)
NTBS. And needs urine culture. Has h/o colonization.

## 2018-05-19 NOTE — Telephone Encounter (Signed)
Patient called and left a message stating she had an appointment with  Urology and will have them to address her urine issue.
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
## 2018-05-21 LAB — URINE CULTURE

## 2018-05-24 ENCOUNTER — Ambulatory Visit (INDEPENDENT_AMBULATORY_CARE_PROVIDER_SITE_OTHER): Payer: Medicaid Other | Admitting: Physician Assistant

## 2018-05-24 ENCOUNTER — Encounter: Payer: Self-pay | Admitting: Physician Assistant

## 2018-05-24 VITALS — BP 116/68 | HR 91 | Ht 65.5 in | Wt 297.6 lb

## 2018-05-24 DIAGNOSIS — J449 Chronic obstructive pulmonary disease, unspecified: Secondary | ICD-10-CM | POA: Diagnosis not present

## 2018-05-24 DIAGNOSIS — I3139 Other pericardial effusion (noninflammatory): Secondary | ICD-10-CM

## 2018-05-24 DIAGNOSIS — I313 Pericardial effusion (noninflammatory): Secondary | ICD-10-CM

## 2018-05-24 DIAGNOSIS — E782 Mixed hyperlipidemia: Secondary | ICD-10-CM | POA: Diagnosis not present

## 2018-05-24 NOTE — Patient Instructions (Signed)

## 2018-05-25 ENCOUNTER — Other Ambulatory Visit: Payer: Self-pay | Admitting: Physician Assistant

## 2018-05-25 ENCOUNTER — Other Ambulatory Visit: Payer: Self-pay | Admitting: Urology

## 2018-05-25 DIAGNOSIS — N3001 Acute cystitis with hematuria: Secondary | ICD-10-CM

## 2018-05-25 DIAGNOSIS — R3121 Asymptomatic microscopic hematuria: Secondary | ICD-10-CM

## 2018-05-28 ENCOUNTER — Encounter: Payer: Self-pay | Admitting: Registered Nurse

## 2018-05-28 ENCOUNTER — Encounter: Payer: Medicaid Other | Attending: Physical Medicine and Rehabilitation | Admitting: Registered Nurse

## 2018-05-28 VITALS — BP 106/72 | HR 107 | Resp 14

## 2018-05-28 DIAGNOSIS — M7062 Trochanteric bursitis, left hip: Secondary | ICD-10-CM

## 2018-05-28 DIAGNOSIS — M5416 Radiculopathy, lumbar region: Secondary | ICD-10-CM

## 2018-05-28 DIAGNOSIS — M75101 Unspecified rotator cuff tear or rupture of right shoulder, not specified as traumatic: Secondary | ICD-10-CM

## 2018-05-28 DIAGNOSIS — E039 Hypothyroidism, unspecified: Secondary | ICD-10-CM | POA: Insufficient documentation

## 2018-05-28 DIAGNOSIS — M47816 Spondylosis without myelopathy or radiculopathy, lumbar region: Secondary | ICD-10-CM | POA: Diagnosis not present

## 2018-05-28 DIAGNOSIS — M25512 Pain in left shoulder: Secondary | ICD-10-CM | POA: Diagnosis not present

## 2018-05-28 DIAGNOSIS — M25551 Pain in right hip: Secondary | ICD-10-CM | POA: Diagnosis not present

## 2018-05-28 DIAGNOSIS — M961 Postlaminectomy syndrome, not elsewhere classified: Secondary | ICD-10-CM

## 2018-05-28 DIAGNOSIS — M79604 Pain in right leg: Secondary | ICD-10-CM | POA: Insufficient documentation

## 2018-05-28 DIAGNOSIS — E119 Type 2 diabetes mellitus without complications: Secondary | ICD-10-CM | POA: Diagnosis not present

## 2018-05-28 DIAGNOSIS — M7918 Myalgia, other site: Secondary | ICD-10-CM

## 2018-05-28 DIAGNOSIS — M7061 Trochanteric bursitis, right hip: Secondary | ICD-10-CM

## 2018-05-28 DIAGNOSIS — M810 Age-related osteoporosis without current pathological fracture: Secondary | ICD-10-CM | POA: Diagnosis not present

## 2018-05-28 DIAGNOSIS — M5412 Radiculopathy, cervical region: Secondary | ICD-10-CM

## 2018-05-28 DIAGNOSIS — IMO0002 Reserved for concepts with insufficient information to code with codable children: Secondary | ICD-10-CM

## 2018-05-28 DIAGNOSIS — Z79891 Long term (current) use of opiate analgesic: Secondary | ICD-10-CM

## 2018-05-28 DIAGNOSIS — G8929 Other chronic pain: Secondary | ICD-10-CM | POA: Diagnosis present

## 2018-05-28 DIAGNOSIS — M47814 Spondylosis without myelopathy or radiculopathy, thoracic region: Secondary | ICD-10-CM | POA: Diagnosis not present

## 2018-05-28 DIAGNOSIS — M542 Cervicalgia: Secondary | ICD-10-CM

## 2018-05-28 DIAGNOSIS — F17210 Nicotine dependence, cigarettes, uncomplicated: Secondary | ICD-10-CM | POA: Diagnosis not present

## 2018-05-28 DIAGNOSIS — Z5181 Encounter for therapeutic drug level monitoring: Secondary | ICD-10-CM

## 2018-05-28 DIAGNOSIS — K219 Gastro-esophageal reflux disease without esophagitis: Secondary | ICD-10-CM | POA: Insufficient documentation

## 2018-05-28 DIAGNOSIS — M7542 Impingement syndrome of left shoulder: Secondary | ICD-10-CM | POA: Diagnosis not present

## 2018-05-28 DIAGNOSIS — J449 Chronic obstructive pulmonary disease, unspecified: Secondary | ICD-10-CM | POA: Insufficient documentation

## 2018-05-28 DIAGNOSIS — E785 Hyperlipidemia, unspecified: Secondary | ICD-10-CM | POA: Diagnosis not present

## 2018-05-28 DIAGNOSIS — G894 Chronic pain syndrome: Secondary | ICD-10-CM

## 2018-05-28 DIAGNOSIS — M546 Pain in thoracic spine: Secondary | ICD-10-CM

## 2018-05-28 MED ORDER — METHOCARBAMOL 500 MG PO TABS: ORAL_TABLET | ORAL | 2 refills | Status: DC

## 2018-05-28 MED ORDER — HYDROCODONE-ACETAMINOPHEN 10-325 MG PO TABS: 1.0000 | ORAL_TABLET | Freq: Three times a day (TID) | ORAL | 0 refills | Status: AC | PRN
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
## 2018-05-31 ENCOUNTER — Encounter: Payer: Self-pay | Admitting: Registered Nurse

## 2018-06-03 ENCOUNTER — Other Ambulatory Visit: Payer: Self-pay | Admitting: Physician Assistant

## 2018-06-11 ENCOUNTER — Ambulatory Visit (HOSPITAL_COMMUNITY): Payer: Medicaid Other

## 2018-06-15 ENCOUNTER — Ambulatory Visit: Payer: Medicaid Other | Admitting: Internal Medicine

## 2018-06-21 ENCOUNTER — Telehealth: Payer: Self-pay

## 2018-06-21 NOTE — Telephone Encounter (Signed)
Approved.  

## 2018-06-21 NOTE — Telephone Encounter (Signed)
[facility] to see patient once every 2 months under the CAP program. VO given

## 2018-06-22 ENCOUNTER — Ambulatory Visit (HOSPITAL_COMMUNITY)
Admission: RE | Admit: 2018-06-22 | Discharge: 2018-06-22 | Disposition: A | Discharge status: Home / Self Care | Payer: Medicaid Other | Source: Ambulatory Visit | Attending: Physician Assistant | Admitting: Physician Assistant

## 2018-06-22 ENCOUNTER — Other Ambulatory Visit: Payer: Self-pay | Admitting: Physician Assistant

## 2018-06-22 DIAGNOSIS — I3139 Other pericardial effusion (noninflammatory): Secondary | ICD-10-CM

## 2018-06-22 DIAGNOSIS — I313 Pericardial effusion (noninflammatory): Secondary | ICD-10-CM | POA: Diagnosis present

## 2018-06-22 DIAGNOSIS — J449 Chronic obstructive pulmonary disease, unspecified: Secondary | ICD-10-CM | POA: Insufficient documentation

## 2018-06-22 NOTE — Progress Notes (Signed)
*  PRELIMINARY RESULTS* Echocardiogram Limited 2-D Echocardiogram has been performed.  Lisa Ewing 06/22/2018, 11:54 AM

## 2018-06-24 ENCOUNTER — Other Ambulatory Visit: Payer: Self-pay | Admitting: Physician Assistant

## 2018-06-28 ENCOUNTER — Other Ambulatory Visit: Payer: Self-pay | Admitting: Physician Assistant

## 2018-06-28 ENCOUNTER — Other Ambulatory Visit: Payer: Self-pay | Admitting: Family Medicine

## 2018-06-29 ENCOUNTER — Ambulatory Visit: Payer: Medicaid Other | Admitting: Physical Medicine & Rehabilitation

## 2018-06-29 ENCOUNTER — Encounter: Payer: Self-pay | Admitting: Physical Medicine & Rehabilitation

## 2018-06-29 ENCOUNTER — Encounter: Payer: Medicaid Other | Attending: Physical Medicine and Rehabilitation

## 2018-06-29 VITALS — BP 116/72 | HR 105 | Ht 65.5 in | Wt 289.0 lb

## 2018-06-29 DIAGNOSIS — M47816 Spondylosis without myelopathy or radiculopathy, lumbar region: Secondary | ICD-10-CM | POA: Insufficient documentation

## 2018-06-29 DIAGNOSIS — G8929 Other chronic pain: Secondary | ICD-10-CM | POA: Insufficient documentation

## 2018-06-29 DIAGNOSIS — Z79891 Long term (current) use of opiate analgesic: Secondary | ICD-10-CM

## 2018-06-29 DIAGNOSIS — Z5181 Encounter for therapeutic drug level monitoring: Secondary | ICD-10-CM

## 2018-06-29 DIAGNOSIS — M25551 Pain in right hip: Secondary | ICD-10-CM | POA: Insufficient documentation

## 2018-06-29 DIAGNOSIS — K219 Gastro-esophageal reflux disease without esophagitis: Secondary | ICD-10-CM | POA: Diagnosis not present

## 2018-06-29 DIAGNOSIS — M810 Age-related osteoporosis without current pathological fracture: Secondary | ICD-10-CM | POA: Insufficient documentation

## 2018-06-29 DIAGNOSIS — M79604 Pain in right leg: Secondary | ICD-10-CM | POA: Insufficient documentation

## 2018-06-29 DIAGNOSIS — E039 Hypothyroidism, unspecified: Secondary | ICD-10-CM | POA: Diagnosis not present

## 2018-06-29 DIAGNOSIS — M7062 Trochanteric bursitis, left hip: Secondary | ICD-10-CM

## 2018-06-29 DIAGNOSIS — M47814 Spondylosis without myelopathy or radiculopathy, thoracic region: Secondary | ICD-10-CM | POA: Diagnosis not present

## 2018-06-29 DIAGNOSIS — E785 Hyperlipidemia, unspecified: Secondary | ICD-10-CM | POA: Diagnosis not present

## 2018-06-29 DIAGNOSIS — E119 Type 2 diabetes mellitus without complications: Secondary | ICD-10-CM | POA: Insufficient documentation

## 2018-06-29 DIAGNOSIS — J449 Chronic obstructive pulmonary disease, unspecified: Secondary | ICD-10-CM | POA: Insufficient documentation

## 2018-06-29 DIAGNOSIS — F17210 Nicotine dependence, cigarettes, uncomplicated: Secondary | ICD-10-CM | POA: Insufficient documentation

## 2018-06-29 DIAGNOSIS — M25512 Pain in left shoulder: Secondary | ICD-10-CM | POA: Insufficient documentation

## 2018-06-29 MED ORDER — HYDROCODONE-ACETAMINOPHEN 10-325 MG PO TABS: 1.0000 | ORAL_TABLET | Freq: Three times a day (TID) | ORAL | 0 refills | Status: DC | PRN

## 2018-06-29 NOTE — Patient Instructions (Signed)
Trochanteric Bursitis Trochanteric bursitis is a condition that causes hip pain. Trochanteric bursitis happens when fluid-filled sacs (bursae) in the hip get irritated. Normally these sacs absorb shock and help strong bands of tissue (tendons) in your hip glide smoothly over each other and over your hip bones. What are the causes? This condition results from increased friction between the hip bones and the tendons that go over them. This condition can happen if you:  Have weak hips.  Use your hip muscles too much (overuse).  Get hit in the hip.  What increases the risk? This condition is more likely to develop in:  Women.  Adults who are middle-aged or older.  People with arthritis or a spinal condition.  People with weak buttocks muscles (gluteal muscles).  People who have one leg that is shorter than the other.  People who participate in certain kinds of athletic activities, such as: ? Running sports, especially long-distance running. ? Contact sports, like football or martial arts. ? Sports in which falls may occur, like skiing.  What are the signs or symptoms? The main symptom of this condition is pain and tenderness over the point of your hip. The pain may be:  Sharp and intense.  Dull and achy.  Felt on the outside of your thigh.  It may increase when you:  Lie on your side.  Walk or run.  Go up on stairs.  Sit.  Stand up after sitting.  Stand for long periods of time.  How is this diagnosed? This condition may be diagnosed based on:  Your symptoms.  Your medical history.  A physical exam.  Imaging tests, such as: ? X-rays to check your bones. ? An MRI or ultrasound to check your tendons and muscles.  During your physical exam, your health care provider will check the movement and strength of your hip. He or she may press on the point of your hip to check for pain. How is this treated? This condition may be treated by:  Resting.  Reducing  your activity.  Avoiding activities that cause pain.  Using crutches, a cane, or a walker to decrease the strain on your hip.  Taking medicine to help with swelling.  Having medicine injected into the bursae to help with swelling.  Using ice, heat, and massage therapy for pain relief.  Physical therapy exercises for strength and flexibility.  Surgery (rare).  Follow these instructions at home: Activity  Rest.  Avoid activities that cause pain.  Return to your normal activities as told by your health care provider. Ask your health care provider what activities are safe for you. Managing pain, stiffness, and swelling  Take over-the-counter and prescription medicines only as told by your health care provider.  If directed, apply heat to the injured area as told by your health care provider. ? Place a towel between your skin and the heat source. ? Leave the heat on for 20-30 minutes. ? Remove the heat if your skin turns bright red. This is especially important if you are unable to feel pain, heat, or cold. You may have a greater risk of getting burned.  If directed, apply ice to the injured area: ? Put ice in a plastic bag. ? Place a towel between your skin and the bag. ? Leave the ice on for 20 minutes, 2-3 times a day. General instructions  If the affected leg is one that you use for driving, ask your health care provider when it is safe to drive.    Use crutches, a cane, or a walker as told by your health care provider.  If one of your legs is shorter than the other, get fitted for a shoe insert.  Lose weight if you are overweight. How is this prevented?  Wear supportive footwear that is appropriate for your sport.  If you have hip pain, start any new exercise or sport slowly.  Maintain physical fitness, including: ? Strength. ? Flexibility. Contact a health care provider if:  Your pain does not improve with 2-4 weeks. Get help right away if:  You develop  severe pain.  You have a fever.  You develop increased redness over your hip.  You have a change in your bowel function or bladder function.  You cannot control the muscles in your feet. This information is not intended to replace advice given to you by your health care provider. Make sure you discuss any questions you have with your health care provider. Document Released: 10/02/2004 Document Revised: 04/30/2016 Document Reviewed: 08/10/2015 Elsevier Interactive Patient Education  2018 [employer]  

## 2018-06-29 NOTE — Progress Notes (Signed)
Trochanteric bursa injection Left  With  ultrasound guidance  Indication Trochanteric bursitis. Exam has tenderness over the greater trochanter of the hip. Pain has not responded to conservative care such as exercise therapy and oral medications. Pain interferes with sleep or with mobility Informed consent was obtained after describing risks and benefits of the procedure with the patient these include bleeding bruising and infection. Patient has signed written consent form. Patient placed in a lateral decubitus position with the affected hip superior. Short axis view of greater trochanter , inplane approach , needle tip approximated post lateral aspect of troch jus t off periosteum.Needle slightly withdrawn then 6mg  of betamethasone with 4 cc 1% lidocaine were injected. Patient tolerated procedure well. Post procedure instructions given.

## 2018-06-30 ENCOUNTER — Other Ambulatory Visit: Payer: Self-pay | Admitting: Physician Assistant

## 2018-06-30 ENCOUNTER — Other Ambulatory Visit: Payer: Self-pay | Admitting: Family Medicine

## 2018-07-01 ENCOUNTER — Ambulatory Visit (HOSPITAL_COMMUNITY): Payer: Medicaid Other

## 2018-07-03 LAB — TOXASSURE SELECT,+ANTIDEPR,UR

## 2018-07-05 ENCOUNTER — Telehealth: Payer: Self-pay | Admitting: *Deleted

## 2018-07-05 NOTE — Telephone Encounter (Signed)
Urine drug screen for this encounter is consistent for prescribed medication 

## 2018-07-06 ENCOUNTER — Emergency Department (HOSPITAL_COMMUNITY): Payer: Medicaid Other

## 2018-07-06 ENCOUNTER — Encounter: Payer: Self-pay | Admitting: Family Medicine

## 2018-07-06 ENCOUNTER — Encounter (HOSPITAL_COMMUNITY): Payer: Self-pay | Admitting: Emergency Medicine

## 2018-07-06 ENCOUNTER — Inpatient Hospital Stay (HOSPITAL_COMMUNITY)
Admission: EM | Admit: 2018-07-06 | Discharge: 2018-07-08 | DRG: 191 | Disposition: A | Discharge status: Home / Self Care | Payer: Medicaid Other | Attending: Internal Medicine | Admitting: Internal Medicine

## 2018-07-06 ENCOUNTER — Ambulatory Visit: Payer: Medicaid Other | Admitting: Family Medicine

## 2018-07-06 ENCOUNTER — Other Ambulatory Visit: Payer: Self-pay

## 2018-07-06 VITALS — BP 128/68 | HR 86 | Temp 99.0°F | Resp 24

## 2018-07-06 DIAGNOSIS — Z6841 Body Mass Index (BMI) 40.0 and over, adult: Secondary | ICD-10-CM

## 2018-07-06 DIAGNOSIS — R0902 Hypoxemia: Secondary | ICD-10-CM

## 2018-07-06 DIAGNOSIS — Z9119 Patient's noncompliance with other medical treatment and regimen: Secondary | ICD-10-CM

## 2018-07-06 DIAGNOSIS — E785 Hyperlipidemia, unspecified: Secondary | ICD-10-CM | POA: Diagnosis present

## 2018-07-06 DIAGNOSIS — F17200 Nicotine dependence, unspecified, uncomplicated: Secondary | ICD-10-CM

## 2018-07-06 DIAGNOSIS — R0603 Acute respiratory distress: Secondary | ICD-10-CM | POA: Diagnosis not present

## 2018-07-06 DIAGNOSIS — Z8542 Personal history of malignant neoplasm of other parts of uterus: Secondary | ICD-10-CM

## 2018-07-06 DIAGNOSIS — G4730 Sleep apnea, unspecified: Secondary | ICD-10-CM | POA: Diagnosis present

## 2018-07-06 DIAGNOSIS — E662 Morbid (severe) obesity with alveolar hypoventilation: Secondary | ICD-10-CM | POA: Diagnosis present

## 2018-07-06 DIAGNOSIS — Z79899 Other long term (current) drug therapy: Secondary | ICD-10-CM

## 2018-07-06 DIAGNOSIS — Z794 Long term (current) use of insulin: Secondary | ICD-10-CM

## 2018-07-06 DIAGNOSIS — R0602 Shortness of breath: Secondary | ICD-10-CM | POA: Diagnosis not present

## 2018-07-06 DIAGNOSIS — J441 Chronic obstructive pulmonary disease with (acute) exacerbation: Secondary | ICD-10-CM

## 2018-07-06 DIAGNOSIS — Z79890 Hormone replacement therapy (postmenopausal): Secondary | ICD-10-CM

## 2018-07-06 DIAGNOSIS — E039 Hypothyroidism, unspecified: Secondary | ICD-10-CM | POA: Diagnosis present

## 2018-07-06 DIAGNOSIS — F17210 Nicotine dependence, cigarettes, uncomplicated: Secondary | ICD-10-CM | POA: Diagnosis present

## 2018-07-06 DIAGNOSIS — E119 Type 2 diabetes mellitus without complications: Secondary | ICD-10-CM

## 2018-07-06 DIAGNOSIS — E1140 Type 2 diabetes mellitus with diabetic neuropathy, unspecified: Secondary | ICD-10-CM | POA: Diagnosis present

## 2018-07-06 DIAGNOSIS — J9601 Acute respiratory failure with hypoxia: Secondary | ICD-10-CM

## 2018-07-06 DIAGNOSIS — Z7952 Long term (current) use of systemic steroids: Secondary | ICD-10-CM

## 2018-07-06 DIAGNOSIS — Z833 Family history of diabetes mellitus: Secondary | ICD-10-CM

## 2018-07-06 DIAGNOSIS — E032 Hypothyroidism due to medicaments and other exogenous substances: Secondary | ICD-10-CM | POA: Diagnosis present

## 2018-07-06 DIAGNOSIS — Z90710 Acquired absence of both cervix and uterus: Secondary | ICD-10-CM

## 2018-07-06 DIAGNOSIS — J069 Acute upper respiratory infection, unspecified: Secondary | ICD-10-CM | POA: Diagnosis present

## 2018-07-06 DIAGNOSIS — Z720 Tobacco use: Secondary | ICD-10-CM | POA: Diagnosis present

## 2018-07-06 DIAGNOSIS — M810 Age-related osteoporosis without current pathological fracture: Secondary | ICD-10-CM | POA: Diagnosis present

## 2018-07-06 DIAGNOSIS — E7800 Pure hypercholesterolemia, unspecified: Secondary | ICD-10-CM | POA: Diagnosis present

## 2018-07-06 DIAGNOSIS — I10 Essential (primary) hypertension: Secondary | ICD-10-CM | POA: Diagnosis present

## 2018-07-06 DIAGNOSIS — Z7951 Long term (current) use of inhaled steroids: Secondary | ICD-10-CM

## 2018-07-06 DIAGNOSIS — J45901 Unspecified asthma with (acute) exacerbation: Secondary | ICD-10-CM | POA: Diagnosis present

## 2018-07-06 DIAGNOSIS — Z8541 Personal history of malignant neoplasm of cervix uteri: Secondary | ICD-10-CM

## 2018-07-06 DIAGNOSIS — B9789 Other viral agents as the cause of diseases classified elsewhere: Secondary | ICD-10-CM | POA: Diagnosis present

## 2018-07-06 DIAGNOSIS — Z791 Long term (current) use of non-steroidal anti-inflammatories (NSAID): Secondary | ICD-10-CM

## 2018-07-06 DIAGNOSIS — Z7401 Bed confinement status: Secondary | ICD-10-CM

## 2018-07-06 DIAGNOSIS — K219 Gastro-esophageal reflux disease without esophagitis: Secondary | ICD-10-CM | POA: Diagnosis present

## 2018-07-06 LAB — COMPREHENSIVE METABOLIC PANEL
ALBUMIN: 3.8 g/dL (ref 3.5–5.0)
ALT: 25 U/L (ref 0–44)
ANION GAP: 10 (ref 5–15)
AST: 27 U/L (ref 15–41)
Alkaline Phosphatase: 65 U/L (ref 38–126)
BUN: 23 mg/dL (ref 8–23)
CHLORIDE: 98 mmol/L (ref 98–111)
CO2: 28 mmol/L (ref 22–32)
Calcium: 8.4 mg/dL — ABNORMAL LOW (ref 8.9–10.3)
Creatinine, Ser: 1.02 mg/dL — ABNORMAL HIGH (ref 0.44–1.00)
GFR calc Af Amer: >60 mL/min (ref >60)
GFR calc non Af Amer: 58 mL/min — ABNORMAL LOW (ref >60)
GLUCOSE: 198 mg/dL — AB (ref 70–99)
POTASSIUM: 4.1 mmol/L (ref 3.5–5.1)
SODIUM: 136 mmol/L (ref 135–145)
Total Bilirubin: 0.5 mg/dL (ref 0.3–1.2)
Total Protein: 7 g/dL (ref 6.5–8.1)

## 2018-07-06 LAB — CBC WITH DIFFERENTIAL/PLATELET
ABS IMMATURE GRANULOCYTES: 0.56 10*3/uL — AB (ref 0.00–0.07)
BASOS PCT: 1 %
Basophils Absolute: 0.1 10*3/uL (ref 0.0–0.1)
Eosinophils Absolute: 0.2 10*3/uL (ref 0.0–0.5)
Eosinophils Relative: 2 %
HCT: 43.4 % (ref 36.0–46.0)
Hemoglobin: 12.2 g/dL (ref 12.0–15.0)
IMMATURE GRANULOCYTES: 5 %
LYMPHS PCT: 32 %
Lymphs Abs: 3.3 10*3/uL (ref 0.7–4.0)
MCH: 23.3 pg — ABNORMAL LOW (ref 26.0–34.0)
MCHC: 28.1 g/dL — ABNORMAL LOW (ref 30.0–36.0)
MCV: 82.8 fL (ref 80.0–100.0)
MONO ABS: 1.1 10*3/uL — AB (ref 0.1–1.0)
MONOS PCT: 11 %
NEUTROS ABS: 5.3 10*3/uL (ref 1.7–7.7)
NEUTROS PCT: 49 %
Platelets: 328 10*3/uL (ref 150–400)
RBC: 5.24 MIL/uL — AB (ref 3.87–5.11)
RDW: 24.3 % — ABNORMAL HIGH (ref 11.5–15.5)
WBC: 10.6 10*3/uL — ABNORMAL HIGH (ref 4.0–10.5)
nRBC: 0.9 % — ABNORMAL HIGH (ref 0.0–0.2)

## 2018-07-06 LAB — RESPIRATORY PANEL BY PCR
ADENOVIRUS-RVPPCR: NOT DETECTED
Bordetella pertussis: NOT DETECTED
CORONAVIRUS 229E-RVPPCR: NOT DETECTED
CORONAVIRUS HKU1-RVPPCR: NOT DETECTED
CORONAVIRUS NL63-RVPPCR: NOT DETECTED
CORONAVIRUS OC43-RVPPCR: NOT DETECTED
Chlamydophila pneumoniae: NOT DETECTED
Influenza A: NOT DETECTED
Influenza B: NOT DETECTED
MYCOPLASMA PNEUMONIAE-RVPPCR: NOT DETECTED
Metapneumovirus: NOT DETECTED
PARAINFLUENZA VIRUS 1-RVPPCR: NOT DETECTED
Parainfluenza Virus 2: NOT DETECTED
Parainfluenza Virus 3: NOT DETECTED
Parainfluenza Virus 4: NOT DETECTED
Respiratory Syncytial Virus: NOT DETECTED
Rhinovirus / Enterovirus: DETECTED — AB

## 2018-07-06 LAB — BRAIN NATRIURETIC PEPTIDE: B Natriuretic Peptide: 31.1 pg/mL (ref 0.0–100.0)

## 2018-07-06 LAB — MRSA PCR SCREENING: MRSA BY PCR: NEGATIVE

## 2018-07-06 LAB — TROPONIN I
Troponin I: <0.03 ng/mL (ref <0.03)
Troponin I: <0.03 ng/mL (ref <0.03)

## 2018-07-06 MED ORDER — PREDNISONE 20 MG PO TABS
40.0000 mg | ORAL_TABLET | Freq: Every day | ORAL | Status: DC
  Administered 2018-07-07: 40 mg via ORAL
  Filled 2018-07-06: qty 2

## 2018-07-06 MED ORDER — ALBUTEROL SULFATE (2.5 MG/3ML) 0.083% IN NEBU
2.5000 mg | INHALATION_SOLUTION | Freq: Once | RESPIRATORY_TRACT | Status: AC
  Administered 2018-07-06: 2.5 mg via RESPIRATORY_TRACT

## 2018-07-06 MED ORDER — METFORMIN HCL 500 MG PO TABS
1000.0000 mg | ORAL_TABLET | Freq: Two times a day (BID) | ORAL | Status: DC
  Administered 2018-07-07 – 2018-07-08 (×3): 1000 mg via ORAL
  Filled 2018-07-06 (×4): qty 2

## 2018-07-06 MED ORDER — ESCITALOPRAM OXALATE 20 MG PO TABS
20.0000 mg | ORAL_TABLET | Freq: Every day | ORAL | Status: DC
  Administered 2018-07-06 – 2018-07-08 (×3): 20 mg via ORAL
  Filled 2018-07-06 (×3): qty 1

## 2018-07-06 MED ORDER — HEPARIN SODIUM (PORCINE) 5000 UNIT/ML IJ SOLN: 5000.0000 [IU] | Freq: Three times a day (TID) | INTRAMUSCULAR | Status: DC

## 2018-07-06 MED ORDER — DEXTROMETHORPHAN POLISTIREX ER 30 MG/5ML PO SUER
60.0000 mg | Freq: Two times a day (BID) | ORAL | Status: DC
  Administered 2018-07-07 – 2018-07-08 (×4): 60 mg via ORAL
  Filled 2018-07-06 (×6): qty 10

## 2018-07-06 MED ORDER — ATORVASTATIN CALCIUM 40 MG PO TABS
40.0000 mg | ORAL_TABLET | Freq: Every day | ORAL | Status: DC
  Administered 2018-07-06 – 2018-07-07 (×2): 40 mg via ORAL
  Filled 2018-07-06 (×2): qty 1

## 2018-07-06 MED ORDER — NICOTINE 21 MG/24HR TD PT24
21.0000 mg | MEDICATED_PATCH | Freq: Every day | TRANSDERMAL | Status: DC
  Administered 2018-07-06 – 2018-07-08 (×3): 21 mg via TRANSDERMAL
  Filled 2018-07-06 (×3): qty 1

## 2018-07-06 MED ORDER — ALBUTEROL SULFATE HFA 108 (90 BASE) MCG/ACT IN AERS: INHALATION_SPRAY | RESPIRATORY_TRACT | 3 refills | Status: DC

## 2018-07-06 MED ORDER — EZETIMIBE 10 MG PO TABS
10.0000 mg | ORAL_TABLET | Freq: Every day | ORAL | Status: DC
  Administered 2018-07-06 – 2018-07-08 (×3): 10 mg via ORAL
  Filled 2018-07-06 (×3): qty 1

## 2018-07-06 MED ORDER — TRAZODONE HCL 100 MG PO TABS
100.0000 mg | ORAL_TABLET | Freq: Every day | ORAL | Status: DC
  Administered 2018-07-06 – 2018-07-07 (×2): 100 mg via ORAL
  Filled 2018-07-06: qty 2
  Filled 2018-07-06: qty 1

## 2018-07-06 MED ORDER — MAGNESIUM SULFATE 2 GM/50ML IV SOLN
2.0000 g | Freq: Once | INTRAVENOUS | Status: AC
  Administered 2018-07-06: 2 g via INTRAVENOUS
  Filled 2018-07-06: qty 50

## 2018-07-06 MED ORDER — ALBUTEROL SULFATE (2.5 MG/3ML) 0.083% IN NEBU: 2.5000 mg | INHALATION_SOLUTION | RESPIRATORY_TRACT | Status: DC | PRN

## 2018-07-06 MED ORDER — LEVOTHYROXINE SODIUM 25 MCG PO TABS
137.0000 ug | ORAL_TABLET | Freq: Every day | ORAL | Status: DC
  Administered 2018-07-07 – 2018-07-08 (×2): 137 ug via ORAL
  Filled 2018-07-06 (×2): qty 1

## 2018-07-06 MED ORDER — HEPARIN SODIUM (PORCINE) 5000 UNIT/ML IJ SOLN
5000.0000 [IU] | Freq: Three times a day (TID) | INTRAMUSCULAR | Status: DC
  Administered 2018-07-06 – 2018-07-08 (×5): 5000 [IU] via SUBCUTANEOUS
  Filled 2018-07-06 (×7): qty 1

## 2018-07-06 MED ORDER — RALOXIFENE HCL 60 MG PO TABS
60.0000 mg | ORAL_TABLET | Freq: Every day | ORAL | Status: DC
  Administered 2018-07-06 – 2018-07-08 (×3): 60 mg via ORAL
  Filled 2018-07-06 (×3): qty 1

## 2018-07-06 MED ORDER — AZITHROMYCIN 250 MG PO TABS: ORAL_TABLET | ORAL | 0 refills | Status: DC

## 2018-07-06 MED ORDER — HYOSCYAMINE SULFATE 0.125 MG SL SUBL
0.1250 mg | SUBLINGUAL_TABLET | SUBLINGUAL | Status: DC | PRN
  Filled 2018-07-06: qty 1

## 2018-07-06 MED ORDER — PANTOPRAZOLE SODIUM 40 MG PO TBEC
40.0000 mg | DELAYED_RELEASE_TABLET | Freq: Two times a day (BID) | ORAL | Status: DC
  Administered 2018-07-06 – 2018-07-08 (×4): 40 mg via ORAL
  Filled 2018-07-06 (×4): qty 1

## 2018-07-06 MED ORDER — FUROSEMIDE 20 MG PO TABS
20.0000 mg | ORAL_TABLET | Freq: Every day | ORAL | Status: DC
  Administered 2018-07-06 – 2018-07-08 (×3): 20 mg via ORAL
  Filled 2018-07-06 (×3): qty 1

## 2018-07-06 MED ORDER — DARIFENACIN HYDROBROMIDE ER 7.5 MG PO TB24
7.5000 mg | ORAL_TABLET | Freq: Every day | ORAL | Status: DC
  Administered 2018-07-06 – 2018-07-08 (×3): 7.5 mg via ORAL
  Filled 2018-07-06 (×3): qty 1

## 2018-07-06 MED ORDER — GABAPENTIN 300 MG PO CAPS
300.0000 mg | ORAL_CAPSULE | Freq: Every day | ORAL | Status: DC
  Administered 2018-07-07: 300 mg via ORAL
  Filled 2018-07-06: qty 1

## 2018-07-06 MED ORDER — CALCITRIOL 0.25 MCG PO CAPS
0.2500 ug | ORAL_CAPSULE | Freq: Every day | ORAL | Status: DC
  Administered 2018-07-06 – 2018-07-08 (×3): 0.25 ug via ORAL
  Filled 2018-07-06 (×3): qty 1

## 2018-07-06 MED ORDER — INSULIN ASPART 100 UNIT/ML ~~LOC~~ SOLN
0.0000 [IU] | Freq: Three times a day (TID) | SUBCUTANEOUS | Status: DC
  Administered 2018-07-07: 4 [IU] via SUBCUTANEOUS
  Administered 2018-07-07 (×2): 7 [IU] via SUBCUTANEOUS
  Administered 2018-07-08: 4 [IU] via SUBCUTANEOUS

## 2018-07-06 MED ORDER — ALBUTEROL (5 MG/ML) CONTINUOUS INHALATION SOLN
10.0000 mg/h | INHALATION_SOLUTION | RESPIRATORY_TRACT | Status: DC
  Administered 2018-07-06: 10 mg/h via RESPIRATORY_TRACT
  Filled 2018-07-06: qty 20

## 2018-07-06 MED ORDER — LORATADINE 10 MG PO TABS
10.0000 mg | ORAL_TABLET | Freq: Every day | ORAL | Status: DC
  Administered 2018-07-06 – 2018-07-08 (×3): 10 mg via ORAL
  Filled 2018-07-06 (×3): qty 1

## 2018-07-06 MED ORDER — ONDANSETRON HCL 4 MG PO TABS: 4.0000 mg | ORAL_TABLET | Freq: Four times a day (QID) | ORAL | Status: DC | PRN

## 2018-07-06 MED ORDER — LOSARTAN POTASSIUM 50 MG PO TABS
50.0000 mg | ORAL_TABLET | Freq: Every day | ORAL | Status: DC
  Administered 2018-07-06 – 2018-07-08 (×3): 50 mg via ORAL
  Filled 2018-07-06 (×3): qty 1

## 2018-07-06 MED ORDER — SODIUM CHLORIDE 0.9 % IV SOLN
INTRAVENOUS | Status: DC | PRN
  Administered 2018-07-06: 1000 mL via INTRAVENOUS

## 2018-07-06 MED ORDER — NIACIN ER (ANTIHYPERLIPIDEMIC) 500 MG PO TBCR
2000.0000 mg | EXTENDED_RELEASE_TABLET | Freq: Every day | ORAL | Status: DC
  Administered 2018-07-06: 2000 mg via ORAL
  Filled 2018-07-06 (×2): qty 4

## 2018-07-06 MED ORDER — LEVOFLOXACIN 750 MG PO TABS
750.0000 mg | ORAL_TABLET | Freq: Every day | ORAL | Status: DC
  Administered 2018-07-07: 750 mg via ORAL
  Filled 2018-07-06: qty 1

## 2018-07-06 MED ORDER — SODIUM CHLORIDE 0.9 % IV SOLN
500.0000 mg | Freq: Once | INTRAVENOUS | Status: AC
  Administered 2018-07-06: 500 mg via INTRAVENOUS
  Filled 2018-07-06: qty 500

## 2018-07-06 MED ORDER — METHYLPREDNISOLONE ACETATE 80 MG/ML IJ SUSP
80.0000 mg | Freq: Once | INTRAMUSCULAR | Status: AC
  Administered 2018-07-06: 80 mg via INTRAMUSCULAR

## 2018-07-06 MED ORDER — NYSTATIN 100000 UNIT/GM EX CREA
TOPICAL_CREAM | Freq: Two times a day (BID) | CUTANEOUS | Status: DC | PRN
  Filled 2018-07-06: qty 15

## 2018-07-06 MED ORDER — ALBUTEROL SULFATE (2.5 MG/3ML) 0.083% IN NEBU: 2.5000 mg | INHALATION_SOLUTION | RESPIRATORY_TRACT | 1 refills | Status: DC | PRN

## 2018-07-06 MED ORDER — PANTOPRAZOLE SODIUM 40 MG PO TBEC
40.0000 mg | DELAYED_RELEASE_TABLET | Freq: Every day | ORAL | Status: DC
  Administered 2018-07-06: 40 mg via ORAL
  Filled 2018-07-06: qty 1

## 2018-07-06 MED ORDER — SUCRALFATE 1 G PO TABS
1.0000 g | ORAL_TABLET | Freq: Three times a day (TID) | ORAL | Status: DC
  Administered 2018-07-06 – 2018-07-08 (×5): 1 g via ORAL
  Filled 2018-07-06 (×5): qty 1

## 2018-07-06 MED ORDER — HYDROCODONE-ACETAMINOPHEN 10-325 MG PO TABS
1.0000 | ORAL_TABLET | Freq: Three times a day (TID) | ORAL | Status: DC | PRN
  Administered 2018-07-06 – 2018-07-08 (×5): 1 via ORAL
  Filled 2018-07-06 (×5): qty 1

## 2018-07-06 MED ORDER — MONTELUKAST SODIUM 10 MG PO TABS
10.0000 mg | ORAL_TABLET | Freq: Every day | ORAL | Status: DC
  Administered 2018-07-06 – 2018-07-07 (×2): 10 mg via ORAL
  Filled 2018-07-06 (×2): qty 1

## 2018-07-06 MED ORDER — FENOFIBRATE 160 MG PO TABS
160.0000 mg | ORAL_TABLET | Freq: Every day | ORAL | Status: DC
  Administered 2018-07-06 – 2018-07-08 (×3): 160 mg via ORAL
  Filled 2018-07-06 (×3): qty 1

## 2018-07-06 MED ORDER — LINAGLIPTIN 5 MG PO TABS
5.0000 mg | ORAL_TABLET | Freq: Every day | ORAL | Status: DC
  Administered 2018-07-07 – 2018-07-08 (×2): 5 mg via ORAL
  Filled 2018-07-06 (×2): qty 1

## 2018-07-06 MED ORDER — ALBUTEROL SULFATE (2.5 MG/3ML) 0.083% IN NEBU: 2.5000 mg | INHALATION_SOLUTION | Freq: Four times a day (QID) | RESPIRATORY_TRACT | Status: DC

## 2018-07-06 MED ORDER — ONDANSETRON HCL 4 MG/2ML IJ SOLN: 4.0000 mg | Freq: Four times a day (QID) | INTRAMUSCULAR | Status: DC | PRN

## 2018-07-06 MED ORDER — METHOCARBAMOL 500 MG PO TABS
500.0000 mg | ORAL_TABLET | Freq: Three times a day (TID) | ORAL | Status: DC | PRN
  Administered 2018-07-06 – 2018-07-07 (×2): 500 mg via ORAL
  Filled 2018-07-06 (×2): qty 1

## 2018-07-06 MED ORDER — GABAPENTIN 300 MG PO CAPS
600.0000 mg | ORAL_CAPSULE | Freq: Two times a day (BID) | ORAL | Status: DC
  Administered 2018-07-06 – 2018-07-08 (×4): 600 mg via ORAL
  Filled 2018-07-06 (×4): qty 2

## 2018-07-06 MED ORDER — IPRATROPIUM-ALBUTEROL 0.5-2.5 (3) MG/3ML IN SOLN
3.0000 mL | Freq: Once | RESPIRATORY_TRACT | Status: AC
  Administered 2018-07-06: 3 mL via RESPIRATORY_TRACT

## 2018-07-06 MED ORDER — ACYCLOVIR 400 MG PO TABS
400.0000 mg | ORAL_TABLET | Freq: Every day | ORAL | Status: DC
  Administered 2018-07-06 – 2018-07-08 (×3): 400 mg via ORAL
  Filled 2018-07-06 (×3): qty 1

## 2018-07-06 MED ORDER — PREDNISONE 20 MG PO TABS: 40.0000 mg | ORAL_TABLET | Freq: Every day | ORAL | 0 refills | Status: DC

## 2018-07-06 MED ORDER — COLCHICINE 0.6 MG PO TABS
0.6000 mg | ORAL_TABLET | Freq: Two times a day (BID) | ORAL | Status: DC
  Administered 2018-07-06 – 2018-07-08 (×3): 0.6 mg via ORAL
  Filled 2018-07-06 (×4): qty 1

## 2018-07-06 MED ORDER — METHYLPREDNISOLONE SODIUM SUCC 40 MG IJ SOLR
40.0000 mg | Freq: Four times a day (QID) | INTRAMUSCULAR | Status: AC
  Administered 2018-07-06 – 2018-07-07 (×3): 40 mg via INTRAVENOUS
  Filled 2018-07-06 (×3): qty 1

## 2018-07-06 MED ORDER — IPRATROPIUM-ALBUTEROL 0.5-2.5 (3) MG/3ML IN SOLN
3.0000 mL | Freq: Four times a day (QID) | RESPIRATORY_TRACT | Status: DC
  Administered 2018-07-06 – 2018-07-07 (×5): 3 mL via RESPIRATORY_TRACT
  Filled 2018-07-06 (×5): qty 3

## 2018-07-06 MED ORDER — IPRATROPIUM BROMIDE 0.02 % IN SOLN: 0.5000 mg | Freq: Four times a day (QID) | RESPIRATORY_TRACT | Status: DC

## 2018-07-06 NOTE — ED Triage Notes (Signed)
Pt with worsening shortness of breath x 1 week, saw MD today and sats were 88% on RA, pt is not on O2 at home, but uses neb tx and inhalers at home. Pt received prior to ED: Albuterol 15mg  and Atrovent 1 mg and Solumedrol 125mg . Pt arrived on 4L [place], sats dropped to 84% on RA during triage, pt placed back on 4L [place]. Pt states she feels improved after treatments.

## 2018-07-06 NOTE — Progress Notes (Signed)
Patient ID: Lisa Ewing, female    DOB: 02-Sep-1956, 62 y.o.   MRN: [id number]  PCP: Delsa Grana, PA-C  Chief Complaint  Patient presents with  . Illness    x1 week- productive cough with yellow to green mucus, SOB, fever (T max unknown), weakness    Subjective:   Lisa Ewing is a 62 y.o. female, presents to clinic with CC of 5 days of rapidly worsening SOB, cough with productive sputum green to yellow, wheeze with subjective fever with decreased energy. Sick contact 5 days ago, sick grandkid, pt started having worsening coughing congestion wheeze and SOB.  She is current smoker - 1ppd, has COPD, has not had nebulizer medicines at home, but does have maintenance inhaler.  She has worsened gradually.  She denies CP, near syncope, orthopnea, LE edema.  She reports hx of pericardial effusion and recent hospitalization - but HH aid with her shakes her head in disagreement of this, and per chart review pt had recent ECHO this month that shows resolved pericardial effusion and Grade 1 dd.  Roughly 2 months ago she had hospitalization for CAP? COPD, and august chest She did have PCP follow up with Karis Juba on 04/29/18, who unfortunately is no longer with this practice   Follow up note addressed DM control.    Patient Active Problem List   Diagnosis Date Noted  . Acute respiratory failure with hypoxia and hypercapnia ([place]) 04/13/2018  . Acute metabolic encephalopathy 21/30/8657  . Nausea vomiting and diarrhea   . Pericardial tamponade   . Pericardial effusion 04/12/2018  . Sleep apnea 04/12/2018  . Acute lower UTI 04/12/2018  . Abdominal pain 04/12/2018  . Acute gastroenteritis 04/12/2018  . Intractable vomiting with nausea 04/12/2018  . Obesity, Class III, BMI 40-49.9 (morbid obesity) ([place]) 04/12/2018  . Dehydration 04/12/2018  . Fatty liver 05/14/2017  . Upper abdominal pain 05/13/2017  . RUQ pain 05/13/2017  . Iron deficiency anemia 05/13/2017  . Tobacco use  11/14/2016  . Diabetes mellitus type 2, uncomplicated ([place]) 84/69/6295  . Adhesive bursitis of left shoulder 03/04/2016  . Arthritis 03/01/2015  . Mucosal abnormality of stomach   . Mucosal abnormality of esophagus   . Vitamin D deficiency 01/02/2015  . Postlaminectomy syndrome, lumbar region 12/29/2014  . Chronic lumbar radiculopathy 12/29/2014  . Dysphagia, pharyngoesophageal phase 07/18/2014  . Dyspepsia 07/13/2014  . Subacromial impingement 04/05/2014  . Trochanteric bursitis of left hip 12/15/2013  . COPD exacerbation ([place]) 10/12/2013  . Tachycardia 10/12/2013  . Hypothyroid 07/11/2013  . Allergic rhinitis   . Asthma   . COPD (chronic obstructive pulmonary disease) ([place])   . Depression   . Hyperlipidemia   . Osteoporosis   . Smoker   . Urinary tract infection 06/02/2013  . Leukocytosis, unspecified 05/28/2013  . IBS (irritable bowel syndrome) 05/11/2013  . Diarrhea 03/02/2013  . Abdominal pain, chronic, right upper quadrant 03/02/2013  . Gait disorder 01/14/2012  . Chronic leg pain 12/19/2011  . Chronic low back pain 12/19/2011  . Chronic neck pain 12/19/2011  . Shoulder pain, bilateral 12/19/2011  . Thoracic back pain 12/19/2011  . ABDOMINAL PAIN, GENERALIZED 02/06/2010  . GERD 11/27/2009  . CONSTIPATION, CHRONIC 11/27/2009  . History of colonic polyps 11/27/2009  . [place] ALLERGY 11/27/2009  . Allergy to latex 11/27/2009     Prior to Admission medications   Medication Sig Start Date End Date Taking? Authorizing Provider  ACCU-CHEK AVIVA PLUS test strip USE TO TEST ONCE DAILY. 04/30/18  Yes Dena Billet B, PA-C  ACCU-CHEK FASTCLIX LANCETS MISC Use as directed 04/29/18  Yes Dena Billet B, PA-C  acyclovir (ZOVIRAX) 400 MG tablet TAKE ONE TABLET BY MOUTH ONCE DAILY. 06/28/18  Yes Susy Frizzle, MD  ADVAIR DISKUS 250-50 MCG/DOSE AEPB INHALE 1 PUFF TWICE DAILY; RINSE AFTER USE. 06/30/18  Yes Susy Frizzle, MD  albuterol (PROVENTIL HFA) 108 (90 Base) MCG/ACT  inhaler USE 2 PUFFS EVERY 4 TO 6 HOURS AS NEEDED FOR SHORTNESS OF BREATH 01/08/17  Yes Dena Billet B, PA-C  atorvastatin (LIPITOR) 40 MG tablet TAKE 1 TABLET BY MOUTH ONCE DAILY AT 6:00 PM. 06/30/18  Yes Susy Frizzle, MD  calcitRIOL (ROCALTROL) 0.25 MCG capsule TAKE ONE CAPSULE BY MOUTH ONCE DAILY Patient taking differently: TAKE 0.33mcg CAPSULE BY MOUTH ONCE DAILY 08/18/13  Yes Susy Frizzle, MD  cetirizine (ZYRTEC) 10 MG tablet TAKE ONE TABLET BY MOUTH ONCE DAILY. 04/27/18  Yes Orlena Sheldon, PA-C  Colchicine (MITIGARE) 0.6 MG CAPS Take 0.6 mg by mouth 2 (two) times daily. 04/19/18 07/18/18 Yes Florencia Reasons, MD  EPINEPHrine 0.3 mg/0.3 mL IJ SOAJ injection INJECT 1 ML INTO THE SKIN AS NEEDED FOR ALLERGIC REACTION. 06/25/18  Yes Dixon, Mary B, PA-C  escitalopram (LEXAPRO) 20 MG tablet TAKE 1 TABLET EVERY DAY. 06/30/18  Yes Susy Frizzle, MD  ezetimibe (ZETIA) 10 MG tablet TAKE ONE TABLET BY MOUTH ONCE DAILY. 06/30/18  Yes Susy Frizzle, MD  fenofibrate (TRICOR) 145 MG tablet TAKE 1 TABLET BY MOUTH ONCE A DAY. 06/30/18  Yes Susy Frizzle, MD  furosemide (LASIX) 20 MG tablet TAKE ONE TABLET BY MOUTH ONCE DAILY. 06/30/18  Yes Susy Frizzle, MD  gabapentin (NEURONTIN) 300 MG capsule Take two capsule in the morning, one capsule in the afternoon  and 2 capsules at bedtime 04/30/18  Yes Kirsteins, Luanna Salk, MD  HYDROcodone-acetaminophen (NORCO) 10-325 MG tablet Take 1 tablet by mouth 3 (three) times daily as needed for moderate pain. 06/29/18  Yes Kirsteins, Luanna Salk, MD  hyoscyamine (LEVSIN SL) 0.125 MG SL tablet PLACE 1 TABLET UNDER THE TONGUE EVERY 4 HOURS AS NEEDED Patient taking differently: PLACE 1 TABLET UNDER THE TONGUE EVERY 4 HOURS AS NEEDED for cramping 02/19/16  Yes Walden Field A, NP  insulin aspart (NOVOLOG) 100 UNIT/ML FlexPen Before each meal 3 times a day, 140-199 - 2 units, 200-250 - 4 units, 251-299 - 6 units,  300-349 - 8 units,  350 or above 10 units. Insulin PEN if  approved, provide syringes and needles if needed. 04/19/18  Yes Florencia Reasons, MD  ipratropium-albuterol (DUONEB) 0.5-2.5 (3) MG/3ML SOLN INHALE THE CONTENTS OF ONE VIAL VIA NEBULIZER BY MOUTH FOUR TIMES A DAY AS NEEDED FOR WHEEZING OR SHORTNESS OF BREATH 01/08/17  Yes Dixon, Mary B, PA-C  JANUVIA 100 MG tablet TAKE ONE TABLET BY MOUTH ONCE DAILY. 06/30/18  Yes Susy Frizzle, MD  levothyroxine (SYNTHROID, LEVOTHROID) 137 MCG tablet TAKE 1 TABLET BEFORE BREAKFAST. 06/25/18  Yes Dixon, Mary B, PA-C  losartan (COZAAR) 50 MG tablet TAKE 1 TABLET BY MOUTH ONCE A DAY. 06/30/18  Yes Susy Frizzle, MD  metFORMIN (GLUCOPHAGE) 1000 MG tablet TAKE 1 TABLET BY MOUTH TWICE DAILY WITH MEALS. 06/30/18  Yes Susy Frizzle, MD  methocarbamol (ROBAXIN) 500 MG tablet TAKE (1) TABLET BY MOUTH TWICE A DAY AS NEEDED. 05/28/18  Yes Bayard Hugger, NP  montelukast (SINGULAIR) 10 MG tablet TAKE (1) TABLET BY MOUTH AT BEDTIME. 06/30/18  Yes  Susy Frizzle, MD  NIASPAN 1000 MG CR tablet TAKE (2) TABLETS BY MOUTH AT BEDTIME. 06/30/18  Yes Susy Frizzle, MD  nystatin (MYCOSTATIN) 100000 UNIT/ML suspension Take 16ml 4 times daily after meals and at bedtime swish and swallow. 04/26/18  Yes Dena Billet B, PA-C  nystatin cream (MYCOSTATIN) APPLY 1 APPLICATION TO AFFECTED AREA(S) TWICE DAILY as needed for irritations 01/08/17  Yes Dixon, Mary B, PA-C  pantoprazole (PROTONIX) 40 MG tablet TAKE ONE TABLET BY MOUTH 2 TIMES A DAY BEFORE A MEAL. 01/19/18  Yes Mahala Menghini, PA-C  raloxifene (EVISTA) 60 MG tablet TAKE ONE TABLET BY MOUTH ONCE DAILY. 06/30/18  Yes Susy Frizzle, MD  sucralfate (CARAFATE) 1 g tablet TAKE 1g TABLET BY MOUTH 4 TIMES A DAY WITH MEALS AND AT BEDTIME as needed for stomach coating 01/08/17  Yes Dixon, Mary B, PA-C  traZODone (DESYREL) 100 MG tablet Take 1 tablet (100 mg total) by mouth at bedtime. 08/09/12  Yes Kirchmayer, Deanna, MD  triamcinolone (KENALOG) 0.025 % cream APPLY 1 APPLICATION TOPICALLY TO  AFFECTED AREA(S) TWICE DAILY as needed 01/08/17  Yes Dixon, Mary B, PA-C  VESICARE 5 MG tablet TAKE ONE TABLET BY MOUTH ONCE DAILY. 06/30/18  Yes Susy Frizzle, MD  VOLTAREN 1 % GEL APPLY 2 GRAMS TO AFFECTED AREA FOUR TIMES A DAY. 01/19/18  Yes Bayard Hugger, NP  solifenacin (VESICARE) 5 MG tablet Take 10 mg by mouth daily.    [provider]  solifenacin (VESICARE) 5 MG tablet Take 10 mg by mouth daily.  03/03/13  [provider]     Allergies  Allergen Reactions  . Bee Venom Anaphylaxis  . Latex Shortness Of Breath  . Oxycodone-Acetaminophen Hives and Shortness Of Breath    Upset Stomach  . Penicillins Anaphylaxis    Throat swells Has patient had a PCN reaction causing immediate rash, facial/tongue/throat swelling, SOB or lightheadedness with hypotension: yes Has patient had a PCN reaction causing severe rash involving mucus membranes or skin necrosis: no Has patient had a PCN reaction that required hospitalization- yes at hospital Has patient had a PCN reaction occurring within the last 10 years: no If all of the above answers are "NO", then may proceed with Cephalosporin use.   . Rocephin [Ceftriaxone Sodium In Dextrose] Swelling    Tingling around lips  . Shellfish Allergy Anaphylaxis    Throat swells   . Codeine Nausea Only  . Metoclopramide Hcl     Doesn't recall type of reaction  . Pregabalin Swelling  . Sulfonamide Derivatives     Tongue swells   . Adhesive [Tape] Rash  . Other Swelling, Itching and Rash    Almonds. Bananas cause an asthma attack.   . Wellbutrin [Bupropion] Rash    Hives, red whelps     Family History  Problem Relation Age of Onset  . Hyperlipidemia Mother   . Diabetes Father   . Breast cancer Maternal Aunt   . Throat cancer Maternal Grandmother   . Colon cancer Neg Hx      Social History   Socioeconomic History  . Marital status: Divorced    Spouse name: Not on file  . Number of children: Not on file  . Years of  education: Not on file  . Highest education level: Not on file  Occupational History  . Not on file  Social Needs  . Financial resource strain: Not on file  . Food insecurity:    Worry: Not on file  Inability: Not on file  . Transportation needs:    Medical: Not on file    Non-medical: Not on file  Tobacco Use  . Smoking status: Current Every Day Smoker    Packs/day: 1.00    Years: 42.00    Pack years: 42.00    Types: Cigarettes  . Smokeless tobacco: Never Used  . Tobacco comment: has patch, but still smokes 4 packs per week  Substance and Sexual Activity  . Alcohol use: Not Currently    Alcohol/week: 0.0 standard drinks  . Drug use: No  . Sexual activity: Never    Birth control/protection: Surgical  Lifestyle  . Physical activity:    Days per week: Not on file    Minutes per session: Not on file  . Stress: Not on file  Relationships  . Social connections:    Talks on phone: Not on file    Gets together: Not on file    Attends religious service: Not on file    Active member of club or organization: Not on file    Attends meetings of clubs or organizations: Not on file    Relationship status: Not on file  . Intimate partner violence:    Fear of current or ex partner: Not on file    Emotionally abused: Not on file    Physically abused: Not on file    Forced sexual activity: Not on file  Other Topics Concern  . Not on file  Social History Narrative  . Not on file     Review of Systems     Objective:    Vitals:   07/06/18 0929  BP: 128/68  Pulse: 86  Resp: (!) 24  Temp: 99 F (37.2 C)  TempSrc: Oral  SpO2: 90%      Physical Exam  Constitutional: She appears well-developed. She appears distressed.  Obese, unwell appearing female, tachypneic   HENT:  Head: Normocephalic and atraumatic.  Nose: Nose normal.  Eyes: Conjunctivae are normal. Right eye exhibits no discharge. Left eye exhibits no discharge.  Neck: No tracheal deviation present.    Cardiovascular: Regular rhythm. Tachycardia present. Exam reveals distant heart sounds.  Pulses:      Radial pulses are 2+ on the right side, and 2+ on the left side.  Pulmonary/Chest: No accessory muscle usage or stridor. Tachypnea noted. She is in respiratory distress. She has decreased breath sounds in the right middle field, the right lower field, the left middle field and the left lower field. She has wheezes. She has rales in the right middle field and the left middle field.  Musculoskeletal: Normal range of motion.  Neurological: She is alert. She exhibits normal muscle tone. Coordination normal.  Skin: Skin is warm and dry. No rash noted. She is not diaphoretic.  Psychiatric: She has a normal mood and affect. Her behavior is normal.  Nursing note and vitals reviewed.     After multiple breathing tx, steroid injection, pt continues to be tachypneic and SpO2 87 RA    Assessment & Plan:      ICD-10-CM   1. Respiratory distress R06.03   2. COPD exacerbation (HCC) J44.1 albuterol (PROVENTIL) (2.5 MG/3ML) 0.083% nebulizer solution 2.5 mg    ipratropium-albuterol (DUONEB) 0.5-2.5 (3) MG/3ML nebulizer solution 3 mL    Check Pulse Oximetry while ambulating    DG Chest 2 View    CBC with Differential/Platelet    albuterol (PROVENTIL) (2.5 MG/3ML) 0.083% nebulizer solution 2.5 mg    methylPREDNISolone acetate (DEPO-MEDROL)  injection 80 mg  3. Type 2 diabetes mellitus without complication, without long-term current use of insulin (HCC) X48.0 COMPLETE METABOLIC PANEL WITH GFR    Hemoglobin A1c  4. Smoker F17.200   5. Shortness of breath R06.02 Brain natriuretic peptide    CBC with Differential/Platelet    albuterol (PROVENTIL) (2.5 MG/3ML) 0.083% nebulizer solution 2.5 mg    methylPREDNISolone acetate (DEPO-MEDROL) injection 80 mg    Could not get pt's SpO2 above 87% on RA  Going to hospital   Delsa Grana, PA-C 07/06/18 10:03 AM

## 2018-07-06 NOTE — Progress Notes (Signed)
Patient arrived to office in wheelchair. O2 on RA at rest: 88-90% O2 on RA at rest after breathing tx: 73% O2 on RA at rest after 2cd breathing tx: 83% O2 on 2L/min via [place] at rest: 92%
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
## 2018-07-06 NOTE — ED Notes (Signed)
Bed: SH38 Expected date:  Expected time:  Means of arrival:  Comments: EMS 63yo COPD exacerbation

## 2018-07-06 NOTE — ED Notes (Signed)
Patient transported to X-ray 
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
## 2018-07-07 DIAGNOSIS — R0902 Hypoxemia: Secondary | ICD-10-CM | POA: Diagnosis not present

## 2018-07-07 DIAGNOSIS — Z791 Long term (current) use of non-steroidal anti-inflammatories (NSAID): Secondary | ICD-10-CM | POA: Diagnosis not present

## 2018-07-07 DIAGNOSIS — J441 Chronic obstructive pulmonary disease with (acute) exacerbation: Secondary | ICD-10-CM | POA: Diagnosis present

## 2018-07-07 DIAGNOSIS — E662 Morbid (severe) obesity with alveolar hypoventilation: Secondary | ICD-10-CM | POA: Diagnosis present

## 2018-07-07 DIAGNOSIS — Z6841 Body Mass Index (BMI) 40.0 and over, adult: Secondary | ICD-10-CM | POA: Diagnosis not present

## 2018-07-07 DIAGNOSIS — K219 Gastro-esophageal reflux disease without esophagitis: Secondary | ICD-10-CM | POA: Diagnosis present

## 2018-07-07 DIAGNOSIS — J069 Acute upper respiratory infection, unspecified: Secondary | ICD-10-CM | POA: Diagnosis present

## 2018-07-07 DIAGNOSIS — E1140 Type 2 diabetes mellitus with diabetic neuropathy, unspecified: Secondary | ICD-10-CM | POA: Diagnosis present

## 2018-07-07 DIAGNOSIS — E785 Hyperlipidemia, unspecified: Secondary | ICD-10-CM | POA: Diagnosis present

## 2018-07-07 DIAGNOSIS — Z833 Family history of diabetes mellitus: Secondary | ICD-10-CM | POA: Diagnosis not present

## 2018-07-07 DIAGNOSIS — Z79899 Other long term (current) drug therapy: Secondary | ICD-10-CM | POA: Diagnosis not present

## 2018-07-07 DIAGNOSIS — Z79890 Hormone replacement therapy (postmenopausal): Secondary | ICD-10-CM | POA: Diagnosis not present

## 2018-07-07 DIAGNOSIS — Z8542 Personal history of malignant neoplasm of other parts of uterus: Secondary | ICD-10-CM | POA: Diagnosis not present

## 2018-07-07 DIAGNOSIS — Z9119 Patient's noncompliance with other medical treatment and regimen: Secondary | ICD-10-CM | POA: Diagnosis not present

## 2018-07-07 DIAGNOSIS — F17210 Nicotine dependence, cigarettes, uncomplicated: Secondary | ICD-10-CM | POA: Diagnosis present

## 2018-07-07 DIAGNOSIS — I10 Essential (primary) hypertension: Secondary | ICD-10-CM | POA: Diagnosis present

## 2018-07-07 DIAGNOSIS — Z8541 Personal history of malignant neoplasm of cervix uteri: Secondary | ICD-10-CM | POA: Diagnosis not present

## 2018-07-07 DIAGNOSIS — Z90710 Acquired absence of both cervix and uterus: Secondary | ICD-10-CM | POA: Diagnosis not present

## 2018-07-07 DIAGNOSIS — Z7952 Long term (current) use of systemic steroids: Secondary | ICD-10-CM | POA: Diagnosis not present

## 2018-07-07 DIAGNOSIS — B9789 Other viral agents as the cause of diseases classified elsewhere: Secondary | ICD-10-CM | POA: Diagnosis present

## 2018-07-07 DIAGNOSIS — E039 Hypothyroidism, unspecified: Secondary | ICD-10-CM | POA: Diagnosis present

## 2018-07-07 DIAGNOSIS — Z794 Long term (current) use of insulin: Secondary | ICD-10-CM | POA: Diagnosis not present

## 2018-07-07 DIAGNOSIS — E7800 Pure hypercholesterolemia, unspecified: Secondary | ICD-10-CM | POA: Diagnosis present

## 2018-07-07 DIAGNOSIS — Z7951 Long term (current) use of inhaled steroids: Secondary | ICD-10-CM | POA: Diagnosis not present

## 2018-07-07 DIAGNOSIS — J45901 Unspecified asthma with (acute) exacerbation: Secondary | ICD-10-CM | POA: Diagnosis present

## 2018-07-07 LAB — CBC WITH DIFFERENTIAL/PLATELET
Abs Immature Granulocytes: 0.75 10*3/uL — ABNORMAL HIGH (ref 0.00–0.07)
BASOS ABS: 0.1 10*3/uL (ref 0.0–0.1)
BASOS PCT: 1 %
EOS ABS: 0 10*3/uL (ref 0.0–0.5)
EOS PCT: 0 %
HCT: 39.8 % (ref 36.0–46.0)
Hemoglobin: 11.4 g/dL — ABNORMAL LOW (ref 12.0–15.0)
Immature Granulocytes: 7 %
LYMPHS PCT: 14 %
Lymphs Abs: 1.4 10*3/uL (ref 0.7–4.0)
MCH: 23.4 pg — AB (ref 26.0–34.0)
MCHC: 28.6 g/dL — AB (ref 30.0–36.0)
MCV: 81.6 fL (ref 80.0–100.0)
Monocytes Absolute: 0.2 10*3/uL (ref 0.1–1.0)
Monocytes Relative: 2 %
NRBC: 1.1 % — AB (ref 0.0–0.2)
Neutro Abs: 7.7 10*3/uL (ref 1.7–7.7)
Neutrophils Relative %: 76 %
PLATELETS: 304 10*3/uL (ref 150–400)
RBC: 4.88 MIL/uL (ref 3.87–5.11)
RDW: 24.2 % — AB (ref 11.5–15.5)
WBC: 10.1 10*3/uL (ref 4.0–10.5)

## 2018-07-07 LAB — GLUCOSE, CAPILLARY
GLUCOSE-CAPILLARY: 230 mg/dL — AB (ref 70–99)
Glucose-Capillary: 224 mg/dL — ABNORMAL HIGH (ref 70–99)
Glucose-Capillary: 200 mg/dL — ABNORMAL HIGH (ref 70–99)
Glucose-Capillary: 164 mg/dL — ABNORMAL HIGH (ref 70–99)

## 2018-07-07 LAB — TROPONIN I: Troponin I: <0.03 ng/mL (ref <0.03)

## 2018-07-07 MED ORDER — METHYLPREDNISOLONE SODIUM SUCC 40 MG IJ SOLR
40.0000 mg | Freq: Every day | INTRAMUSCULAR | Status: DC
  Administered 2018-07-08: 40 mg via INTRAVENOUS
  Filled 2018-07-07: qty 1

## 2018-07-07 MED ORDER — DICLOFENAC SODIUM 1 % TD GEL
4.0000 g | Freq: Four times a day (QID) | TRANSDERMAL | Status: DC
  Administered 2018-07-07 – 2018-07-08 (×6): 4 g via TOPICAL
  Filled 2018-07-07: qty 100

## 2018-07-07 NOTE — Progress Notes (Addendum)
Spoke with Moshe Salisbury RN with infection control and verified that contact and droplet precautions could be d/c'd

## 2018-07-07 NOTE — Progress Notes (Signed)
Received pt via wheelchair from ICU with nursing staff present.Pt denies pain at this time with no s/s of distress noted. Pts assessment is unchanged from earlier documentation. Pt orientated to the room and unit. Call bell is within reach.

## 2018-07-07 NOTE — Progress Notes (Signed)
Pt transferred to the chair, pt required a 2 person assist. Pt placed on room air but required 2 Liters nasal cannula during and after transfer due to O2 sats dropping into the 70's.

## 2018-07-07 NOTE — Progress Notes (Signed)
PROGRESS NOTE    Lisa Ewing  GGY:[id number] DOB: July 06, 1956 DOA: 07/06/2018 PCP: Delsa Grana, PA-C      Brief Narrative:  Lisa Ewing is a 62 y.o. F with morbid obesity (BMI 47), remote hx asthma, hypothyroidism, OSA not CPAP compliant, and smoiking who presents with several days cough, and dyspnea.  Went to PCPs office and was hypoxic.   Assessment & Plan:  Rhinovirus Obesity hypoventilation syndrome Likely asthma flare Patient had PFTs 1 year ago with a restrictive lung defect but actually normal FEV1/FVC and no change to FEV1 with bronchodilator.  Possible she has only intermittent obstruction from asthma/hyperreactivity, but I think more likely actually she doesn't have asthma, does have OHS.  Chest x-ra without pneumonia.   Given wheezing at admission and prior chart history asthma, will defer to treating with steroids.  She remains hypoxic and dyspneic with exertion. -Continue IV Solu-medrol -Continue montlukast -Continue nebulized bronchodilators -Stop Levaquin -Pulmonology consult as outpatient to sort out Asthma vs COPD vs OHS alone  Hypertension -Continue furosemide, losartan  Diabetes -Continue metformin, linagliptin -Continue SSI -Continue gabapentin  Hypercholesterolemia -Continue atorvastatin, Zetia, fibrate  Hypothyroidism -Continue levothyroxine  Recent pericardial effusion Recent echo normal, no suspicion that current symptoms are from effusion.  Other medications -Continue acyclovir, colchicine, darifenacin, diclofenac, escitalopram, loratadine, raloxifene, sucralfate, trazodone, pantoprazole    MDM and disposition: The below labs and imaging reports were reviewed and summarized above.  Medication management as above. Reviewed old PFTs and summarized above.  The patient was admitted with a severe flare of her chronic asthma, requiring supplemental O2 and IV solu-medrol.  She remains on O2 today, unable to wean.  She is still dyspneic, needs  assistance with transfers, ADLs.    I suspect a component of this is OHS.         DVT prophylaxis: Heparin Code Status: FULL Family Communication: None present    Consultants:   None  Procedures:   None  Antimicrobials:   Levaquin x1   Subjective: Feeling tired, dyspneic with exertion.  No   Objective: Vitals:   07/07/18 1600 07/07/18 1629 07/07/18 1641 07/07/18 1717  BP: (!) 74/35 (!) 109/43 (!) 118/48 (!) 150/127  Pulse: 99 100 96 95  Resp: 15 20 14  (!) 22  Temp:    98.5 F (36.9 C)  TempSrc:    Oral  SpO2: 96% 92% 93% 91%  Weight:        Intake/Output Summary (Last 24 hours) at 07/07/2018 1838 Last data filed at 07/07/2018 1700 Gross per 24 hour  Intake 735.8 ml  Output 1200 ml  Net -464.2 ml   Filed Weights   07/06/18 1450  Weight: 132.6 kg    Examination: General appearance: obese adult female, alert and in no acute distress.   HEENT: Anicteric, conjunctiva pink, lids and lashes normal. No nasal deformity, discharge, epistaxis.  Lips moist.   Skin: Warm and dry.  no jaundice.  No suspicious rashes or lesions. Cardiac: RRR, nl S1-S2, no murmurs appreciated.  Capillary refill is brisk.  JVP not visible.  No LE edema.  Radia  pulses 2+ and symmetric. Respiratory: Tachynpeic with exertion, coarse rhonchi rare wheezes. Abdomen: Abdomen soft.  no TTP. No ascites, distension, hepatosplenomegaly.   MSK: No deformities or effusions. Neuro: Awake and alert.  EOMI, moves all extremities. Speech fluent.    Psych: Sensorium intact and responding to questions, attention normal. Affect normal.  Judgment and insight appear normal.    Data Reviewed: I have personally reviewed  following labs and imaging studies:  CBC: Recent Labs  Lab 07/06/18 1239 07/07/18 0059  WBC 10.6* 10.1  NEUTROABS 5.3 7.7  HGB 12.2 11.4*  HCT 43.4 39.8  MCV 82.8 81.6  PLT 328 824   Basic Metabolic Panel: Recent Labs  Lab 07/06/18 1239  NA 136  K 4.1  CL 98  CO2 28    GLUCOSE 198*  BUN 23  CREATININE 1.02*  CALCIUM 8.4*   GFR: Estimated Creatinine Clearance: 79.4 mL/min (A) (by C-G formula based on SCr of 1.02 mg/dL (H)). Liver Function Tests: Recent Labs  Lab 07/06/18 1239  AST 27  ALT 25  ALKPHOS 65  BILITOT 0.5  PROT 7.0  ALBUMIN 3.8   No results for input(s): LIPASE, AMYLASE in the last 168 hours. No results for input(s): AMMONIA in the last 168 hours. Coagulation Profile: No results for input(s): INR, PROTIME in the last 168 hours. Cardiac Enzymes: Recent Labs  Lab 07/06/18 1239 07/06/18 1917 07/07/18 0059  TROPONINI <0.03 <0.03 <0.03   BNP (last 3 results) No results for input(s): PROBNP in the last 8760 hours. HbA1C: No results for input(s): HGBA1C in the last 72 hours. CBG: Recent Labs  Lab 07/07/18 0748 07/07/18 1202 07/07/18 1618  GLUCAP 224* 230* 200*   Lipid Profile: No results for input(s): CHOL, HDL, LDLCALC, TRIG, CHOLHDL, LDLDIRECT in the last 72 hours. Thyroid Function Tests: No results for input(s): TSH, T4TOTAL, FREET4, T3FREE, THYROIDAB in the last 72 hours. Anemia Panel: No results for input(s): VITAMINB12, FOLATE, FERRITIN, TIBC, IRON, RETICCTPCT in the last 72 hours. Urine analysis:    Component Value Date/Time   COLORURINE AMBER (A) 04/12/2018 1235   APPEARANCEUR CLOUDY (A) 04/12/2018 1235   LABSPEC >1.046 (H) 04/12/2018 1235   PHURINE 5.0 04/12/2018 1235   GLUCOSEU NEGATIVE 04/12/2018 1235   HGBUR MODERATE (A) 04/12/2018 1235   BILIRUBINUR NEGATIVE 04/12/2018 1235   KETONESUR NEGATIVE 04/12/2018 1235   PROTEINUR 100 (A) 04/12/2018 1235   UROBILINOGEN 0.2 03/14/2015 1026   NITRITE POSITIVE (A) 04/12/2018 1235   LEUKOCYTESUR LARGE (A) 04/12/2018 1235   Sepsis Labs: @LABRCNTIP (procalcitonin:4,lacticacidven:4)  ) Recent Results (from the past 240 hour(s))  Respiratory Panel by PCR     Status: Abnormal   Collection Time: 07/06/18  3:20 PM  Result Value Ref Range Status   Adenovirus NOT  DETECTED NOT DETECTED Final   Coronavirus 229E NOT DETECTED NOT DETECTED Final   Coronavirus HKU1 NOT DETECTED NOT DETECTED Final   Coronavirus NL63 NOT DETECTED NOT DETECTED Final   Coronavirus OC43 NOT DETECTED NOT DETECTED Final   Metapneumovirus NOT DETECTED NOT DETECTED Final   Rhinovirus / Enterovirus DETECTED (A) NOT DETECTED Final   Influenza A NOT DETECTED NOT DETECTED Final   Influenza B NOT DETECTED NOT DETECTED Final   Parainfluenza Virus 1 NOT DETECTED NOT DETECTED Final   Parainfluenza Virus 2 NOT DETECTED NOT DETECTED Final   Parainfluenza Virus 3 NOT DETECTED NOT DETECTED Final   Parainfluenza Virus 4 NOT DETECTED NOT DETECTED Final   Respiratory Syncytial Virus NOT DETECTED NOT DETECTED Final   Bordetella pertussis NOT DETECTED NOT DETECTED Final   Chlamydophila pneumoniae NOT DETECTED NOT DETECTED Final   Mycoplasma pneumoniae NOT DETECTED NOT DETECTED Final    Comment: Performed at [facility], [place] [address] [place], [place] [address]  MRSA PCR Screening     Status: None   Collection Time: 07/06/18 10:19 PM  Result Value Ref Range Status   MRSA by PCR  NEGATIVE NEGATIVE Final    Comment:        The GeneXpert MRSA Assay (FDA approved for NASAL specimens only), is one component of a comprehensive MRSA colonization surveillance program. It is not intended to diagnose MRSA infection nor to guide or monitor treatment for MRSA infections. Performed at [facility], [employer] [employer] [address], [place], [place] [address]          Radiology Studies: Dg Chest 2 View  Result Date: 07/06/2018 CLINICAL DATA:  Shortness of breath. EXAM: CHEST - 2 VIEW COMPARISON:  Radiograph of April 13, 2018. FINDINGS: Stable cardiomegaly. No pneumothorax or pleural effusion is noted. Both lungs are clear. The visualized skeletal structures are unremarkable. IMPRESSION: No active cardiopulmonary disease. Electronically Signed   By: Marijo Conception, M.D.   On:  07/06/2018 12:38        Scheduled Meds: . acyclovir  400 mg Oral Daily  . atorvastatin  40 mg Oral q1800  . calcitRIOL  0.25 mcg Oral Daily  . colchicine  0.6 mg Oral BID  . darifenacin  7.5 mg Oral Daily  . dextromethorphan  60 mg Oral BID  . diclofenac sodium  4 g Topical QID  . escitalopram  20 mg Oral Daily  . ezetimibe  10 mg Oral Daily  . fenofibrate  160 mg Oral Daily  . furosemide  20 mg Oral Daily  . gabapentin  300 mg Oral Q1500  . gabapentin  600 mg Oral BID  . heparin  5,000 Units Subcutaneous Q8H  . insulin aspart  0-20 Units Subcutaneous TID WC  . ipratropium-albuterol  3 mL Nebulization Q6H  . levofloxacin  750 mg Oral Q1500  . levothyroxine  137 mcg Oral Q0600  . linagliptin  5 mg Oral Daily  . loratadine  10 mg Oral Daily  . losartan  50 mg Oral Daily  . metFORMIN  1,000 mg Oral BID WC  . montelukast  10 mg Oral QHS  . niacin  2,000 mg Oral QHS  . nicotine  21 mg Transdermal Daily  . pantoprazole  40 mg Oral BID  . predniSONE  40 mg Oral Q breakfast  . raloxifene  60 mg Oral Daily  . sucralfate  1 g Oral TID WC & HS  . traZODone  100 mg Oral QHS   Continuous Infusions: . sodium chloride Stopped (07/07/18 1012)     LOS: 0 days    Time spent: 35 minutes    Edwin Dada, MD [facility] 07/07/2018, 6:38 PM     Pager [phone] --- please page though AMION:  [email or web address] If 7PM-7AM, please contact night-coverage

## 2018-07-07 NOTE — Evaluation (Signed)
Occupational Therapy Evaluation Patient Details Name: Lisa Ewing MRN: [id number] DOB: 15-Oct-1955 Today's Date: 07/07/2018    History of Present Illness 62 yo admitted with pericardial effusion s/p pericardiocentesis. PMhx: obese, pericardial effusion, COPD, chronic pain, depression, DM, uterine CA   Clinical Impression   Pt admitted with the above. Pt currently with functional limitations due to the deficits listed below (see OT Problem List).  Pt will benefit from skilled OT to increase their safety and independence with ADL and functional mobility for ADL to facilitate discharge to venue listed below.     Follow Up Recommendations  No OT follow up;Supervision/Assistance - 24 hour    Equipment Recommendations  None recommended by OT    Recommendations for Other Services       Precautions / Restrictions Precautions Precautions: Fall      Mobility Bed Mobility Overal bed mobility: Modified Independent                Transfers Overall transfer level: Needs assistance   Transfers: Sit to/from Stand;Squat Pivot Transfers;Stand Pivot Transfers Sit to Stand: Min guard Stand pivot transfers: Min guard Squat pivot transfers: Min guard     General transfer comment: Pt puts most of weight on L leg        ADL either performed or assessed with clinical judgement   ADL Overall ADL's : Needs assistance/impaired Eating/Feeding: Set up;Sitting   Grooming: Sitting;Set up   Upper Body Bathing: Set up;Sitting   Lower Body Bathing: Maximal assistance;Sit to/from stand;Cueing for sequencing;Cueing for safety   Upper Body Dressing : Set up;Sitting   Lower Body Dressing: Maximal assistance;Sit to/from stand;Cueing for sequencing;Cueing for safety   Toilet Transfer: Min guard;Cueing for safety;Cueing for sequencing;Squat-pivot Toilet Transfer Details (indicate cue type and reason): bed to chair Toileting- Clothing Manipulation and Hygiene: Maximal  assistance;Sitting/lateral lean         General ADL Comments: Pt has aide that A with her ADL activity as needed.       Vision Patient Visual Report: No change from baseline              Pertinent Vitals/Pain Pain Assessment: No/denies pain     Hand Dominance     Extremity/Trunk Assessment Upper Extremity Assessment Upper Extremity Assessment: Generalized weakness           Communication Communication Communication: No difficulties   Cognition Arousal/Alertness: Awake/alert Behavior During Therapy: WFL for tasks assessed/performed Overall Cognitive Status: Within Functional Limits for tasks assessed                                                Home Living Family/patient expects to be discharged to:: Private residence Living Arrangements: Children Available Help at Discharge: Family;Personal care attendant Type of Home: House Home Access: Ramped entrance     Home Layout: One level     Bathroom Shower/Tub: Occupational psychologist: Standard     Home Equipment: Wheelchair - power;Bedside commode;Transport chair;Shower seat          Prior Functioning/Environment Level of Independence: Needs assistance  Gait / Transfers Assistance Needed: pt able to pivot bed>WC>chair has not walked for years ADL's / Homemaking Assistance Needed: aide assists with bathing, dressing and toileting. Aide and family do all homemaking   Comments: aide 6 days a week with daughter on sundays, daughter is unable to  physically assist        OT Problem List: Decreased strength;Decreased activity tolerance;Impaired balance (sitting and/or standing);Decreased knowledge of use of DME or AE;Obesity      OT Treatment/Interventions: Self-care/ADL training;Patient/family education;Therapeutic exercise;Therapeutic activities;DME and/or AE instruction    OT Goals(Current goals can be found in the care plan section) Acute Rehab OT Goals Patient Stated  Goal: home OT Goal Formulation: With patient Time For Goal Achievement: 07/21/18 Potential to Achieve Goals: Good  OT Frequency: Min 2X/week   Barriers to D/C:               AM-PAC PT "6 Clicks" Daily Activity     Outcome Measure Help from another person eating meals?: None Help from another person taking care of personal grooming?: A Little Help from another person toileting, which includes using toliet, bedpan, or urinal?: A Lot Help from another person bathing (including washing, rinsing, drying)?: A Lot Help from another person to put on and taking off regular upper body clothing?: A Little Help from another person to put on and taking off regular lower body clothing?: A Lot 6 Click Score: 16   End of Session Nurse Communication: Mobility status  Activity Tolerance: Patient tolerated treatment well Patient left: in chair;with call bell/phone within reach  OT Visit Diagnosis: Unsteadiness on feet (R26.81);Muscle weakness (generalized) (M62.81);History of falling (Z91.81)                Time: 2992-4268 OT Time Calculation (min): 13 min Charges:  OT General Charges $OT Visit: 1 Visit OT Evaluation $OT Eval Moderate Complexity: 1 Mod  Kari Baars, OT Acute Rehabilitation Services [phone] Office- [phone]     [name], [name] 07/07/2018, 5:17 PM

## 2018-07-07 NOTE — Progress Notes (Signed)
Nutrition Brief Note  Consult per COPD Gold Protocol.  Wt Readings from Last 15 Encounters:  07/06/18 132.6 kg  06/29/18 131.1 kg  05/24/18 135 kg  04/30/18 135.4 kg  04/27/18 135.2 kg  04/17/18 135.4 kg  02/11/18 135.6 kg  07/17/17 135.6 kg  03/09/17 120.2 kg  09/19/16 129.1 kg  04/24/16 130.1 kg  09/21/15 126.7 kg  04/18/15 115.7 kg  03/01/15 115.7 kg  02/12/15 115.7 kg    Body mass index is 47.91 kg/m. Patient meets criteria for morbid obesity based on current BMI. Current weight is 290 lb and weight on 05/24/18 was 297 lb. This indicates 7 lb weight loss (2.3% body weight) in 1.5 months; not significant for time frame. Skin WDL. Patient with extensive PMH and was admitted for COPD exacerbation.   Current diet order is Heart Healthy/Carb Modified. Labs and medications reviewed. No nutrition interventions warranted at this time. If nutrition issues arise, please re-consult RD.     Jarome Matin, MS, RD, LDN, [facility] Inpatient Clinical Dietitian Pager # [phone] After hours/weekend pager # [phone]

## 2018-07-07 NOTE — Progress Notes (Signed)
PT Cancellation Note  Patient Details Name: Lisa Ewing MRN: [id number] DOB: 12-25-1955   Cancelled Treatment:    Reason Eval/Treat Not Completed: Attempted PT eval. RN braiding pt's hair at this time-pt requested PT to check back. Will check back if schedule permits. Otherwise, will eval on tomorrow. Thanks.    Weston Anna, PT Acute Rehabilitation Services Pager: [phone] Office: [phone]

## 2018-07-07 NOTE — Progress Notes (Signed)
Called report to [place], will transfer pt in wheelchair on room air

## 2018-07-08 ENCOUNTER — Telehealth: Payer: Self-pay | Admitting: Family Medicine

## 2018-07-08 ENCOUNTER — Other Ambulatory Visit: Payer: Self-pay | Admitting: Family Medicine

## 2018-07-08 DIAGNOSIS — J9601 Acute respiratory failure with hypoxia: Secondary | ICD-10-CM

## 2018-07-08 DIAGNOSIS — J441 Chronic obstructive pulmonary disease with (acute) exacerbation: Secondary | ICD-10-CM

## 2018-07-08 DIAGNOSIS — J449 Chronic obstructive pulmonary disease, unspecified: Secondary | ICD-10-CM

## 2018-07-08 DIAGNOSIS — J9602 Acute respiratory failure with hypercapnia: Secondary | ICD-10-CM

## 2018-07-08 LAB — GLUCOSE, CAPILLARY: GLUCOSE-CAPILLARY: 164 mg/dL — AB (ref 70–99)

## 2018-07-08 MED ORDER — PREDNISONE 20 MG PO TABS: ORAL_TABLET | ORAL | 0 refills | Status: AC

## 2018-07-08 MED ORDER — IPRATROPIUM-ALBUTEROL 0.5-2.5 (3) MG/3ML IN SOLN
3.0000 mL | Freq: Three times a day (TID) | RESPIRATORY_TRACT | Status: DC
  Administered 2018-07-08: 3 mL via RESPIRATORY_TRACT
  Filled 2018-07-08: qty 3

## 2018-07-08 MED ORDER — PREDNISONE 10 MG PO TABS: 10.0000 mg | ORAL_TABLET | Freq: Every day | ORAL | 0 refills | Status: DC

## 2018-07-08 NOTE — Progress Notes (Signed)
SATURATION QUALIFICATIONS: (This note is used to comply with regulatory documentation for home oxygen)  Patient Saturations on Room Air at Rest = 83%  Patient Saturations on 2 Liters of oxygen while at rest = 91%  Please briefly explain why patient needs home oxygen:

## 2018-07-08 NOTE — Discharge Summary (Signed)
Physician Discharge Summary  Lisa Ewing AST:[id number] DOB: Jan 13, 1956 DOA: 07/06/2018  PCP: Delsa Grana, PA-C  Admit date: 07/06/2018 Discharge date: 07/08/2018  Admitted From: Home Disposition: Home  Recommendations for Outpatient Follow-up:  1. Follow up with PCP in 1-2 weeks 2. Please obtain BMP/CBC in one week your next doctors visit.  3. Take prednisone taper course as prescribed thereafter continue daily prednisone as previously prescribed.. 4. 2 L nasal cannula home O2  Discharge Condition: Stable CODE STATUS: Full code Diet recommendation: Diabetic  Brief/Interim Summary: 62 year old with history of morbid obesity with BMI greater than 47, asthma, hypothyroidism, obstructive sleep apnea not compliant with her CPAP, smoking came to the hospital with complaints of cough and dyspnea.  She was noted to be hypoxic therapy office therefore sent to the hospital for further evaluation.  In the hospital she was found to have a combination of asthma flare likely secondary to rhinovirus which was positive on respiratory panel and obesity hypoventilation syndrome.  She was initially started on bronchodilators along with IV Solu-Medrol.  Over the course of several days her symptoms started improving and approaching her baseline. On the day of discharge patient was still noted to be hypoxic 83% on room air with quick recovery to 92% on 2 L nasal cannula.  Patient was somewhat adamant about being discharged home therefore home oxygen arrangements were made with prolonged course of steroid taper prescribed.  At this time patient has reached maximum benefit from an hospital stay and stable to be discharged with recommendations as stated above.   Discharge Diagnoses:  Principal Problem:   COPD exacerbation ([place]) Active Problems:   GERD   Hyperlipidemia   Hypothyroid   Diabetes mellitus type 2, uncomplicated (HCC)   Tobacco use   Sleep apnea  Upper respiratory tract infection secondary  to rhinovirus Acute mild to moderate exacerbation of asthma and hypoxia, 83% room air Obesity hypoventilation syndrome, noncompliant with CPAP - We will transition IV Solu-Medrol to prolonged course of oral prednisone taper.  Advised to continue using bronchodilators and montelukast at home.  She needs to follow-up outpatient with pulmonary physician.  She can use over-the-counter remedies as necessary. -Due to hypoxia she still requires 2 L of nasal cannula therefore arrangements made with the assistance of case management.  Patient adamant to go home today therefore will discharge.  Essential hypertension -Resume home medications Lasix and losartan  Diabetes mellitus type 2 Diabetic neuropathy -Continue home regimen for diabetes and gabapentin  Hypothyroidism -Continue Synthroid  Hyperlipidemia -Resume home meds  Patient was on subcutaneous heparin while she was here Full code Stable to be discharged today.  Discharge Instructions   Allergies as of 07/08/2018      Reactions   Bee Venom Anaphylaxis   Latex Shortness Of Breath   Oxycodone-acetaminophen Hives, Shortness Of Breath   Upset Stomach   Penicillins Anaphylaxis   Throat swells Has patient had a PCN reaction causing immediate rash, facial/tongue/throat swelling, SOB or lightheadedness with hypotension: yes Has patient had a PCN reaction causing severe rash involving mucus membranes or skin necrosis: no Has patient had a PCN reaction that required hospitalization- yes at hospital Has patient had a PCN reaction occurring within the last 10 years: no If all of the above answers are "NO", then may proceed with Cephalosporin use.   Rocephin [ceftriaxone Sodium In Dextrose] Swelling   Tingling around lips   Shellfish Allergy Anaphylaxis   Throat swells   Codeine Nausea Only   Metoclopramide Hcl  Doesn't recall type of reaction   Pregabalin Swelling   Sulfonamide Derivatives    Tongue swells   Adhesive [tape] Rash    Other Swelling, Itching, Rash   Almonds. Bananas cause an asthma attack.    Wellbutrin [bupropion] Rash   Hives, red whelps      Medication List    TAKE these medications   ACCU-CHEK AVIVA PLUS test strip Generic drug:  glucose blood USE TO TEST ONCE DAILY.   ACCU-CHEK FASTCLIX LANCETS Misc Use as directed   acyclovir 400 MG tablet Commonly known as:  ZOVIRAX TAKE ONE TABLET BY MOUTH ONCE DAILY.   ADVAIR DISKUS 250-50 MCG/DOSE Aepb Generic drug:  Fluticasone-Salmeterol INHALE 1 PUFF TWICE DAILY; RINSE AFTER USE. What changed:  See the new instructions.   albuterol 108 (90 Base) MCG/ACT inhaler Commonly known as:  PROVENTIL HFA;VENTOLIN HFA USE 2 PUFFS EVERY 4 TO 6 HOURS AS NEEDED FOR SHORTNESS OF BREATH   albuterol (2.5 MG/3ML) 0.083% nebulizer solution Commonly known as:  PROVENTIL Take 3 mLs (2.5 mg total) by nebulization every 4 (four) hours as needed for wheezing or shortness of breath.   atorvastatin 40 MG tablet Commonly known as:  LIPITOR TAKE 1 TABLET BY MOUTH ONCE DAILY AT 6:00 PM. What changed:  See the new instructions.   azithromycin 250 MG tablet Commonly known as:  ZITHROMAX Take 2 tabs (500 mg) PO q d for 1d, then take 1 tab (250 mg) PO q d for day 2-5   calcitRIOL 0.25 MCG capsule Commonly known as:  ROCALTROL TAKE ONE CAPSULE BY MOUTH ONCE DAILY What changed:    how much to take  how to take this  when to take this   cetirizine 10 MG tablet Commonly known as:  ZYRTEC TAKE ONE TABLET BY MOUTH ONCE DAILY.   Colchicine 0.6 MG Caps Take 0.6 mg by mouth 2 (two) times daily.   EPINEPHrine 0.3 mg/0.3 mL Soaj injection Commonly known as:  EPI-PEN INJECT 1 ML INTO THE SKIN AS NEEDED FOR ALLERGIC REACTION.   escitalopram 20 MG tablet Commonly known as:  LEXAPRO TAKE 1 TABLET EVERY DAY.   ezetimibe 10 MG tablet Commonly known as:  ZETIA TAKE ONE TABLET BY MOUTH ONCE DAILY.   fenofibrate 145 MG tablet Commonly known as:  TRICOR TAKE 1  TABLET BY MOUTH ONCE A DAY.   furosemide 20 MG tablet Commonly known as:  LASIX TAKE ONE TABLET BY MOUTH ONCE DAILY.   gabapentin 300 MG capsule Commonly known as:  NEURONTIN Take two capsule in the morning, one capsule in the afternoon  and 2 capsules at bedtime   HYDROcodone-acetaminophen 10-325 MG tablet Commonly known as:  NORCO Take 1 tablet by mouth 3 (three) times daily as needed for moderate pain.   hyoscyamine 0.125 MG SL tablet Commonly known as:  LEVSIN SL PLACE 1 TABLET UNDER THE TONGUE EVERY 4 HOURS AS NEEDED What changed:  See the new instructions.   insulin aspart 100 UNIT/ML FlexPen Commonly known as:  NOVOLOG Before each meal 3 times a day, 140-199 - 2 units, 200-250 - 4 units, 251-299 - 6 units,  300-349 - 8 units,  350 or above 10 units. Insulin PEN if approved, provide syringes and needles if needed.   ipratropium-albuterol 0.5-2.5 (3) MG/3ML Soln Commonly known as:  DUONEB INHALE THE CONTENTS OF ONE VIAL VIA NEBULIZER BY MOUTH FOUR TIMES A DAY AS NEEDED FOR WHEEZING OR SHORTNESS OF BREATH   JANUVIA 100 MG tablet Generic drug:  sitaGLIPtin TAKE ONE TABLET BY MOUTH ONCE DAILY.   levothyroxine 137 MCG tablet Commonly known as:  SYNTHROID, LEVOTHROID TAKE 1 TABLET BEFORE BREAKFAST. What changed:  See the new instructions.   losartan 50 MG tablet Commonly known as:  COZAAR TAKE 1 TABLET BY MOUTH ONCE A DAY.   metFORMIN 1000 MG tablet Commonly known as:  GLUCOPHAGE TAKE 1 TABLET BY MOUTH TWICE DAILY WITH MEALS.   methocarbamol 500 MG tablet Commonly known as:  ROBAXIN TAKE (1) TABLET BY MOUTH TWICE A DAY AS NEEDED.   montelukast 10 MG tablet Commonly known as:  SINGULAIR TAKE (1) TABLET BY MOUTH AT BEDTIME. What changed:  See the new instructions.   NIASPAN 1000 MG CR tablet Generic drug:  niacin TAKE (2) TABLETS BY MOUTH AT BEDTIME. What changed:  See the new instructions.   nystatin 100000 UNIT/ML suspension Commonly known as:   MYCOSTATIN Take 98ml 4 times daily after meals and at bedtime swish and swallow.   nystatin cream Commonly known as:  MYCOSTATIN APPLY 1 APPLICATION TO AFFECTED AREA(S) TWICE DAILY as needed for irritations   pantoprazole 40 MG tablet Commonly known as:  PROTONIX TAKE ONE TABLET BY MOUTH 2 TIMES A DAY BEFORE A MEAL.   predniSONE 10 MG tablet Commonly known as:  DELTASONE Take 1 tablet (10 mg total) by mouth daily. What changed:    medication strength  how much to take  when to take this   predniSONE 20 MG tablet Commonly known as:  DELTASONE Take 2 tablets (40 mg total) by mouth daily with breakfast for 2 days, THEN 1.5 tablets (30 mg total) daily with breakfast for 2 days, THEN 1 tablet (20 mg total) daily with breakfast for 2 days. Start taking on:  07/08/2018 What changed:  You were already taking a medication with the same name, and this prescription was added. Make sure you understand how and when to take each.   raloxifene 60 MG tablet Commonly known as:  EVISTA TAKE ONE TABLET BY MOUTH ONCE DAILY.   sucralfate 1 g tablet Commonly known as:  CARAFATE TAKE 1g TABLET BY MOUTH 4 TIMES A DAY WITH MEALS AND AT BEDTIME as needed for stomach coating   traZODone 100 MG tablet Commonly known as:  DESYREL Take 1 tablet (100 mg total) by mouth at bedtime.   triamcinolone 0.025 % cream Commonly known as:  KENALOG APPLY 1 APPLICATION TOPICALLY TO AFFECTED AREA(S) TWICE DAILY as needed   VESICARE 5 MG tablet Generic drug:  solifenacin TAKE ONE TABLET BY MOUTH ONCE DAILY.   VOLTAREN 1 % Gel Generic drug:  diclofenac sodium APPLY 2 GRAMS TO AFFECTED AREA FOUR TIMES A DAY.      Follow-up Information    Delsa Grana, PA-C. Schedule an appointment as soon as possible for a visit in 1 week(s).   Specialty:  Family Medicine Contact information: [address] [place] [address] [phone]        Arnoldo Lenis, MD .   Specialty:  Cardiology Contact  information: [place] [address] [phone]          Allergies  Allergen Reactions  . Bee Venom Anaphylaxis  . Latex Shortness Of Breath  . Oxycodone-Acetaminophen Hives and Shortness Of Breath    Upset Stomach  . Penicillins Anaphylaxis    Throat swells Has patient had a PCN reaction causing immediate rash, facial/tongue/throat swelling, SOB or lightheadedness with hypotension: yes Has patient had a PCN reaction causing  severe rash involving mucus membranes or skin necrosis: no Has patient had a PCN reaction that required hospitalization- yes at hospital Has patient had a PCN reaction occurring within the last 10 years: no If all of the above answers are "NO", then may proceed with Cephalosporin use.   . Rocephin [Ceftriaxone Sodium In Dextrose] Swelling    Tingling around lips  . Shellfish Allergy Anaphylaxis    Throat swells   . Codeine Nausea Only  . Metoclopramide Hcl     Doesn't recall type of reaction  . Pregabalin Swelling  . Sulfonamide Derivatives     Tongue swells   . Adhesive [Tape] Rash  . Other Swelling, Itching and Rash    Almonds. Bananas cause an asthma attack.   . Wellbutrin [Bupropion] Rash    Hives, red whelps    You were cared for by a hospitalist during your hospital stay. If you have any questions about your discharge medications or the care you received while you were in the hospital after you are discharged, you can call the unit and asked to speak with the hospitalist on call if the hospitalist that took care of you is not available. Once you are discharged, your primary care physician will handle any further medical issues. Please note that no refills for any discharge medications will be authorized once you are discharged, as it is imperative that you return to your primary care physician (or establish a relationship with a primary care physician if you do not have one) for your aftercare needs so that they can reassess your  need for medications and monitor your lab values.  Consultations:  None   Procedures/Studies: Dg Chest 2 View  Result Date: 07/06/2018 CLINICAL DATA:  Shortness of breath. EXAM: CHEST - 2 VIEW COMPARISON:  Radiograph of April 13, 2018. FINDINGS: Stable cardiomegaly. No pneumothorax or pleural effusion is noted. Both lungs are clear. The visualized skeletal structures are unremarkable. IMPRESSION: No active cardiopulmonary disease. Electronically Signed   By: Marijo Conception, M.D.   On: 07/06/2018 12:38      Subjective: Patient feels better and wishes to go home today.  She still hypoxic saturating 83% on room air with quick recovery to 92% on 2 L nasal cannula.  She states she is bedbound at home and does not ambulate much at all therefore will rather go home on home oxygen for now.  Otherwise no other complaints.  General = no fevers, chills, dizziness, malaise, fatigue HEENT/EYES = negative for pain, redness, loss of vision, double vision, blurred vision, loss of hearing, sore throat, hoarseness, dysphagia Cardiovascular= negative for chest pain, palpitation, murmurs, lower extremity swelling Respiratory/lungs= negative for shortness of breath, cough, hemoptysis, wheezing, mucus production Gastrointestinal= negative for nausea, vomiting,, abdominal pain, melena, hematemesis Genitourinary= negative for Dysuria, Hematuria, Change in Urinary Frequency MSK = Negative for arthralgia, myalgias, Back Pain, Joint swelling  Neurology= Negative for headache, seizures, numbness, tingling  Psychiatry= Negative for anxiety, depression, suicidal and homocidal ideation Allergy/Immunology= Medication/Food allergy as listed  Skin= Negative for Rash, lesions, ulcers, itching   Discharge Exam: Vitals:   07/08/18 0747 07/08/18 0940  BP:  133/63  Pulse:  91  Resp:  20  Temp:    SpO2: (!) 83% 90%   Vitals:   07/07/18 2028 07/08/18 0511 07/08/18 0747 07/08/18 0940  BP: (!) 97/38 139/76  133/63   Pulse: 100 99  91  Resp: 16 20  20   Temp: 98.1 F (36.7 C) 97.8 F (36.6 C)  TempSrc: Oral     SpO2: 92% (!) 88% (!) 83% 90%  Weight:        General: Pt is alert, awake, not in acute distress; 2L [place] Cardiovascular: RRR, S1/S2 +, no rubs, no gallops Respiratory: Slightly diminished breath sounds diffusely; no wheezing heard. Abdominal: Soft, NT, ND, bowel sounds + Extremities: no edema, no cyanosis    The results of significant diagnostics from this hospitalization (including imaging, microbiology, ancillary and laboratory) are listed below for reference.     Microbiology: Recent Results (from the past 240 hour(s))  Respiratory Panel by PCR     Status: Abnormal   Collection Time: 07/06/18  3:20 PM  Result Value Ref Range Status   Adenovirus NOT DETECTED NOT DETECTED Final   Coronavirus 229E NOT DETECTED NOT DETECTED Final   Coronavirus HKU1 NOT DETECTED NOT DETECTED Final   Coronavirus NL63 NOT DETECTED NOT DETECTED Final   Coronavirus OC43 NOT DETECTED NOT DETECTED Final   Metapneumovirus NOT DETECTED NOT DETECTED Final   Rhinovirus / Enterovirus DETECTED (A) NOT DETECTED Final   Influenza A NOT DETECTED NOT DETECTED Final   Influenza B NOT DETECTED NOT DETECTED Final   Parainfluenza Virus 1 NOT DETECTED NOT DETECTED Final   Parainfluenza Virus 2 NOT DETECTED NOT DETECTED Final   Parainfluenza Virus 3 NOT DETECTED NOT DETECTED Final   Parainfluenza Virus 4 NOT DETECTED NOT DETECTED Final   Respiratory Syncytial Virus NOT DETECTED NOT DETECTED Final   Bordetella pertussis NOT DETECTED NOT DETECTED Final   Chlamydophila pneumoniae NOT DETECTED NOT DETECTED Final   Mycoplasma pneumoniae NOT DETECTED NOT DETECTED Final    Comment: Performed at [facility], [address], [place], [place] [address]  MRSA PCR Screening     Status: None   Collection Time: 07/06/18 10:19 PM  Result Value Ref Range Status   MRSA by PCR NEGATIVE NEGATIVE Final    Comment:        The  GeneXpert MRSA Assay (FDA approved for NASAL specimens only), is one component of a comprehensive MRSA colonization surveillance program. It is not intended to diagnose MRSA infection nor to guide or monitor treatment for MRSA infections. Performed at [facility], [place] [address], [place], [place] [address]      Labs: BNP (last 3 results) Recent Labs    04/11/18 2242 07/06/18 1239  BNP 27.0 25.0   Basic Metabolic Panel: Recent Labs  Lab 07/06/18 1239  NA 136  K 4.1  CL 98  CO2 28  GLUCOSE 198*  BUN 23  CREATININE 1.02*  CALCIUM 8.4*   Liver Function Tests: Recent Labs  Lab 07/06/18 1239  AST 27  ALT 25  ALKPHOS 65  BILITOT 0.5  PROT 7.0  ALBUMIN 3.8   No results for input(s): LIPASE, AMYLASE in the last 168 hours. No results for input(s): AMMONIA in the last 168 hours. CBC: Recent Labs  Lab 07/06/18 1239 07/07/18 0059  WBC 10.6* 10.1  NEUTROABS 5.3 7.7  HGB 12.2 11.4*  HCT 43.4 39.8  MCV 82.8 81.6  PLT 328 304   Cardiac Enzymes: Recent Labs  Lab 07/06/18 1239 07/06/18 1917 07/07/18 0059  TROPONINI <0.03 <0.03 <0.03   BNP: Invalid input(s): POCBNP CBG: Recent Labs  Lab 07/07/18 0748 07/07/18 1202 07/07/18 1618 07/07/18 2133 07/08/18 0753  GLUCAP 224* 230* 200* 164* 164*   D-Dimer No results for input(s): DDIMER in the last 72 hours. Hgb A1c No results for input(s): HGBA1C in the  last 72 hours. Lipid Profile No results for input(s): CHOL, HDL, LDLCALC, TRIG, CHOLHDL, LDLDIRECT in the last 72 hours. Thyroid function studies No results for input(s): TSH, T4TOTAL, T3FREE, THYROIDAB in the last 72 hours.  Invalid input(s): FREET3 Anemia work up No results for input(s): VITAMINB12, FOLATE, FERRITIN, TIBC, IRON, RETICCTPCT in the last 72 hours. Urinalysis    Component Value Date/Time   COLORURINE AMBER (A) 04/12/2018 1235   APPEARANCEUR CLOUDY (A) 04/12/2018 1235   LABSPEC >1.046 (H) 04/12/2018 1235   PHURINE  5.0 04/12/2018 1235   GLUCOSEU NEGATIVE 04/12/2018 1235   HGBUR MODERATE (A) 04/12/2018 1235   BILIRUBINUR NEGATIVE 04/12/2018 1235   KETONESUR NEGATIVE 04/12/2018 1235   PROTEINUR 100 (A) 04/12/2018 1235   UROBILINOGEN 0.2 03/14/2015 1026   NITRITE POSITIVE (A) 04/12/2018 1235   LEUKOCYTESUR LARGE (A) 04/12/2018 1235   Sepsis Labs Invalid input(s): PROCALCITONIN,  WBC,  LACTICIDVEN Microbiology Recent Results (from the past 240 hour(s))  Respiratory Panel by PCR     Status: Abnormal   Collection Time: 07/06/18  3:20 PM  Result Value Ref Range Status   Adenovirus NOT DETECTED NOT DETECTED Final   Coronavirus 229E NOT DETECTED NOT DETECTED Final   Coronavirus HKU1 NOT DETECTED NOT DETECTED Final   Coronavirus NL63 NOT DETECTED NOT DETECTED Final   Coronavirus OC43 NOT DETECTED NOT DETECTED Final   Metapneumovirus NOT DETECTED NOT DETECTED Final   Rhinovirus / Enterovirus DETECTED (A) NOT DETECTED Final   Influenza A NOT DETECTED NOT DETECTED Final   Influenza B NOT DETECTED NOT DETECTED Final   Parainfluenza Virus 1 NOT DETECTED NOT DETECTED Final   Parainfluenza Virus 2 NOT DETECTED NOT DETECTED Final   Parainfluenza Virus 3 NOT DETECTED NOT DETECTED Final   Parainfluenza Virus 4 NOT DETECTED NOT DETECTED Final   Respiratory Syncytial Virus NOT DETECTED NOT DETECTED Final   Bordetella pertussis NOT DETECTED NOT DETECTED Final   Chlamydophila pneumoniae NOT DETECTED NOT DETECTED Final   Mycoplasma pneumoniae NOT DETECTED NOT DETECTED Final    Comment: Performed at [facility], [place]. [address] [place], [place] [address]  MRSA PCR Screening     Status: None   Collection Time: 07/06/18 10:19 PM  Result Value Ref Range Status   MRSA by PCR NEGATIVE NEGATIVE Final    Comment:        The GeneXpert MRSA Assay (FDA approved for NASAL specimens only), is one component of a comprehensive MRSA colonization surveillance program. It is not intended to diagnose MRSA infection  nor to guide or monitor treatment for MRSA infections. Performed at [facility], [place] [address], [place], [place] [address]      Time coordinating discharge:  I have spent 35 minutes face to face with the patient and on the ward discussing the patients care, assessment, plan and disposition with other care givers. >50% of the time was devoted counseling the patient about the risks and benefits of treatment/Discharge disposition and coordinating care.   SIGNED:   Damita Lack, MD  [facility] 07/08/2018, 11:42 AM Pager   If 7PM-7AM, please contact night-coverage [email or web address] Password [email or web address]

## 2018-07-08 NOTE — Progress Notes (Signed)
07/08/18  1045 Per pt she is not able to walk and has been using a wheelchair for 71yrs. Asked if she could take a fews steps towards the door and patient refused stating she can not bear weight on rt leg.

## 2018-07-08 NOTE — Progress Notes (Signed)
07/08/18  1230  Patient family here to transport her home. Patient refused to wait [facility] O2 from [facility].

## 2018-07-08 NOTE — Telephone Encounter (Signed)
Pt wants nebulizer machine called in to [employer]

## 2018-07-08 NOTE — Telephone Encounter (Signed)
Ok to order 

## 2018-07-08 NOTE — Evaluation (Signed)
Physical Therapy One Time Evaluation Patient Details Name: Lisa Ewing MRN: [id number] DOB: May 13, 1956 Today's Date: 07/08/2018   History of Present Illness  62 year old with history of morbid obesity with BMI greater than 47, asthma, hypothyroidism, obstructive sleep apnea not compliant with her CPAP, smoking and admitted for Acute mild to moderate exacerbation of asthma and hypoxia and Upper respiratory tract infection secondary to rhinovirus  Clinical Impression  Patient evaluated by Physical Therapy with no further acute PT needs identified. All education has been completed and the patient has no further questions.  Pt assisted OOB to recliner and performed her transfer at baseline. Pt typically uses power w/c for mobility.  Pt requiring supplemental oxygen at this time.  Discussed CPAP use at home and pt reports noncompliance due to "lining" of mask.  Pt encouraged to seek assist finding more compatible mask.  Pt reports d/c home today. See below for any follow-up Physical Therapy or equipment needs. PT is signing off. Thank you for this referral.  SATURATION QUALIFICATIONS: (This note is used to comply with regulatory documentation for home oxygen)  Patient Saturations on Room Air at Rest = 89%  Patient Saturations on Room Air while trasferring = 86%  Patient Saturations on 2 Liters of oxygen while transferring = 91%  Please briefly explain why patient needs home oxygen: to improve oxygen saturations with physical exertion such as transfers     Follow Up Recommendations No PT follow up    Equipment Recommendations  None recommended by PT    Recommendations for Other Services       Precautions / Restrictions Precautions Precautions: Fall Restrictions Weight Bearing Restrictions: No      Mobility  Bed Mobility Overal bed mobility: Modified Independent                Transfers Overall transfer level: Needs assistance Equipment used: None Transfers: Sit  to/from [facility] Sit to Stand: Min guard Stand pivot transfers: Min guard       General transfer comment: pt uses bed rail and armrests to self support, does not put weight on R LE (baseline), SPO2 dropped to 86% on 2L O2 upon completing transfer  Ambulation/Gait             General Gait Details: nonambulatory for years  Stairs            Wheelchair Mobility    Modified Rankin (Stroke Patients Only)       Balance                                             Pertinent Vitals/Pain Pain Assessment: No/denies pain    Home Living Family/patient expects to be discharged to:: Private residence Living Arrangements: Children Available Help at Discharge: Family;Personal care attendant Type of Home: House Home Access: Ramped entrance     [place]: One [place]: Wheelchair - power;Bedside commode;Transport chair;Shower seat      Prior Function Level of Independence: Needs assistance   Gait / Transfers Assistance Needed: pt able to pivot bed>WC>chair has not walked for years  ADL's / Homemaking Assistance Needed: aide assists with bathing, dressing and toileting. 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       Communication   Communication: No difficulties  Cognition Arousal/Alertness: Awake/alert Behavior During Therapy: WFL for tasks assessed/performed Overall Cognitive Status: Within Functional Limits for tasks assessed                                        General Comments      Exercises     Assessment/Plan    PT Assessment Patent does not need any  further PT services  PT Problem List         PT Treatment Interventions      PT Goals (Current goals can be found in the Care Plan section)  Acute Rehab PT Goals PT Goal Formulation: All assessment and education complete, DC therapy    Frequency     Barriers to discharge        Co-evaluation               AM-PAC PT "6 Clicks" Daily Activity  Outcome Measure Difficulty turning over in bed (including adjusting bedclothes, sheets and blankets)?: None Difficulty moving from lying on back to sitting on the side of the bed? : None Difficulty sitting down on and standing up from a chair with arms (e.g., wheelchair, bedside commode, etc,.)?: A Little Help needed moving to and from a bed to chair (including a wheelchair)?: A Little Help needed walking in hospital room?: Total Help needed climbing 3-5 steps with a railing? : Total 6 Click Score: 16    End of Session Equipment Utilized During Treatment: Oxygen Activity Tolerance: Patient tolerated treatment well Patient left: in chair;with call bell/phone within reach;with nursing/sitter in room Nurse Communication: Mobility status PT Visit Diagnosis: Other abnormalities of gait and mobility (R26.89)    Time: 3112-1624 PT Time Calculation (min) (ACUTE ONLY): 15 min   Charges:   PT Evaluation $PT Eval Low Complexity: [place], PT, DPT Acute Rehabilitation Services Office: [phone] Pager: [phone]  Trena Platt 07/08/2018, 12:46 PM

## 2018-07-08 NOTE — Care Management Note (Signed)
Case Management Note  Patient Details  Name: Lisa Ewing MRN: [id number] Date of Birth: 10/01/1955  Subjective/Objective:                  Discharge planning   Action/Plan: Home 02-advanced hhc/dme notified/requested values to be entered into the chart by floor RN. Will follow up.  Expected Discharge Date:  07/08/18               Expected Discharge Plan:  [place]  In-House Referral:     Discharge planning Services  CM Consult  Post Acute Care Choice:  Durable Medical Equipment Choice offered to:  NA  DME Arranged:  Oxygen DME Agency:  [place]:    [facility] Agency:     Status of Service:  Completed, signed off  If discussed at [place] of Stay Meetings, dates discussed:    Additional Comments:  Leeroy Cha, RN 07/08/2018, 10:31 AM

## 2018-07-08 NOTE — Progress Notes (Signed)
07/08/18  1230  Reviewed discharge instructions with patient. Patient verbalized understanding of discharge instructions. Copy of discharge instructions and prescription given to patient.  Patient advised to contact company that brought her CPAP and have them to refit and get her a new mask. Patient advised she needs to start wearing her CPAP nightly. Pt stated the mask was uncomfortable, big, and the rubber was causing her not to wear CPAP. Patient states she will call company and have them come out and refit her for her CPAP.

## 2018-07-09 MED ORDER — NEBULIZER/TUBING/MOUTHPIECE KIT: PACK | 0 refills | Status: DC

## 2018-07-09 MED ORDER — NEBULIZER/TUBING/MOUTHPIECE KIT: PACK | 0 refills | Status: AC

## 2018-07-09 NOTE — Telephone Encounter (Signed)
  COPD exacerbation ([place])  Acute respiratory failure with hypoxia and hypercapnia (HCC)  Asthma  Pt recently admitted for respiratory distress with hypoxia, 07/06/18-07/08/18  For the following     Acute mild to moderate exacerbation of asthma and hypoxia, 83% room air Obesity hypoventilation syndrome, noncompliant with CPAP - We will transition IV Solu-Medrol to prolonged course of oral prednisone taper.  Advised to continue using bronchodilators and montelukast at home.  She needs to follow-up outpatient with pulmonary physician.  She can use over-the-counter remedies as necessary. -Due to hypoxia she still requires 2 L of nasal cannula therefore arrangements made with the assistance of case management.  Patient adamant to go home today therefore will discharge.  She wsa seen in clinic that day for 1 week of URI illness with worsening respiratory sx, failed in clinic nebs (multiple) and IM steroids, so was sent to the ER via EMS.  Copied note from that OV: Patient arrived to office in wheelchair. O2 on RA at rest: 88-90% O2 on RA at rest after breathing tx: 73% O2 on RA at rest after 2cd breathing tx: 83% O2 on 2L/min via [place] at rest: 92% Electronically signed by Six, Christina H, LPN at 75/01/1832 58:25 AM   Pt has arrived home and requires new nebulizer machine, hers is not working in is decades old (per pt).  She is on home 02, has frequent, recurrent exacerbations for diagnosis listed above and requires frequent nebulizer tx.  Delsa Grana, PA-C 07/09/18 5:16 PM

## 2018-07-09 NOTE — Progress Notes (Signed)
Patient ID: Lisa Ewing, female    DOB: [birth year], 62 y.o.   MRN: [id number]  PCP: Delsa Grana, PA-C  Chief Complaint  Patient presents with  . Hospital F/U    respiratory distress    Subjective:   Lisa Ewing is a 62 y.o. female, presents to clinic with CC of acute on chronic respiratory failure, will up from hospital admission from 07/06/18-07/08/18.  ER she was diagnosed with upper respiratory tract infection secondary to rhinovirus with acute mild to moderate exacerbation of asthma and hypoxemia, 83% on room air, she also has obesity hypoventilation syndrome, is noncompliant with CPAP.  She was treated with IV and then oral steroids, encouraged to continue to use bronchodilators and montelukast, encouraged to follow-up outpatient with pulmonary which today she states that she does not have a lung doctor.  Did continue to be hypoxic requiring 2 L via nasal cannula so oxygen was arranged for her to take at home, today she states that she is not using her oxygen, does not like it.  Arriving in the clinic she was 88% on room air.  After she was discharged from the hospital last week I did send documentation and prescription for a new nebulizer machine with tubing and neb medicines she has been using this and she reports that her breathing is much better so much that so that she believes she does not need the oxygen.  She continues to deny that she has COPD.  She is continuing to smoke at least 1 pack/day.   She continues to state that she is trying to cut down ever while she says this the aide was with her as nodding her head in disagreement.  Her maintenance meds includes Advair 1 puff twice daily, albuterol inhaler and albuterol nebulizers every 4-6 hours as needed for shortness of breath.  She is taking montelukast asked. She was given a 8-day steroid taper prednisone 20 mg tablets starting at 2 days of 40 mg and 2 days of 30 mg and she should have completed that yesterday or today.   Believes that she has finished the steroids.  She states that she would tolerated them well but did have to administer insulin shots for hypoglycemia for a few days but it has slowly returned to her baseline sugars.  She is nonambulatory, in a wheelchair, does not do any daily weights, she denies any known fluid retention, lower extremity edema, she denies any chest pain, she feels that her breathing, cough and wheeze are all at her baseline.  Had a fever, sweats, chills.  When she does go outside she states that when she has worsening wheeze , dates that her breathing is always worse this time a year.  She believes that seasonal allergies and visitation from family who are ill as what cause her to become hypoxic.  She is trying to discuss to have her oxygen removed from her home however we have encouraged her to keep it and use it to simply keep her oxygen above 90%.   Patient Active Problem List   Diagnosis Date Noted  . Acute respiratory failure with hypoxia and hypercapnia ([place]) 04/13/2018  . Acute metabolic encephalopathy 29/52/8413  . Nausea vomiting and diarrhea   . Pericardial tamponade   . Pericardial effusion 04/12/2018  . Sleep apnea 04/12/2018  . Acute lower UTI 04/12/2018  . Abdominal pain 04/12/2018  . Acute gastroenteritis 04/12/2018  . Intractable vomiting with nausea 04/12/2018  . Obesity, Class III,  BMI 40-49.9 (morbid obesity) ([place]) 04/12/2018  . Dehydration 04/12/2018  . Fatty liver 05/14/2017  . Upper abdominal pain 05/13/2017  . RUQ pain 05/13/2017  . Iron deficiency anemia 05/13/2017  . Tobacco use 11/14/2016  . Diabetes mellitus type 2, uncomplicated ([place]) 41/66/0630  . Adhesive bursitis of left shoulder 03/04/2016  . Arthritis 03/01/2015  . Mucosal abnormality of stomach   . Mucosal abnormality of esophagus   . Vitamin D deficiency 01/02/2015  . Postlaminectomy syndrome, lumbar region 12/29/2014  . Chronic lumbar radiculopathy 12/29/2014  . Dysphagia,  pharyngoesophageal phase 07/18/2014  . Dyspepsia 07/13/2014  . Subacromial impingement 04/05/2014  . Trochanteric bursitis of left hip 12/15/2013  . COPD exacerbation ([place]) 10/12/2013  . Tachycardia 10/12/2013  . Hypothyroid 07/11/2013  . Allergic rhinitis   . Asthma   . COPD (chronic obstructive pulmonary disease) ([place])   . Depression   . Hyperlipidemia   . Osteoporosis   . Smoker   . Urinary tract infection 06/02/2013  . Leukocytosis, unspecified 05/28/2013  . IBS (irritable bowel syndrome) 05/11/2013  . Diarrhea 03/02/2013  . Abdominal pain, chronic, right upper quadrant 03/02/2013  . Gait disorder 01/14/2012  . Chronic leg pain 12/19/2011  . Chronic low back pain 12/19/2011  . Chronic neck pain 12/19/2011  . Shoulder pain, bilateral 12/19/2011  . Thoracic back pain 12/19/2011  . ABDOMINAL PAIN, GENERALIZED 02/06/2010  . GERD 11/27/2009  . CONSTIPATION, CHRONIC 11/27/2009  . History of colonic polyps 11/27/2009  . [place] ALLERGY 11/27/2009  . Allergy to latex 11/27/2009    Current Meds  Medication Sig  . ACCU-CHEK AVIVA PLUS test strip USE TO TEST ONCE DAILY.  Marland Kitchen ACCU-CHEK FASTCLIX LANCETS MISC Use as directed  . acyclovir (ZOVIRAX) 400 MG tablet TAKE ONE TABLET BY MOUTH ONCE DAILY.  Marland Kitchen ADVAIR DISKUS 250-50 MCG/DOSE AEPB INHALE 1 PUFF TWICE DAILY; RINSE AFTER USE. (Patient taking differently: Inhale 1 puff into the lungs 2 (two) times daily. )  . albuterol (PROVENTIL HFA) 108 (90 Base) MCG/ACT inhaler USE 2 PUFFS EVERY 4 TO 6 HOURS AS NEEDED FOR SHORTNESS OF BREATH  . albuterol (PROVENTIL) (2.5 MG/3ML) 0.083% nebulizer solution Take 3 mLs (2.5 mg total) by nebulization every 4 (four) hours as needed for wheezing or shortness of breath.  Marland Kitchen atorvastatin (LIPITOR) 40 MG tablet TAKE 1 TABLET BY MOUTH ONCE DAILY AT 6:00 PM. (Patient taking differently: Take 40 mg by mouth daily at 6 PM. )  . calcitRIOL (ROCALTROL) 0.25 MCG capsule TAKE ONE CAPSULE BY MOUTH ONCE DAILY (Patient  taking differently: TAKE 0.69mg CAPSULE BY MOUTH ONCE DAILY)  . cetirizine (ZYRTEC) 10 MG tablet TAKE ONE TABLET BY MOUTH ONCE DAILY.  .Marland KitchenColchicine (MITIGARE) 0.6 MG CAPS Take 0.6 mg by mouth 2 (two) times daily.  .Marland KitchenEPINEPHrine 0.3 mg/0.3 mL IJ SOAJ injection INJECT 1 ML INTO THE SKIN AS NEEDED FOR ALLERGIC REACTION.  .Marland Kitchenescitalopram (LEXAPRO) 20 MG tablet TAKE 1 TABLET EVERY DAY.  .Marland Kitchenezetimibe (ZETIA) 10 MG tablet TAKE ONE TABLET BY MOUTH ONCE DAILY.  . fenofibrate (TRICOR) 145 MG tablet TAKE 1 TABLET BY MOUTH ONCE A DAY.  . furosemide (LASIX) 20 MG tablet TAKE ONE TABLET BY MOUTH ONCE DAILY.  .Marland Kitchengabapentin (NEURONTIN) 300 MG capsule Take two capsule in the morning, one capsule in the afternoon  and 2 capsules at bedtime  . HYDROcodone-acetaminophen (NORCO) 10-325 MG tablet Take 1 tablet by mouth 3 (three) times daily as needed for moderate pain.  . hyoscyamine (LEVSIN SL) 0.125 MG  SL tablet PLACE 1 TABLET UNDER THE TONGUE EVERY 4 HOURS AS NEEDED (Patient taking differently: Take 0.125 mg by mouth every 4 (four) hours as needed for cramping. )  . insulin aspart (NOVOLOG) 100 UNIT/ML FlexPen Before each meal 3 times a day, 140-199 - 2 units, 200-250 - 4 units, 251-299 - 6 units,  300-349 - 8 units,  350 or above 10 units. Insulin PEN if approved, provide syringes and needles if needed.  Marland Kitchen ipratropium-albuterol (DUONEB) 0.5-2.5 (3) MG/3ML SOLN INHALE THE CONTENTS OF ONE VIAL VIA NEBULIZER BY MOUTH FOUR TIMES A DAY AS NEEDED FOR WHEEZING OR SHORTNESS OF BREATH  . JANUVIA 100 MG tablet TAKE ONE TABLET BY MOUTH ONCE DAILY.  Marland Kitchen levothyroxine (SYNTHROID, LEVOTHROID) 137 MCG tablet TAKE 1 TABLET BEFORE BREAKFAST. (Patient taking differently: Take 137 mcg by mouth daily before breakfast. )  . losartan (COZAAR) 50 MG tablet TAKE 1 TABLET BY MOUTH ONCE A DAY.  . metFORMIN (GLUCOPHAGE) 1000 MG tablet TAKE 1 TABLET BY MOUTH TWICE DAILY WITH MEALS.  . methocarbamol (ROBAXIN) 500 MG tablet TAKE (1) TABLET BY MOUTH  TWICE A DAY AS NEEDED.  Marland Kitchen montelukast (SINGULAIR) 10 MG tablet TAKE (1) TABLET BY MOUTH AT BEDTIME. (Patient taking differently: Take 10 mg by mouth at bedtime. )  . NIASPAN 1000 MG CR tablet TAKE (2) TABLETS BY MOUTH AT BEDTIME. (Patient taking differently: Take 2,000 mg by mouth at bedtime. )  . nystatin (MYCOSTATIN) 100000 UNIT/ML suspension Take 66m 4 times daily after meals and at bedtime swish and swallow.  . nystatin cream (MYCOSTATIN) APPLY 1 APPLICATION TO AFFECTED AREA(S) TWICE DAILY as needed for irritations  . pantoprazole (PROTONIX) 40 MG tablet TAKE ONE TABLET BY MOUTH 2 TIMES A DAY BEFORE A MEAL.  . predniSONE (DELTASONE) 10 MG tablet Take 1 tablet (10 mg total) by mouth daily.  . predniSONE (DELTASONE) 20 MG tablet TAKE 2 TABLETS BY MOUTH EACH MORNING WITH BREAKFAST.  . raloxifene (EVISTA) 60 MG tablet TAKE ONE TABLET BY MOUTH ONCE DAILY.  .Marland KitchenRespiratory Therapy Supplies (NEBULIZER/TUBING/MOUTHPIECE) KIT Disp one nebulizer machine, tubing set and mouthpiece kit  . sucralfate (CARAFATE) 1 g tablet TAKE 1g TABLET BY MOUTH 4 TIMES A DAY WITH MEALS AND AT BEDTIME as needed for stomach coating  . traZODone (DESYREL) 100 MG tablet Take 1 tablet (100 mg total) by mouth at bedtime.  . triamcinolone (KENALOG) 0.025 % cream APPLY 1 APPLICATION TOPICALLY TO AFFECTED AREA(S) TWICE DAILY as needed  . VESICARE 5 MG tablet TAKE ONE TABLET BY MOUTH ONCE DAILY.  . VOLTAREN 1 % GEL APPLY 2 GRAMS TO AFFECTED AREA FOUR TIMES A DAY.     Review of Systems  Constitutional: Negative.   HENT: Negative.   Eyes: Negative.   Respiratory: Negative.   Cardiovascular: Negative.   Gastrointestinal: Negative.   Endocrine: Negative.   Genitourinary: Negative.   Musculoskeletal: Negative.   Skin: Negative.   Allergic/Immunologic: Negative.   Neurological: Negative.   Hematological: Negative.   Psychiatric/Behavioral: Negative.    10 Systems reviewed and are negative for acute change except as noted in  the HPI.     Objective:    Vitals:   07/16/18 1219  BP: 126/76  Pulse: (!) 104  Resp: 16  Temp: 98.1 F (36.7 C)  TempSrc: Oral  SpO2: 91%      Physical Exam  Constitutional: She appears well-developed. No distress.  Obese, chronically ill-appearing female, mild tachypnea, NAD   HENT:  Head: Normocephalic and atraumatic.  Nose: Nose normal.  Mouth/Throat: Oropharynx is clear and moist.  Eyes: Pupils are equal, round, and reactive to light. Conjunctivae are normal. Right eye exhibits no discharge. Left eye exhibits no discharge.  Neck: Normal range of motion. No tracheal deviation present.  Cardiovascular: Regular rhythm. Tachycardia present. Exam reveals distant heart sounds. Exam reveals no gallop and no friction rub.  No murmur heard. Pulses:      Radial pulses are 2+ on the right side, and 2+ on the left side.  Pulmonary/Chest: No accessory muscle usage or stridor. Tachypnea (mild) noted. No respiratory distress. She has decreased breath sounds in the right lower field and the left lower field. She has wheezes (mild exp). She has no rales.  Abdominal: Soft. Bowel sounds are normal. She exhibits no distension. There is no tenderness.  Musculoskeletal: Normal range of motion.  Neurological: She is alert. She exhibits normal muscle tone. Coordination normal.  Skin: Skin is warm and dry. Capillary refill takes less than 2 seconds. No rash noted. She is not diaphoretic.  Psychiatric: She has a normal mood and affect. Her behavior is normal.  Nursing note and vitals reviewed.         Assessment & Plan:   62 y/o female, with complicated med hx presents for follow up after hospitalization for respiratory distress, acute on chronic respiratory failure with hypoxia, secondary to URI and allergies exacerbating COPD/asthma.  She has multiple other comorbidities, and has had several office visits and 2 recent hospitalizations.  She is here for follow up, feels better.     ICD-10-CM   1. Hospital discharge follow-up Z09 Ambulatory referral to Pulmonology   Discharge summary, hospital course, labs, medications all reviewed   2. COPD exacerbation ([place]) J44.1 Ambulatory referral to Pulmonology   Improved, there were diminished, continue nebs as needed   3. Smoker F17.200 Ambulatory referral to Pulmonology    nicotine (NICODERM CQ - DOSED IN MG/24 HOURS) 14 mg/24hr patch   See below   4. Persistent asthma with acute exacerbation, unspecified asthma severity J45.901 Ambulatory referral to Pulmonology   Patient endorses asthma rather than COPD, avoid triggers, continue montelukast   5. Seasonal allergies J30.2 Ambulatory referral to Pulmonology   May need allergist evaluation?  will proceed with pulm first and see what they recommend - montelukast, OTC antihistamine, avoid triggers   Continue maintenance medicines for asthma/COPD, continue O2 use 2L via [place] PRN to maintain SpO2 > 90% - needs pulmonology evaluation -she has likely multifactorial acute on chronic respiratory failure, she is not my patient but I have seen her several times for respiratory distress and hypoxia and have sent her to the hospital multiple times.  Slightly shocked that she does not have a pulmonologist.  She does have very complicated heart history as well with history of cardiac tamponade and pericardial effusion, heart failure.  She strangely denies possibility of COPD and is more emphatic that its asthma, either way feel it needs a formal evaluation to assist in optimal management of her lung disease, she does already have known OHS and OSA, continues to smoke, is morbidly obese.  Smoking cessation instruction/counseling given:  counseled patient on the dangers of tobacco use, advised patient to stop smoking, and reviewed strategies to maximize success   She will try patches and to cut down   Advised to not smoke while using oxygen.  Encouraged to continue maintenance inhaler and use  nebulizer treatments every 4 hours as needed and if her breathing becomes acutely worse  she does need to be seen to prevent another exacerbation.  Delsa Grana, PA-C 07/16/18 5:36 PM

## 2018-07-12 ENCOUNTER — Telehealth: Payer: Self-pay | Admitting: *Deleted

## 2018-07-12 NOTE — Telephone Encounter (Signed)
Received call from [place], [facility] SN with [facility].   Requested VO to resume HH SN since patient hospitalization. Requested HH SN orders 1x weekly x1, 2x weekly x2 weeks, then 1x weekly x3 weeks for respiratory failure and DM education.   VO given.

## 2018-07-12 NOTE — Telephone Encounter (Signed)
Thank you  Can you follow up and see if pt was able to get neb machine?  I faxed everything to [facility] however I think there were some recent changes with DME and requiring physician signatures on things that in the past has not.?

## 2018-07-12 NOTE — Telephone Encounter (Signed)
Great thank you for checking on that for me I really appreciate it

## 2018-07-12 NOTE — Telephone Encounter (Signed)
Call placed to patient.   Reports that she did receive her neb machine and has begun using it.

## 2018-07-16 ENCOUNTER — Other Ambulatory Visit: Payer: Self-pay

## 2018-07-16 ENCOUNTER — Encounter: Payer: Self-pay | Admitting: Family Medicine

## 2018-07-16 ENCOUNTER — Ambulatory Visit: Payer: Medicaid Other | Admitting: Family Medicine

## 2018-07-16 VITALS — BP 126/76 | HR 104 | Temp 98.1°F | Resp 16

## 2018-07-16 DIAGNOSIS — J45901 Unspecified asthma with (acute) exacerbation: Secondary | ICD-10-CM

## 2018-07-16 DIAGNOSIS — F17200 Nicotine dependence, unspecified, uncomplicated: Secondary | ICD-10-CM

## 2018-07-16 DIAGNOSIS — J302 Other seasonal allergic rhinitis: Secondary | ICD-10-CM | POA: Diagnosis not present

## 2018-07-16 DIAGNOSIS — J441 Chronic obstructive pulmonary disease with (acute) exacerbation: Secondary | ICD-10-CM

## 2018-07-16 DIAGNOSIS — Z09 Encounter for follow-up examination after completed treatment for conditions other than malignant neoplasm: Secondary | ICD-10-CM

## 2018-07-16 MED ORDER — NICOTINE 14 MG/24HR TD PT24: 14.0000 mg | MEDICATED_PATCH | Freq: Every day | TRANSDERMAL | 0 refills | Status: DC

## 2018-07-20 ENCOUNTER — Ambulatory Visit: Payer: Medicaid Other | Admitting: Internal Medicine

## 2018-07-23 ENCOUNTER — Encounter: Payer: Self-pay | Admitting: Cardiology

## 2018-07-23 ENCOUNTER — Ambulatory Visit (INDEPENDENT_AMBULATORY_CARE_PROVIDER_SITE_OTHER): Payer: Medicaid Other | Admitting: Cardiology

## 2018-07-23 VITALS — BP 126/76 | HR 78 | Ht 66.0 in | Wt 275.0 lb

## 2018-07-23 DIAGNOSIS — I313 Pericardial effusion (noninflammatory): Secondary | ICD-10-CM

## 2018-07-23 DIAGNOSIS — I3139 Other pericardial effusion (noninflammatory): Secondary | ICD-10-CM

## 2018-07-23 NOTE — Patient Instructions (Signed)
Medication Instructions:  Stop colchicine  Labwork: none  Testing/Procedures: none  Follow-Up: Your physician wants you to follow-up in: 6 months.  You will receive a reminder letter in the mail two months in advance. If you don't receive a letter, please call our office to schedule the follow-up appointment.   Any Other Special Instructions Will Be Listed Below (If Applicable).     If you need a refill on your cardiac medications before your next appointment, please call your pharmacy.
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
## 2018-07-26 ENCOUNTER — Ambulatory Visit (HOSPITAL_COMMUNITY): Payer: Medicaid Other

## 2018-07-29 ENCOUNTER — Encounter: Payer: Medicaid Other | Admitting: Registered Nurse

## 2018-07-29 ENCOUNTER — Other Ambulatory Visit: Payer: Self-pay | Admitting: Family Medicine

## 2018-07-29 ENCOUNTER — Telehealth: Payer: Self-pay

## 2018-07-29 MED ORDER — HYDROCODONE-ACETAMINOPHEN 10-325 MG PO TABS: 1.0000 | ORAL_TABLET | Freq: Three times a day (TID) | ORAL | 0 refills | Status: DC | PRN

## 2018-07-29 NOTE — Telephone Encounter (Signed)
Pt came in but has to reschedule appt due to having an infection. Needs refill on medication to [facility].

## 2018-07-29 NOTE — Telephone Encounter (Signed)
Ms. Butner to office not feeling well, she was rescheduled. Hydrocodone was sent to pharmacy, last fill date 06/29/2018. PMP Aware Web-site was reviewed.

## 2018-08-02 ENCOUNTER — Ambulatory Visit: Payer: Medicaid Other | Admitting: Physician Assistant

## 2018-08-03 ENCOUNTER — Telehealth: Payer: Self-pay | Admitting: Family Medicine

## 2018-08-03 NOTE — Telephone Encounter (Signed)
Patient called in requesting a prescription for a mattress for her hospital bed. Patient would like the prescription to go to [facility]. Please advise?

## 2018-08-04 ENCOUNTER — Ambulatory Visit (HOSPITAL_COMMUNITY): Payer: Medicaid Other

## 2018-08-04 NOTE — Telephone Encounter (Signed)
Please have pt schedule an OV to discuss and document need for mattress/hospital bed so that requirements from insurance will be fulfilled.

## 2018-08-09 NOTE — Telephone Encounter (Signed)
Left message return call

## 2018-08-10 NOTE — Telephone Encounter (Signed)
Spoke with patient and informed her that Garrett recommended and office visit. Patient verbalized understanding and scheduled office visit.

## 2018-08-12 ENCOUNTER — Encounter: Payer: Self-pay | Admitting: Internal Medicine

## 2018-08-18 ENCOUNTER — Other Ambulatory Visit: Payer: Self-pay

## 2018-08-18 ENCOUNTER — Ambulatory Visit: Payer: Medicaid Other | Admitting: Family Medicine

## 2018-08-18 ENCOUNTER — Encounter: Payer: Self-pay | Admitting: Family Medicine

## 2018-08-18 VITALS — BP 124/82 | HR 106 | Temp 98.0°F

## 2018-08-18 DIAGNOSIS — M961 Postlaminectomy syndrome, not elsewhere classified: Secondary | ICD-10-CM

## 2018-08-18 DIAGNOSIS — K2289 Other specified disease of esophagus: Secondary | ICD-10-CM

## 2018-08-18 DIAGNOSIS — R269 Unspecified abnormalities of gait and mobility: Secondary | ICD-10-CM

## 2018-08-18 DIAGNOSIS — H1013 Acute atopic conjunctivitis, bilateral: Secondary | ICD-10-CM | POA: Diagnosis not present

## 2018-08-18 DIAGNOSIS — J329 Chronic sinusitis, unspecified: Secondary | ICD-10-CM

## 2018-08-18 DIAGNOSIS — J9610 Chronic respiratory failure, unspecified whether with hypoxia or hypercapnia: Secondary | ICD-10-CM

## 2018-08-18 DIAGNOSIS — K3189 Other diseases of stomach and duodenum: Secondary | ICD-10-CM

## 2018-08-18 DIAGNOSIS — K229 Disease of esophagus, unspecified: Secondary | ICD-10-CM

## 2018-08-18 DIAGNOSIS — H6091 Unspecified otitis externa, right ear: Secondary | ICD-10-CM

## 2018-08-18 DIAGNOSIS — M5416 Radiculopathy, lumbar region: Secondary | ICD-10-CM

## 2018-08-18 MED ORDER — MOMETASONE FUROATE 50 MCG/ACT NA SUSP: 2.0000 | Freq: Every day | NASAL | 1 refills | Status: DC

## 2018-08-18 MED ORDER — DOXYCYCLINE HYCLATE 100 MG PO TABS: 100.0000 mg | ORAL_TABLET | Freq: Two times a day (BID) | ORAL | 0 refills | Status: AC

## 2018-08-18 MED ORDER — OFLOXACIN 0.3 % OP SOLN: 10.0000 [drp] | Freq: Two times a day (BID) | OPHTHALMIC | 0 refills | Status: AC

## 2018-08-18 MED ORDER — PREDNISONE 20 MG PO TABS: ORAL_TABLET | ORAL | 0 refills | Status: DC

## 2018-08-18 MED ORDER — CROMOLYN SODIUM 4 % OP SOLN: 1.0000 [drp] | Freq: Four times a day (QID) | OPHTHALMIC | 12 refills | Status: DC

## 2018-08-18 NOTE — Progress Notes (Signed)
Patient ID: Lisa Ewing, female    DOB: 1956-02-26, 62 y.o.   MRN: [id number]  PCP: Delsa Grana, PA-C  Chief Complaint  Patient presents with  . Consult    Patient in today to discuss getting a mattress for her hospital bed. Patient was seen at Urgent Care for ear infection and pink eye. Patient would like to have ears  and eyes rechecked.    Subjective:   Lisa Ewing is a 62 y.o. female, presents to clinic with CC of blocked itchy ears, worse on the right side, eyes have been irritated feels like sand in them, she has been treated for both of these things with antibiotic drops given to her by urgent care over a month ago but she continues to have some symptoms.  She has had intermittent URI symptoms for several months, she continues still with the symptoms but they are fairly mild right now.  She denies any fever or worsening cough or shortness of breath, feels that her baseline.  She also comes in to discuss how she is due for a new mattress for her hospital bed.  She has talked to her pharmacy/medical supply store and they told her that it is been over 10 years since she had a mattress she just needs a prescription and to me she states that she needs a blue gel mattress Topper which she got last time.  She has been in a hospital bed for over a decade, she is wheelchair-bound, has chronic respiratory failure, heart failure and is nonambulatory.       Patient Active Problem List   Diagnosis Date Noted  . Acute respiratory failure with hypoxia and hypercapnia ([place]) 04/13/2018  . Acute metabolic encephalopathy 08/65/7846  . Nausea vomiting and diarrhea   . Pericardial tamponade   . Pericardial effusion 04/12/2018  . Sleep apnea 04/12/2018  . Acute lower UTI 04/12/2018  . Abdominal pain 04/12/2018  . Acute gastroenteritis 04/12/2018  . Intractable vomiting with nausea 04/12/2018  . Obesity, Class III, BMI 40-49.9 (morbid obesity) ([place]) 04/12/2018  . Dehydration 04/12/2018  .  Fatty liver 05/14/2017  . Upper abdominal pain 05/13/2017  . RUQ pain 05/13/2017  . Iron deficiency anemia 05/13/2017  . Tobacco use 11/14/2016  . Diabetes mellitus type 2, uncomplicated ([place]) 96/29/5284  . Adhesive bursitis of left shoulder 03/04/2016  . Arthritis 03/01/2015  . Mucosal abnormality of stomach   . Mucosal abnormality of esophagus   . Vitamin D deficiency 01/02/2015  . Postlaminectomy syndrome, lumbar region 12/29/2014  . Chronic lumbar radiculopathy 12/29/2014  . Dysphagia, pharyngoesophageal phase 07/18/2014  . Dyspepsia 07/13/2014  . Subacromial impingement 04/05/2014  . Trochanteric bursitis of left hip 12/15/2013  . COPD exacerbation ([place]) 10/12/2013  . Tachycardia 10/12/2013  . Hypothyroid 07/11/2013  . Allergic rhinitis   . Asthma   . COPD (chronic obstructive pulmonary disease) ([place])   . Depression   . Hyperlipidemia   . Osteoporosis   . Smoker   . Urinary tract infection 06/02/2013  . Leukocytosis, unspecified 05/28/2013  . IBS (irritable bowel syndrome) 05/11/2013  . Diarrhea 03/02/2013  . Abdominal pain, chronic, right upper quadrant 03/02/2013  . Gait disorder 01/14/2012  . Chronic leg pain 12/19/2011  . Chronic low back pain 12/19/2011  . Chronic neck pain 12/19/2011  . Shoulder pain, bilateral 12/19/2011  . Thoracic back pain 12/19/2011  . ABDOMINAL PAIN, GENERALIZED 02/06/2010  . GERD 11/27/2009  . CONSTIPATION, CHRONIC 11/27/2009  . History of colonic polyps  11/27/2009  . [place] ALLERGY 11/27/2009  . Allergy to latex 11/27/2009     Prior to Admission medications   Medication Sig Start Date End Date Taking? Authorizing Provider  ACCU-CHEK AVIVA PLUS test strip USE TO TEST ONCE DAILY. 04/30/18  Yes Dena Billet B, PA-C  ACCU-CHEK FASTCLIX LANCETS MISC Use as directed 04/29/18  Yes Dena Billet B, PA-C  acyclovir (ZOVIRAX) 400 MG tablet TAKE ONE TABLET BY MOUTH ONCE DAILY. 06/28/18  Yes Susy Frizzle, MD  ADVAIR DISKUS 250-50 MCG/DOSE  AEPB INHALE 1 PUFF TWICE DAILY; RINSE AFTER USE. Patient taking differently: Inhale 1 puff into the lungs 2 (two) times daily.  06/30/18  Yes Susy Frizzle, MD  albuterol (PROVENTIL HFA) 108 (90 Base) MCG/ACT inhaler USE 2 PUFFS EVERY 4 TO 6 HOURS AS NEEDED FOR SHORTNESS OF BREATH 07/06/18  Yes Delsa Grana, PA-C  albuterol (PROVENTIL) (2.5 MG/3ML) 0.083% nebulizer solution Take 3 mLs (2.5 mg total) by nebulization every 4 (four) hours as needed for wheezing or shortness of breath. 07/06/18  Yes Delsa Grana, PA-C  atorvastatin (LIPITOR) 40 MG tablet Take 1 tablet (40 mg total) by mouth daily at 6 PM. 07/29/18  Yes Tapia, Leisa, PA-C  calcitRIOL (ROCALTROL) 0.25 MCG capsule TAKE ONE CAPSULE BY MOUTH ONCE DAILY Patient taking differently: TAKE 0.69mg CAPSULE BY MOUTH ONCE DAILY 08/18/13  Yes PSusy Frizzle MD  cetirizine (ZYRTEC) 10 MG tablet TAKE ONE TABLET BY MOUTH ONCE DAILY. 04/27/18  Yes DDena BilletB, PA-C  EPINEPHrine 0.3 mg/0.3 mL IJ SOAJ injection INJECT 1 ML INTO THE SKIN AS NEEDED FOR ALLERGIC REACTION. 06/25/18  Yes Dixon, Mary B, PA-C  escitalopram (LEXAPRO) 20 MG tablet TAKE 1 TABLET EVERY DAY. 07/29/18  Yes TDelsa Grana PA-C  ezetimibe (ZETIA) 10 MG tablet TAKE ONE TABLET BY MOUTH ONCE DAILY. 06/30/18  Yes PSusy Frizzle MD  fenofibrate (TRICOR) 145 MG tablet TAKE 1 TABLET BY MOUTH ONCE A DAY. 06/30/18  Yes PSusy Frizzle MD  furosemide (LASIX) 20 MG tablet TAKE ONE TABLET BY MOUTH ONCE DAILY. 07/08/18  Yes TDelsa Grana PA-C  gabapentin (NEURONTIN) 300 MG capsule Take two capsule in the morning, one capsule in the afternoon  and 2 capsules at bedtime 04/30/18  Yes Kirsteins, ALuanna Salk MD  HYDROcodone-acetaminophen (NORCO) 10-325 MG tablet Take 1 tablet by mouth 3 (three) times daily as needed for moderate pain. 07/29/18  Yes TBayard Hugger NP  hyoscyamine (LEVSIN SL) 0.125 MG SL tablet PLACE 1 TABLET UNDER THE TONGUE EVERY 4 HOURS AS NEEDED Patient taking  differently: Take 0.125 mg by mouth every 4 (four) hours as needed for cramping.  02/19/16  Yes GCarlis Stable NP  insulin aspart (NOVOLOG) 100 UNIT/ML FlexPen Before each meal 3 times a day, 140-199 - 2 units, 200-250 - 4 units, 251-299 - 6 units,  300-349 - 8 units,  350 or above 10 units. Insulin PEN if approved, provide syringes and needles if needed. 04/19/18  Yes XFlorencia Reasons MD  ipratropium-albuterol (DUONEB) 0.5-2.5 (3) MG/3ML SOLN INHALE THE CONTENTS OF ONE VIAL VIA NEBULIZER BY MOUTH FOUR TIMES A DAY AS NEEDED FOR WHEEZING OR SHORTNESS OF BREATH 01/08/17  Yes Dixon, Mary B, PA-C  JANUVIA 100 MG tablet TAKE ONE TABLET BY MOUTH ONCE DAILY. 07/29/18  Yes TDelsa Grana PA-C  levothyroxine (SYNTHROID, LEVOTHROID) 137 MCG tablet TAKE 1 TABLET BEFORE BREAKFAST. Patient taking differently: Take 137 mcg by mouth daily before breakfast.  06/25/18  Yes DOrlena Sheldon PA-C  losartan (COZAAR) 50 MG tablet TAKE 1 TABLET BY MOUTH ONCE A DAY. 06/30/18  Yes Susy Frizzle, MD  metFORMIN (GLUCOPHAGE) 1000 MG tablet TAKE 1 TABLET BY MOUTH TWICE DAILY WITH MEALS. 06/30/18  Yes Susy Frizzle, MD  methocarbamol (ROBAXIN) 500 MG tablet TAKE (1) TABLET BY MOUTH TWICE A DAY AS NEEDED. 05/28/18  Yes Danella Sensing L, NP  montelukast (SINGULAIR) 10 MG tablet TAKE (1) TABLET BY MOUTH AT BEDTIME. Patient taking differently: Take 10 mg by mouth at bedtime.  06/30/18  Yes Pickard, Cammie Mcgee, MD  NIASPAN 1000 MG CR tablet TAKE (2) TABLETS BY MOUTH AT BEDTIME. Patient taking differently: Take 2,000 mg by mouth at bedtime.  06/30/18  Yes Susy Frizzle, MD  nicotine (NICODERM CQ - DOSED IN MG/24 HOURS) 14 mg/24hr patch Place 1 patch (14 mg total) onto the skin daily. 07/16/18  Yes Delsa Grana, PA-C  nystatin (MYCOSTATIN) 100000 UNIT/ML suspension Take 6m 4 times daily after meals and at bedtime swish and swallow. 04/26/18  Yes DDena BilletB, PA-C  nystatin cream (MYCOSTATIN) APPLY 1 APPLICATION TO AFFECTED AREA(S) TWICE  DAILY as needed for irritations 01/08/17  Yes Dixon, Mary B, PA-C  pantoprazole (PROTONIX) 40 MG tablet TAKE ONE TABLET BY MOUTH 2 TIMES A DAY BEFORE A MEAL. 01/19/18  Yes LMahala Menghini PA-C  predniSONE (DELTASONE) 10 MG tablet Take 1 tablet (10 mg total) by mouth daily. 07/08/18  Yes Amin, AJeanella Flattery MD  raloxifene (EVISTA) 60 MG tablet TAKE ONE TABLET BY MOUTH ONCE DAILY. 06/30/18  Yes PSusy Frizzle MD  Respiratory Therapy Supplies (NEBULIZER/TUBING/MOUTHPIECE) KIT Disp one nebulizer machine, tubing set and mouthpiece kit 07/09/18  Yes TDelsa Grana PA-C  sucralfate (CARAFATE) 1 g tablet TAKE 1g TABLET BY MOUTH 4 TIMES A DAY WITH MEALS AND AT BEDTIME as needed for stomach coating 01/08/17  Yes Dixon, Mary B, PA-C  traZODone (DESYREL) 100 MG tablet Take 1 tablet (100 mg total) by mouth at bedtime. 08/09/12  Yes Kirchmayer, Deanna, MD  triamcinolone (KENALOG) 0.025 % cream APPLY 1 APPLICATION TOPICALLY TO AFFECTED AREA(S) TWICE DAILY as needed 01/08/17  Yes Dixon, Mary B, PA-C  trimethoprim-polymyxin b (POLYTRIM) ophthalmic solution Place 2 drops into both eyes every 6 (six) hours.   Yes [provider]  VESICARE 5 MG tablet TAKE ONE TABLET BY MOUTH ONCE DAILY. 07/29/18  Yes TDelsa Grana PA-C  VOLTAREN 1 % GEL APPLY 2 GRAMS TO AFFECTED AREA FOUR TIMES A DAY. 01/19/18  Yes TBayard Hugger NP  solifenacin (VESICARE) 5 MG tablet Take 10 mg by mouth daily.  03/03/13  [provider]     Allergies  Allergen Reactions  . Bee Venom Anaphylaxis  . Latex Shortness Of Breath  . Oxycodone-Acetaminophen Hives and Shortness Of Breath    Upset Stomach  . Penicillins Anaphylaxis    Throat swells Has patient had a PCN reaction causing immediate rash, facial/tongue/throat swelling, SOB or lightheadedness with hypotension: yes Has patient had a PCN reaction causing severe rash involving mucus membranes or skin necrosis: no Has patient had a PCN reaction that required hospitalization- yes  at hospital Has patient had a PCN reaction occurring within the last 10 years: no If all of the above answers are "NO", then may proceed with Cephalosporin use.   . Rocephin [Ceftriaxone Sodium In Dextrose] Swelling    Tingling around lips  . Shellfish Allergy Anaphylaxis    Throat swells   . Codeine Nausea Only  . Metoclopramide Hcl  Doesn't recall type of reaction  . Pregabalin Swelling  . Sulfonamide Derivatives     Tongue swells   . Adhesive [Tape] Rash  . Other Swelling, Itching and Rash    Almonds. Bananas cause an asthma attack.   . Wellbutrin [Bupropion] Rash    Hives, red whelps     Family History  Problem Relation Age of Onset  . Hyperlipidemia Mother   . Diabetes Father   . Breast cancer Maternal Aunt   . Throat cancer Maternal Grandmother   . Colon cancer Neg Hx      Social History   Socioeconomic History  . Marital status: Divorced    Spouse name: Not on file  . Number of children: Not on file  . Years of education: Not on file  . Highest education level: Not on file  Occupational History  . Not on file  Social Needs  . Financial resource strain: Not on file  . Food insecurity:    Worry: Not on file    Inability: Not on file  . Transportation needs:    Medical: Not on file    Non-medical: Not on file  Tobacco Use  . Smoking status: Current Every Day Smoker    Packs/day: 1.00    Years: 42.00    Pack years: 42.00    Types: Cigarettes  . Smokeless tobacco: Never Used  Substance and Sexual Activity  . Alcohol use: Not Currently    Alcohol/week: 0.0 standard drinks  . Drug use: No  . Sexual activity: Never    Birth control/protection: Surgical  Lifestyle  . Physical activity:    Days per week: Not on file    Minutes per session: Not on file  . Stress: Not on file  Relationships  . Social connections:    Talks on phone: Not on file    Gets together: Not on file    Attends religious service: Not on file    Active member of club or  organization: Not on file    Attends meetings of clubs or organizations: Not on file    Relationship status: Not on file  . Intimate partner violence:    Fear of current or ex partner: Not on file    Emotionally abused: Not on file    Physically abused: Not on file    Forced sexual activity: Not on file  Other Topics Concern  . Not on file  Social History Narrative  . Not on file     Review of Systems  Constitutional: Negative.   HENT: Negative.   Eyes: Negative.   Respiratory: Negative.   Cardiovascular: Negative.   Gastrointestinal: Negative.   Endocrine: Negative.   Genitourinary: Negative.   Musculoskeletal: Negative.   Skin: Negative.   Allergic/Immunologic: Negative.   Neurological: Negative.   Hematological: Negative.   Psychiatric/Behavioral: Negative.   All other systems reviewed and are negative.      Objective:    Vitals:   08/18/18 1133  BP: 124/82  Pulse: (!) 106  Temp: 98 F (36.7 C)  TempSrc: Oral  SpO2: 93%      Physical Exam Vitals signs and nursing note reviewed.  Constitutional:      General: She is not in acute distress.    Appearance: She is well-developed. She is obese. She is not toxic-appearing or diaphoretic.     Comments: Chronically ill-appearing, wheelchair, no respiratory distress  HENT:     Head: Normocephalic and atraumatic.     Right Ear: Hearing,  ear canal and external ear normal. There is no impacted cerumen.     Left Ear: Hearing, tympanic membrane, ear canal and external ear normal. There is no impacted cerumen.     Ears:     Comments: TM dull and opaque with scant fleck of what appears to be dried blood on it, land marks obscured, no erythema, canal mildly edematous and erythematous, no discharge    Nose: Mucosal edema, congestion and rhinorrhea present.     Right Sinus: No maxillary sinus tenderness or frontal sinus tenderness.     Left Sinus: No maxillary sinus tenderness or frontal sinus tenderness.      Mouth/Throat:     Mouth: Mucous membranes are moist. Mucous membranes are not pale.     Pharynx: Uvula midline. No oropharyngeal exudate, posterior oropharyngeal erythema or uvula swelling.     Tonsils: No tonsillar abscesses.  Eyes:     General: No allergic shiner or scleral icterus.       Right eye: No discharge.        Left eye: No discharge.     Extraocular Movements: Extraocular movements intact.     Conjunctiva/sclera: Conjunctivae normal.     Right eye: Right conjunctiva is not injected. No chemosis or exudate.    Left eye: Left conjunctiva is not injected. No chemosis or exudate.    Pupils: Pupils are equal, round, and reactive to light.     Comments: Bilateral upper and lower eyelids appear mildly edematous and erythematous -near her baseline, no interval injection scant dried drainage on her eyelashes No periorbital edema or erythema  Neck:     Musculoskeletal: Normal range of motion and neck supple.     Trachea: No tracheal deviation.  Cardiovascular:     Rate and Rhythm: Normal rate and regular rhythm.     Heart sounds: Normal heart sounds.  Pulmonary:     Effort: Pulmonary effort is normal. No respiratory distress.     Breath sounds: No stridor. Rhonchi present. No wheezing or rales.  Chest:     Chest wall: No tenderness.  Abdominal:     General: Bowel sounds are normal. There is no distension.     Palpations: Abdomen is soft.     Tenderness: There is no abdominal tenderness.  Musculoskeletal: Normal range of motion.  Skin:    General: Skin is warm and dry.     Coloration: Skin is not pale.     Findings: No rash.  Neurological:     Mental Status: She is alert.     Motor: No abnormal muscle tone.     Comments: In wheelchair, moves upper extremities with normal coordination  Psychiatric:        Mood and Affect: Mood normal.        Behavior: Behavior normal.           Assessment & Plan:      ICD-10-CM   1. Allergic conjunctivitis of both eyes H10.13  predniSONE (DELTASONE) 20 MG tablet    cromolyn (OPTICROM) 4 % ophthalmic solution   tx with abx drops, no improvement, grainy and itchy - suspect allergic conjunctivitis  2. Otitis externa of right ear, unspecified chronicity, unspecified type H60.91 ofloxacin (OCUFLOX) 0.3 % ophthalmic solution   dull tm, with blood, believe tm is intact, tx with ofloxacin to cover - safe for middle ear     Pt has need for new mattress to go with her hospital bed.  Mattress > 57 years old, requests with  blue gel mattress topper - which she was prescribed before.   Rx sent to [facility].  Pt requires hospital bed due to problems with  aspiration secondary to esophageal and gastric mucosal abnormalities, she is nonambulatory, wheelchair-bound, she also has chronic pulmonary disease with  chronic respiratory failure and severe history of allergies and persistent severe  asthma, additionally has had acute CHF secondary to pericardial effusion, as well  as the diagnoses listed below.      3. Obesity, Class III, BMI 40-49.9 (morbid obesity) ([facility]) E66.01   4. Gait disorder R26.9   5. Mucosal abnormality of esophagus K22.9   6. Mucosal abnormality of stomach K31.89   7. Chronic lumbar radiculopathy M54.16   8. Postlaminectomy syndrome, lumbar region M96.1   9. Chronic respiratory failure, unspecified whether with hypoxia or hypercapnia ([place]) J96.10        Delsa Grana, PA-C 08/18/18 12:04 PM

## 2018-08-23 ENCOUNTER — Other Ambulatory Visit: Payer: Self-pay | Admitting: Gastroenterology

## 2018-08-23 ENCOUNTER — Other Ambulatory Visit: Payer: Self-pay | Admitting: Family Medicine

## 2018-08-23 ENCOUNTER — Other Ambulatory Visit: Payer: Self-pay | Admitting: Physical Medicine & Rehabilitation

## 2018-08-23 ENCOUNTER — Other Ambulatory Visit: Payer: Self-pay | Admitting: Internal Medicine

## 2018-08-23 NOTE — Telephone Encounter (Signed)
Prescription has already been called in

## 2018-08-24 ENCOUNTER — Other Ambulatory Visit: Payer: Self-pay | Admitting: Family Medicine

## 2018-08-24 ENCOUNTER — Other Ambulatory Visit: Payer: Self-pay | Admitting: Gastroenterology

## 2018-08-25 ENCOUNTER — Other Ambulatory Visit: Payer: Self-pay | Admitting: *Deleted

## 2018-08-25 ENCOUNTER — Other Ambulatory Visit: Payer: Self-pay

## 2018-08-25 MED ORDER — NIACIN ER (ANTIHYPERLIPIDEMIC) 1000 MG PO TBCR: EXTENDED_RELEASE_TABLET | ORAL | 0 refills | Status: DC

## 2018-08-25 MED ORDER — LEVOTHYROXINE SODIUM 137 MCG PO TABS: 137.0000 ug | ORAL_TABLET | Freq: Every day | ORAL | 0 refills | Status: DC

## 2018-08-26 ENCOUNTER — Other Ambulatory Visit: Payer: Self-pay | Admitting: Family Medicine

## 2018-08-26 ENCOUNTER — Encounter: Payer: Medicaid Other | Attending: Physical Medicine and Rehabilitation | Admitting: Registered Nurse

## 2018-08-26 ENCOUNTER — Other Ambulatory Visit: Payer: Self-pay | Admitting: Physical Medicine & Rehabilitation

## 2018-08-26 ENCOUNTER — Encounter: Payer: Self-pay | Admitting: Registered Nurse

## 2018-08-26 VITALS — BP 119/78 | HR 107 | Resp 14

## 2018-08-26 DIAGNOSIS — M25551 Pain in right hip: Secondary | ICD-10-CM | POA: Insufficient documentation

## 2018-08-26 DIAGNOSIS — M75101 Unspecified rotator cuff tear or rupture of right shoulder, not specified as traumatic: Secondary | ICD-10-CM

## 2018-08-26 DIAGNOSIS — E039 Hypothyroidism, unspecified: Secondary | ICD-10-CM | POA: Diagnosis not present

## 2018-08-26 DIAGNOSIS — F17210 Nicotine dependence, cigarettes, uncomplicated: Secondary | ICD-10-CM | POA: Insufficient documentation

## 2018-08-26 DIAGNOSIS — M542 Cervicalgia: Secondary | ICD-10-CM

## 2018-08-26 DIAGNOSIS — M79604 Pain in right leg: Secondary | ICD-10-CM | POA: Insufficient documentation

## 2018-08-26 DIAGNOSIS — Z79891 Long term (current) use of opiate analgesic: Secondary | ICD-10-CM

## 2018-08-26 DIAGNOSIS — M5412 Radiculopathy, cervical region: Secondary | ICD-10-CM

## 2018-08-26 DIAGNOSIS — J449 Chronic obstructive pulmonary disease, unspecified: Secondary | ICD-10-CM | POA: Insufficient documentation

## 2018-08-26 DIAGNOSIS — M810 Age-related osteoporosis without current pathological fracture: Secondary | ICD-10-CM | POA: Diagnosis not present

## 2018-08-26 DIAGNOSIS — G8929 Other chronic pain: Secondary | ICD-10-CM | POA: Diagnosis not present

## 2018-08-26 DIAGNOSIS — E119 Type 2 diabetes mellitus without complications: Secondary | ICD-10-CM | POA: Diagnosis not present

## 2018-08-26 DIAGNOSIS — M25512 Pain in left shoulder: Secondary | ICD-10-CM | POA: Insufficient documentation

## 2018-08-26 DIAGNOSIS — M47814 Spondylosis without myelopathy or radiculopathy, thoracic region: Secondary | ICD-10-CM | POA: Diagnosis not present

## 2018-08-26 DIAGNOSIS — M546 Pain in thoracic spine: Secondary | ICD-10-CM

## 2018-08-26 DIAGNOSIS — E785 Hyperlipidemia, unspecified: Secondary | ICD-10-CM | POA: Insufficient documentation

## 2018-08-26 DIAGNOSIS — M5416 Radiculopathy, lumbar region: Secondary | ICD-10-CM

## 2018-08-26 DIAGNOSIS — M7918 Myalgia, other site: Secondary | ICD-10-CM

## 2018-08-26 DIAGNOSIS — M7542 Impingement syndrome of left shoulder: Secondary | ICD-10-CM | POA: Diagnosis not present

## 2018-08-26 DIAGNOSIS — K219 Gastro-esophageal reflux disease without esophagitis: Secondary | ICD-10-CM | POA: Insufficient documentation

## 2018-08-26 DIAGNOSIS — M47816 Spondylosis without myelopathy or radiculopathy, lumbar region: Secondary | ICD-10-CM | POA: Diagnosis not present

## 2018-08-26 DIAGNOSIS — IMO0002 Reserved for concepts with insufficient information to code with codable children: Secondary | ICD-10-CM

## 2018-08-26 DIAGNOSIS — M961 Postlaminectomy syndrome, not elsewhere classified: Secondary | ICD-10-CM

## 2018-08-26 DIAGNOSIS — M7061 Trochanteric bursitis, right hip: Secondary | ICD-10-CM

## 2018-08-26 DIAGNOSIS — Z5181 Encounter for therapeutic drug level monitoring: Secondary | ICD-10-CM

## 2018-08-26 DIAGNOSIS — G894 Chronic pain syndrome: Secondary | ICD-10-CM

## 2018-08-26 DIAGNOSIS — M7062 Trochanteric bursitis, left hip: Secondary | ICD-10-CM

## 2018-08-26 MED ORDER — HYDROCODONE-ACETAMINOPHEN 10-325 MG PO TABS: 1.0000 | ORAL_TABLET | Freq: Three times a day (TID) | ORAL | 0 refills | Status: DC | PRN
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
## 2018-09-14 ENCOUNTER — Ambulatory Visit: Payer: Medicaid Other | Admitting: Internal Medicine

## 2018-09-14 ENCOUNTER — Ambulatory Visit: Payer: Medicaid Other | Admitting: Urology

## 2018-09-16 ENCOUNTER — Other Ambulatory Visit: Payer: Self-pay | Admitting: Registered Nurse

## 2018-09-16 DIAGNOSIS — M7062 Trochanteric bursitis, left hip: Secondary | ICD-10-CM

## 2018-09-16 DIAGNOSIS — M25561 Pain in right knee: Principal | ICD-10-CM

## 2018-09-16 DIAGNOSIS — M25512 Pain in left shoulder: Secondary | ICD-10-CM

## 2018-09-16 DIAGNOSIS — G8929 Other chronic pain: Secondary | ICD-10-CM

## 2018-09-17 ENCOUNTER — Ambulatory Visit: Payer: Medicaid Other | Admitting: Internal Medicine

## 2018-09-27 ENCOUNTER — Ambulatory Visit: Payer: Medicaid Other | Admitting: Physical Medicine & Rehabilitation

## 2018-09-28 ENCOUNTER — Other Ambulatory Visit: Payer: Self-pay | Admitting: Physical Medicine & Rehabilitation

## 2018-09-28 ENCOUNTER — Other Ambulatory Visit: Payer: Self-pay | Admitting: Family Medicine

## 2018-09-29 NOTE — Telephone Encounter (Signed)
Pt called stating she had to reschedule her appt for a procedure due to transportation issues and she will be out of her medication before visit. Hydrocdone/APAP last fill 08/26/18 #90. Next appt 10/04/2018. [employer]

## 2018-10-04 ENCOUNTER — Telehealth: Payer: Self-pay | Admitting: Family Medicine

## 2018-10-04 ENCOUNTER — Ambulatory Visit: Payer: Medicaid Other | Admitting: Physical Medicine & Rehabilitation

## 2018-10-04 ENCOUNTER — Encounter: Payer: Medicaid Other | Attending: Physical Medicine and Rehabilitation

## 2018-10-04 VITALS — BP 80/44 | HR 113

## 2018-10-04 DIAGNOSIS — M47814 Spondylosis without myelopathy or radiculopathy, thoracic region: Secondary | ICD-10-CM | POA: Diagnosis not present

## 2018-10-04 DIAGNOSIS — G8929 Other chronic pain: Secondary | ICD-10-CM | POA: Insufficient documentation

## 2018-10-04 DIAGNOSIS — K219 Gastro-esophageal reflux disease without esophagitis: Secondary | ICD-10-CM | POA: Insufficient documentation

## 2018-10-04 DIAGNOSIS — M542 Cervicalgia: Secondary | ICD-10-CM

## 2018-10-04 DIAGNOSIS — E119 Type 2 diabetes mellitus without complications: Secondary | ICD-10-CM | POA: Diagnosis not present

## 2018-10-04 DIAGNOSIS — M25512 Pain in left shoulder: Secondary | ICD-10-CM | POA: Insufficient documentation

## 2018-10-04 DIAGNOSIS — J449 Chronic obstructive pulmonary disease, unspecified: Secondary | ICD-10-CM | POA: Diagnosis not present

## 2018-10-04 DIAGNOSIS — E039 Hypothyroidism, unspecified: Secondary | ICD-10-CM | POA: Diagnosis not present

## 2018-10-04 DIAGNOSIS — F17210 Nicotine dependence, cigarettes, uncomplicated: Secondary | ICD-10-CM | POA: Insufficient documentation

## 2018-10-04 DIAGNOSIS — E785 Hyperlipidemia, unspecified: Secondary | ICD-10-CM | POA: Diagnosis not present

## 2018-10-04 DIAGNOSIS — M47816 Spondylosis without myelopathy or radiculopathy, lumbar region: Secondary | ICD-10-CM | POA: Diagnosis not present

## 2018-10-04 DIAGNOSIS — M810 Age-related osteoporosis without current pathological fracture: Secondary | ICD-10-CM | POA: Diagnosis not present

## 2018-10-04 DIAGNOSIS — M7918 Myalgia, other site: Secondary | ICD-10-CM

## 2018-10-04 DIAGNOSIS — M25551 Pain in right hip: Secondary | ICD-10-CM | POA: Diagnosis not present

## 2018-10-04 DIAGNOSIS — M79604 Pain in right leg: Secondary | ICD-10-CM | POA: Diagnosis not present

## 2018-10-04 NOTE — Telephone Encounter (Signed)
Patient called in to front desk while we were on lunch and left a voicemail stating that she was at her pain doctors office and her blood pressure was 80/44, pulse is 115 and oxygen saturation was 87%. I have called and left voicemail for patient to return my call as soon as possible. Awaiting return call from patient.

## 2018-10-04 NOTE — Patient Instructions (Signed)
Please see your primary MD regarding low blood pressure and elevated pulse

## 2018-10-04 NOTE — Progress Notes (Signed)
Trigger Point Injection  Indication: Bilateral Trapezius and T 7 paraspinal trigger point injections Myofascial pain not relieved by medication management and other conservative care.  Informed consent was obtained after describing risk and benefits of the procedure with the patient, this includes bleeding, bruising, infection and medication side effects.  The patient wishes to proceed and has given written consent.  The patient was placed in a Seated position.  The upper trap and T7 paraspinal  area was marked and prepped with Betadine.  It was entered with a 25-gauge 1-1/2 inch needle and 1 mL of 1% lidocaine was injected into each of 4 trigger points, after negative draw back for blood.  The patient tolerated the procedure well.  Post procedure instructions were given.  We also discussed vitals Low systolic BP but denies dizziness, mentation appears at baseline.  States she hasn't had a good appetite but denies diarrhea  Lungs clear , no rales , wheezes or rhonchi Cor tachy rate 115  O2 sat 89%, no respiratory distress  Advised pt to see PCP today or go to urgent care,  Can be transported by her aide  Pt states she has not received delivery of hydrocodone from pharmacy, order has been sent CMA to call pharmacy for pt  F/u with Danella Sensing NP next month

## 2018-10-04 NOTE — Telephone Encounter (Signed)
Patient returned call and I advised patient to go to the ER for further work up. Per Pain doctors notes patient was advised to be seen today. I informed patient that we currently do not have any appointments and patient asked if she can be seen later this week. Advised patient that she would need to be seen today and we could follow up with her later on this week. She stated that she has no means of transportation to get to UC or ER today. Asked patient if she could call someone or even call EMS. Patient stated that she will see first if her aide can take her and if not see will see about call 911. I stressed to patient that I do not advise she wait. Patient verbalized understanding.

## 2018-10-06 ENCOUNTER — Telehealth: Payer: Self-pay | Admitting: Family Medicine

## 2018-10-06 NOTE — Telephone Encounter (Signed)
Called patient to follow up on her from phone call on 10/04/2018. Patient states that she has been feeling fine. She has checked her BP at home and it was 124/97 and Oxygen was 93 on room air, pulse was 98. She stated that she did not feel ill on Monday even with off vitals so she did not go to ER or Urgent care. Patient scheduled her 3 month follow up appointment for next week.

## 2018-10-08 ENCOUNTER — Ambulatory Visit: Payer: Medicaid Other | Admitting: Internal Medicine

## 2018-10-12 ENCOUNTER — Ambulatory Visit: Payer: Self-pay | Admitting: Family Medicine

## 2018-10-13 ENCOUNTER — Ambulatory Visit: Payer: Medicaid Other | Admitting: Family Medicine

## 2018-10-13 ENCOUNTER — Encounter: Payer: Self-pay | Admitting: Family Medicine

## 2018-10-13 VITALS — BP 132/78 | HR 95 | Temp 97.9°F | Resp 16 | Ht 65.5 in

## 2018-10-13 DIAGNOSIS — E119 Type 2 diabetes mellitus without complications: Secondary | ICD-10-CM | POA: Diagnosis not present

## 2018-10-13 DIAGNOSIS — E875 Hyperkalemia: Secondary | ICD-10-CM

## 2018-10-13 DIAGNOSIS — Z794 Long term (current) use of insulin: Secondary | ICD-10-CM

## 2018-10-13 DIAGNOSIS — E1165 Type 2 diabetes mellitus with hyperglycemia: Secondary | ICD-10-CM

## 2018-10-13 DIAGNOSIS — E032 Hypothyroidism due to medicaments and other exogenous substances: Secondary | ICD-10-CM

## 2018-10-13 DIAGNOSIS — D509 Iron deficiency anemia, unspecified: Secondary | ICD-10-CM

## 2018-10-13 DIAGNOSIS — F3289 Other specified depressive episodes: Secondary | ICD-10-CM | POA: Diagnosis not present

## 2018-10-13 DIAGNOSIS — J449 Chronic obstructive pulmonary disease, unspecified: Secondary | ICD-10-CM

## 2018-10-13 DIAGNOSIS — J9610 Chronic respiratory failure, unspecified whether with hypoxia or hypercapnia: Secondary | ICD-10-CM

## 2018-10-13 DIAGNOSIS — IMO0002 Reserved for concepts with insufficient information to code with codable children: Secondary | ICD-10-CM

## 2018-10-13 DIAGNOSIS — I700 Atherosclerosis of aorta: Secondary | ICD-10-CM

## 2018-10-13 DIAGNOSIS — Z720 Tobacco use: Secondary | ICD-10-CM

## 2018-10-13 DIAGNOSIS — J4540 Moderate persistent asthma, uncomplicated: Secondary | ICD-10-CM

## 2018-10-13 DIAGNOSIS — E038 Other specified hypothyroidism: Secondary | ICD-10-CM

## 2018-10-13 DIAGNOSIS — E66813 Obesity, class 3: Secondary | ICD-10-CM

## 2018-10-13 DIAGNOSIS — E785 Hyperlipidemia, unspecified: Secondary | ICD-10-CM

## 2018-10-13 NOTE — Progress Notes (Signed)
Patient ID: Lisa Ewing, female    DOB: 01-Oct-1955, 63 y.o.   MRN: [id number]  PCP: Delsa Grana, PA-C  Chief Complaint  Patient presents with  . Diabetes    Patient states she is fasting, has concerns of hair loss    Subjective:   Lisa Ewing is a 63 y.o. female, presents to clinic with CC of routine f/up for HTN, HLD, DM, and COPD/asthma.  She has many other chronic health issues and today she has several acute complaints as well - she is newly on my panel and I have previously seen her for acute illnesses, but not for routine care yet - her PCP/PA recently left the practice.  DM-  She is compliant with meds, although she says she only takes metformin BID.  With reviewing med list further she does not know if she is on [place] and only take insulin PRN - says she give 4-6 units if blood sugar over 240 in am.  She is not doing sliding scale like its prescribed.  She says her sugars are usually 100-200, no hypoglycemic episodes.   She is due for labs today.  HTN-  She again says she's taking meds that the pharmacy gives her, says shes compliant and takes daily.   Losartan 50 mg, and lasix 20 mg daily  - one low reading at pain management 1/5- one low reading at OV, couldn't get in appt, checked at home was 130/80, she never had low BP again.   HLD - says shes compliant with meds - lipitor 40 mg, zetia and tricor.  No myalgias, no jaundice.  Due for fasting labs.   COPD/asthma respiratory failure?- Pt states she is "better" not using O2 - no prednisone since early December - going to pulmonology for new pt consult soon.  She continues to smoke.     [place] bubble packs for her medications - takes all am meds together, and evening meds together "I don't know what I take any more"  Concern for left arm being larger than her right arm, she only lays on her left side usually laying on her left arm, is non-ambulatory.  Skin is soft, just looks larger her and her aide say its maybe  gotten larger in the past couple months, no firmness, erythema, rash, injury.  No swelling any where else.  She does not know how much weight she's gained over years, but as far as she knows she has not had any acute change to weight, no change to orthopnea, no LE edema.  No CP She reports Hair falling out in globs.  Hx of hypothyroid secondary to thyroidectomy - she has levothyroixine 163mg - she is with other morning meds - including taking thyroid meds with protonix and with food.    Patient Active Problem List   Diagnosis Date Noted  . Uncontrolled type 2 diabetes mellitus with insulin therapy ([facility] 10/14/2018  . Aortic atherosclerosis ([facility] 10/14/2018  . Acute respiratory failure with hypoxia and hypercapnia ([facility] 04/13/2018  . Sleep apnea 04/12/2018  . Obesity, Class III, BMI 40-49.9 (morbid obesity) ([facility] 04/12/2018  . Fatty liver 05/14/2017  . Iron deficiency anemia 05/13/2017  . Tobacco use 11/14/2016  . Adhesive bursitis of left shoulder 03/04/2016  . Arthritis 03/01/2015  . Mucosal abnormality of stomach   . Mucosal abnormality of esophagus   . Vitamin D deficiency 01/02/2015  . Postlaminectomy syndrome, lumbar region 12/29/2014  . Chronic lumbar radiculopathy 12/29/2014  . Dysphagia, pharyngoesophageal phase  07/18/2014  . Subacromial impingement 04/05/2014  . Trochanteric bursitis of left hip 12/15/2013  . Tachycardia 10/12/2013  . Hypothyroidism, iatrogenic 07/11/2013  . Allergic rhinitis   . Asthma   . COPD (chronic obstructive pulmonary disease) ([place])   . Depression   . Hyperlipidemia   . Osteoporosis   . Smoker   . Leukocytosis, unspecified 05/28/2013  . IBS (irritable bowel syndrome) 05/11/2013  . Gait disorder 01/14/2012  . Chronic leg pain 12/19/2011  . Chronic low back pain 12/19/2011  . Chronic neck pain 12/19/2011  . Shoulder pain, bilateral 12/19/2011  . Thoracic back pain 12/19/2011  . GERD 11/27/2009  . CONSTIPATION, CHRONIC 11/27/2009  . History of  colonic polyps 11/27/2009  . [place] ALLERGY 11/27/2009  . Allergy to latex 11/27/2009    Current Meds  Medication Sig  . ACCU-CHEK AVIVA PLUS test strip USE TO TEST ONCE DAILY.  Marland Kitchen ACCU-CHEK FASTCLIX LANCETS MISC Use as directed  . acyclovir (ZOVIRAX) 400 MG tablet TAKE ONE TABLET BY MOUTH ONCE DAILY.  Marland Kitchen ADVAIR DISKUS 250-50 MCG/DOSE AEPB INHALE 1 PUFF TWICE DAILY; RINSE AFTER USE.  Marland Kitchen albuterol (PROVENTIL HFA) 108 (90 Base) MCG/ACT inhaler USE 2 PUFFS EVERY 4 TO 6 HOURS AS NEEDED FOR SHORTNESS OF BREATH  . albuterol (PROVENTIL) (2.5 MG/3ML) 0.083% nebulizer solution INHALE 1 VIAL VIA NEBULIZER EVERY 4 HOURS AS NEEDED FOR WHEEZING/SHORTNESS OF BREATH.  Marland Kitchen atorvastatin (LIPITOR) 40 MG tablet Take 1 tablet (40 mg total) by mouth daily at 6 PM.  . calcitRIOL (ROCALTROL) 0.25 MCG capsule TAKE ONE CAPSULE BY MOUTH ONCE DAILY (Patient taking differently: TAKE 0.103mg CAPSULE BY MOUTH ONCE DAILY)  . cetirizine (ZYRTEC) 10 MG tablet TAKE ONE TABLET BY MOUTH ONCE DAILY.  .Marland Kitchenescitalopram (LEXAPRO) 20 MG tablet TAKE 1 TABLET EVERY DAY.  .Marland Kitchenezetimibe (ZETIA) 10 MG tablet TAKE ONE TABLET BY MOUTH ONCE DAILY.  . fenofibrate (TRICOR) 145 MG tablet TAKE 1 TABLET BY MOUTH ONCE A DAY.  . furosemide (LASIX) 20 MG tablet TAKE ONE TABLET BY MOUTH ONCE DAILY.  .Marland Kitchengabapentin (NEURONTIN) 300 MG capsule TAKE 2 CAPSULES IN THE MORNING; 1 CAPSULE IN THE AFTERNOON AND 2 CAPSULES AT BEDTIME.  .Marland KitchenHYDROcodone-acetaminophen (NORCO) 10-325 MG tablet TAKE 1 TABLET BY MOUTH 3 TIMES DAILY AS NEEDED FOR MODERATE PAIN.  . hyoscyamine (LEVSIN SL) 0.125 MG SL tablet DISSOLVE ONE TABLET UNDER THE TONGUE BEFORE MEALS AND AT BEDTIME AS NEEDED FOR ABDOMINAL CRAMPS OR DIARRHEA.  .Marland Kitcheninsulin aspart (NOVOLOG) 100 UNIT/ML FlexPen Before each meal 3 times a day, 140-199 - 2 units, 200-250 - 4 units, 251-299 - 6 units,  300-349 - 8 units,  350 or above 10 units. Insulin PEN if approved, provide syringes and needles if needed.  .Marland KitchenJANUVIA 100 MG  tablet TAKE ONE TABLET BY MOUTH ONCE DAILY.  .Marland Kitchenlevothyroxine (SYNTHROID, LEVOTHROID) 137 MCG tablet Take 1 tablet (137 mcg total) by mouth daily before breakfast.  . losartan (COZAAR) 50 MG tablet TAKE 1 TABLET BY MOUTH ONCE A DAY.  . metFORMIN (GLUCOPHAGE) 1000 MG tablet TAKE 1 TABLET BY MOUTH TWICE DAILY WITH MEALS.  . methocarbamol (ROBAXIN) 500 MG tablet TAKE (1) TABLET BY MOUTH TWICE A DAY AS NEEDED.  .Marland Kitchenmometasone (NASONEX) 50 MCG/ACT nasal spray Place 2 sprays into the nose daily.  . montelukast (SINGULAIR) 10 MG tablet TAKE (1) TABLET BY MOUTH AT BEDTIME.  . niacin (NIASPAN) 1000 MG CR tablet TAKE 2 TABLETS BY MOUTH AT BEDTIME  . nicotine (NICODERM CQ - DOSED  IN MG/24 HOURS) 14 mg/24hr patch Place 1 patch (14 mg total) onto the skin daily.  Marland Kitchen nystatin (MYCOSTATIN) 100000 UNIT/ML suspension Take 11m 4 times daily after meals and at bedtime swish and swallow.  . nystatin cream (MYCOSTATIN) APPLY 1 APPLICATION TO AFFECTED AREA(S) TWICE DAILY as needed for irritations  . pantoprazole (PROTONIX) 40 MG tablet TAKE 1 TABLET TWICE DAILY BEFORE MEALS.  .Marland Kitchenpantoprazole (PROTONIX) 40 MG tablet TAKE 1 TABLET TWICE DAILY BEFORE MEALS.  . raloxifene (EVISTA) 60 MG tablet TAKE ONE TABLET BY MOUTH ONCE DAILY.  .Marland KitchenRespiratory Therapy Supplies (NEBULIZER/TUBING/MOUTHPIECE) KIT Disp one nebulizer machine, tubing set and mouthpiece kit  . sucralfate (CARAFATE) 1 g tablet TAKE 1g TABLET BY MOUTH 4 TIMES A DAY WITH MEALS AND AT BEDTIME as needed for stomach coating  . traZODone (DESYREL) 100 MG tablet Take 1 tablet (100 mg total) by mouth at bedtime.  . triamcinolone (KENALOG) 0.025 % cream APPLY 1 APPLICATION TOPICALLY TO AFFECTED AREA(S) TWICE DAILY as needed  . VESICARE 5 MG tablet TAKE ONE TABLET BY MOUTH ONCE DAILY.  . VOLTAREN 1 % GEL APPLY 2 GRAMS TO AFFECTED AREA FOUR TIMES A DAY.     Review of Systems  Constitutional: Negative.   HENT: Negative.   Eyes: Negative.   Respiratory: Negative.     Cardiovascular: Negative.   Gastrointestinal: Negative.   Endocrine: Negative.   Genitourinary: Negative.   Musculoskeletal: Negative.   Skin: Negative.   Allergic/Immunologic: Negative.   Neurological: Negative.   Hematological: Negative.   Psychiatric/Behavioral: Negative.   All other systems reviewed and are negative.      Objective:    Vitals:   10/13/18 1042  BP: 132/78  Pulse: 95  Resp: 16  Temp: 97.9 F (36.6 C)  TempSrc: Oral  SpO2: 92%  Height: 5' 5.5" (1.664 m)      Physical Exam Vitals signs and nursing note reviewed.  Constitutional:      General: She is not in acute distress.    Appearance: She is well-developed. She is obese. She is not toxic-appearing or diaphoretic.     Comments: Chronically ill-appearing, wheelchair, no respiratory distress  HENT:     Head: Normocephalic and atraumatic.     Right Ear: Hearing, ear canal and external ear normal. There is no impacted cerumen.     Left Ear: Hearing, tympanic membrane, ear canal and external ear normal. There is no impacted cerumen.     Ears:     Comments: TM dull and opaque with scant fleck of what appears to be dried blood on it, land marks obscured, no erythema, canal mildly edematous and erythematous, no discharge    Nose: Nose normal. No congestion or rhinorrhea.     Mouth/Throat:     Mouth: Mucous membranes are moist. Mucous membranes are not pale.     Pharynx: Oropharynx is clear. Uvula midline. No pharyngeal swelling, oropharyngeal exudate, posterior oropharyngeal erythema or uvula swelling.     Tonsils: No tonsillar abscesses.  Eyes:     General: No allergic shiner or scleral icterus.       Right eye: No discharge.        Left eye: No discharge.     Conjunctiva/sclera: Conjunctivae normal.     Pupils: Pupils are equal, round, and reactive to light.  Neck:     Musculoskeletal: Normal range of motion and neck supple.     Trachea: No tracheal deviation.  Cardiovascular:     Rate and  Rhythm: Normal  rate and regular rhythm.     Chest Wall: PMI is not displaced.     Pulses: Normal pulses.          Radial pulses are 2+ on the right side and 2+ on the left side.     Heart sounds: Normal heart sounds. Heart sounds not distant. No murmur. No friction rub. No gallop.   Pulmonary:     Effort: Pulmonary effort is normal. No respiratory distress.     Breath sounds: No stridor. No wheezing, rhonchi or rales.  Chest:     Chest wall: No tenderness.  Abdominal:     General: Bowel sounds are normal. There is no distension.     Palpations: Abdomen is soft.     Tenderness: There is no abdominal tenderness.  Musculoskeletal: Normal range of motion.     Right upper arm: She exhibits no tenderness, no bony tenderness, no swelling and no edema.     Left upper arm: She exhibits no tenderness, no bony tenderness, no swelling and no edema.  Skin:    General: Skin is warm and dry.     Capillary Refill: Capillary refill takes less than 2 seconds.     Coloration: Skin is not cyanotic, mottled or pale.     Findings: No erythema or rash.     Nails: There is no clubbing.   Neurological:     Mental Status: She is alert.     Motor: No abnormal muscle tone.     Comments: In wheelchair, moves upper extremities with normal coordination  Psychiatric:        Mood and Affect: Mood normal.        Behavior: Behavior normal. Behavior is cooperative.           Assessment & Plan:   Problem List Items Addressed This Visit      Cardiovascular and Mediastinum   Aortic atherosclerosis ([place])    See HLD - control lipids and BP, see if she needs ASA?         Respiratory   Asthma    Controlled on current meds, no recent exacerbation per pt Last steroids 08/18/18 for other allergy not asthma or copd exacerbation Pulmonology - she see next week.      Relevant Orders   Hemoglobin A1c (Completed)   COMPLETE METABOLIC PANEL WITH GFR (Completed)   CBC with Differential/Platelet (Completed)    Lipid panel (Completed)   COPD (chronic obstructive pulmonary disease) (HCC)    Still smoking, on advair, has inhaler and nebs, not using O2.  SpO2 92% today, respirations normal w/o increased WOB, follow up with pulmonology      Relevant Orders   Hemoglobin A1c (Completed)   COMPLETE METABOLIC PANEL WITH GFR (Completed)   CBC with Differential/Platelet (Completed)   Lipid panel (Completed)     Endocrine   Hypothyroidism, iatrogenic - Primary    Due for labs Concern with sx and with med administration that pt is hypothyroid at this time Pt instructed to take thyroid meds early am, no food or meds for 2 hours afterwards to get absorbed correctly May need to follow up with pharmacy to clarify "bubble pack"      Uncontrolled type 2 diabetes mellitus with insulin therapy ([place])    Pt non-compliant, also unclear of meds she is taking - all she knows is metformin. Not doing insulin - she says only when >250 Check labs, CMP, A1C Reviewed meds and administration  Other   Depression    Pt states well controlled on current meds, no SE      Hyperlipidemia    On multiple meds - pt states compliant, but again, unclear, need to clarify with pharmacy Lipitor, fenofibrate, zetia and naicin? No SE noted FLP and CMP       Relevant Orders   Hemoglobin A1c (Completed)   COMPLETE METABOLIC PANEL WITH GFR (Completed)   CBC with Differential/Platelet (Completed)   Lipid panel (Completed)   Tobacco use   Relevant Orders   Hemoglobin A1c (Completed)   COMPLETE METABOLIC PANEL WITH GFR (Completed)   CBC with Differential/Platelet (Completed)   Lipid panel (Completed)   Iron deficiency anemia   Relevant Orders   Hemoglobin A1c (Completed)   COMPLETE METABOLIC PANEL WITH GFR (Completed)   CBC with Differential/Platelet (Completed)   Lipid panel (Completed)   Obesity, Class III, BMI 40-49.9 (morbid obesity) ([place])    Other Visit Diagnoses    Type 2 diabetes mellitus without  complication, with long-term current use of insulin (HCC)       Relevant Orders   Hemoglobin A1c (Completed)   COMPLETE METABOLIC PANEL WITH GFR (Completed)   CBC with Differential/Platelet (Completed)   Lipid panel (Completed)   Hyperkalemia       Relevant Medications   sodium polystyrene (KAYEXALATE) powder   Chronic respiratory failure, unspecified whether with hypoxia or hypercapnia (HCC)   (Chronic)        Pt with complex medical history, new to my panel, have tried to review chart and clean up problem list.  BP controlled today - some recent problems with it but seemed to resolve - losartan and lasix - no SE, checking labs  HLD - several meds, unclear why?  Would like to simplify - increase statin and remove other meds  DM - pt noncompliant and sugars likely uncontrolled - check A1C, CMP  Respiratory failure with hx of asthma, COPD, current smoker - going to pulmonology soon for assessment - currently says shes controlled on advair only and PRN albuterol, singulair as well.   All acute visits I have seen her for in the past she is severely SOB after mildly worsening allergies or after URI - she's likely undercontrolled at this time and would probably do will with an additional maintenance inhaler.  Thyroid - arm swelling and hair loss - may be thyroid? Left arm swelling - does not look infected, does not appear edematous, not indurated - do not suspect upper extremity DVT, encouraged her to exercise arms in bed and stop laying on left arm only.  F/up if any firmness, redness or worsening and may need to order [place] or compression sleeve?  I cannot really even appreciate any asymmetry  Attempting to review chart, get to know her complex history, clean up problem list and adjust meds - since med list is extensive.    Delsa Grana, PA-C 10/14/18 6:49 PM

## 2018-10-14 ENCOUNTER — Encounter: Payer: Self-pay | Admitting: Family Medicine

## 2018-10-14 DIAGNOSIS — E1165 Type 2 diabetes mellitus with hyperglycemia: Secondary | ICD-10-CM | POA: Insufficient documentation

## 2018-10-14 DIAGNOSIS — I700 Atherosclerosis of aorta: Secondary | ICD-10-CM | POA: Insufficient documentation

## 2018-10-14 DIAGNOSIS — Z794 Long term (current) use of insulin: Secondary | ICD-10-CM

## 2018-10-14 DIAGNOSIS — IMO0002 Reserved for concepts with insufficient information to code with codable children: Secondary | ICD-10-CM | POA: Insufficient documentation

## 2018-10-14 LAB — CBC WITH DIFFERENTIAL/PLATELET
Absolute Monocytes: 1032 cells/uL — ABNORMAL HIGH (ref 200–950)
Basophils Absolute: 84 cells/uL (ref 0–200)
Basophils Relative: 0.7 %
Eosinophils Absolute: 156 cells/uL (ref 15–500)
Eosinophils Relative: 1.3 %
HCT: 40 % (ref 35.0–45.0)
Hemoglobin: 11.9 g/dL (ref 11.7–15.5)
Lymphs Abs: 2820 cells/uL (ref 850–3900)
MCH: 23.1 pg — ABNORMAL LOW (ref 27.0–33.0)
MCHC: 29.8 g/dL — ABNORMAL LOW (ref 32.0–36.0)
MCV: 77.7 fL — ABNORMAL LOW (ref 80.0–100.0)
MONOS PCT: 8.6 %
MPV: 10 fL (ref 7.5–12.5)
Neutro Abs: 7908 cells/uL — ABNORMAL HIGH (ref 1500–7800)
Neutrophils Relative %: 65.9 %
PLATELETS: 406 10*3/uL — AB (ref 140–400)
RBC: 5.15 10*6/uL — ABNORMAL HIGH (ref 3.80–5.10)
RDW: 19.1 % — ABNORMAL HIGH (ref 11.0–15.0)
Total Lymphocyte: 23.5 %
WBC: 12 10*3/uL — AB (ref 3.8–10.8)

## 2018-10-14 LAB — COMPLETE METABOLIC PANEL WITH GFR
AG Ratio: 1.2 (calc) (ref 1.0–2.5)
ALT: 12 U/L (ref 6–29)
AST: 15 U/L (ref 10–35)
Albumin: 3.7 g/dL (ref 3.6–5.1)
Alkaline phosphatase (APISO): 71 U/L (ref 37–153)
BUN: 18 mg/dL (ref 7–25)
CO2: 28 mmol/L (ref 20–32)
Calcium: 9.3 mg/dL (ref 8.6–10.4)
Chloride: 99 mmol/L (ref 98–110)
Creat: 0.88 mg/dL (ref 0.50–0.99)
GFR, Est African American: 82 mL/min/{1.73_m2} (ref >=60)
GFR, Est Non African American: 70 mL/min/{1.73_m2} (ref >=60)
Globulin: 3.2 g/dL (calc) (ref 1.9–3.7)
Glucose, Bld: 174 mg/dL — ABNORMAL HIGH (ref 65–99)
Potassium: 5.8 mmol/L — ABNORMAL HIGH (ref 3.5–5.3)
Sodium: 137 mmol/L (ref 135–146)
Total Bilirubin: 0.4 mg/dL (ref 0.2–1.2)
Total Protein: 6.9 g/dL (ref 6.1–8.1)

## 2018-10-14 LAB — HEMOGLOBIN A1C
Hgb A1c MFr Bld: 8.3 % of total Hgb — ABNORMAL HIGH (ref <5.7)
Mean Plasma Glucose: 192 (calc)
eAG (mmol/L): 10.6 (calc)

## 2018-10-14 LAB — LIPID PANEL
Cholesterol: 121 mg/dL (ref <200)
HDL: 22 mg/dL — AB (ref >=50)
Non-HDL Cholesterol (Calc): 99 mg/dL (calc) (ref <130)
Total CHOL/HDL Ratio: 5.5 (calc) — ABNORMAL HIGH (ref <5.0)
Triglycerides: 441 mg/dL — ABNORMAL HIGH (ref <150)

## 2018-10-14 LAB — T4, FREE: Free T4: 1 ng/dL (ref 0.8–1.8)

## 2018-10-14 LAB — TSH: TSH: 10.25 mIU/L — ABNORMAL HIGH (ref 0.40–4.50)

## 2018-10-14 MED ORDER — SODIUM POLYSTYRENE SULFONATE PO POWD: ORAL | 2 refills | Status: DC

## 2018-10-14 NOTE — Assessment & Plan Note (Signed)
Pt non-compliant, also unclear of meds she is taking - all she knows is metformin. Not doing insulin - she says only when >250 Check labs, CMP, A1C Reviewed meds and administration

## 2018-10-14 NOTE — Assessment & Plan Note (Signed)
See HLD - control lipids and BP, see if she needs ASA?

## 2018-10-14 NOTE — Assessment & Plan Note (Signed)
Pt states well controlled on current meds, no SE

## 2018-10-14 NOTE — Assessment & Plan Note (Signed)
Still smoking, on advair, has inhaler and nebs, not using O2.  SpO2 92% today, respirations normal w/o increased WOB, follow up with pulmonology

## 2018-10-14 NOTE — Assessment & Plan Note (Signed)
Due for labs Concern with sx and with med administration that pt is hypothyroid at this time Pt instructed to take thyroid meds early am, no food or meds for 2 hours afterwards to get absorbed correctly May need to follow up with pharmacy to clarify "bubble pack"

## 2018-10-14 NOTE — Assessment & Plan Note (Addendum)
On multiple meds - pt states compliant, but again, unclear, need to clarify with pharmacy Lipitor, fenofibrate, zetia and naicin? No SE noted FLP and CMP

## 2018-10-14 NOTE — Assessment & Plan Note (Signed)
Controlled on current meds, no recent exacerbation per pt Last steroids 08/18/18 for other allergy not asthma or copd exacerbation Pulmonology - she see next week.

## 2018-10-15 ENCOUNTER — Other Ambulatory Visit: Payer: Self-pay | Admitting: Physical Medicine & Rehabilitation

## 2018-10-15 ENCOUNTER — Telehealth: Payer: Self-pay | Admitting: *Deleted

## 2018-10-15 NOTE — Telephone Encounter (Signed)
Ok, thank you

## 2018-10-15 NOTE — Telephone Encounter (Signed)
Received call from Maudie Mercury [place] with [facility] [phone] telephone.   Requested order to re-certify patient for CAP services. VO given.

## 2018-10-19 ENCOUNTER — Ambulatory Visit: Payer: Medicaid Other | Admitting: Family Medicine

## 2018-10-27 ENCOUNTER — Encounter: Payer: Self-pay | Admitting: Family Medicine

## 2018-10-27 ENCOUNTER — Ambulatory Visit: Payer: Medicaid Other | Admitting: Family Medicine

## 2018-10-27 VITALS — BP 130/84 | HR 95 | Temp 97.4°F | Resp 16 | Ht 65.5 in

## 2018-10-27 DIAGNOSIS — Z794 Long term (current) use of insulin: Secondary | ICD-10-CM

## 2018-10-27 DIAGNOSIS — S20212A Contusion of left front wall of thorax, initial encounter: Secondary | ICD-10-CM

## 2018-10-27 DIAGNOSIS — M25512 Pain in left shoulder: Secondary | ICD-10-CM | POA: Diagnosis not present

## 2018-10-27 DIAGNOSIS — IMO0002 Reserved for concepts with insufficient information to code with codable children: Secondary | ICD-10-CM

## 2018-10-27 DIAGNOSIS — W19XXXA Unspecified fall, initial encounter: Secondary | ICD-10-CM | POA: Diagnosis not present

## 2018-10-27 DIAGNOSIS — E1165 Type 2 diabetes mellitus with hyperglycemia: Secondary | ICD-10-CM

## 2018-10-27 MED ORDER — GLUCOSE BLOOD VI STRP: ORAL_STRIP | 5 refills | Status: DC

## 2018-10-27 MED ORDER — INSULIN ASPART 100 UNIT/ML FLEXPEN: PEN_INJECTOR | SUBCUTANEOUS | 0 refills | Status: AC

## 2018-10-27 MED ORDER — ACCU-CHEK FASTCLIX LANCETS MISC: 5 refills | Status: DC

## 2018-10-27 MED ORDER — INSULIN PEN NEEDLE 29G X 8MM MISC: 5 refills | Status: AC

## 2018-10-27 NOTE — Progress Notes (Signed)
Patient ID: Lisa Ewing, female    DOB: [birth year], 63 y.o.   MRN: [id number]  PCP: Delsa Grana, PA-C  Chief Complaint  Patient presents with  . Consult    Patient in today to have evaluation and forms completed for her power wheel chair.  Patient also needs refills on testing strips, and lancets, novolog, and pen needles  . Fall    Patient had a fall on saturday when sitting to standing on the toilet. Patient fell and hit her left ribs, and left knee. Has c/o left rib pain    Subjective:   Lisa Ewing is a 63 y.o. female, presents to clinic with CC of DM f/up and fall with pain to left arm and side.   She was originally scheduled to do an eval for mobility and strength to get another electronic WC, but with fall, unable to do that today, so that appt has been rescheduled.  DM:  Pt has been non-compliant per our recent visit with her insulin - not taking daily, not taking as prescribed, I have taken over care of very complicated pt, Hx shows she is on sliding scale mealtime insulin, but she was only giving 4-6 units some days if blood sugar was over 250-300.  She says she was been giving as previously prescribed, and she is in need of refills. DM supplies - starting to do sliding scale and mealtime insulin Doing twice a day, checking BS and giving insulin, she doesn't always eat three times a day.  Her fasting blood sugar this am was 170, last night it was 136, and yesterday morning fasting BS was 225. No low sugars, no SE or difficulty with the dosing or with sliding scale.   She does not know if she was previously on basal insulin.  She was previously taking her thyroid medication at the same time as all her other meds, and she was chemically hypothyroid and symptomatic.  She started taking levothyroxine early in the morning on empty stomach, separate from all her other meds.  She has some decrease in loss of hair, but she still feels swollen.  Left arm has not worsened   Fall -  fell 4 days ago, she went to the bathroom by herself at home, she transferred to the toilet - has bars to hold onto, shes able to stand and pivot.  She was on the toilet when her phone rang and she tried to hurry and answer the phone, she slipped fell hitting left ribs hit bathtub, right face on wall and left knee hit the floor, no LOC.  She was able to get herself up and get out of restroom by herself.  Pain was most severe to left side of torse and left shoulder.  No contusion or swelling to face or anywhere else on her body.  No HA, neck pain, dizziness, LOC, N/V. Left ribs and abd is sore, pain gradually improving, moderate right now, worse with breathing, coughing and with palpation.  Her left arm and shoulder are sore with worse pain with lifting arm up more than 90 degrees.  She denies any bruising, bleeding or swelling.  All aches and pain have gradually gotten better.  No fever, no worsening SOB, no worsening cough from baseline.    25g 24m - pen needles that she is using at home for insulin.  Upper arm circumference - left arm larger than right, no edema, induration, redness, pain to skin to left arm.  No  color change, numbness, tingling or weakess.    Patient Active Problem List   Diagnosis Date Noted  . Uncontrolled type 2 diabetes mellitus with insulin therapy ([place]) 10/14/2018  . Aortic atherosclerosis ([place]) 10/14/2018  . Acute respiratory failure with hypoxia and hypercapnia ([place]) 04/13/2018  . Sleep apnea 04/12/2018  . Obesity, Class III, BMI 40-49.9 (morbid obesity) ([place]) 04/12/2018  . Fatty liver 05/14/2017  . Iron deficiency anemia 05/13/2017  . Tobacco use 11/14/2016  . Adhesive bursitis of left shoulder 03/04/2016  . Arthritis 03/01/2015  . Mucosal abnormality of stomach   . Mucosal abnormality of esophagus   . Vitamin D deficiency 01/02/2015  . Postlaminectomy syndrome, lumbar region 12/29/2014  . Chronic lumbar radiculopathy 12/29/2014  . Dysphagia, pharyngoesophageal  phase 07/18/2014  . Subacromial impingement 04/05/2014  . Trochanteric bursitis of left hip 12/15/2013  . Tachycardia 10/12/2013  . Hypothyroidism, iatrogenic 07/11/2013  . Allergic rhinitis   . Asthma   . COPD (chronic obstructive pulmonary disease) ([place])   . Depression   . Hyperlipidemia   . Osteoporosis   . Smoker   . Leukocytosis, unspecified 05/28/2013  . IBS (irritable bowel syndrome) 05/11/2013  . Gait disorder 01/14/2012  . Chronic leg pain 12/19/2011  . Chronic low back pain 12/19/2011  . Chronic neck pain 12/19/2011  . Shoulder pain, bilateral 12/19/2011  . Thoracic back pain 12/19/2011  . GERD 11/27/2009  . CONSTIPATION, CHRONIC 11/27/2009  . History of colonic polyps 11/27/2009  . [place] ALLERGY 11/27/2009  . Allergy to latex 11/27/2009     Prior to Admission medications   Medication Sig Start Date End Date Taking? Authorizing Provider  ACCU-CHEK FASTCLIX LANCETS MISC Use as directed 10/27/18  Yes Delsa Grana, PA-C  acyclovir (ZOVIRAX) 400 MG tablet TAKE ONE TABLET BY MOUTH ONCE DAILY. 06/28/18  Yes Susy Frizzle, MD  ADVAIR DISKUS 250-50 MCG/DOSE AEPB INHALE 1 PUFF TWICE DAILY; RINSE AFTER USE. 08/23/18  Yes Delsa Grana, PA-C  albuterol (PROVENTIL HFA) 108 (90 Base) MCG/ACT inhaler USE 2 PUFFS EVERY 4 TO 6 HOURS AS NEEDED FOR SHORTNESS OF BREATH 07/06/18  Yes Delsa Grana, PA-C  albuterol (PROVENTIL) (2.5 MG/3ML) 0.083% nebulizer solution INHALE 1 VIAL VIA NEBULIZER EVERY 4 HOURS AS NEEDED FOR WHEEZING/SHORTNESS OF BREATH. 08/24/18  Yes Delsa Grana, PA-C  atorvastatin (LIPITOR) 40 MG tablet Take 1 tablet (40 mg total) by mouth daily at 6 PM. 07/29/18  Yes Garnett Rekowski, Kristeen Miss, PA-C  calcitRIOL (ROCALTROL) 0.25 MCG capsule TAKE ONE CAPSULE BY MOUTH ONCE DAILY Patient taking differently: TAKE 0.79mg CAPSULE BY MOUTH ONCE DAILY 08/18/13  Yes PSusy Frizzle MD  cetirizine (ZYRTEC) 10 MG tablet TAKE ONE TABLET BY MOUTH ONCE DAILY. 09/28/18  Yes TDelsa Grana PA-C    escitalopram (LEXAPRO) 20 MG tablet TAKE 1 TABLET EVERY DAY. 09/28/18  Yes TDelsa Grana PA-C  ezetimibe (ZETIA) 10 MG tablet TAKE ONE TABLET BY MOUTH ONCE DAILY. 08/23/18  Yes TDelsa Grana PA-C  fenofibrate (TRICOR) 145 MG tablet TAKE 1 TABLET BY MOUTH ONCE A DAY. 08/23/18  Yes TDelsa Grana PA-C  furosemide (LASIX) 20 MG tablet TAKE ONE TABLET BY MOUTH ONCE DAILY. 08/23/18  Yes TDelsa Grana PA-C  gabapentin (NEURONTIN) 300 MG capsule TAKE 2 CAPSULES IN THE MORNING; 1 CAPSULE IN THE AFTERNOON AND 2 CAPSULES AT BEDTIME. 08/23/18  Yes Kirsteins, ALuanna Salk MD  glucose blood (ACCU-CHEK AVIVA PLUS) test strip USE TO TEST ONCE DAILY. 10/27/18  Yes TDelsa Grana PA-C  HYDROcodone-acetaminophen (NORCO) 10-325 MG tablet TAKE 1 TABLET BY  MOUTH 3 TIMES DAILY AS NEEDED FOR MODERATE PAIN. 09/30/18  Yes Meredith Staggers, MD  hyoscyamine (LEVSIN SL) 0.125 MG SL tablet DISSOLVE ONE TABLET UNDER THE TONGUE BEFORE MEALS AND AT BEDTIME AS NEEDED FOR ABDOMINAL CRAMPS OR DIARRHEA. 08/25/18  Yes Carlis Stable, NP  insulin aspart (NOVOLOG) 100 UNIT/ML FlexPen Before each meal 3 times a day, 140-199 - 2 units, 200-250 - 4 units, 251-299 - 6 units,  300-349 - 8 units,  350 or above 10 units. Insulin PEN if approved, provide syringes and needles if needed. 10/27/18  Yes Nazia Rhines, PA-C  JANUVIA 100 MG tablet TAKE ONE TABLET BY MOUTH ONCE DAILY. 09/28/18  Yes Delsa Grana, PA-C  levothyroxine (SYNTHROID, LEVOTHROID) 137 MCG tablet Take 1 tablet (137 mcg total) by mouth daily before breakfast. 08/25/18  Yes Colp, Modena Nunnery, MD  losartan (COZAAR) 50 MG tablet TAKE 1 TABLET BY MOUTH ONCE A DAY. 08/23/18  Yes Delsa Grana, PA-C  metFORMIN (GLUCOPHAGE) 1000 MG tablet TAKE 1 TABLET BY MOUTH TWICE DAILY WITH MEALS. 08/23/18  Yes Delsa Grana, PA-C  methocarbamol (ROBAXIN) 500 MG tablet TAKE (1) TABLET BY MOUTH TWICE A DAY AS NEEDED. 05/28/18  Yes Danella Sensing L, NP  mometasone (NASONEX) 50 MCG/ACT nasal spray Place 2 sprays into  the nose daily. 08/18/18  Yes Delsa Grana, PA-C  montelukast (SINGULAIR) 10 MG tablet TAKE (1) TABLET BY MOUTH AT BEDTIME. 09/28/18  Yes Delsa Grana, PA-C  niacin (NIASPAN) 1000 MG CR tablet TAKE 2 TABLETS BY MOUTH AT BEDTIME 09/28/18  Yes Nonnie Pickney, PA-C  nicotine (NICODERM CQ - DOSED IN MG/24 HOURS) 14 mg/24hr patch Place 1 patch (14 mg total) onto the skin daily. 07/16/18  Yes Delsa Grana, PA-C  nystatin (MYCOSTATIN) 100000 UNIT/ML suspension Take 21m 4 times daily after meals and at bedtime swish and swallow. 04/26/18  Yes DDena BilletB, PA-C  nystatin cream (MYCOSTATIN) APPLY 1 APPLICATION TO AFFECTED AREA(S) TWICE DAILY as needed for irritations 01/08/17  Yes Dixon, Mary B, PA-C  pantoprazole (PROTONIX) 40 MG tablet TAKE 1 TABLET TWICE DAILY BEFORE MEALS. 08/25/18  Yes GCarlis Stable NP  pantoprazole (PROTONIX) 40 MG tablet TAKE 1 TABLET TWICE DAILY BEFORE MEALS. 08/25/18  Yes GCarlis Stable NP  raloxifene (EVISTA) 60 MG tablet TAKE ONE TABLET BY MOUTH ONCE DAILY. 08/23/18  Yes TDelsa Grana PA-C  Respiratory Therapy Supplies (NEBULIZER/TUBING/MOUTHPIECE) KIT Disp one nebulizer machine, tubing set and mouthpiece kit 07/09/18  Yes TDelsa Grana PA-C  sodium polystyrene (KAYEXALATE) powder Take 15 g PO once 10/14/18  Yes Kendria Halberg, PA-C  sucralfate (CARAFATE) 1 g tablet TAKE 1g TABLET BY MOUTH 4 TIMES A DAY WITH MEALS AND AT BEDTIME as needed for stomach coating 01/08/17  Yes Dixon, Mary B, PA-C  traZODone (DESYREL) 100 MG tablet Take 1 tablet (100 mg total) by mouth at bedtime. 08/09/12  Yes Kirchmayer, Deanna, MD  triamcinolone (KENALOG) 0.025 % cream APPLY 1 APPLICATION TOPICALLY TO AFFECTED AREA(S) TWICE DAILY as needed 01/08/17  Yes Dixon, Mary B, PA-C  VESICARE 5 MG tablet TAKE ONE TABLET BY MOUTH ONCE DAILY. 09/28/18  Yes TDelsa Grana PA-C  VOLTAREN 1 % GEL APPLY 2 GRAMS TO AFFECTED AREA FOUR TIMES A DAY. 09/17/18  Yes TDanella SensingL, NP  EPINEPHrine 0.3 mg/0.3 mL IJ SOAJ injection INJECT 1 ML  INTO THE SKIN AS NEEDED FOR ALLERGIC REACTION. Patient not taking: Reported on 10/27/2018 06/25/18   DDena BilletB, PA-C  solifenacin (VESICARE) 5 MG tablet Take  10 mg by mouth daily.  03/03/13  [provider]     Allergies  Allergen Reactions  . Bee Venom Anaphylaxis  . Latex Shortness Of Breath  . Oxycodone-Acetaminophen Hives and Shortness Of Breath    Upset Stomach  . Penicillins Anaphylaxis    Throat swells Has patient had a PCN reaction causing immediate rash, facial/tongue/throat swelling, SOB or lightheadedness with hypotension: yes Has patient had a PCN reaction causing severe rash involving mucus membranes or skin necrosis: no Has patient had a PCN reaction that required hospitalization- yes at hospital Has patient had a PCN reaction occurring within the last 10 years: no If all of the above answers are "NO", then may proceed with Cephalosporin use.   . Rocephin [Ceftriaxone Sodium In Dextrose] Swelling    Tingling around lips  . Shellfish Allergy Anaphylaxis    Throat swells   . Codeine Nausea Only  . Metoclopramide Hcl     Doesn't recall type of reaction  . Pregabalin Swelling  . Sulfonamide Derivatives     Tongue swells   . Adhesive [Tape] Rash  . Other Swelling, Itching and Rash    Almonds. Bananas cause an asthma attack.   . Wellbutrin [Bupropion] Rash    Hives, red whelps     Family History  Problem Relation Age of Onset  . Hyperlipidemia Mother   . Diabetes Father   . Breast cancer Maternal Aunt   . Throat cancer Maternal Grandmother   . Colon cancer Neg Hx      Social History   Socioeconomic History  . Marital status: Divorced    Spouse name: Not on file  . Number of children: Not on file  . Years of education: Not on file  . Highest education level: Not on file  Occupational History  . Not on file  Social Needs  . Financial resource strain: Not on file  . Food insecurity:    Worry: Not on file    Inability: Not on file  .  Transportation needs:    Medical: Not on file    Non-medical: Not on file  Tobacco Use  . Smoking status: Current Every Day Smoker    Packs/day: 1.00    Years: 42.00    Pack years: 42.00    Types: Cigarettes  . Smokeless tobacco: Never Used  Substance and Sexual Activity  . Alcohol use: Not Currently    Alcohol/week: 0.0 standard drinks  . Drug use: No  . Sexual activity: Never    Birth control/protection: Surgical  Lifestyle  . Physical activity:    Days per week: Not on file    Minutes per session: Not on file  . Stress: Not on file  Relationships  . Social connections:    Talks on phone: Not on file    Gets together: Not on file    Attends religious service: Not on file    Active member of club or organization: Not on file    Attends meetings of clubs or organizations: Not on file    Relationship status: Not on file  . Intimate partner violence:    Fear of current or ex partner: Not on file    Emotionally abused: Not on file    Physically abused: Not on file    Forced sexual activity: Not on file  Other Topics Concern  . Not on file  Social History Narrative  . Not on file     Review of Systems  Constitutional: Negative.  HENT: Negative.   Eyes: Negative.   Respiratory: Negative.   Cardiovascular: Negative.   Gastrointestinal: Negative.   Endocrine: Negative.   Genitourinary: Negative.   Musculoskeletal: Negative.   Skin: Negative.   Allergic/Immunologic: Negative.   Neurological: Negative.   Hematological: Negative.   Psychiatric/Behavioral: Negative.   All other systems reviewed and are negative.      Objective:    Vitals:   10/27/18 1058  BP: 130/84  Pulse: 95  Resp: 16  Temp: (!) 97.4 F (36.3 C)  TempSrc: Oral  SpO2: 91%  Height: 5' 5.5" (1.664 m)      Physical Exam Vitals signs and nursing note reviewed.  Constitutional:      General: She is not in acute distress.    Appearance: She is well-developed. She is obese. She is not  toxic-appearing or diaphoretic.     Comments: Chronically ill-appearing, pt in electric wheelchair, NAD  HENT:     Head: Normocephalic and atraumatic.     Right Ear: Hearing, ear canal and external ear normal. There is no impacted cerumen.     Left Ear: Hearing, tympanic membrane, ear canal and external ear normal. There is no impacted cerumen.     Ears:     Comments: TM dull and opaque with scant fleck of what appears to be dried blood on it, land marks obscured, no erythema, canal mildly edematous and erythematous, no discharge    Nose: Nose normal. No congestion or rhinorrhea.     Mouth/Throat:     Mouth: Mucous membranes are moist. Mucous membranes are not pale.     Pharynx: Oropharynx is clear. Uvula midline. No pharyngeal swelling, oropharyngeal exudate, posterior oropharyngeal erythema or uvula swelling.     Tonsils: No tonsillar abscesses.  Eyes:     General: No allergic shiner or scleral icterus.       Right eye: No discharge.        Left eye: No discharge.     Conjunctiva/sclera: Conjunctivae normal.     Pupils: Pupils are equal, round, and reactive to light.  Neck:     Musculoskeletal: Normal range of motion and neck supple.     Trachea: No tracheal deviation.  Cardiovascular:     Rate and Rhythm: Normal rate and regular rhythm.     Chest Wall: PMI is not displaced.     Pulses: Normal pulses.          Radial pulses are 2+ on the right side and 2+ on the left side.     Heart sounds: Normal heart sounds. Heart sounds not distant. No murmur. No friction rub. No gallop.   Pulmonary:     Effort: Pulmonary effort is normal. No tachypnea or respiratory distress.     Breath sounds: No stridor. No wheezing, rhonchi or rales.  Chest:     Chest wall: Tenderness present. No deformity, swelling, crepitus or edema. There is no dullness to percussion.       Comments: Area circled is generally ttp - no bruising, ecchymosis, erythema, abrasion Abdominal:     General: Bowel sounds are  normal. There is no distension.     Palpations: Abdomen is soft.     Tenderness: There is no abdominal tenderness.  Musculoskeletal:     Left shoulder: She exhibits decreased range of motion and tenderness. She exhibits no bony tenderness, no swelling, no effusion, no deformity and normal pulse.     Right upper arm: She exhibits no tenderness, no bony tenderness, no swelling  and no edema.     Left upper arm: She exhibits no tenderness, no bony tenderness, no swelling and no edema.     Comments: Left upper arm circumference measured at largest point 64 cm Right upper arm  Measured at largest point 54 cm  Skin:    General: Skin is warm and dry.     Capillary Refill: Capillary refill takes less than 2 seconds.     Coloration: Skin is not cyanotic, mottled or pale.     Findings: No erythema or rash.     Nails: There is no clubbing.   Neurological:     Mental Status: She is alert.     Motor: No abnormal muscle tone.     Comments: In wheelchair, moves upper extremities with normal coordination  Psychiatric:        Mood and Affect: Mood normal.        Behavior: Behavior normal. Behavior is cooperative.           Assessment & Plan:      ICD-10-CM   1. Uncontrolled type 2 diabetes mellitus with insulin therapy ([facility]) E11.65 glucose blood (ACCU-CHEK AVIVA PLUS) test strip   Z79.4 ACCU-CHEK FASTCLIX LANCETS MISC    insulin aspart (NOVOLOG) 100 UNIT/ML FlexPen    Insulin Pen Needle 29G X 8MM MISC   continue insulin as previously Rx'd, recheck sugar log at next appt (4 weeks) adjust - may need basal insulin?  2. Fall, initial encounter W19.XXXA   3. Chest wall contusion, left, initial encounter S20.212A    deep breaths every hour - incentive spirometry, f/up if any fever, worsening breathing  4. Acute pain of left shoulder M25.512    rest, ice, suspect strain/sprain will recheck at next appt, f/u sooner if worsening    Still trying to get to know patient now under my care and clean up  med list/adjust meds/therapy as able.  Today we d/c'd fenofibrate, pt to continue lipitor and zetia.  May want to increase lipitor and d/c zetia if pt can improve DM control, will address with next fasting labs  Pt to call us in 2 weeks with sugars, and 4 week follow up.   Delsa Grana, PA-C 10/27/18 11:36 AM

## 2018-10-27 NOTE — Patient Instructions (Addendum)
Call me with your Pen needle size if you can see it  Call me in 2 weeks with your blood sugar readings  Follow up appointment in 4 weeks to recheck thyroid and diabetes.  Stop taking fenofibrate and keep taking lipitor and zetia

## 2018-11-01 ENCOUNTER — Encounter: Payer: Self-pay | Admitting: Family Medicine

## 2018-11-02 ENCOUNTER — Other Ambulatory Visit: Payer: Self-pay | Admitting: Family Medicine

## 2018-11-02 ENCOUNTER — Other Ambulatory Visit: Payer: Self-pay | Admitting: Physical Medicine & Rehabilitation

## 2018-11-02 ENCOUNTER — Encounter: Payer: Self-pay | Admitting: Registered Nurse

## 2018-11-02 ENCOUNTER — Encounter: Payer: Medicaid Other | Attending: Physical Medicine and Rehabilitation | Admitting: Registered Nurse

## 2018-11-02 VITALS — BP 132/79 | HR 99

## 2018-11-02 DIAGNOSIS — M7061 Trochanteric bursitis, right hip: Secondary | ICD-10-CM

## 2018-11-02 DIAGNOSIS — M810 Age-related osteoporosis without current pathological fracture: Secondary | ICD-10-CM | POA: Insufficient documentation

## 2018-11-02 DIAGNOSIS — F17210 Nicotine dependence, cigarettes, uncomplicated: Secondary | ICD-10-CM | POA: Diagnosis not present

## 2018-11-02 DIAGNOSIS — J449 Chronic obstructive pulmonary disease, unspecified: Secondary | ICD-10-CM | POA: Insufficient documentation

## 2018-11-02 DIAGNOSIS — K219 Gastro-esophageal reflux disease without esophagitis: Secondary | ICD-10-CM | POA: Diagnosis not present

## 2018-11-02 DIAGNOSIS — M25551 Pain in right hip: Secondary | ICD-10-CM | POA: Insufficient documentation

## 2018-11-02 DIAGNOSIS — M7918 Myalgia, other site: Secondary | ICD-10-CM

## 2018-11-02 DIAGNOSIS — M7542 Impingement syndrome of left shoulder: Secondary | ICD-10-CM

## 2018-11-02 DIAGNOSIS — M75101 Unspecified rotator cuff tear or rupture of right shoulder, not specified as traumatic: Secondary | ICD-10-CM | POA: Diagnosis not present

## 2018-11-02 DIAGNOSIS — E785 Hyperlipidemia, unspecified: Secondary | ICD-10-CM | POA: Insufficient documentation

## 2018-11-02 DIAGNOSIS — M47816 Spondylosis without myelopathy or radiculopathy, lumbar region: Secondary | ICD-10-CM | POA: Insufficient documentation

## 2018-11-02 DIAGNOSIS — Z5181 Encounter for therapeutic drug level monitoring: Secondary | ICD-10-CM

## 2018-11-02 DIAGNOSIS — E039 Hypothyroidism, unspecified: Secondary | ICD-10-CM | POA: Diagnosis not present

## 2018-11-02 DIAGNOSIS — M5412 Radiculopathy, cervical region: Secondary | ICD-10-CM

## 2018-11-02 DIAGNOSIS — M542 Cervicalgia: Secondary | ICD-10-CM | POA: Diagnosis not present

## 2018-11-02 DIAGNOSIS — M961 Postlaminectomy syndrome, not elsewhere classified: Secondary | ICD-10-CM

## 2018-11-02 DIAGNOSIS — Z79891 Long term (current) use of opiate analgesic: Secondary | ICD-10-CM

## 2018-11-02 DIAGNOSIS — M25512 Pain in left shoulder: Secondary | ICD-10-CM | POA: Diagnosis not present

## 2018-11-02 DIAGNOSIS — E119 Type 2 diabetes mellitus without complications: Secondary | ICD-10-CM | POA: Diagnosis not present

## 2018-11-02 DIAGNOSIS — M79604 Pain in right leg: Secondary | ICD-10-CM | POA: Insufficient documentation

## 2018-11-02 DIAGNOSIS — G8929 Other chronic pain: Secondary | ICD-10-CM | POA: Insufficient documentation

## 2018-11-02 DIAGNOSIS — M47814 Spondylosis without myelopathy or radiculopathy, thoracic region: Secondary | ICD-10-CM | POA: Diagnosis not present

## 2018-11-02 DIAGNOSIS — IMO0002 Reserved for concepts with insufficient information to code with codable children: Secondary | ICD-10-CM

## 2018-11-02 DIAGNOSIS — M7062 Trochanteric bursitis, left hip: Secondary | ICD-10-CM

## 2018-11-02 DIAGNOSIS — M5416 Radiculopathy, lumbar region: Secondary | ICD-10-CM

## 2018-11-02 DIAGNOSIS — G894 Chronic pain syndrome: Secondary | ICD-10-CM

## 2018-11-02 DIAGNOSIS — M546 Pain in thoracic spine: Secondary | ICD-10-CM

## 2018-11-02 MED ORDER — HYDROCODONE-ACETAMINOPHEN 10-325 MG PO TABS: ORAL_TABLET | ORAL | 0 refills | Status: DC
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
## 2018-11-05 ENCOUNTER — Ambulatory Visit: Payer: Medicaid Other | Admitting: Internal Medicine

## 2018-11-09 ENCOUNTER — Ambulatory Visit: Payer: Medicaid Other | Admitting: Urology

## 2018-11-11 ENCOUNTER — Other Ambulatory Visit: Payer: Self-pay | Admitting: Family Medicine

## 2018-11-12 ENCOUNTER — Other Ambulatory Visit: Payer: Self-pay | Admitting: Registered Nurse

## 2018-11-12 DIAGNOSIS — M25512 Pain in left shoulder: Secondary | ICD-10-CM

## 2018-11-12 DIAGNOSIS — M25561 Pain in right knee: Principal | ICD-10-CM

## 2018-11-12 DIAGNOSIS — M7062 Trochanteric bursitis, left hip: Secondary | ICD-10-CM

## 2018-11-12 DIAGNOSIS — G8929 Other chronic pain: Secondary | ICD-10-CM

## 2018-11-23 ENCOUNTER — Telehealth: Payer: Self-pay | Admitting: Family Medicine

## 2018-11-24 ENCOUNTER — Ambulatory Visit: Payer: Medicaid Other | Admitting: Family Medicine

## 2018-11-24 NOTE — Telephone Encounter (Signed)
Patient was notified not to come in today by front staff as Kristeen Miss has asked the front desk this morning.

## 2018-11-26 ENCOUNTER — Telehealth: Payer: Self-pay

## 2018-11-26 MED ORDER — HYDROCODONE-ACETAMINOPHEN 10-325 MG PO TABS: ORAL_TABLET | ORAL | 0 refills | Status: DC

## 2018-11-26 NOTE — Telephone Encounter (Signed)
Patient called stating that she rescheduled her appt to 12/28/2018 due to her having health problems and PCP does not want her coming out due to coronavirus. Need refill on Hydrocodone/APAP last filled 11/02/2018 #90 according to PMP aware.

## 2018-11-26 NOTE — Telephone Encounter (Signed)
Hydrocodone e-scribed, Ms. Cadman appointment was rescheduled.

## 2018-11-27 IMAGING — US US ABDOMEN LIMITED
1 series · 14 of 25 positions shown · non-contrast
Comparison: CT 03/09/2017 .

CLINICAL DATA: Epigastric and right upper quadrant pain.

EXAM:
ULTRASOUND ABDOMEN LIMITED RIGHT UPPER QUADRANT

[Series 1: us abdomen limited · 0.25mm/px · 14 of 41 slices shown]
[im 1/41]
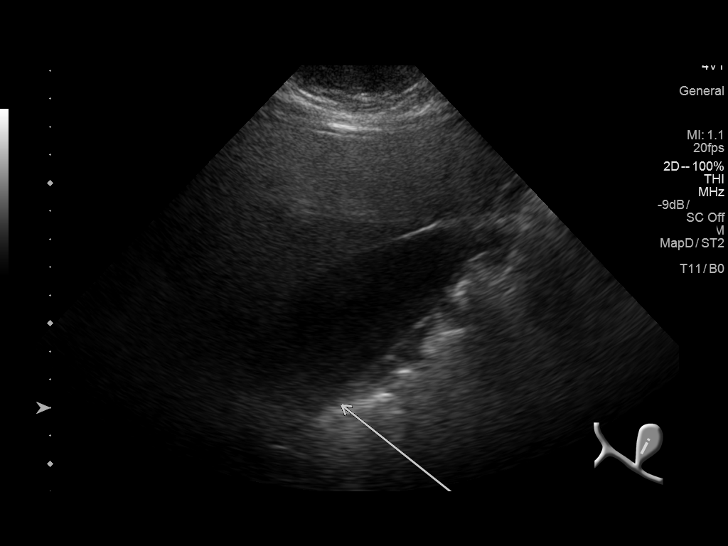
[im 4/41]
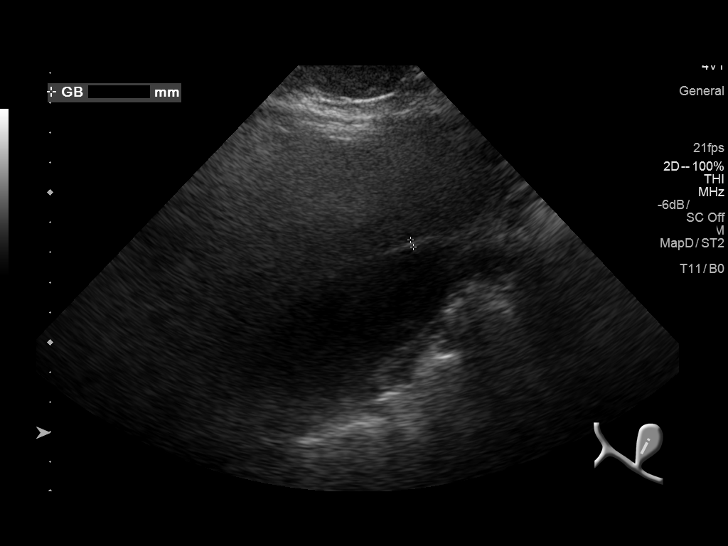
[im 7/41]
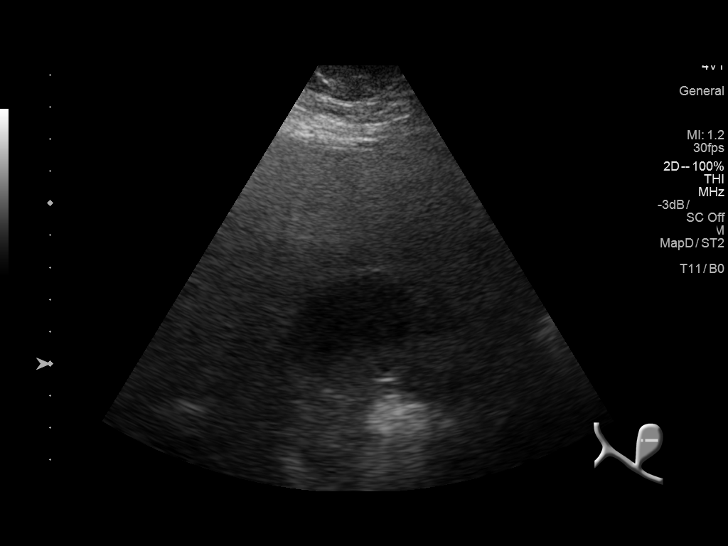
[im 11/41]
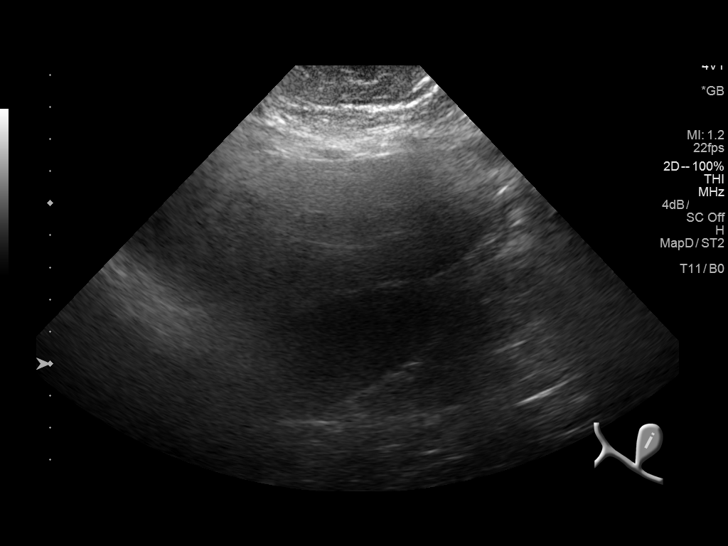
[im 14/41]
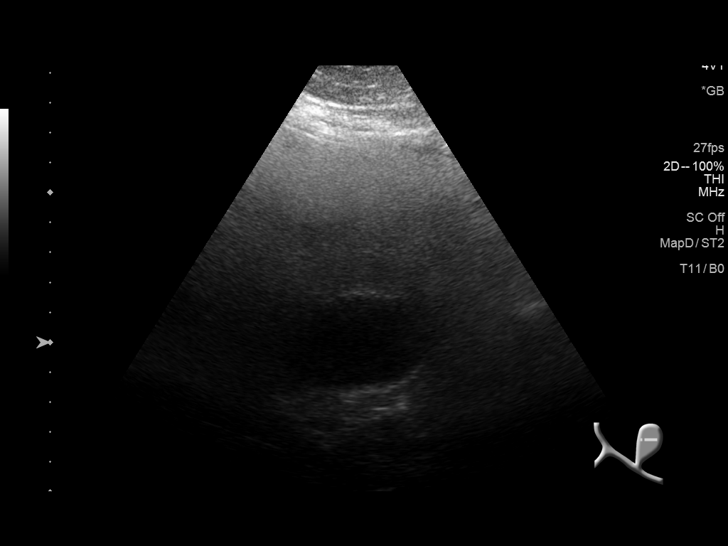
[im 16/41]
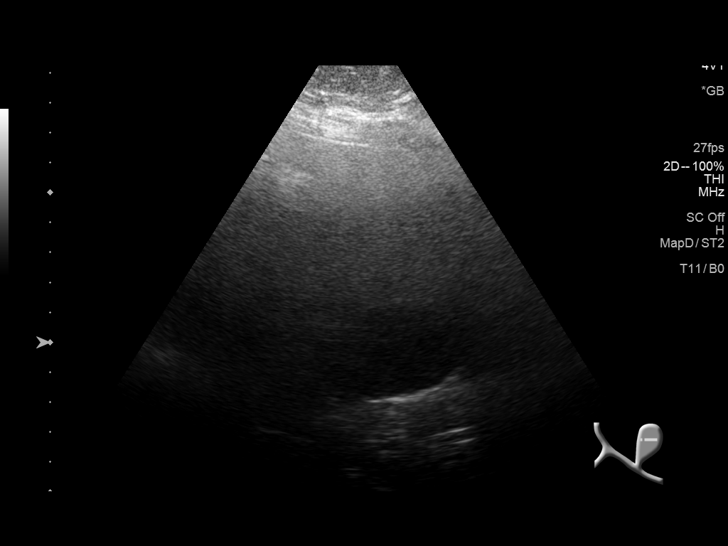
[im 19/41]
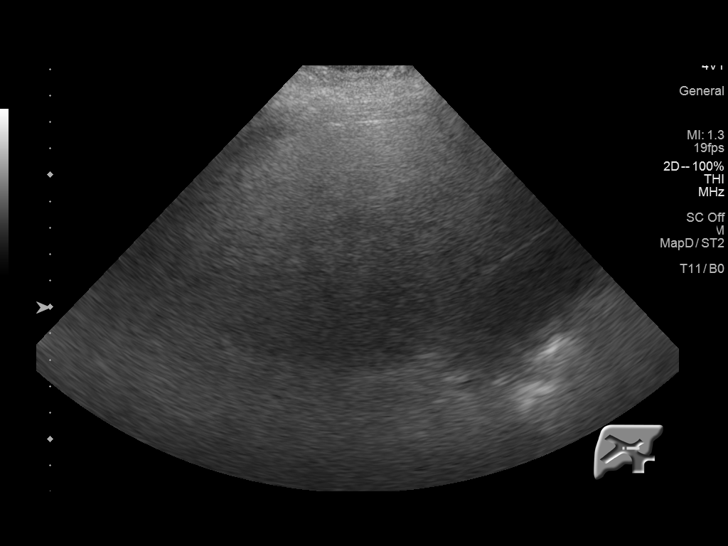
[im 22/41]
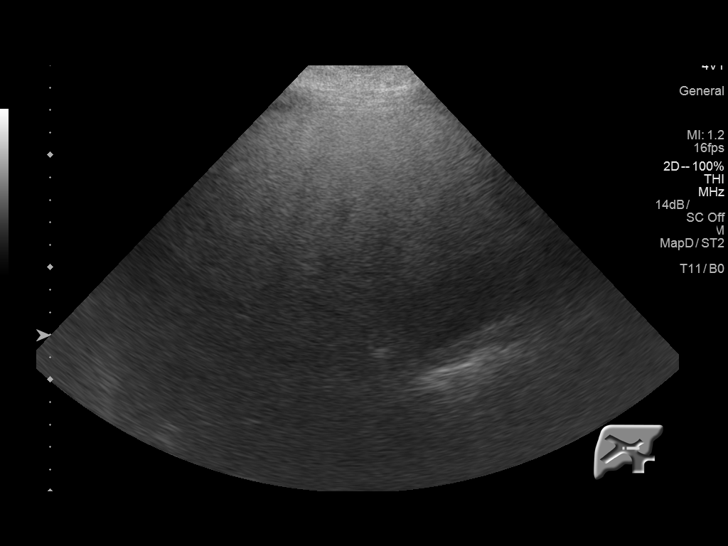
[im 26/41]
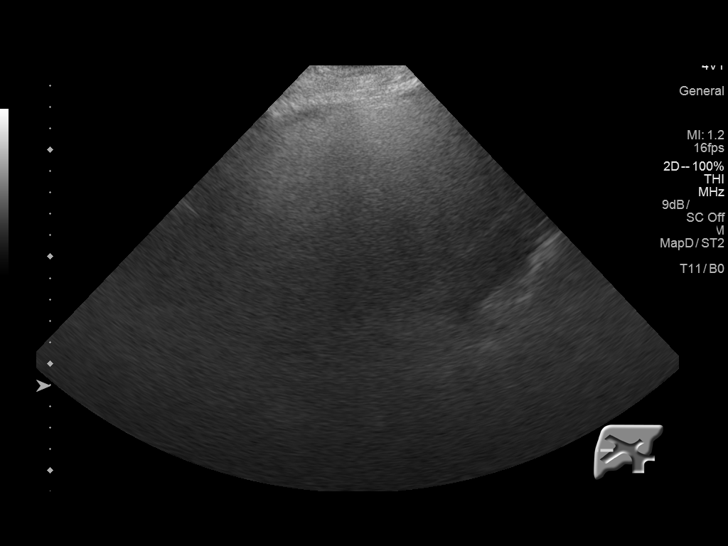
[im 27/41]
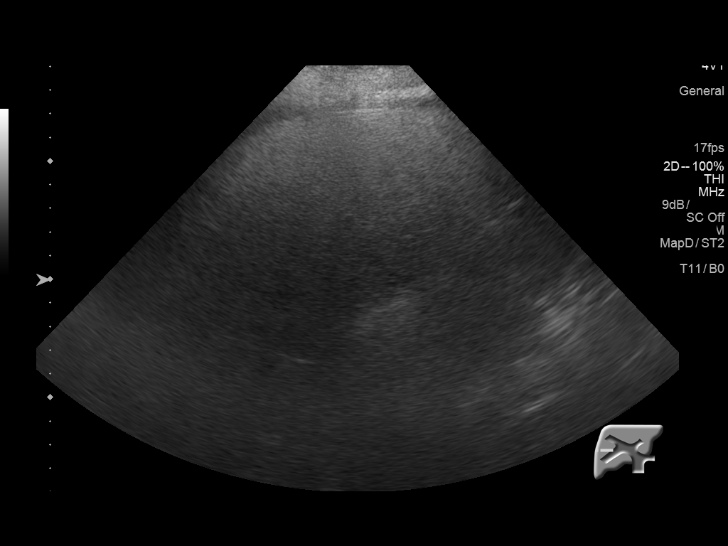
[im 31/41]
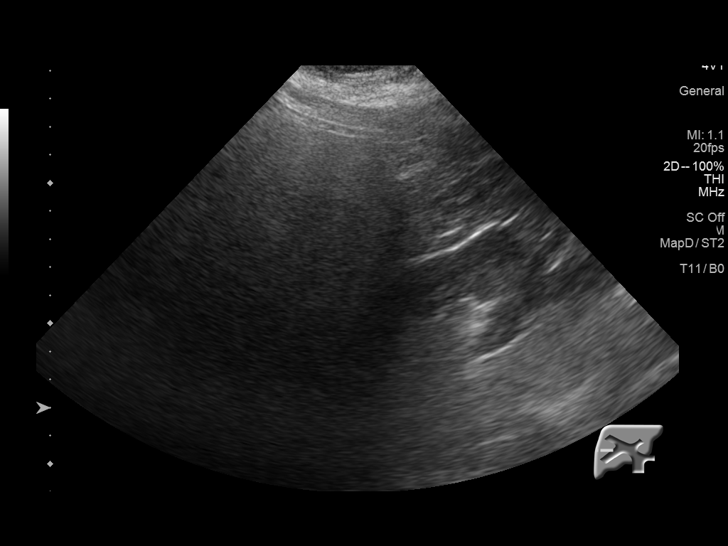
[im 34/41]
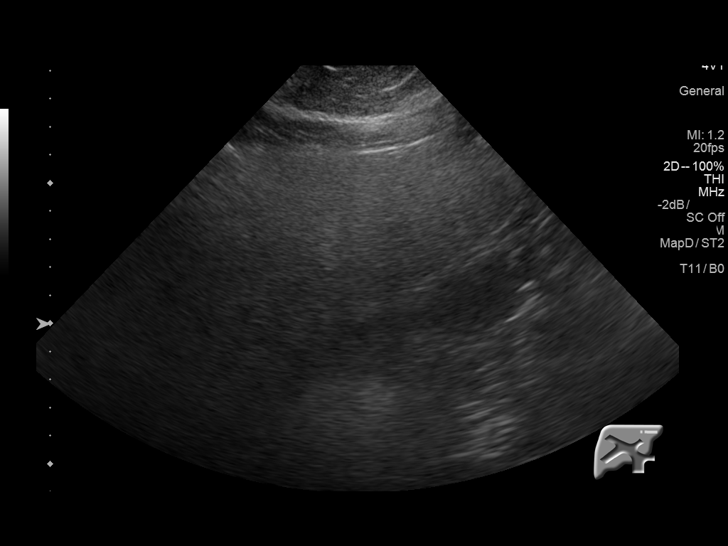
[im 37/41]
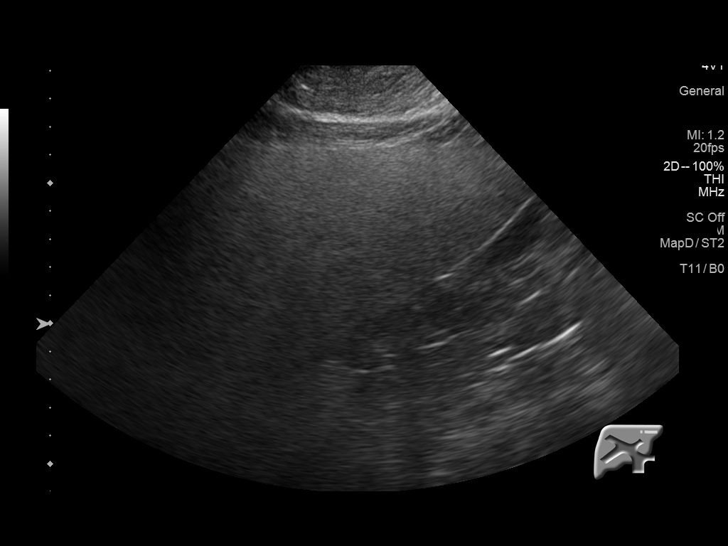
[im 41/41]
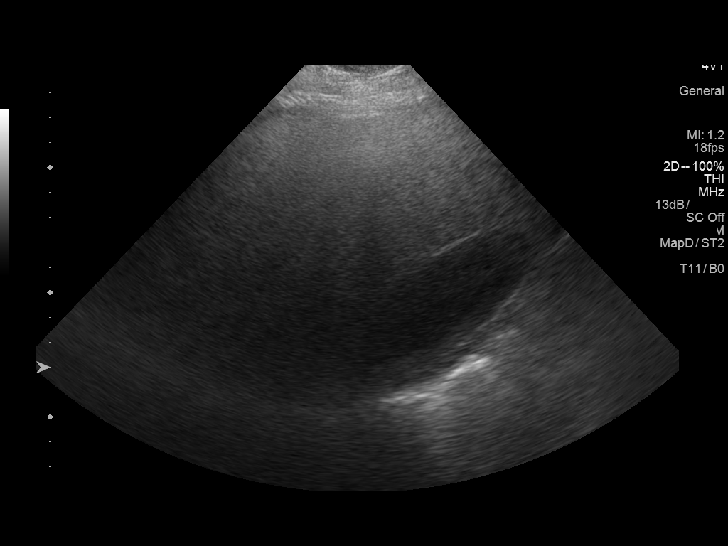

[14 of 25 positions shown; findings below may reference images not displayed]

FINDINGS: Gallbladder:

Mild sludge noted. Gallbladder wall thickness 2.6 mm. Negative
Murphy sign.

Common bile duct:

Diameter: 3.9 mm

Liver:

Increase echogenicity consistent fatty infiltration and/or
hepatocellular disease. No focal hepatic abnormality identified.
Exam was limited due to bowel gas and body habitus.
IMPRESSION: 1. Mild gallbladder sludge. Negative Murphy sign. No biliary
distention.

2. Increased hepatic echogenicity consistent fatty infiltration
and/or hepatocellular disease. No focal hepatic abnormality
identified.

Exam was limited due to bowel gas and patient's body habitus.

## 2018-11-30 ENCOUNTER — Ambulatory Visit: Payer: Medicaid Other | Admitting: Physical Medicine & Rehabilitation

## 2018-12-09 ENCOUNTER — Ambulatory Visit (INDEPENDENT_AMBULATORY_CARE_PROVIDER_SITE_OTHER): Payer: Medicaid Other | Admitting: Gastroenterology

## 2018-12-09 ENCOUNTER — Encounter: Payer: Self-pay | Admitting: Gastroenterology

## 2018-12-09 ENCOUNTER — Other Ambulatory Visit: Payer: Self-pay

## 2018-12-09 DIAGNOSIS — R110 Nausea: Secondary | ICD-10-CM | POA: Diagnosis not present

## 2018-12-09 DIAGNOSIS — K219 Gastro-esophageal reflux disease without esophagitis: Secondary | ICD-10-CM

## 2018-12-09 DIAGNOSIS — D509 Iron deficiency anemia, unspecified: Secondary | ICD-10-CM | POA: Diagnosis not present

## 2018-12-09 MED ORDER — ONDANSETRON HCL 4 MG PO TABS: 4.0000 mg | ORAL_TABLET | Freq: Three times a day (TID) | ORAL | 0 refills | Status: DC | PRN

## 2018-12-09 NOTE — Patient Instructions (Signed)
1. Rx for Zofran sent to your pharmacy to use as needed. 2. Take over-the-counter iron daily. 3. We will check your labs soon as the COVID-19 crisis is improved.  Take the forms to the lab when you feel comfortable going in the next couple of months.  Once we get your lab results back we will decide if your colonoscopy needs to be done sooner than next year. 4. For better management of your acid reflux, try taking your second dose of pantoprazole about a half an hour before your evening meal. 5. Please call with any questions or concerns.
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
## 2018-12-13 ENCOUNTER — Telehealth: Payer: Self-pay | Admitting: *Deleted

## 2018-12-13 ENCOUNTER — Other Ambulatory Visit: Payer: Self-pay | Admitting: Family Medicine

## 2018-12-13 ENCOUNTER — Other Ambulatory Visit: Payer: Self-pay | Admitting: Nurse Practitioner

## 2018-12-13 NOTE — Telephone Encounter (Signed)
Requested Prescriptions   Pending Prescriptions Disp Refills  . acyclovir (ZOVIRAX) 400 MG tablet [Pharmacy Med Name: ACYCLOVIR 400 MG TABLET] 30 tablet 0    Sig: TAKE ONE TABLET BY MOUTH ONCE DAILY.  . metFORMIN (GLUCOPHAGE) 1000 MG tablet [Pharmacy Med Name: METFORMIN HCL 1000 MG TABLET] 60 tablet 0    Sig: TAKE ONE TABLET BY MOUTH TWICE A DAY  . losartan (COZAAR) 50 MG tablet [Pharmacy Med Name: LOSARTAN POTASSIUM 50 MG TAB] 30 tablet 0    Sig: TAKE 1 TABLET BY MOUTH ONCE A DAY.  Marland Kitchen ezetimibe (ZETIA) 10 MG tablet [Pharmacy Med Name: EZETIMIBE 10 MG TABLET] 30 tablet 0    Sig: TAKE 1 TABLET BY MOUTH ONCE A DAY.  . raloxifene (EVISTA) 60 MG tablet [Pharmacy Med Name: RALOXIFENE HCL 60 MG TABLET] 30 tablet 0    Sig: TAKE 1 TABLET BY MOUTH ONCE A DAY.  Marland Kitchen ADVAIR DISKUS 250-50 MCG/DOSE AEPB [Pharmacy Med Name: ADVAIR DISKUS 250/50] 60 each 0    Sig: INHALE 1 PUFF 2 TIMES A DAY RINSE AFTER USE.  Marland Kitchen JANUVIA 100 MG tablet [Pharmacy Med Name: JANUVIA 100 MG TABLET] 30 tablet 0    Sig: TAKE ONE TABLET BY MOUTH ONCE DAILY.  . VESICARE 5 MG tablet [Pharmacy Med Name: VESICARE 5 MG TABLET] 30 tablet 0    Sig: TAKE ONE TABLET BY MOUTH ONCE DAILY.  . niacin (NIASPAN) 1000 MG CR tablet [Pharmacy Med Name: NIASPAN 1000MG  TABLET SA] 60 tablet 0    Sig: TAKE 2 TABLETS BY MOUTH AT BEDTIME  . cetirizine (ZYRTEC) 10 MG tablet [Pharmacy Med Name: CETIRIZINE HCL 10MG  TABLET] 30 tablet 0    Sig: TAKE ONE TABLET BY MOUTH ONCE DAILY.  Marland Kitchen escitalopram (LEXAPRO) 20 MG tablet [Pharmacy Med Name: ESCITALOPRAM 20 MG TABLETS] 30 tablet 0    Sig: TAKE 1 TABLET EVERY DAY.  . furosemide (LASIX) 20 MG tablet [Pharmacy Med Name: FUROSEMIDE 20 MG TABLETS] 30 tablet 0    Sig: TAKE 1 TABLET BY MOUTH ONCE A DAY.  Marland Kitchen atorvastatin (LIPITOR) 40 MG tablet [Pharmacy Med Name: ATORVASTATIN 40MG  TABLETS] 30 tablet 0    Sig: TAKE 1 TABLET BY MOUTH ONCE DAILY AT 6:00 PM.   Last OV 10/27/2018

## 2018-12-13 NOTE — Telephone Encounter (Signed)
Do you know if we are able to get labs done for a pt like this at home?  Her TSH was very high and we adjusted the med administration.  I would like to recheck TSH, but she is so high risk she should not be coming in, and still probably too high risk to go to [facility].  As well.  Just wondering if there is anything you could help find out?  I'd appreciate it.

## 2018-12-13 NOTE — Telephone Encounter (Signed)
Noted  

## 2018-12-13 NOTE — Telephone Encounter (Signed)
Hey I'm going to have to review her meds before I refill.  She was on 4 different cholesterol meds at one point and she just went to GI so I will check and refill them tomorrow when in office.  thanks

## 2018-12-13 NOTE — Telephone Encounter (Signed)
Yes, please.  Agree with [facility] re-certification for CAP services What do I need to do?  Or Lisa Ewing and I can look at this tomorrow

## 2018-12-13 NOTE — Telephone Encounter (Signed)
Here's another Rx.

## 2018-12-13 NOTE — Telephone Encounter (Signed)
Received call from Evalee Jefferson, [facility] with [place] [phone] telephone.   Requested verbal order to re-certify patient for CAP services.   Please advise.

## 2018-12-14 NOTE — Telephone Encounter (Signed)
Spoke with Verdis Frederickson from [facility] and gave verbal orders to re-certify pt for CAP services.

## 2018-12-14 NOTE — Progress Notes (Signed)
CC'ED TO PCP 

## 2018-12-14 NOTE — Telephone Encounter (Signed)
I left a VM on [facility]) phone to return my call.

## 2018-12-22 ENCOUNTER — Telehealth: Payer: Self-pay | Admitting: Family Medicine

## 2018-12-22 ENCOUNTER — Other Ambulatory Visit: Payer: Self-pay | Admitting: Family Medicine

## 2018-12-22 DIAGNOSIS — E032 Hypothyroidism due to medicaments and other exogenous substances: Secondary | ICD-10-CM

## 2018-12-22 MED ORDER — LEVOTHYROXINE SODIUM 150 MCG PO TABS: 150.0000 ug | ORAL_TABLET | Freq: Every day | ORAL | 1 refills | Status: DC

## 2018-12-22 NOTE — Progress Notes (Signed)
Labs received  TSH rechecked 11.170 (RR 0.450-4.50) through [facility] - will be scanned into chart Will increase levothyroxine medicine from 137 mcg to 150 mcg take each morning on empty stomach, do not eat for 1 hour afterwards, and morning meds must be taken at least one hour afterwards.  Will need TSH rechecked in 6 weeks. Order entered for clinic - can try and schedule f/up appt in office for that, and cancel and do telephone encounter if still dealing with COVID  Early June f/up labs and OV  Pt to be notified  Problem List Items Addressed This Visit      Endocrine   Hypothyroidism, iatrogenic - Primary   Relevant Medications   levothyroxine (SYNTHROID, LEVOTHROID) 150 MCG tablet   Other Relevant Orders   TSH     Delsa Grana, PA-C 12/22/18 10:56 AM

## 2018-12-22 NOTE — Telephone Encounter (Signed)
Left VM for pt to call the clinic. 

## 2018-12-22 NOTE — Telephone Encounter (Signed)
Pt notified Verbalizes understanding 

## 2018-12-28 ENCOUNTER — Ambulatory Visit: Payer: Medicaid Other | Admitting: Physical Medicine & Rehabilitation

## 2018-12-28 ENCOUNTER — Ambulatory Visit: Payer: Medicaid Other

## 2018-12-28 ENCOUNTER — Encounter: Payer: Self-pay | Admitting: Registered Nurse

## 2018-12-28 ENCOUNTER — Encounter: Payer: Medicaid Other | Attending: Physical Medicine and Rehabilitation | Admitting: Registered Nurse

## 2018-12-28 ENCOUNTER — Other Ambulatory Visit: Payer: Self-pay

## 2018-12-28 VITALS — HR 93 | Temp 98.6°F | Ht 65.5 in | Wt 300.0 lb

## 2018-12-28 DIAGNOSIS — M25551 Pain in right hip: Secondary | ICD-10-CM | POA: Insufficient documentation

## 2018-12-28 DIAGNOSIS — M79604 Pain in right leg: Secondary | ICD-10-CM | POA: Insufficient documentation

## 2018-12-28 DIAGNOSIS — Z5181 Encounter for therapeutic drug level monitoring: Secondary | ICD-10-CM

## 2018-12-28 DIAGNOSIS — E119 Type 2 diabetes mellitus without complications: Secondary | ICD-10-CM | POA: Insufficient documentation

## 2018-12-28 DIAGNOSIS — M47816 Spondylosis without myelopathy or radiculopathy, lumbar region: Secondary | ICD-10-CM | POA: Insufficient documentation

## 2018-12-28 DIAGNOSIS — M25512 Pain in left shoulder: Secondary | ICD-10-CM | POA: Insufficient documentation

## 2018-12-28 DIAGNOSIS — M7542 Impingement syndrome of left shoulder: Secondary | ICD-10-CM | POA: Diagnosis not present

## 2018-12-28 DIAGNOSIS — M810 Age-related osteoporosis without current pathological fracture: Secondary | ICD-10-CM | POA: Insufficient documentation

## 2018-12-28 DIAGNOSIS — G894 Chronic pain syndrome: Secondary | ICD-10-CM

## 2018-12-28 DIAGNOSIS — E785 Hyperlipidemia, unspecified: Secondary | ICD-10-CM | POA: Insufficient documentation

## 2018-12-28 DIAGNOSIS — J449 Chronic obstructive pulmonary disease, unspecified: Secondary | ICD-10-CM | POA: Insufficient documentation

## 2018-12-28 DIAGNOSIS — Z79891 Long term (current) use of opiate analgesic: Secondary | ICD-10-CM

## 2018-12-28 DIAGNOSIS — M5416 Radiculopathy, lumbar region: Secondary | ICD-10-CM | POA: Diagnosis not present

## 2018-12-28 DIAGNOSIS — M7062 Trochanteric bursitis, left hip: Secondary | ICD-10-CM

## 2018-12-28 DIAGNOSIS — F17210 Nicotine dependence, cigarettes, uncomplicated: Secondary | ICD-10-CM | POA: Insufficient documentation

## 2018-12-28 DIAGNOSIS — G8929 Other chronic pain: Secondary | ICD-10-CM

## 2018-12-28 DIAGNOSIS — M75101 Unspecified rotator cuff tear or rupture of right shoulder, not specified as traumatic: Secondary | ICD-10-CM

## 2018-12-28 DIAGNOSIS — M546 Pain in thoracic spine: Secondary | ICD-10-CM | POA: Diagnosis not present

## 2018-12-28 DIAGNOSIS — M7061 Trochanteric bursitis, right hip: Secondary | ICD-10-CM

## 2018-12-28 DIAGNOSIS — M961 Postlaminectomy syndrome, not elsewhere classified: Secondary | ICD-10-CM

## 2018-12-28 DIAGNOSIS — M7918 Myalgia, other site: Secondary | ICD-10-CM

## 2018-12-28 DIAGNOSIS — M47814 Spondylosis without myelopathy or radiculopathy, thoracic region: Secondary | ICD-10-CM | POA: Insufficient documentation

## 2018-12-28 DIAGNOSIS — K219 Gastro-esophageal reflux disease without esophagitis: Secondary | ICD-10-CM | POA: Insufficient documentation

## 2018-12-28 DIAGNOSIS — IMO0002 Reserved for concepts with insufficient information to code with codable children: Secondary | ICD-10-CM

## 2018-12-28 DIAGNOSIS — E039 Hypothyroidism, unspecified: Secondary | ICD-10-CM | POA: Insufficient documentation

## 2018-12-28 MED ORDER — HYDROCODONE-ACETAMINOPHEN 10-325 MG PO TABS: ORAL_TABLET | ORAL | 0 refills | Status: DC
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
## 2018-12-30 ENCOUNTER — Other Ambulatory Visit: Payer: Self-pay | Admitting: Family Medicine

## 2018-12-30 ENCOUNTER — Other Ambulatory Visit: Payer: Self-pay | Admitting: Gastroenterology

## 2018-12-30 NOTE — Telephone Encounter (Signed)
Just refilled meds for most of these 2 weeks ago, will refill albuterol and need to call pharmacy and inquire about why they need early refills?  Also see if they or the pt wants 90 d supply?

## 2018-12-30 NOTE — Telephone Encounter (Signed)
Can you call pharmacy and see if they need meds refilled 2 weeks ahead of time for some reason?  (bubble pack? For mailing time?)

## 2018-12-30 NOTE — Telephone Encounter (Signed)
Requested Prescriptions   Pending Prescriptions Disp Refills  . albuterol (VENTOLIN HFA) 108 (90 Base) MCG/ACT inhaler [Pharmacy Med Name: ALBUTEROL HFA 90 MCG INHALER] 8.5 g 0    Sig: INHALE 2 PUFFS INTO THE LUNGS EVERY 4-6 HOURS AS NEEDED FOR SHORTNESSOF BREATH.  Marland Kitchen albuterol (PROVENTIL) (2.5 MG/3ML) 0.083% nebulizer solution [Pharmacy Med Name: ALBUTEROL 0.83/ML SOLUTION] 150 mL 0    Sig: INHALE 1 VIAL VIA NEBULIZER EVERY 4 HOURS AS NEEDED FOR WHEEZING/SHORTNESS OF BREATH.  Marland Kitchen atorvastatin (LIPITOR) 40 MG tablet [Pharmacy Med Name: ATORVASTATIN 40MG  TABLETS] 30 tablet 0    Sig: TAKE 1 TABLET BY MOUTH ONCE DAILY AT 6:00 PM.  . escitalopram (LEXAPRO) 20 MG tablet [Pharmacy Med Name: ESCITALOPRAM 20 MG TABLETS] 30 tablet 0    Sig: TAKE 1 TABLET EVERY DAY.  . cetirizine (ZYRTEC) 10 MG tablet [Pharmacy Med Name: CETIRIZINE HCL 10MG  TABLET] 30 tablet 0    Sig: TAKE ONE TABLET BY MOUTH ONCE DAILY.  . niacin (NIASPAN) 1000 MG CR tablet [Pharmacy Med Name: NIASPAN 1000MG  TABLET SA] 60 tablet 0    Sig: TAKE 2 TABLETS BY MOUTH AT BEDTIME  . VESICARE 5 MG tablet [Pharmacy Med Name: VESICARE 5 MG TABLET] 30 tablet 0    Sig: TAKE ONE TABLET BY MOUTH ONCE DAILY.  Marland Kitchen JANUVIA 100 MG tablet [Pharmacy Med Name: JANUVIA 100 MG TABLET] 30 tablet 0    Sig: TAKE ONE TABLET BY MOUTH ONCE DAILY.  Marland Kitchen ADVAIR DISKUS 250-50 MCG/DOSE AEPB [Pharmacy Med Name: ADVAIR DISKUS 250/50] 60 each 0    Sig: INHALE 1 PUFF 2 TIMES A DAY RINSE AFTER USE.  . raloxifene (EVISTA) 60 MG tablet [Pharmacy Med Name: RALOXIFENE HCL 60 MG TABLET] 30 tablet 0    Sig: TAKE 1 TABLET BY MOUTH ONCE A DAY.  Marland Kitchen ezetimibe (ZETIA) 10 MG tablet [Pharmacy Med Name: EZETIMIBE 10 MG TABLET] 30 tablet 0    Sig: TAKE 1 TABLET BY MOUTH ONCE A DAY.  Marland Kitchen losartan (COZAAR) 50 MG tablet [Pharmacy Med Name: LOSARTAN POTASSIUM 50 MG TAB] 30 tablet 0    Sig: TAKE 1 TABLET BY MOUTH ONCE A DAY.  . metFORMIN (GLUCOPHAGE) 1000 MG tablet [Pharmacy Med Name: METFORMIN HCL  1000 MG TABLET] 60 tablet 0    Sig: TAKE ONE TABLET BY MOUTH TWICE A DAY  . acyclovir (ZOVIRAX) 400 MG tablet [Pharmacy Med Name: ACYCLOVIR 400 MG TABLET] 30 tablet 0    Sig: TAKE ONE TABLET BY MOUTH ONCE DAILY.   Last OV 10/27/2018

## 2019-01-10 ENCOUNTER — Other Ambulatory Visit: Payer: Self-pay | Admitting: Family Medicine

## 2019-01-10 ENCOUNTER — Other Ambulatory Visit: Payer: Self-pay | Admitting: Gastroenterology

## 2019-01-11 ENCOUNTER — Ambulatory Visit: Payer: Medicaid Other | Admitting: Gastroenterology

## 2019-01-15 ENCOUNTER — Other Ambulatory Visit: Payer: Self-pay | Admitting: Gastroenterology

## 2019-01-18 ENCOUNTER — Other Ambulatory Visit: Payer: Self-pay | Admitting: Registered Nurse

## 2019-01-18 ENCOUNTER — Other Ambulatory Visit: Payer: Self-pay | Admitting: Family Medicine

## 2019-01-21 ENCOUNTER — Encounter: Payer: Medicaid Other | Admitting: Physical Medicine & Rehabilitation

## 2019-01-21 ENCOUNTER — Other Ambulatory Visit: Payer: Self-pay

## 2019-01-21 ENCOUNTER — Encounter: Payer: Medicaid Other | Attending: Physical Medicine and Rehabilitation | Admitting: Physical Medicine & Rehabilitation

## 2019-01-21 ENCOUNTER — Encounter: Payer: Self-pay | Admitting: Physical Medicine & Rehabilitation

## 2019-01-21 VITALS — HR 94 | Ht 65.5 in | Wt 300.0 lb

## 2019-01-21 DIAGNOSIS — M47816 Spondylosis without myelopathy or radiculopathy, lumbar region: Secondary | ICD-10-CM | POA: Insufficient documentation

## 2019-01-21 DIAGNOSIS — M47814 Spondylosis without myelopathy or radiculopathy, thoracic region: Secondary | ICD-10-CM | POA: Insufficient documentation

## 2019-01-21 DIAGNOSIS — M75101 Unspecified rotator cuff tear or rupture of right shoulder, not specified as traumatic: Secondary | ICD-10-CM

## 2019-01-21 DIAGNOSIS — F17210 Nicotine dependence, cigarettes, uncomplicated: Secondary | ICD-10-CM | POA: Insufficient documentation

## 2019-01-21 DIAGNOSIS — M7542 Impingement syndrome of left shoulder: Secondary | ICD-10-CM

## 2019-01-21 DIAGNOSIS — M961 Postlaminectomy syndrome, not elsewhere classified: Secondary | ICD-10-CM | POA: Diagnosis not present

## 2019-01-21 DIAGNOSIS — G8929 Other chronic pain: Secondary | ICD-10-CM | POA: Insufficient documentation

## 2019-01-21 DIAGNOSIS — M7061 Trochanteric bursitis, right hip: Secondary | ICD-10-CM

## 2019-01-21 DIAGNOSIS — M79604 Pain in right leg: Secondary | ICD-10-CM | POA: Insufficient documentation

## 2019-01-21 DIAGNOSIS — M25512 Pain in left shoulder: Secondary | ICD-10-CM | POA: Insufficient documentation

## 2019-01-21 DIAGNOSIS — IMO0002 Reserved for concepts with insufficient information to code with codable children: Secondary | ICD-10-CM

## 2019-01-21 DIAGNOSIS — M810 Age-related osteoporosis without current pathological fracture: Secondary | ICD-10-CM | POA: Insufficient documentation

## 2019-01-21 DIAGNOSIS — E785 Hyperlipidemia, unspecified: Secondary | ICD-10-CM | POA: Insufficient documentation

## 2019-01-21 DIAGNOSIS — E039 Hypothyroidism, unspecified: Secondary | ICD-10-CM | POA: Insufficient documentation

## 2019-01-21 DIAGNOSIS — E119 Type 2 diabetes mellitus without complications: Secondary | ICD-10-CM | POA: Insufficient documentation

## 2019-01-21 DIAGNOSIS — M7062 Trochanteric bursitis, left hip: Secondary | ICD-10-CM

## 2019-01-21 DIAGNOSIS — K219 Gastro-esophageal reflux disease without esophagitis: Secondary | ICD-10-CM | POA: Insufficient documentation

## 2019-01-21 DIAGNOSIS — J449 Chronic obstructive pulmonary disease, unspecified: Secondary | ICD-10-CM | POA: Insufficient documentation

## 2019-01-21 DIAGNOSIS — M25551 Pain in right hip: Secondary | ICD-10-CM | POA: Insufficient documentation

## 2019-01-21 MED ORDER — HYDROCODONE-ACETAMINOPHEN 10-325 MG PO TABS: ORAL_TABLET | ORAL | 0 refills | Status: DC
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
## 2019-02-03 ENCOUNTER — Other Ambulatory Visit: Payer: Self-pay | Admitting: Family Medicine

## 2019-02-04 ENCOUNTER — Other Ambulatory Visit: Payer: Self-pay | Admitting: Physical Medicine & Rehabilitation

## 2019-02-17 ENCOUNTER — Encounter: Payer: Medicaid Other | Admitting: Physical Medicine & Rehabilitation

## 2019-02-25 ENCOUNTER — Other Ambulatory Visit: Payer: Self-pay

## 2019-02-25 ENCOUNTER — Encounter: Payer: Medicaid Other | Attending: Physical Medicine and Rehabilitation | Admitting: Physical Medicine & Rehabilitation

## 2019-02-25 VITALS — BP 107/73 | HR 102 | Temp 98.3°F

## 2019-02-25 DIAGNOSIS — G8929 Other chronic pain: Secondary | ICD-10-CM | POA: Insufficient documentation

## 2019-02-25 DIAGNOSIS — K219 Gastro-esophageal reflux disease without esophagitis: Secondary | ICD-10-CM | POA: Insufficient documentation

## 2019-02-25 DIAGNOSIS — E785 Hyperlipidemia, unspecified: Secondary | ICD-10-CM | POA: Insufficient documentation

## 2019-02-25 DIAGNOSIS — M47814 Spondylosis without myelopathy or radiculopathy, thoracic region: Secondary | ICD-10-CM | POA: Diagnosis not present

## 2019-02-25 DIAGNOSIS — G894 Chronic pain syndrome: Secondary | ICD-10-CM

## 2019-02-25 DIAGNOSIS — F17210 Nicotine dependence, cigarettes, uncomplicated: Secondary | ICD-10-CM | POA: Insufficient documentation

## 2019-02-25 DIAGNOSIS — M47816 Spondylosis without myelopathy or radiculopathy, lumbar region: Secondary | ICD-10-CM | POA: Insufficient documentation

## 2019-02-25 DIAGNOSIS — M25551 Pain in right hip: Secondary | ICD-10-CM | POA: Insufficient documentation

## 2019-02-25 DIAGNOSIS — M25512 Pain in left shoulder: Secondary | ICD-10-CM | POA: Insufficient documentation

## 2019-02-25 DIAGNOSIS — E119 Type 2 diabetes mellitus without complications: Secondary | ICD-10-CM | POA: Diagnosis not present

## 2019-02-25 DIAGNOSIS — M79604 Pain in right leg: Secondary | ICD-10-CM | POA: Diagnosis not present

## 2019-02-25 DIAGNOSIS — M810 Age-related osteoporosis without current pathological fracture: Secondary | ICD-10-CM | POA: Insufficient documentation

## 2019-02-25 DIAGNOSIS — J449 Chronic obstructive pulmonary disease, unspecified: Secondary | ICD-10-CM | POA: Insufficient documentation

## 2019-02-25 DIAGNOSIS — Z79891 Long term (current) use of opiate analgesic: Secondary | ICD-10-CM

## 2019-02-25 DIAGNOSIS — E039 Hypothyroidism, unspecified: Secondary | ICD-10-CM | POA: Diagnosis not present

## 2019-02-25 DIAGNOSIS — Z5181 Encounter for therapeutic drug level monitoring: Secondary | ICD-10-CM

## 2019-02-25 MED ORDER — HYDROCODONE-ACETAMINOPHEN 10-325 MG PO TABS: ORAL_TABLET | ORAL | 0 refills | Status: DC

## 2019-02-25 NOTE — Progress Notes (Signed)
Trochanteric bursa injection Left  With  ultrasound guidance  Indication Trochanteric bursitis. Exam has tenderness over the greater trochanter of the hip. Pain has not responded to conservative care such as exercise therapy and oral medications. Pain interferes with sleep or with mobility Informed consent was obtained after describing risks and benefits of the procedure with the patient these include bleeding bruising and infection. Patient has signed written consent form. Patient placed in a lateral decubitus position with the affected hip superior.25g 1.5 inch needle was used to infiltrate 35ml of 1% lidocaine, followed by insertion of a 22g 8cm [name] bloc needle Short axis view of greater trochanter , inplane approach , needle tip approximated post lateral aspect of troch just off periosteum.Needle slightly withdrawn then 6mg  of betamethasone with 4 cc 1% lidocaine were injected. Patient tolerated procedure well. Post procedure instructions given.

## 2019-02-25 NOTE — Patient Instructions (Signed)
THe tingling in the left han is not casued by your shoulder pain.  I suspect carpal tunnel and will do a test to evaluate

## 2019-03-03 LAB — DRUG TOX MONITOR 1 W/CONF, ORAL FLD
Amphetamines: NEGATIVE ng/mL (ref <10)
Barbiturates: NEGATIVE ng/mL (ref <10)
Benzodiazepines: NEGATIVE ng/mL (ref <0.50)
Buprenorphine: NEGATIVE ng/mL (ref <0.10)
Cocaine: NEGATIVE ng/mL (ref <5.0)
Codeine: NEGATIVE ng/mL (ref <2.5)
Dihydrocodeine: NEGATIVE ng/mL (ref <2.5)
Fentanyl: NEGATIVE ng/mL (ref <0.10)
Heroin Metabolite: NEGATIVE ng/mL (ref <1.0)
Hydrocodone: 7.1 ng/mL — ABNORMAL HIGH (ref <2.5)
Hydromorphone: NEGATIVE ng/mL (ref <2.5)
MARIJUANA: NEGATIVE ng/mL (ref <2.5)
MDMA: NEGATIVE ng/mL (ref <10)
Meprobamate: NEGATIVE ng/mL (ref <2.5)
Methadone: NEGATIVE ng/mL (ref <5.0)
Morphine: NEGATIVE ng/mL (ref <2.5)
Nicotine Metabolite: NEGATIVE ng/mL (ref <5.0)
Norhydrocodone: NEGATIVE ng/mL (ref <2.5)
Noroxycodone: NEGATIVE ng/mL (ref <2.5)
Opiates: POSITIVE ng/mL — AB (ref <2.5)
Oxycodone: NEGATIVE ng/mL (ref <2.5)
Oxymorphone: NEGATIVE ng/mL (ref <2.5)
Phencyclidine: NEGATIVE ng/mL (ref <10)
Tapentadol: NEGATIVE ng/mL (ref <5.0)
Tramadol: NEGATIVE ng/mL (ref <5.0)
Zolpidem: NEGATIVE ng/mL (ref <5.0)

## 2019-03-03 LAB — DRUG TOX ALC METAB W/CON, ORAL FLD: Alcohol Metabolite: NEGATIVE ng/mL (ref <25)

## 2019-03-07 ENCOUNTER — Telehealth: Payer: Self-pay | Admitting: *Deleted

## 2019-03-07 NOTE — Telephone Encounter (Signed)
Oral swab drug screen was consistent for prescribed medications.  ?

## 2019-03-21 ENCOUNTER — Other Ambulatory Visit: Payer: Self-pay | Admitting: Family Medicine

## 2019-03-21 ENCOUNTER — Other Ambulatory Visit: Payer: Self-pay | Admitting: Registered Nurse

## 2019-03-21 DIAGNOSIS — G8929 Other chronic pain: Secondary | ICD-10-CM

## 2019-03-21 DIAGNOSIS — M7062 Trochanteric bursitis, left hip: Secondary | ICD-10-CM

## 2019-03-21 DIAGNOSIS — M25512 Pain in left shoulder: Secondary | ICD-10-CM

## 2019-03-25 ENCOUNTER — Other Ambulatory Visit: Payer: Self-pay

## 2019-03-25 ENCOUNTER — Encounter: Payer: Medicaid Other | Attending: Physical Medicine and Rehabilitation | Admitting: Physical Medicine & Rehabilitation

## 2019-03-25 VITALS — BP 112/76 | HR 112 | Temp 97.8°F | Ht 65.5 in | Wt 300.0 lb

## 2019-03-25 DIAGNOSIS — R202 Paresthesia of skin: Secondary | ICD-10-CM | POA: Diagnosis not present

## 2019-03-25 DIAGNOSIS — K219 Gastro-esophageal reflux disease without esophagitis: Secondary | ICD-10-CM | POA: Diagnosis not present

## 2019-03-25 DIAGNOSIS — E785 Hyperlipidemia, unspecified: Secondary | ICD-10-CM | POA: Diagnosis not present

## 2019-03-25 DIAGNOSIS — F17210 Nicotine dependence, cigarettes, uncomplicated: Secondary | ICD-10-CM | POA: Diagnosis not present

## 2019-03-25 DIAGNOSIS — M25551 Pain in right hip: Secondary | ICD-10-CM | POA: Insufficient documentation

## 2019-03-25 DIAGNOSIS — E119 Type 2 diabetes mellitus without complications: Secondary | ICD-10-CM | POA: Insufficient documentation

## 2019-03-25 DIAGNOSIS — M810 Age-related osteoporosis without current pathological fracture: Secondary | ICD-10-CM | POA: Insufficient documentation

## 2019-03-25 DIAGNOSIS — E039 Hypothyroidism, unspecified: Secondary | ICD-10-CM | POA: Diagnosis not present

## 2019-03-25 DIAGNOSIS — M25512 Pain in left shoulder: Secondary | ICD-10-CM | POA: Diagnosis not present

## 2019-03-25 DIAGNOSIS — M47814 Spondylosis without myelopathy or radiculopathy, thoracic region: Secondary | ICD-10-CM | POA: Insufficient documentation

## 2019-03-25 DIAGNOSIS — M47816 Spondylosis without myelopathy or radiculopathy, lumbar region: Secondary | ICD-10-CM | POA: Insufficient documentation

## 2019-03-25 DIAGNOSIS — J449 Chronic obstructive pulmonary disease, unspecified: Secondary | ICD-10-CM | POA: Diagnosis not present

## 2019-03-25 DIAGNOSIS — M79604 Pain in right leg: Secondary | ICD-10-CM | POA: Insufficient documentation

## 2019-03-25 DIAGNOSIS — G8929 Other chronic pain: Secondary | ICD-10-CM | POA: Insufficient documentation

## 2019-03-25 MED ORDER — HYDROCODONE-ACETAMINOPHEN 10-325 MG PO TABS: ORAL_TABLET | ORAL | 0 refills | Status: DC

## 2019-03-25 NOTE — Progress Notes (Signed)
EMG/NCV left upper extremity performed today to evaluate tingling in the left hand.  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  Left median nerve has normal sensory component as well as distal motor however proximal motor cannot be elicited.  This may be due to her obesity and edema in the left upper extremity but  cannot rule out a concomitant ulnar neuropathy at the elbow.

## 2019-03-25 NOTE — Patient Instructions (Signed)
Please wear a wrist brace on your left hand at night.  This can be purchased at a pharmacy.

## 2019-04-01 ENCOUNTER — Encounter: Payer: Self-pay | Admitting: Family Medicine

## 2019-04-01 ENCOUNTER — Telehealth: Payer: Self-pay

## 2019-04-01 DIAGNOSIS — M25512 Pain in left shoulder: Secondary | ICD-10-CM

## 2019-04-01 DIAGNOSIS — G8929 Other chronic pain: Secondary | ICD-10-CM

## 2019-04-01 DIAGNOSIS — R32 Unspecified urinary incontinence: Secondary | ICD-10-CM | POA: Insufficient documentation

## 2019-04-01 NOTE — Telephone Encounter (Signed)
Put pt on my schedule for next week- will need fasting labs I can then order these things if medically necessary

## 2019-04-01 NOTE — Telephone Encounter (Signed)
Orders for incontinence disposable pads faxed to [facility] at [phone].

## 2019-04-01 NOTE — Telephone Encounter (Signed)
Spoke with pt and case worker Janine Ores regarding an Rx for a new hospital bed and an Rx for Prevail incontinence pads ultra plus (3 bags) sent to [facility]. Please advise.

## 2019-04-01 NOTE — Telephone Encounter (Signed)
Script written

## 2019-04-01 NOTE — Telephone Encounter (Signed)
Patient called and left message, was asking about having some xrays done.  Stated that her and Dr. Letta Pate were discussing them during her last procedure visit.

## 2019-04-01 NOTE — Telephone Encounter (Signed)
Pt notified. Verbalizes understanding. Appt has been made. Pt would like to know if you could give her 1 bag of the Prevail pads?

## 2019-04-05 ENCOUNTER — Other Ambulatory Visit: Payer: Self-pay

## 2019-04-06 ENCOUNTER — Ambulatory Visit: Payer: Medicaid Other | Admitting: Family Medicine

## 2019-04-06 ENCOUNTER — Encounter: Payer: Self-pay | Admitting: Family Medicine

## 2019-04-06 VITALS — BP 134/72 | HR 82 | Temp 98.4°F | Resp 16 | Ht 65.5 in | Wt 300.0 lb

## 2019-04-06 DIAGNOSIS — I700 Atherosclerosis of aorta: Secondary | ICD-10-CM | POA: Diagnosis not present

## 2019-04-06 DIAGNOSIS — J449 Chronic obstructive pulmonary disease, unspecified: Secondary | ICD-10-CM | POA: Diagnosis not present

## 2019-04-06 DIAGNOSIS — E032 Hypothyroidism due to medicaments and other exogenous substances: Secondary | ICD-10-CM

## 2019-04-06 DIAGNOSIS — R269 Unspecified abnormalities of gait and mobility: Secondary | ICD-10-CM

## 2019-04-06 DIAGNOSIS — E1165 Type 2 diabetes mellitus with hyperglycemia: Secondary | ICD-10-CM | POA: Diagnosis not present

## 2019-04-06 DIAGNOSIS — Z794 Long term (current) use of insulin: Secondary | ICD-10-CM

## 2019-04-06 DIAGNOSIS — K582 Mixed irritable bowel syndrome: Secondary | ICD-10-CM | POA: Diagnosis not present

## 2019-04-06 DIAGNOSIS — N3946 Mixed incontinence: Secondary | ICD-10-CM

## 2019-04-06 DIAGNOSIS — M961 Postlaminectomy syndrome, not elsewhere classified: Secondary | ICD-10-CM

## 2019-04-06 DIAGNOSIS — IMO0002 Reserved for concepts with insufficient information to code with codable children: Secondary | ICD-10-CM

## 2019-04-06 DIAGNOSIS — D509 Iron deficiency anemia, unspecified: Secondary | ICD-10-CM

## 2019-04-06 NOTE — Assessment & Plan Note (Signed)
Chronic back pain with right radiculopathy chronic weakness gait instability.  She is wheelchair-bound. Is unable to use her cane or walker she cannot bear weight and is nonambulatory.  She is unable to use manual wheelchair in the setting of her chronic rotator cuff syndrome decreased strength in the left upper extremity she is unable to use the tiller system with motorized scooter also in the setting of her morbid obesity.  She is motivated and willing to use a power operated wheelchair with joystick.  She is mentally and physically capable of using a power mobility device.   Having a power mobility device will keep her as independent as possible within her home to allow her to get to her bathroom for toileting as well as into the kitchen for mealtimes and into her bedroom.

## 2019-04-06 NOTE — Assessment & Plan Note (Signed)
Patient continues to benefit from use of urinary incontinence pads

## 2019-04-06 NOTE — Assessment & Plan Note (Addendum)
I will obtain records from her specialist.  She will continue her current inhalers.  She has oxygen as needed and at bedtime  Taking care to her COPD she requires a hospital bed along with her lack of mobility.  This will allow her to elevate the head of the bed to reduce risk of aspiration and pneumonia.  Also allow for elevation of the feet in the setting of her peripheral edema and poor circulation.  Pillows and wedges have been tried and are not adequate for her positioning.

## 2019-04-06 NOTE — Assessment & Plan Note (Signed)
Recheck A1c she is only on metformin and sliding scale insulin which she only uses 3 times a week.  She would benefit from SGLT therapy if her renal function tolerates it

## 2019-04-06 NOTE — Patient Instructions (Addendum)
Referral to Dr. Channing Mutters for knee pain F/U 4 months Wellness exam

## 2019-04-06 NOTE — Progress Notes (Signed)
Subjective:    Patient ID: Lisa Ewing, female    DOB: [birth year], 63 y.o.   MRN: [id number]  Patient presents for Face to Face (hoverround, hospital bed, incontinence pads)  Patient here to follow-up chronic medical problems.  She also needs documentation for durable medical equipment.  She needs a new hospital bed as well as a new Hoveround motorized wheelchair and she needs incontinence supplies.  As is my first time going through all of her history in detail and provided history and the notes.  Of note she was see my PA who is leaving the practice and she will now become my primary patient.     Has had hospital bed for 12 years, and now broken the bed will not elevate or descend also unable to elevate the head of the bed due to the motor breaking.  has to elevate top of bed for breathing, uses railes to [facility] herself in bed  Uses bottom of bed to elevate feet for  Due to chronic peripheral swelling   Bed able fit within her home   She is able to use controls on bed     Incontinence pads needed- gets 3 packs a month from [facility]     She has urinary incntience and sometimes has bowel incontience due to IBS , wears a pad during the day - changes 4 times a day most days - takes vesicare for bladder  Followed by [facility]    Due for Upgrade on hoover round motorized wheelchair with joystick.  She has had her current chair for > 5 years  Unable to walk with walker or cane,  She can transfer from bed to chair with a pivot and help of her aide Is unable to bear complete weight on her legs.  She has been chair bound for 12 years since her Right hip surgery with nerve damage. This was done by Dr. Alvan Dame  She has chronic knee pain, she has chronic bursitis in hips, she does get steroid injections in to her hip   She has carpal tunnel syndrome in left hand able to manipulate her joystick with the right hand.  She also has chronic rotator cuff syndrome/impingement but not a  good surgical candidate.  She follows with Dr. Letta Pate for this.   Chronic insomnia- taking trazodone MDD- takes lexapro    HTN- taking losartan- does not check a thome   Osteoporosis- taking Evista - Bone DENSITY TOBE DONE   CKD- follows with   - taking lasix for edema, also on calcitriol for hyperparthyodism  -    She craves ice- instead of eating, often eats only 1 meal day      Hypothyroidism-  TSH was elevated at 11, it was increasead to 195mcg in apirl, she has been taking her thyroid medication alone  DM- taking Metformin 1000MG  bid , last A1C was  8.3% in Feb   She also uses Novolog sliding scale 3 times a week,    She only eats 1 meal after lunch, she often eats very small snacks   GERD- taking carafate  COPD- taking advair , for allergies taking zyrtec/singulair   Has oxygen at bedtime as needed 2 L , does not typically use during the day   Hyperlipidemia- taking lipitor/zetia    Review Of Systems:  GEN- denies fatigue, fever, weight loss,weakness, recent illness HEENT- denies eye drainage, change in vision, nasal discharge, CVS- denies chest pain, palpitations RESP- denies SOB, cough, wheeze ABD-  denies N/V, change in stools, abd pain GU- denies dysuria, hematuria, dribbling,+ incontinence MSK- + joint pain, muscle aches, injury Neuro- denies headache, dizziness, syncope, seizure activity       Objective:    BP 134/72   Pulse 82   Temp 98.4 F (36.9 C) (Oral)   Resp 16   Ht 5' 5.5" (1.664 m)   Wt 300 lb (136.1 kg) Comment: wheelchair bound  LMP  (LMP Unknown)   SpO2 97%   BMI 49.16 kg/m  GEN- NAD, alert and oriented x3,sitting in wheelchair  HEENT- PERRL, EOMI, non injected sclera, pink conjunctiva, MMM, oropharynx clear Neck- Supple, no thyromegaly CVS- RRR, no murmur RESP-CTAB ABD-NABS,soft,NT,ND EXT- chronic pedal edema non pitting  MSK- Decreased ROM bilat UE ext, +empty can left shoulder, has a drop in left shoulder 4/5 LUE, pain with ROM  LUE, compared to Right,  , 4/5 RLE flexion/extention, left FROM LE 5/5 Neuro- decreased muscle tone lower ext Pulses- Radial, DP- 2+  Mobility exam-patient unable to stand and bear weight she is in a wheelchair and examined unable to evaluate her gait as she is unable to walk/NON Ambulatory      Assessment & Plan:      Problem List Items Addressed This Visit      Unprioritized   Aortic atherosclerosis ([place]) - Primary   Relevant Orders   CBC with Differential/Platelet   Comprehensive metabolic panel   Lipid panel   COPD (chronic obstructive pulmonary disease) ([place])    I will obtain records from her specialist.  She will continue her current inhalers.  She has oxygen as needed and at bedtime  Taking care to her COPD she requires a hospital bed along with her lack of mobility.  This will allow her to elevate the head of the bed to reduce risk of aspiration and pneumonia.  Also allow for elevation of the feet in the setting of her peripheral edema and poor circulation.  Pillows and wedges have been tried and are not adequate for her positioning.      Gait disorder   Hypothyroidism, iatrogenic   Relevant Orders   T3, free   TSH   T4, free   IBS (irritable bowel syndrome)   Iron deficiency anemia   Relevant Orders   Iron, TIBC and Ferritin Panel   Obesity, Class III, BMI 40-49.9 (morbid obesity) (HCC)   Postlaminectomy syndrome, lumbar region    Chronic back pain with right radiculopathy chronic weakness gait instability.  She is wheelchair-bound. Is unable to use her cane or walker she cannot bear weight and is nonambulatory.  She is unable to use manual wheelchair in the setting of her chronic rotator cuff syndrome decreased strength in the left upper extremity she is unable to use the tiller system with motorized scooter also in the setting of her morbid obesity.  She is motivated and willing to use a power operated wheelchair with joystick.  She is mentally and physically  capable of using a power mobility device.   Having a power mobility device will keep her as independent as possible within her home to allow her to get to her bathroom for toileting as well as into the kitchen for mealtimes and into her bedroom.      Uncontrolled type 2 diabetes mellitus with insulin therapy ([place])    Recheck A1c she is only on metformin and sliding scale insulin which she only uses 3 times a week.  She would benefit from SGLT therapy if her  renal function tolerates it      Relevant Orders   Hemoglobin A1c   Lipid panel   Microalbumin / creatinine urine ratio   Urinary incontinence    Patient continues to benefit from use of urinary incontinence pads         Note: This dictation was prepared with Dragon dictation along with smaller phrase technology. Any transcriptional errors that result from this process are unintentional.

## 2019-04-08 LAB — MICROALBUMIN / CREATININE URINE RATIO
Creatinine, Urine: 88 mg/dL (ref 20–275)
Microalb Creat Ratio: 20 mcg/mg creat (ref <30)
Microalb, Ur: 1.8 mg/dL

## 2019-04-08 LAB — HEMOGLOBIN A1C
Hgb A1c MFr Bld: 8.1 % of total Hgb — ABNORMAL HIGH (ref <5.7)
Mean Plasma Glucose: 186 (calc)
eAG (mmol/L): 10.3 (calc)

## 2019-04-08 LAB — CBC WITH DIFFERENTIAL/PLATELET
Absolute Monocytes: 1243 cells/uL — ABNORMAL HIGH (ref 200–950)
Basophils Absolute: 140 cells/uL (ref 0–200)
Basophils Relative: 0.8 %
Eosinophils Absolute: 210 cells/uL (ref 15–500)
Eosinophils Relative: 1.2 %
HCT: 41.2 % (ref 35.0–45.0)
Hemoglobin: 12.1 g/dL (ref 11.7–15.5)
Lymphs Abs: 3798 cells/uL (ref 850–3900)
MCH: 21.9 pg — ABNORMAL LOW (ref 27.0–33.0)
MCHC: 29.4 g/dL — ABNORMAL LOW (ref 32.0–36.0)
MCV: 74.5 fL — ABNORMAL LOW (ref 80.0–100.0)
MPV: 10.2 fL (ref 7.5–12.5)
Monocytes Relative: 7.1 %
Neutro Abs: 12110 cells/uL — ABNORMAL HIGH (ref 1500–7800)
Neutrophils Relative %: 69.2 %
Platelets: 370 10*3/uL (ref 140–400)
RBC: 5.53 10*6/uL — ABNORMAL HIGH (ref 3.80–5.10)
RDW: 20.2 % — ABNORMAL HIGH (ref 11.0–15.0)
Total Lymphocyte: 21.7 %
WBC: 17.5 10*3/uL — ABNORMAL HIGH (ref 3.8–10.8)

## 2019-04-08 LAB — COMPREHENSIVE METABOLIC PANEL
AG Ratio: 1.4 (calc) (ref 1.0–2.5)
ALT: 14 U/L (ref 6–29)
AST: 19 U/L (ref 10–35)
Albumin: 3.9 g/dL (ref 3.6–5.1)
Alkaline phosphatase (APISO): 96 U/L (ref 37–153)
BUN: 18 mg/dL (ref 7–25)
CO2: 27 mmol/L (ref 20–32)
Calcium: 8.9 mg/dL (ref 8.6–10.4)
Chloride: 96 mmol/L — ABNORMAL LOW (ref 98–110)
Creat: 0.88 mg/dL (ref 0.50–0.99)
Globulin: 2.8 g/dL (calc) (ref 1.9–3.7)
Glucose, Bld: 145 mg/dL — ABNORMAL HIGH (ref 65–99)
Potassium: 4.8 mmol/L (ref 3.5–5.3)
Sodium: 136 mmol/L (ref 135–146)
Total Bilirubin: 0.3 mg/dL (ref 0.2–1.2)
Total Protein: 6.7 g/dL (ref 6.1–8.1)

## 2019-04-08 LAB — IRON,TIBC AND FERRITIN PANEL
%SAT: 7 % (calc) — ABNORMAL LOW (ref 16–45)
Ferritin: 9 ng/mL — ABNORMAL LOW (ref 16–288)
Iron: 34 ug/dL — ABNORMAL LOW (ref 45–160)
TIBC: 493 mcg/dL (calc) — ABNORMAL HIGH (ref 250–450)

## 2019-04-08 LAB — LIPID PANEL
Cholesterol: 96 mg/dL (ref <200)
HDL: 22 mg/dL — ABNORMAL LOW (ref >=50)
LDL Cholesterol (Calc): 40 mg/dL (calc)
Non-HDL Cholesterol (Calc): 74 mg/dL (calc) (ref <130)
Total CHOL/HDL Ratio: 4.4 (calc) (ref <5.0)
Triglycerides: 326 mg/dL — ABNORMAL HIGH (ref <150)

## 2019-04-08 LAB — TSH: TSH: 13.13 mIU/L — ABNORMAL HIGH (ref 0.40–4.50)

## 2019-04-08 LAB — T4, FREE: Free T4: 1.1 ng/dL (ref 0.8–1.8)

## 2019-04-08 LAB — T3, FREE: T3, Free: 1.9 pg/mL — ABNORMAL LOW (ref 2.3–4.2)

## 2019-04-11 ENCOUNTER — Other Ambulatory Visit: Payer: Self-pay | Admitting: *Deleted

## 2019-04-11 MED ORDER — LEVOTHYROXINE SODIUM 175 MCG PO TABS: 175.0000 ug | ORAL_TABLET | Freq: Every day | ORAL | 6 refills | Status: DC

## 2019-04-11 MED ORDER — FERROUS SULFATE 325 (65 FE) MG PO TABS: 325.0000 mg | ORAL_TABLET | Freq: Two times a day (BID) | ORAL | 6 refills | Status: DC

## 2019-04-11 MED ORDER — FARXIGA 5 MG PO TABS: 5.0000 mg | ORAL_TABLET | Freq: Every day | ORAL | 6 refills | Status: AC

## 2019-04-11 MED ORDER — SITAGLIPTIN PHOSPHATE 100 MG PO TABS: 100.0000 mg | ORAL_TABLET | Freq: Every day | ORAL | 6 refills | Status: DC

## 2019-04-12 ENCOUNTER — Telehealth: Payer: Self-pay | Admitting: *Deleted

## 2019-04-12 NOTE — Telephone Encounter (Signed)
Agree with below. Addendum:    I believe that she has limits with moving and including, toileting, bathing, feeding, dressing and grooming. I believe the power wheelchair is needed for pt to be able to perform ADL's in her home

## 2019-04-12 NOTE — Telephone Encounter (Signed)
Received call from Judson Roch Bucoda with [facility] [phone]~ telephone.   Requested orders to recertify patient for CAP program. Reports frequency of visit is 1 visit/ 2 months.   VO given.

## 2019-04-18 ENCOUNTER — Other Ambulatory Visit: Payer: Self-pay | Admitting: Family Medicine

## 2019-04-19 ENCOUNTER — Encounter: Payer: Self-pay | Admitting: Family Medicine

## 2019-04-19 DIAGNOSIS — J9610 Chronic respiratory failure, unspecified whether with hypoxia or hypercapnia: Secondary | ICD-10-CM | POA: Insufficient documentation

## 2019-04-20 ENCOUNTER — Telehealth: Payer: Self-pay | Admitting: *Deleted

## 2019-04-20 DIAGNOSIS — R2681 Unsteadiness on feet: Secondary | ICD-10-CM

## 2019-04-20 DIAGNOSIS — R269 Unspecified abnormalities of gait and mobility: Secondary | ICD-10-CM

## 2019-04-20 NOTE — Telephone Encounter (Signed)
Received call from patient.   Reports that she needs referral to PT to be evaluated for her wheelchair upgrade. Call placed to patient to inquire.   States that she received call from [facility] and was advised that she will require PT eval for new wheelchair.   MD please advise.

## 2019-04-20 NOTE — Telephone Encounter (Signed)
Call placed to patient and patient made aware.   Referral orders placed.  

## 2019-04-20 NOTE — Telephone Encounter (Signed)
Okay to place order for PT evaluation- Dx motorized wheelchair evaluation, gait instability

## 2019-04-25 ENCOUNTER — Telehealth: Payer: Self-pay | Admitting: *Deleted

## 2019-04-25 DIAGNOSIS — R2681 Unsteadiness on feet: Secondary | ICD-10-CM

## 2019-04-25 DIAGNOSIS — R269 Unspecified abnormalities of gait and mobility: Secondary | ICD-10-CM

## 2019-04-25 NOTE — Telephone Encounter (Signed)
-----   Message from Simeon Craft sent at 04/22/2019 11:22 AM EDT ----- The referral is asking for [place] to do a wheelchair evaluation.  Our OTs do all wheelchair evals in our office.  Is it possible to change this to an Occupational Therapy referral please?

## 2019-04-25 NOTE — Telephone Encounter (Signed)
Referral orders placed

## 2019-04-26 NOTE — Telephone Encounter (Signed)
noted 

## 2019-05-03 ENCOUNTER — Encounter: Payer: Medicaid Other | Attending: Physical Medicine and Rehabilitation | Admitting: Physical Medicine & Rehabilitation

## 2019-05-03 ENCOUNTER — Other Ambulatory Visit: Payer: Self-pay

## 2019-05-03 ENCOUNTER — Encounter: Payer: Self-pay | Admitting: Physical Medicine & Rehabilitation

## 2019-05-03 VITALS — BP 97/60 | HR 105 | Temp 98.6°F | Ht 65.0 in | Wt 300.0 lb

## 2019-05-03 DIAGNOSIS — F17210 Nicotine dependence, cigarettes, uncomplicated: Secondary | ICD-10-CM | POA: Insufficient documentation

## 2019-05-03 DIAGNOSIS — G5602 Carpal tunnel syndrome, left upper limb: Secondary | ICD-10-CM | POA: Diagnosis not present

## 2019-05-03 DIAGNOSIS — M810 Age-related osteoporosis without current pathological fracture: Secondary | ICD-10-CM | POA: Diagnosis not present

## 2019-05-03 DIAGNOSIS — E039 Hypothyroidism, unspecified: Secondary | ICD-10-CM | POA: Insufficient documentation

## 2019-05-03 DIAGNOSIS — M25551 Pain in right hip: Secondary | ICD-10-CM | POA: Diagnosis not present

## 2019-05-03 DIAGNOSIS — M47816 Spondylosis without myelopathy or radiculopathy, lumbar region: Secondary | ICD-10-CM | POA: Insufficient documentation

## 2019-05-03 DIAGNOSIS — K219 Gastro-esophageal reflux disease without esophagitis: Secondary | ICD-10-CM | POA: Insufficient documentation

## 2019-05-03 DIAGNOSIS — J449 Chronic obstructive pulmonary disease, unspecified: Secondary | ICD-10-CM | POA: Insufficient documentation

## 2019-05-03 DIAGNOSIS — M7989 Other specified soft tissue disorders: Secondary | ICD-10-CM | POA: Diagnosis not present

## 2019-05-03 DIAGNOSIS — M25512 Pain in left shoulder: Secondary | ICD-10-CM | POA: Diagnosis not present

## 2019-05-03 DIAGNOSIS — M47814 Spondylosis without myelopathy or radiculopathy, thoracic region: Secondary | ICD-10-CM | POA: Diagnosis not present

## 2019-05-03 DIAGNOSIS — M7542 Impingement syndrome of left shoulder: Secondary | ICD-10-CM

## 2019-05-03 DIAGNOSIS — E785 Hyperlipidemia, unspecified: Secondary | ICD-10-CM | POA: Insufficient documentation

## 2019-05-03 DIAGNOSIS — E119 Type 2 diabetes mellitus without complications: Secondary | ICD-10-CM | POA: Diagnosis not present

## 2019-05-03 DIAGNOSIS — G8929 Other chronic pain: Secondary | ICD-10-CM | POA: Diagnosis present

## 2019-05-03 DIAGNOSIS — M79604 Pain in right leg: Secondary | ICD-10-CM | POA: Diagnosis not present

## 2019-05-03 MED ORDER — HYDROCODONE-ACETAMINOPHEN 10-325 MG PO TABS: ORAL_TABLET | ORAL | 0 refills | Status: DC

## 2019-05-03 NOTE — Patient Instructions (Signed)
Vascular surgery referral is to evaluate LUE swelling   You may call Dr Aurea Graff office to have them evaluate your Left shoulder
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
## 2019-05-04 ENCOUNTER — Telehealth: Payer: Self-pay | Admitting: *Deleted

## 2019-05-04 DIAGNOSIS — M75102 Unspecified rotator cuff tear or rupture of left shoulder, not specified as traumatic: Secondary | ICD-10-CM

## 2019-05-04 DIAGNOSIS — M7542 Impingement syndrome of left shoulder: Secondary | ICD-10-CM

## 2019-05-04 DIAGNOSIS — G8929 Other chronic pain: Secondary | ICD-10-CM

## 2019-05-04 NOTE — Telephone Encounter (Signed)
Received call from patient.   Reports that she was seen by Dr. Letta Pate, pain management on 05/03/2019. States that she spoke to him about her shoulder pain and decreased ROM. Reports that he feels she would benefit from Ortho referral. States that she would like to be referred to [facility] to be seen for her L shoulder.   Left shoulder pain- history of adhesive capsulitis, however  most consistent with subacromial impingement plus minus rotator cuff tear. We discussed that she may benefit from orthopedic evaluation.  She has had hip surgery performed by Dr. Adriana Mccallum over at [facility].  She will contact their office.  MD please advise.

## 2019-05-04 NOTE — Telephone Encounter (Signed)
Okay to place orthopedic referral  Dx Chronic left shoulder pain, subacromial impingement, rotator cuff tear, put in notes [facility], also fax over [place] last note

## 2019-05-04 NOTE — Telephone Encounter (Signed)
Referral orders placed. Please see referral tab for referral tracking.

## 2019-05-05 ENCOUNTER — Telehealth: Payer: Self-pay

## 2019-05-05 DIAGNOSIS — M7542 Impingement syndrome of left shoulder: Secondary | ICD-10-CM

## 2019-05-05 NOTE — Telephone Encounter (Signed)
Patient left message on clinical line.  Stated was told to make appointment at ortho clinic for issues with shoulder.  Was told by ortho clinic that the doc she once saw does not treat shoulders but only hip/knees and that in order for her to see the shoulder specialist, she would need a referral.

## 2019-05-10 ENCOUNTER — Ambulatory Visit: Payer: Medicaid Other | Admitting: Urology

## 2019-05-11 ENCOUNTER — Ambulatory Visit (HOSPITAL_COMMUNITY): Payer: Medicaid Other | Admitting: Specialist

## 2019-05-17 ENCOUNTER — Other Ambulatory Visit: Payer: Self-pay | Admitting: Registered Nurse

## 2019-05-17 ENCOUNTER — Other Ambulatory Visit: Payer: Self-pay | Admitting: Family Medicine

## 2019-05-20 ENCOUNTER — Encounter (HOSPITAL_COMMUNITY): Payer: Self-pay

## 2019-05-20 ENCOUNTER — Other Ambulatory Visit: Payer: Self-pay

## 2019-05-20 ENCOUNTER — Ambulatory Visit (HOSPITAL_COMMUNITY): Payer: Medicaid Other | Attending: Family Medicine

## 2019-05-20 DIAGNOSIS — R29898 Other symptoms and signs involving the musculoskeletal system: Secondary | ICD-10-CM | POA: Diagnosis present
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
## 2019-05-23 ENCOUNTER — Other Ambulatory Visit: Payer: Self-pay

## 2019-05-23 ENCOUNTER — Ambulatory Visit (INDEPENDENT_AMBULATORY_CARE_PROVIDER_SITE_OTHER): Payer: Medicaid Other | Admitting: Family Medicine

## 2019-05-23 ENCOUNTER — Encounter: Payer: Self-pay | Admitting: Family Medicine

## 2019-05-23 VITALS — BP 128/82 | HR 100 | Temp 98.8°F | Resp 18

## 2019-05-23 DIAGNOSIS — Z794 Long term (current) use of insulin: Secondary | ICD-10-CM

## 2019-05-23 DIAGNOSIS — F17200 Nicotine dependence, unspecified, uncomplicated: Secondary | ICD-10-CM

## 2019-05-23 DIAGNOSIS — J449 Chronic obstructive pulmonary disease, unspecified: Secondary | ICD-10-CM

## 2019-05-23 DIAGNOSIS — D509 Iron deficiency anemia, unspecified: Secondary | ICD-10-CM | POA: Diagnosis not present

## 2019-05-23 DIAGNOSIS — Z01818 Encounter for other preprocedural examination: Secondary | ICD-10-CM

## 2019-05-23 DIAGNOSIS — I700 Atherosclerosis of aorta: Secondary | ICD-10-CM

## 2019-05-23 DIAGNOSIS — E1165 Type 2 diabetes mellitus with hyperglycemia: Secondary | ICD-10-CM | POA: Diagnosis not present

## 2019-05-23 DIAGNOSIS — Z23 Encounter for immunization: Secondary | ICD-10-CM | POA: Diagnosis not present

## 2019-05-23 DIAGNOSIS — IMO0002 Reserved for concepts with insufficient information to code with codable children: Secondary | ICD-10-CM

## 2019-05-23 DIAGNOSIS — E032 Hypothyroidism due to medicaments and other exogenous substances: Secondary | ICD-10-CM

## 2019-05-23 MED ORDER — NICOTINE POLACRILEX 4 MG MT GUM: 4.0000 mg | CHEWING_GUM | OROMUCOSAL | 0 refills | Status: DC | PRN

## 2019-05-23 MED ORDER — NICOTINE 21 MG/24HR TD PT24: 21.0000 mg | MEDICATED_PATCH | Freq: Every day | TRANSDERMAL | 0 refills | Status: DC

## 2019-05-23 NOTE — Addendum Note (Signed)
Addended by: Vic Blackbird F on: 05/23/2019 04:26 PM   Modules accepted: Orders

## 2019-05-23 NOTE — Assessment & Plan Note (Addendum)
Fasting blood sugars have improved.  She will follow-up in 6 weeks and we will do another A1c I would like to get her A1c at least 7.5% or below before signing her off for surgical intervention due to the increased risk of infection

## 2019-05-23 NOTE — Assessment & Plan Note (Signed)
Aortic atherosclerosis also in the setting of COPD chronic respiratory failure on 2 L of oxygen.  We will have her get cardiac clearance as I think she is high risk for any surgical intervention. Pressure is controlled.

## 2019-05-23 NOTE — Assessment & Plan Note (Signed)
COPD along with daily smoker.  She wants to try NicoDerm patches again which this is helped in the past.  We will start her at 21 mg and titrate her down.  I think that the more she smokes that she is clear for surgery this may impair her healing

## 2019-05-23 NOTE — Progress Notes (Addendum)
Subjective:    Patient ID: Lisa Ewing, female    DOB: 1956-02-29, 63 y.o.   MRN: [id number]  Patient presents for Follow-up, Surgical Clearance (shoulder replacement), and Nicotine Dependence (needs to quit 2 weeks prior to surgery)   Pt here for surgical clearance    Seen on Friday by orthopedics, [facility]   has adhesive capulitis /DJD  has appt with vascular to f/u out any clots in left UE before surgery     Wants to quit smoking - smokes 1ppd menthol lights, goal to quit smoking Oct 1st, she used patches in the past and quit smoking for 6 months   - Can not tolerate the wellbutrin    DM- last A1C  8.1% 6 weeks ago , CBG run 112-140's, not sure if she is on farxiga but this was added in August to her pill pack    Hypothyroism- now on 122mcg once a day due for recheck    Iron def anemia- taking iron tablet once a day      Reviewed PT note for [place] recommendations       Review Of Systems:  GEN- denies fatigue, fever, weight loss,weakness, recent illness HEENT- denies eye drainage, change in vision, nasal discharge, CVS- denies chest pain, palpitations RESP- denies SOB, cough, wheeze ABD- denies N/V, change in stools, abd pain GU- denies dysuria, hematuria, dribbling, incontinence MSK- denies joint pain, muscle aches, injury Neuro- denies headache, dizziness, syncope, seizure activity       Objective:    BP 128/82   Pulse 100   Temp 98.8 F (37.1 C) (Oral)   Resp 18   LMP  (LMP Unknown)   SpO2 92%  GEN- NAD, alert and oriented x3,  HEENT- PERRL, EOMI, non injected sclera, pink conjunctiva, MMM, oropharynx clear Neck- Supple, no thyromegaly CVS- RRR, no murmur RESP-CTAB ABD-NABS,soft,NT,ND EXT- No edema, decreased sensation bilat  Pulses- Radial, DP- 2+        Assessment & Plan:      Problem List Items Addressed This Visit      Unprioritized   Aortic  atherosclerosis ([place]) - Primary    Aortic atherosclerosis also in the setting of COPD chronic respiratory failure on 2 L of oxygen.  We will have her get cardiac clearance as I think she is high risk for any surgical intervention. Pressure is controlled.      Relevant Orders   Comprehensive metabolic panel   Ambulatory referral to Cardiology   COPD (chronic obstructive pulmonary disease) ([place])    COPD along with daily smoker.  She wants to try NicoDerm patches again which this is helped in the past.  We will start her at 21 mg and titrate her down.  I think that the more she smokes that she is clear for surgery this may impair her healing      Relevant Medications   nicotine (NICODERM CQ - DOSED IN MG/24 HOURS) 21 mg/24hr patch   nicotine polacrilex (NICORETTE) 4 MG gum   Hypothyroidism, iatrogenic   Relevant Orders   TSH   Iron deficiency anemia   Relevant Orders   CBC with Differential/Platelet   Iron   Smoker   Uncontrolled type 2 diabetes mellitus with insulin therapy ([place])    Fasting blood sugars have improved.  She will follow-up in 6 weeks and we will do another A1c I would like to get her A1c at least 7.5% or below before signing her  off for surgical intervention due to the increased risk of infection      Relevant Orders   Ambulatory referral to Ophthalmology   HM DIABETES FOOT EXAM (Completed)    Other Visit Diagnoses    Need for immunization against influenza       Relevant Orders   Flu Vaccine QUAD 36+ mos IM (Completed)   Pre-operative clearance       Relevant Orders   Ambulatory referral to Cardiology      Note: This dictation was prepared with Dragon dictation along with smaller phrase technology. Any transcriptional errors that result from this process are unintentional.

## 2019-05-23 NOTE — Patient Instructions (Addendum)
Referral to cardiology for clearance  We will call with lab results  Flu shot given  nicoderm patches and gum sent to pharmacy  F/U 6 weeks for surgery clearance

## 2019-05-24 LAB — CBC WITH DIFFERENTIAL/PLATELET
Absolute Monocytes: 950 cells/uL (ref 200–950)
Basophils Absolute: 106 cells/uL (ref 0–200)
Basophils Relative: 0.8 %
Eosinophils Absolute: 185 cells/uL (ref 15–500)
Eosinophils Relative: 1.4 %
HCT: 39.1 % (ref 35.0–45.0)
Hemoglobin: 12 g/dL (ref 11.7–15.5)
Lymphs Abs: 2983 cells/uL (ref 850–3900)
MCH: 22.6 pg — ABNORMAL LOW (ref 27.0–33.0)
MCHC: 30.7 g/dL — ABNORMAL LOW (ref 32.0–36.0)
MCV: 73.5 fL — ABNORMAL LOW (ref 80.0–100.0)
MPV: 10.7 fL (ref 7.5–12.5)
Monocytes Relative: 7.2 %
Neutro Abs: 8976 cells/uL — ABNORMAL HIGH (ref 1500–7800)
Neutrophils Relative %: 68 %
Platelets: 338 10*3/uL (ref 140–400)
RBC: 5.32 10*6/uL — ABNORMAL HIGH (ref 3.80–5.10)
RDW: 20.9 % — ABNORMAL HIGH (ref 11.0–15.0)
Total Lymphocyte: 22.6 %
WBC: 13.2 10*3/uL — ABNORMAL HIGH (ref 3.8–10.8)

## 2019-05-24 LAB — COMPREHENSIVE METABOLIC PANEL
AG Ratio: 1.3 (calc) (ref 1.0–2.5)
ALT: 17 U/L (ref 6–29)
AST: 23 U/L (ref 10–35)
Albumin: 3.7 g/dL (ref 3.6–5.1)
Alkaline phosphatase (APISO): 110 U/L (ref 37–153)
BUN: 15 mg/dL (ref 7–25)
CO2: 27 mmol/L (ref 20–32)
Calcium: 8.8 mg/dL (ref 8.6–10.4)
Chloride: 97 mmol/L — ABNORMAL LOW (ref 98–110)
Creat: 0.91 mg/dL (ref 0.50–0.99)
Globulin: 2.8 g/dL (calc) (ref 1.9–3.7)
Glucose, Bld: 211 mg/dL — ABNORMAL HIGH (ref 65–99)
Potassium: 4.8 mmol/L (ref 3.5–5.3)
Sodium: 136 mmol/L (ref 135–146)
Total Bilirubin: 0.3 mg/dL (ref 0.2–1.2)
Total Protein: 6.5 g/dL (ref 6.1–8.1)

## 2019-05-24 LAB — TSH: TSH: 12.26 mIU/L — ABNORMAL HIGH (ref 0.40–4.50)

## 2019-05-24 LAB — IRON: Iron: 46 ug/dL (ref 45–160)

## 2019-05-31 ENCOUNTER — Other Ambulatory Visit: Payer: Self-pay

## 2019-05-31 ENCOUNTER — Encounter: Payer: Self-pay | Admitting: Registered Nurse

## 2019-05-31 ENCOUNTER — Encounter: Payer: Medicaid Other | Attending: Physical Medicine and Rehabilitation | Admitting: Registered Nurse

## 2019-05-31 VITALS — BP 125/82 | HR 98 | Temp 97.7°F | Resp 14

## 2019-05-31 DIAGNOSIS — Z79899 Other long term (current) drug therapy: Secondary | ICD-10-CM

## 2019-05-31 DIAGNOSIS — Z5181 Encounter for therapeutic drug level monitoring: Secondary | ICD-10-CM

## 2019-05-31 DIAGNOSIS — K219 Gastro-esophageal reflux disease without esophagitis: Secondary | ICD-10-CM | POA: Insufficient documentation

## 2019-05-31 DIAGNOSIS — M47814 Spondylosis without myelopathy or radiculopathy, thoracic region: Secondary | ICD-10-CM | POA: Diagnosis not present

## 2019-05-31 DIAGNOSIS — F17210 Nicotine dependence, cigarettes, uncomplicated: Secondary | ICD-10-CM | POA: Diagnosis not present

## 2019-05-31 DIAGNOSIS — G894 Chronic pain syndrome: Secondary | ICD-10-CM

## 2019-05-31 DIAGNOSIS — E785 Hyperlipidemia, unspecified: Secondary | ICD-10-CM | POA: Insufficient documentation

## 2019-05-31 DIAGNOSIS — E119 Type 2 diabetes mellitus without complications: Secondary | ICD-10-CM | POA: Insufficient documentation

## 2019-05-31 DIAGNOSIS — M810 Age-related osteoporosis without current pathological fracture: Secondary | ICD-10-CM | POA: Insufficient documentation

## 2019-05-31 DIAGNOSIS — M7542 Impingement syndrome of left shoulder: Secondary | ICD-10-CM

## 2019-05-31 DIAGNOSIS — IMO0002 Reserved for concepts with insufficient information to code with codable children: Secondary | ICD-10-CM

## 2019-05-31 DIAGNOSIS — E039 Hypothyroidism, unspecified: Secondary | ICD-10-CM | POA: Insufficient documentation

## 2019-05-31 DIAGNOSIS — J449 Chronic obstructive pulmonary disease, unspecified: Secondary | ICD-10-CM | POA: Insufficient documentation

## 2019-05-31 DIAGNOSIS — M75101 Unspecified rotator cuff tear or rupture of right shoulder, not specified as traumatic: Secondary | ICD-10-CM

## 2019-05-31 DIAGNOSIS — M546 Pain in thoracic spine: Secondary | ICD-10-CM

## 2019-05-31 DIAGNOSIS — M961 Postlaminectomy syndrome, not elsewhere classified: Secondary | ICD-10-CM

## 2019-05-31 DIAGNOSIS — M5416 Radiculopathy, lumbar region: Secondary | ICD-10-CM | POA: Diagnosis not present

## 2019-05-31 DIAGNOSIS — G8929 Other chronic pain: Secondary | ICD-10-CM

## 2019-05-31 DIAGNOSIS — M25551 Pain in right hip: Secondary | ICD-10-CM | POA: Insufficient documentation

## 2019-05-31 DIAGNOSIS — M7062 Trochanteric bursitis, left hip: Secondary | ICD-10-CM

## 2019-05-31 DIAGNOSIS — M79604 Pain in right leg: Secondary | ICD-10-CM | POA: Diagnosis not present

## 2019-05-31 DIAGNOSIS — M47816 Spondylosis without myelopathy or radiculopathy, lumbar region: Secondary | ICD-10-CM | POA: Diagnosis not present

## 2019-05-31 DIAGNOSIS — M7061 Trochanteric bursitis, right hip: Secondary | ICD-10-CM

## 2019-05-31 DIAGNOSIS — M25512 Pain in left shoulder: Secondary | ICD-10-CM | POA: Diagnosis not present

## 2019-05-31 DIAGNOSIS — M7918 Myalgia, other site: Secondary | ICD-10-CM

## 2019-05-31 MED ORDER — HYDROCODONE-ACETAMINOPHEN 10-325 MG PO TABS: ORAL_TABLET | ORAL | 0 refills | Status: DC
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
## 2019-06-13 ENCOUNTER — Telehealth: Payer: Self-pay | Admitting: *Deleted

## 2019-06-13 ENCOUNTER — Other Ambulatory Visit: Payer: Self-pay | Admitting: Physical Medicine & Rehabilitation

## 2019-06-13 DIAGNOSIS — M25561 Pain in right knee: Secondary | ICD-10-CM

## 2019-06-13 DIAGNOSIS — M25512 Pain in left shoulder: Secondary | ICD-10-CM

## 2019-06-13 DIAGNOSIS — M7062 Trochanteric bursitis, left hip: Secondary | ICD-10-CM

## 2019-06-13 DIAGNOSIS — G8929 Other chronic pain: Secondary | ICD-10-CM

## 2019-06-13 NOTE — Telephone Encounter (Signed)
Received call from Judson Roch [facility] SN with [place]. [phone]~ telephone.   Requested order to extend [facility] SN services 1x every other month for CAP services.   VO given.

## 2019-06-14 ENCOUNTER — Ambulatory Visit: Payer: Medicaid Other | Admitting: Urology

## 2019-06-15 ENCOUNTER — Encounter (INDEPENDENT_AMBULATORY_CARE_PROVIDER_SITE_OTHER): Payer: Medicaid Other | Admitting: Ophthalmology

## 2019-06-15 ENCOUNTER — Other Ambulatory Visit: Payer: Self-pay | Admitting: Family Medicine

## 2019-06-21 ENCOUNTER — Encounter: Payer: Medicaid Other | Admitting: Vascular Surgery

## 2019-06-24 ENCOUNTER — Encounter (INDEPENDENT_AMBULATORY_CARE_PROVIDER_SITE_OTHER): Payer: Medicaid Other | Admitting: Ophthalmology

## 2019-06-28 ENCOUNTER — Encounter: Payer: Self-pay | Admitting: Registered Nurse

## 2019-06-28 ENCOUNTER — Encounter: Payer: Medicaid Other | Attending: Physical Medicine and Rehabilitation | Admitting: Registered Nurse

## 2019-06-28 ENCOUNTER — Other Ambulatory Visit: Payer: Self-pay | Admitting: Registered Nurse

## 2019-06-28 ENCOUNTER — Other Ambulatory Visit: Payer: Self-pay

## 2019-06-28 VITALS — BP 113/75 | HR 95 | Temp 97.7°F | Ht 65.5 in | Wt 305.0 lb

## 2019-06-28 DIAGNOSIS — J449 Chronic obstructive pulmonary disease, unspecified: Secondary | ICD-10-CM | POA: Insufficient documentation

## 2019-06-28 DIAGNOSIS — M7062 Trochanteric bursitis, left hip: Secondary | ICD-10-CM

## 2019-06-28 DIAGNOSIS — Z79891 Long term (current) use of opiate analgesic: Secondary | ICD-10-CM

## 2019-06-28 DIAGNOSIS — M7542 Impingement syndrome of left shoulder: Secondary | ICD-10-CM | POA: Diagnosis not present

## 2019-06-28 DIAGNOSIS — E785 Hyperlipidemia, unspecified: Secondary | ICD-10-CM | POA: Diagnosis not present

## 2019-06-28 DIAGNOSIS — E119 Type 2 diabetes mellitus without complications: Secondary | ICD-10-CM | POA: Insufficient documentation

## 2019-06-28 DIAGNOSIS — M961 Postlaminectomy syndrome, not elsewhere classified: Secondary | ICD-10-CM

## 2019-06-28 DIAGNOSIS — M75101 Unspecified rotator cuff tear or rupture of right shoulder, not specified as traumatic: Secondary | ICD-10-CM

## 2019-06-28 DIAGNOSIS — M47814 Spondylosis without myelopathy or radiculopathy, thoracic region: Secondary | ICD-10-CM | POA: Diagnosis not present

## 2019-06-28 DIAGNOSIS — M47816 Spondylosis without myelopathy or radiculopathy, lumbar region: Secondary | ICD-10-CM | POA: Diagnosis not present

## 2019-06-28 DIAGNOSIS — M25512 Pain in left shoulder: Secondary | ICD-10-CM | POA: Insufficient documentation

## 2019-06-28 DIAGNOSIS — M810 Age-related osteoporosis without current pathological fracture: Secondary | ICD-10-CM | POA: Diagnosis not present

## 2019-06-28 DIAGNOSIS — M79604 Pain in right leg: Secondary | ICD-10-CM | POA: Insufficient documentation

## 2019-06-28 DIAGNOSIS — M7061 Trochanteric bursitis, right hip: Secondary | ICD-10-CM

## 2019-06-28 DIAGNOSIS — K219 Gastro-esophageal reflux disease without esophagitis: Secondary | ICD-10-CM | POA: Diagnosis not present

## 2019-06-28 DIAGNOSIS — G894 Chronic pain syndrome: Secondary | ICD-10-CM | POA: Diagnosis not present

## 2019-06-28 DIAGNOSIS — M7918 Myalgia, other site: Secondary | ICD-10-CM

## 2019-06-28 DIAGNOSIS — Z5181 Encounter for therapeutic drug level monitoring: Secondary | ICD-10-CM | POA: Diagnosis not present

## 2019-06-28 DIAGNOSIS — IMO0002 Reserved for concepts with insufficient information to code with codable children: Secondary | ICD-10-CM

## 2019-06-28 DIAGNOSIS — G8929 Other chronic pain: Secondary | ICD-10-CM | POA: Insufficient documentation

## 2019-06-28 DIAGNOSIS — E039 Hypothyroidism, unspecified: Secondary | ICD-10-CM | POA: Insufficient documentation

## 2019-06-28 DIAGNOSIS — M25551 Pain in right hip: Secondary | ICD-10-CM | POA: Diagnosis not present

## 2019-06-28 DIAGNOSIS — M546 Pain in thoracic spine: Secondary | ICD-10-CM

## 2019-06-28 DIAGNOSIS — F17210 Nicotine dependence, cigarettes, uncomplicated: Secondary | ICD-10-CM | POA: Insufficient documentation

## 2019-06-28 DIAGNOSIS — M5416 Radiculopathy, lumbar region: Secondary | ICD-10-CM

## 2019-06-28 MED ORDER — METHOCARBAMOL 500 MG PO TABS: ORAL_TABLET | ORAL | 2 refills | Status: DC

## 2019-06-28 MED ORDER — HYDROCODONE-ACETAMINOPHEN 10-325 MG PO TABS: ORAL_TABLET | ORAL | 0 refills | Status: DC
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
## 2019-07-02 LAB — TOXASSURE SELECT,+ANTIDEPR,UR

## 2019-07-04 ENCOUNTER — Ambulatory Visit: Payer: Medicaid Other | Admitting: Family Medicine

## 2019-07-06 ENCOUNTER — Telehealth: Payer: Self-pay | Admitting: *Deleted

## 2019-07-06 ENCOUNTER — Other Ambulatory Visit: Payer: Self-pay | Admitting: Nurse Practitioner

## 2019-07-06 NOTE — Telephone Encounter (Signed)
Urine drug screen for this encounter is consistent for prescribed medication 
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
## 2019-07-11 ENCOUNTER — Encounter: Payer: Medicaid Other | Admitting: Physician Assistant
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
## 2019-07-12 ENCOUNTER — Ambulatory Visit: Payer: Medicaid Other | Admitting: Physician Assistant

## 2019-07-12 ENCOUNTER — Ambulatory Visit: Payer: Medicaid Other | Admitting: Family Medicine

## 2019-07-13 ENCOUNTER — Ambulatory Visit: Payer: Medicaid Other | Admitting: Family Medicine

## 2019-07-13 ENCOUNTER — Ambulatory Visit (INDEPENDENT_AMBULATORY_CARE_PROVIDER_SITE_OTHER): Payer: Medicaid Other | Admitting: Gastroenterology

## 2019-07-13 ENCOUNTER — Encounter: Payer: Self-pay | Admitting: Gastroenterology

## 2019-07-13 ENCOUNTER — Other Ambulatory Visit: Payer: Self-pay

## 2019-07-13 ENCOUNTER — Encounter: Payer: Self-pay | Admitting: Internal Medicine

## 2019-07-13 DIAGNOSIS — D509 Iron deficiency anemia, unspecified: Secondary | ICD-10-CM

## 2019-07-13 DIAGNOSIS — R197 Diarrhea, unspecified: Secondary | ICD-10-CM

## 2019-07-13 DIAGNOSIS — K580 Irritable bowel syndrome with diarrhea: Secondary | ICD-10-CM

## 2019-07-13 MED ORDER — DICYCLOMINE HCL 20 MG PO TABS: 20.0000 mg | ORAL_TABLET | Freq: Three times a day (TID) | ORAL | 0 refills | Status: DC

## 2019-07-13 MED ORDER — FUSION PLUS PO CAPS: 1.0000 | ORAL_CAPSULE | Freq: Every day | ORAL | 2 refills | Status: AC

## 2019-07-13 NOTE — Patient Instructions (Signed)
1. Stop hyoscyamine (Levsin) 2. Start dicyclomine 20 mg before meals and at bedtime up to 4 times daily for diarrhea.  Hold if constipation.  Prescription sent to pharmacy. 3. Start fusion plus (iron supplement) 1 daily.  Prescription sent to pharmacy. 4. Go to [facility] for stool studies. 5. Virtual visit in 4 weeks.  Call sooner if diarrhea persist.   6. At some point we need to consider updating colonoscopy for history of iron deficiency but currently holding off due to Covid.   Diarrhea, Adult Diarrhea is frequent loose and watery bowel movements. Diarrhea can make you feel weak and cause you to become dehydrated. Dehydration can make you tired and thirsty, cause you to have a dry mouth, and decrease how often you urinate. Diarrhea typically lasts 2-3 days. However, it can last longer if it is a sign of something more serious. It is important to treat your diarrhea as told by your health care provider. Follow these instructions at home: Eating and drinking     Follow these recommendations as told by your health care provider:  Take an oral rehydration solution (ORS). This is an over-the-counter medicine that helps return your body to its normal balance of nutrients and water. It is found at pharmacies and retail stores.  Drink plenty of fluids, such as water, ice chips, diluted fruit juice, and low-calorie sports drinks. You can drink milk also, if desired.  Avoid drinking fluids that contain a lot of sugar or caffeine, such as energy drinks, sports drinks, and soda.  Eat bland, easy-to-digest foods in small amounts as you are able. These foods include bananas, applesauce, rice, lean meats, toast, and crackers.  Avoid alcohol.  Avoid spicy or fatty foods.  Medicines  Take over-the-counter and prescription medicines only as told by your health care provider.  If you were prescribed an antibiotic medicine, take it as told by your health care provider. Do not stop using the antibiotic  even if you start to feel better. General instructions   Wash your hands often using soap and water. If soap and water are not available, use a hand sanitizer. Others in the household should wash their hands as well. Hands should be washed: ? After using the toilet or changing a diaper. ? Before preparing, cooking, or serving food. ? While caring for a sick person or while visiting someone in a hospital.  Drink enough fluid to keep your urine pale yellow.  Rest at home while you recover.  Watch your condition for any changes.  Take a warm bath to relieve any burning or pain from frequent diarrhea episodes.  Keep all follow-up visits as told by your health care provider. This is important. Contact a health care provider if:  You have a fever.  Your diarrhea gets worse.  You have new symptoms.  You cannot keep fluids down.  You feel light-headed or dizzy.  You have a headache.  You have muscle cramps. Get help right away if:  You have chest pain.  You feel extremely weak or you faint.  You have bloody or black stools or stools that look like tar.  You have severe pain, cramping, or bloating in your abdomen.  You have trouble breathing or you are breathing very quickly.  Your heart is beating very quickly.  Your skin feels cold and clammy.  You feel confused.  You have signs of dehydration, such as: ? Dark urine, very little urine, or no urine. ? Cracked lips. ? Dry mouth. ?  Sunken eyes. ? Sleepiness. ? Weakness. Summary  Diarrhea is frequent loose and watery bowel movements. Diarrhea can make you feel weak and cause you to become dehydrated.  Drink enough fluids to keep your urine pale yellow.  Make sure that you wash your hands after using the toilet. If soap and water are not available, use hand sanitizer.  Contact a health care provider if your diarrhea gets worse or you have new symptoms.  Get help right away if you have signs of dehydration.  This information is not intended to replace advice given to you by your health care provider. Make sure you discuss any questions you have with your health care provider. Document Released: 08/15/2002 Document Revised: 01/29/2018 Document Reviewed: 01/29/2018 Elsevier Patient Education  2020 [employer].
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
## 2019-07-13 NOTE — Progress Notes (Signed)
cc'ed to pcp °

## 2019-07-18 ENCOUNTER — Ambulatory Visit: Payer: Medicaid Other | Admitting: Family Medicine

## 2019-07-19 ENCOUNTER — Encounter: Payer: Self-pay | Admitting: Physician Assistant

## 2019-07-19 ENCOUNTER — Other Ambulatory Visit: Payer: Self-pay | Admitting: Registered Nurse

## 2019-07-19 ENCOUNTER — Other Ambulatory Visit: Payer: Self-pay

## 2019-07-19 ENCOUNTER — Encounter: Payer: Self-pay | Admitting: *Deleted

## 2019-07-19 ENCOUNTER — Telehealth (INDEPENDENT_AMBULATORY_CARE_PROVIDER_SITE_OTHER): Payer: Medicaid Other | Admitting: Physician Assistant

## 2019-07-19 ENCOUNTER — Other Ambulatory Visit: Payer: Self-pay | Admitting: Family Medicine

## 2019-07-19 ENCOUNTER — Other Ambulatory Visit: Payer: Self-pay | Admitting: Nurse Practitioner

## 2019-07-19 VITALS — Ht 65.5 in | Wt 300.0 lb

## 2019-07-19 DIAGNOSIS — E118 Type 2 diabetes mellitus with unspecified complications: Secondary | ICD-10-CM

## 2019-07-19 DIAGNOSIS — I3139 Other pericardial effusion (noninflammatory): Secondary | ICD-10-CM

## 2019-07-19 DIAGNOSIS — I313 Pericardial effusion (noninflammatory): Secondary | ICD-10-CM

## 2019-07-19 DIAGNOSIS — Z01818 Encounter for other preprocedural examination: Secondary | ICD-10-CM | POA: Diagnosis not present

## 2019-07-19 DIAGNOSIS — E782 Mixed hyperlipidemia: Secondary | ICD-10-CM

## 2019-07-19 DIAGNOSIS — Z720 Tobacco use: Secondary | ICD-10-CM | POA: Diagnosis not present

## 2019-07-19 DIAGNOSIS — Z794 Long term (current) use of insulin: Secondary | ICD-10-CM

## 2019-07-19 DIAGNOSIS — R600 Localized edema: Secondary | ICD-10-CM

## 2019-07-19 DIAGNOSIS — Z8249 Family history of ischemic heart disease and other diseases of the circulatory system: Secondary | ICD-10-CM

## 2019-07-19 NOTE — Patient Instructions (Signed)
Medication Instructions:  Your physician recommends that you continue on your current medications as directed. Please refer to the Current Medication list given to you today.  *If you need a refill on your cardiac medications before your next appointment, please call your pharmacy*  Lab Work: NONE  If you have labs (blood work) drawn today and your tests are completely normal, you will receive your results only by: Marland Kitchen MyChart Message (if you have MyChart) OR . A paper copy in the mail If you have any lab test that is abnormal or we need to change your treatment, we will call you to review the results.  Testing/Procedures: Your physician has requested that you have a lexiscan myoview. For further information please visit [email or web address]. Please follow instruction sheet, as given.  Your physician has requested that you have an echocardiogram. Echocardiography is a painless test that uses sound waves to create images of your heart. It provides your doctor with information about the size and shape of your heart and how well your heart's chambers and valves are working. This procedure takes approximately one hour. There are no restrictions for this procedure.  Follow-Up: At [facility], you and your health needs are our priority.  As part of our continuing mission to provide you with exceptional heart care, we have created designated Provider Care Teams.  These Care Teams include your primary Cardiologist (physician) and Advanced Practice Providers (APPs -  Physician Assistants and Nurse Practitioners) who all work together to provide you with the care you need, when you need it.  Your next appointment:   3 Weeks   The format for your next appointment:   In Person  Provider:   Ermalinda Barrios, PA-C  Other Instructions Thank you for choosing [facility]!   Steps to Quit Smoking Smoking tobacco is the leading cause of preventable death. It can affect almost every organ in the  body. Smoking puts you and those around you at risk for developing many serious chronic diseases. Quitting smoking can be difficult, but it is one of the best things that you can do for your health. It is never too late to quit. How do I get ready to quit? When you decide to quit smoking, create a plan to help you succeed. Before you quit:  Pick a date to quit. Set a date within the next 2 weeks to give you time to prepare.  Write down the reasons why you are quitting. Keep this list in places where you will see it often.  Tell your family, friends, and co-workers that you are quitting. Support from your loved ones can make quitting easier.  Talk with your health care provider about your options for quitting smoking.  Find out what treatment options are covered by your health insurance.  Identify people, places, things, and activities that make you want to smoke (triggers). Avoid them. What first steps can I take to quit smoking?  Throw away all cigarettes at home, at work, and in your car.  Throw away smoking accessories, such as Scientist, research (medical).  Clean your car. Make sure to empty the ashtray.  Clean your home, including curtains and carpets. What strategies can I use to quit smoking? Talk with your health care provider about combining strategies, such as taking medicines while you are also receiving in-person counseling. Using these two strategies together makes you more likely to succeed in quitting than if you used either strategy on its own.  If you are pregnant or  breastfeeding, talk with your health care provider about finding counseling or other support strategies to quit smoking. Do not take medicine to help you quit smoking unless your health care provider tells you to do so. To quit smoking: Quit right away  Quit smoking completely, instead of gradually reducing how much you smoke over a period of time. Research shows that stopping smoking right away is more  successful than gradually quitting.  Attend in-person counseling to help you build problem-solving skills. You are more likely to succeed in quitting if you attend counseling sessions regularly. Even short sessions of 10 minutes can be effective. Take medicine You may take medicines to help you quit smoking. Some medicines require a prescription and some you can purchase over-the-counter. Medicines may have nicotine in them to replace the nicotine in cigarettes. Medicines may:  Help to stop cravings.  Help to relieve withdrawal symptoms. Your health care provider may recommend:  Nicotine patches, gum, or lozenges.  Nicotine inhalers or sprays.  Non-nicotine medicine that is taken by mouth. Find resources Find resources and support systems that can help you to quit smoking and remain smoke-free after you quit. These resources are most helpful when you use them often. They include:  Online chats with a Social worker.  Telephone quitlines.  Printed Furniture conservator/restorer.  Support groups or group counseling.  Text messaging programs.  Mobile phone apps or applications. Use apps that can help you stick to your quit plan by providing reminders, tips, and encouragement. There are many free apps for mobile devices as well as websites. Examples include Quit Guide from the [employer] and smokefree.gov What things can I do to make it easier to quit?   Reach out to your family and friends for support and encouragement. Call telephone quitlines (1-800-QUIT-NOW), reach out to support groups, or work with a counselor for support.  Ask people who smoke to avoid smoking around you.  Avoid places that trigger you to smoke, such as bars, parties, or smoke-break areas at work.  Spend time with people who do not smoke.  Lessen the stress in your life. Stress can be a smoking trigger for some people. To lessen stress, try: ? Exercising regularly. ? Doing deep-breathing exercises. ? Doing yoga. ?  Meditating. ? Performing a body scan. This involves closing your eyes, scanning your body from head to toe, and noticing which parts of your body are particularly tense. Try to relax the muscles in those areas. How will I feel when I quit smoking? Day 1 to 3 weeks Within the first 24 hours of quitting smoking, you may start to feel withdrawal symptoms. These symptoms are usually most noticeable 2-3 days after quitting, but they usually do not last for more than 2-3 weeks. You may experience these symptoms:  Mood swings.  Restlessness, anxiety, or irritability.  Trouble concentrating.  Dizziness.  Strong cravings for sugary foods and nicotine.  Mild weight gain.  Constipation.  Nausea.  Coughing or a sore throat.  Changes in how the medicines that you take for unrelated issues work in your body.  Depression.  Trouble sleeping (insomnia). Week 3 and afterward After the first 2-3 weeks of quitting, you may start to notice more positive results, such as:  Improved sense of smell and taste.  Decreased coughing and sore throat.  Slower heart rate.  Lower blood pressure.  Clearer skin.  The ability to breathe more easily.  Fewer sick days. Quitting smoking can be very challenging. Do not get discouraged if you  are not successful the first time. Some people need to make many attempts to quit before they achieve long-term success. Do your best to stick to your quit plan, and talk with your health care provider if you have any questions or concerns. Summary  Smoking tobacco is the leading cause of preventable death. Quitting smoking is one of the best things that you can do for your health.  When you decide to quit smoking, create a plan to help you succeed.  Quit smoking right away, not slowly over a period of time.  When you start quitting, seek help from your health care provider, family, or friends. This information is not intended to replace advice given to you by  your health care provider. Make sure you discuss any questions you have with your health care provider. Document Released: 08/19/2001 Document Revised: 11/12/2018 Document Reviewed: 11/13/2018 Elsevier Patient Education  2020 [employer].

## 2019-07-20 ENCOUNTER — Other Ambulatory Visit: Payer: Self-pay | Admitting: Family Medicine

## 2019-07-20 ENCOUNTER — Other Ambulatory Visit: Payer: Self-pay | Admitting: Gastroenterology

## 2019-07-20 ENCOUNTER — Other Ambulatory Visit: Payer: Self-pay | Admitting: Nurse Practitioner

## 2019-07-26 ENCOUNTER — Ambulatory Visit: Payer: Medicaid Other | Admitting: Urology

## 2019-07-26 ENCOUNTER — Other Ambulatory Visit: Payer: Self-pay | Admitting: Physical Medicine & Rehabilitation

## 2019-07-27 ENCOUNTER — Other Ambulatory Visit (HOSPITAL_COMMUNITY): Payer: Medicaid Other

## 2019-07-27 ENCOUNTER — Encounter (HOSPITAL_COMMUNITY): Payer: Medicaid Other

## 2019-07-28 ENCOUNTER — Encounter (HOSPITAL_COMMUNITY): Payer: Medicaid Other

## 2019-07-28 ENCOUNTER — Other Ambulatory Visit (HOSPITAL_COMMUNITY): Payer: Medicaid Other

## 2019-07-28 ENCOUNTER — Encounter (HOSPITAL_COMMUNITY): Payer: Medicaid Other | Attending: Physician Assistant

## 2019-07-28 ENCOUNTER — Ambulatory Visit (HOSPITAL_COMMUNITY): Payer: Medicaid Other

## 2019-08-01 ENCOUNTER — Encounter: Payer: Self-pay | Admitting: Registered Nurse

## 2019-08-01 ENCOUNTER — Other Ambulatory Visit: Payer: Self-pay

## 2019-08-01 ENCOUNTER — Encounter: Payer: Medicaid Other | Attending: Physical Medicine and Rehabilitation | Admitting: Registered Nurse

## 2019-08-01 VITALS — BP 119/62 | HR 102 | Temp 97.7°F | Ht 65.5 in | Wt 300.0 lb

## 2019-08-01 DIAGNOSIS — G8929 Other chronic pain: Secondary | ICD-10-CM | POA: Insufficient documentation

## 2019-08-01 DIAGNOSIS — M79604 Pain in right leg: Secondary | ICD-10-CM | POA: Diagnosis not present

## 2019-08-01 DIAGNOSIS — M25512 Pain in left shoulder: Secondary | ICD-10-CM | POA: Diagnosis not present

## 2019-08-01 DIAGNOSIS — M546 Pain in thoracic spine: Secondary | ICD-10-CM

## 2019-08-01 DIAGNOSIS — M961 Postlaminectomy syndrome, not elsewhere classified: Secondary | ICD-10-CM | POA: Diagnosis not present

## 2019-08-01 DIAGNOSIS — Z5181 Encounter for therapeutic drug level monitoring: Secondary | ICD-10-CM

## 2019-08-01 DIAGNOSIS — E039 Hypothyroidism, unspecified: Secondary | ICD-10-CM | POA: Insufficient documentation

## 2019-08-01 DIAGNOSIS — M7918 Myalgia, other site: Secondary | ICD-10-CM

## 2019-08-01 DIAGNOSIS — F17210 Nicotine dependence, cigarettes, uncomplicated: Secondary | ICD-10-CM | POA: Insufficient documentation

## 2019-08-01 DIAGNOSIS — M75101 Unspecified rotator cuff tear or rupture of right shoulder, not specified as traumatic: Secondary | ICD-10-CM

## 2019-08-01 DIAGNOSIS — M47816 Spondylosis without myelopathy or radiculopathy, lumbar region: Secondary | ICD-10-CM | POA: Insufficient documentation

## 2019-08-01 DIAGNOSIS — M7061 Trochanteric bursitis, right hip: Secondary | ICD-10-CM

## 2019-08-01 DIAGNOSIS — M47814 Spondylosis without myelopathy or radiculopathy, thoracic region: Secondary | ICD-10-CM | POA: Insufficient documentation

## 2019-08-01 DIAGNOSIS — M7542 Impingement syndrome of left shoulder: Secondary | ICD-10-CM

## 2019-08-01 DIAGNOSIS — M5416 Radiculopathy, lumbar region: Secondary | ICD-10-CM

## 2019-08-01 DIAGNOSIS — M25551 Pain in right hip: Secondary | ICD-10-CM | POA: Diagnosis not present

## 2019-08-01 DIAGNOSIS — E119 Type 2 diabetes mellitus without complications: Secondary | ICD-10-CM | POA: Diagnosis not present

## 2019-08-01 DIAGNOSIS — J449 Chronic obstructive pulmonary disease, unspecified: Secondary | ICD-10-CM | POA: Insufficient documentation

## 2019-08-01 DIAGNOSIS — K219 Gastro-esophageal reflux disease without esophagitis: Secondary | ICD-10-CM | POA: Insufficient documentation

## 2019-08-01 DIAGNOSIS — M810 Age-related osteoporosis without current pathological fracture: Secondary | ICD-10-CM | POA: Diagnosis not present

## 2019-08-01 DIAGNOSIS — G894 Chronic pain syndrome: Secondary | ICD-10-CM

## 2019-08-01 DIAGNOSIS — E785 Hyperlipidemia, unspecified: Secondary | ICD-10-CM | POA: Diagnosis not present

## 2019-08-01 DIAGNOSIS — IMO0002 Reserved for concepts with insufficient information to code with codable children: Secondary | ICD-10-CM

## 2019-08-01 DIAGNOSIS — Z79891 Long term (current) use of opiate analgesic: Secondary | ICD-10-CM

## 2019-08-01 DIAGNOSIS — M7062 Trochanteric bursitis, left hip: Secondary | ICD-10-CM

## 2019-08-01 MED ORDER — HYDROCODONE-ACETAMINOPHEN 10-325 MG PO TABS: ORAL_TABLET | ORAL | 0 refills | Status: DC
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
## 2019-08-02 ENCOUNTER — Ambulatory Visit (HOSPITAL_COMMUNITY): Payer: Medicaid Other | Attending: Physician Assistant

## 2019-08-09 ENCOUNTER — Encounter: Payer: Medicaid Other | Admitting: Vascular Surgery

## 2019-08-09 ENCOUNTER — Telehealth: Payer: Self-pay | Admitting: *Deleted

## 2019-08-09 NOTE — Telephone Encounter (Signed)
Agree with below. Addendum:    I believe that she has limits with moving and including, toileting, bathing, feeding, dressing and grooming. I believe the power wheelchair is needed for pt to be able to perform ADL's in her home

## 2019-08-09 NOTE — Telephone Encounter (Signed)
Received call from Manuela Schwartz Between with [facility] [phone]~ telephone.   Requested OV to continue CAP services 1x evry 2 months.   VO given.

## 2019-08-10 ENCOUNTER — Encounter: Payer: Medicaid Other | Admitting: Family Medicine

## 2019-08-12 ENCOUNTER — Encounter: Payer: Self-pay | Admitting: Internal Medicine

## 2019-08-12 ENCOUNTER — Telehealth: Payer: Self-pay | Admitting: Internal Medicine

## 2019-08-12 ENCOUNTER — Ambulatory Visit: Payer: Medicaid Other | Admitting: Gastroenterology

## 2019-08-12 ENCOUNTER — Other Ambulatory Visit: Payer: Self-pay

## 2019-08-12 ENCOUNTER — Telehealth: Payer: Self-pay

## 2019-08-12 NOTE — Telephone Encounter (Signed)
Noted. Officially a no show.

## 2019-08-12 NOTE — Telephone Encounter (Signed)
Tried to call pt at 9:55am to start doxy visit, no answer, LMOVM for return call. No return call as of 10:15am.

## 2019-08-12 NOTE — Telephone Encounter (Signed)
PATIENT WAS A NO SHOW/NO ANSWER AND LETTER SENT  °

## 2019-08-15 ENCOUNTER — Other Ambulatory Visit: Payer: Self-pay | Admitting: Nurse Practitioner

## 2019-08-16 ENCOUNTER — Other Ambulatory Visit: Payer: Self-pay | Admitting: Registered Nurse

## 2019-08-17 ENCOUNTER — Other Ambulatory Visit: Payer: Self-pay

## 2019-08-17 ENCOUNTER — Ambulatory Visit (INDEPENDENT_AMBULATORY_CARE_PROVIDER_SITE_OTHER): Payer: Medicaid Other | Admitting: Gastroenterology

## 2019-08-17 ENCOUNTER — Encounter: Payer: Self-pay | Admitting: Gastroenterology

## 2019-08-17 ENCOUNTER — Other Ambulatory Visit: Payer: Self-pay | Admitting: Nurse Practitioner

## 2019-08-17 DIAGNOSIS — K219 Gastro-esophageal reflux disease without esophagitis: Secondary | ICD-10-CM | POA: Diagnosis not present

## 2019-08-17 DIAGNOSIS — D509 Iron deficiency anemia, unspecified: Secondary | ICD-10-CM | POA: Diagnosis not present

## 2019-08-17 DIAGNOSIS — K580 Irritable bowel syndrome with diarrhea: Secondary | ICD-10-CM | POA: Diagnosis not present
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
## 2019-08-17 NOTE — Patient Instructions (Signed)
1. Continue dicyclomine 20 mg up to 4 times daily to control diarrhea. 2. Continue pantoprazole 40 mg twice daily before breakfast and evening meal. 3. Collect stool and return to the lab for testing. 4. We will see you back in February, virtual visit.  Consider updating labs for your iron deficiency anemia at that time.  If you notice lightheadedness, increased weakness, shortness of breath please call and we will update lab sooner as this could be a sign of progressive anemia.

## 2019-08-17 NOTE — Progress Notes (Signed)
cc'ed to pcp °

## 2019-08-18 ENCOUNTER — Encounter: Payer: Self-pay | Admitting: Internal Medicine

## 2019-09-05 ENCOUNTER — Encounter: Payer: Medicaid Other | Attending: Physical Medicine and Rehabilitation | Admitting: Registered Nurse

## 2019-09-05 ENCOUNTER — Other Ambulatory Visit: Payer: Self-pay

## 2019-09-05 ENCOUNTER — Encounter: Payer: Self-pay | Admitting: Registered Nurse

## 2019-09-05 VITALS — HR 78 | Temp 98.0°F | Ht 65.5 in | Wt 300.0 lb

## 2019-09-05 DIAGNOSIS — M62838 Other muscle spasm: Secondary | ICD-10-CM

## 2019-09-05 DIAGNOSIS — M79604 Pain in right leg: Secondary | ICD-10-CM | POA: Insufficient documentation

## 2019-09-05 DIAGNOSIS — M961 Postlaminectomy syndrome, not elsewhere classified: Secondary | ICD-10-CM | POA: Diagnosis not present

## 2019-09-05 DIAGNOSIS — IMO0002 Reserved for concepts with insufficient information to code with codable children: Secondary | ICD-10-CM

## 2019-09-05 DIAGNOSIS — Z720 Tobacco use: Secondary | ICD-10-CM

## 2019-09-05 DIAGNOSIS — M7542 Impingement syndrome of left shoulder: Secondary | ICD-10-CM

## 2019-09-05 DIAGNOSIS — G8929 Other chronic pain: Secondary | ICD-10-CM | POA: Diagnosis not present

## 2019-09-05 DIAGNOSIS — F17210 Nicotine dependence, cigarettes, uncomplicated: Secondary | ICD-10-CM | POA: Insufficient documentation

## 2019-09-05 DIAGNOSIS — E039 Hypothyroidism, unspecified: Secondary | ICD-10-CM | POA: Insufficient documentation

## 2019-09-05 DIAGNOSIS — J449 Chronic obstructive pulmonary disease, unspecified: Secondary | ICD-10-CM | POA: Insufficient documentation

## 2019-09-05 DIAGNOSIS — M7918 Myalgia, other site: Secondary | ICD-10-CM

## 2019-09-05 DIAGNOSIS — M810 Age-related osteoporosis without current pathological fracture: Secondary | ICD-10-CM | POA: Insufficient documentation

## 2019-09-05 DIAGNOSIS — M5412 Radiculopathy, cervical region: Secondary | ICD-10-CM

## 2019-09-05 DIAGNOSIS — M47814 Spondylosis without myelopathy or radiculopathy, thoracic region: Secondary | ICD-10-CM | POA: Insufficient documentation

## 2019-09-05 DIAGNOSIS — Z5181 Encounter for therapeutic drug level monitoring: Secondary | ICD-10-CM

## 2019-09-05 DIAGNOSIS — M546 Pain in thoracic spine: Secondary | ICD-10-CM

## 2019-09-05 DIAGNOSIS — M7061 Trochanteric bursitis, right hip: Secondary | ICD-10-CM

## 2019-09-05 DIAGNOSIS — M542 Cervicalgia: Secondary | ICD-10-CM

## 2019-09-05 DIAGNOSIS — M545 Low back pain: Secondary | ICD-10-CM

## 2019-09-05 DIAGNOSIS — Z79891 Long term (current) use of opiate analgesic: Secondary | ICD-10-CM

## 2019-09-05 DIAGNOSIS — K219 Gastro-esophageal reflux disease without esophagitis: Secondary | ICD-10-CM | POA: Insufficient documentation

## 2019-09-05 DIAGNOSIS — M25512 Pain in left shoulder: Secondary | ICD-10-CM | POA: Insufficient documentation

## 2019-09-05 DIAGNOSIS — M5416 Radiculopathy, lumbar region: Secondary | ICD-10-CM | POA: Diagnosis not present

## 2019-09-05 DIAGNOSIS — G894 Chronic pain syndrome: Secondary | ICD-10-CM

## 2019-09-05 DIAGNOSIS — M25551 Pain in right hip: Secondary | ICD-10-CM | POA: Insufficient documentation

## 2019-09-05 DIAGNOSIS — F329 Major depressive disorder, single episode, unspecified: Secondary | ICD-10-CM

## 2019-09-05 DIAGNOSIS — E119 Type 2 diabetes mellitus without complications: Secondary | ICD-10-CM | POA: Insufficient documentation

## 2019-09-05 DIAGNOSIS — M47816 Spondylosis without myelopathy or radiculopathy, lumbar region: Secondary | ICD-10-CM | POA: Insufficient documentation

## 2019-09-05 DIAGNOSIS — E785 Hyperlipidemia, unspecified: Secondary | ICD-10-CM | POA: Insufficient documentation

## 2019-09-05 DIAGNOSIS — M7062 Trochanteric bursitis, left hip: Secondary | ICD-10-CM

## 2019-09-05 DIAGNOSIS — M75101 Unspecified rotator cuff tear or rupture of right shoulder, not specified as traumatic: Secondary | ICD-10-CM

## 2019-09-05 MED ORDER — HYDROCODONE-ACETAMINOPHEN 10-325 MG PO TABS: 1.0000 | ORAL_TABLET | Freq: Three times a day (TID) | ORAL | 0 refills | Status: DC | PRN
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
## 2019-09-06 ENCOUNTER — Encounter: Payer: Medicaid Other | Admitting: Vascular Surgery

## 2019-09-13 ENCOUNTER — Ambulatory Visit: Payer: Medicaid Other | Admitting: Urology

## 2019-09-14 ENCOUNTER — Other Ambulatory Visit: Payer: Self-pay | Admitting: Nurse Practitioner

## 2019-09-14 ENCOUNTER — Other Ambulatory Visit: Payer: Self-pay | Admitting: Gastroenterology

## 2019-09-15 ENCOUNTER — Ambulatory Visit: Payer: Medicaid Other | Admitting: Student

## 2019-09-15 ENCOUNTER — Encounter: Payer: Self-pay | Admitting: Student
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
## 2019-09-19 ENCOUNTER — Other Ambulatory Visit: Payer: Self-pay | Admitting: Gastroenterology

## 2019-09-29 ENCOUNTER — Other Ambulatory Visit: Payer: Self-pay | Admitting: Nurse Practitioner

## 2019-10-04 ENCOUNTER — Encounter: Payer: Medicaid Other | Attending: Physical Medicine and Rehabilitation | Admitting: Registered Nurse

## 2019-10-04 ENCOUNTER — Other Ambulatory Visit: Payer: Self-pay

## 2019-10-04 ENCOUNTER — Encounter: Payer: Self-pay | Admitting: Registered Nurse

## 2019-10-04 VITALS — Ht 65.5 in | Wt 300.0 lb

## 2019-10-04 DIAGNOSIS — M961 Postlaminectomy syndrome, not elsewhere classified: Secondary | ICD-10-CM | POA: Diagnosis not present

## 2019-10-04 DIAGNOSIS — E039 Hypothyroidism, unspecified: Secondary | ICD-10-CM | POA: Insufficient documentation

## 2019-10-04 DIAGNOSIS — E119 Type 2 diabetes mellitus without complications: Secondary | ICD-10-CM | POA: Insufficient documentation

## 2019-10-04 DIAGNOSIS — IMO0002 Reserved for concepts with insufficient information to code with codable children: Secondary | ICD-10-CM

## 2019-10-04 DIAGNOSIS — M25512 Pain in left shoulder: Secondary | ICD-10-CM | POA: Insufficient documentation

## 2019-10-04 DIAGNOSIS — M7542 Impingement syndrome of left shoulder: Secondary | ICD-10-CM | POA: Diagnosis not present

## 2019-10-04 DIAGNOSIS — M810 Age-related osteoporosis without current pathological fracture: Secondary | ICD-10-CM | POA: Insufficient documentation

## 2019-10-04 DIAGNOSIS — M7918 Myalgia, other site: Secondary | ICD-10-CM

## 2019-10-04 DIAGNOSIS — J449 Chronic obstructive pulmonary disease, unspecified: Secondary | ICD-10-CM | POA: Insufficient documentation

## 2019-10-04 DIAGNOSIS — M5416 Radiculopathy, lumbar region: Secondary | ICD-10-CM

## 2019-10-04 DIAGNOSIS — Z5181 Encounter for therapeutic drug level monitoring: Secondary | ICD-10-CM

## 2019-10-04 DIAGNOSIS — E785 Hyperlipidemia, unspecified: Secondary | ICD-10-CM | POA: Insufficient documentation

## 2019-10-04 DIAGNOSIS — K219 Gastro-esophageal reflux disease without esophagitis: Secondary | ICD-10-CM | POA: Insufficient documentation

## 2019-10-04 DIAGNOSIS — M546 Pain in thoracic spine: Secondary | ICD-10-CM

## 2019-10-04 DIAGNOSIS — M25551 Pain in right hip: Secondary | ICD-10-CM | POA: Insufficient documentation

## 2019-10-04 DIAGNOSIS — M7062 Trochanteric bursitis, left hip: Secondary | ICD-10-CM

## 2019-10-04 DIAGNOSIS — M75101 Unspecified rotator cuff tear or rupture of right shoulder, not specified as traumatic: Secondary | ICD-10-CM | POA: Diagnosis not present

## 2019-10-04 DIAGNOSIS — F17210 Nicotine dependence, cigarettes, uncomplicated: Secondary | ICD-10-CM | POA: Insufficient documentation

## 2019-10-04 DIAGNOSIS — M79604 Pain in right leg: Secondary | ICD-10-CM | POA: Insufficient documentation

## 2019-10-04 DIAGNOSIS — Z79891 Long term (current) use of opiate analgesic: Secondary | ICD-10-CM

## 2019-10-04 DIAGNOSIS — M47814 Spondylosis without myelopathy or radiculopathy, thoracic region: Secondary | ICD-10-CM | POA: Insufficient documentation

## 2019-10-04 DIAGNOSIS — M7061 Trochanteric bursitis, right hip: Secondary | ICD-10-CM

## 2019-10-04 DIAGNOSIS — M47816 Spondylosis without myelopathy or radiculopathy, lumbar region: Secondary | ICD-10-CM | POA: Insufficient documentation

## 2019-10-04 DIAGNOSIS — G894 Chronic pain syndrome: Secondary | ICD-10-CM

## 2019-10-04 DIAGNOSIS — G8929 Other chronic pain: Secondary | ICD-10-CM

## 2019-10-04 MED ORDER — GABAPENTIN 300 MG PO CAPS: ORAL_CAPSULE | ORAL | 3 refills | Status: DC

## 2019-10-04 MED ORDER — HYDROCODONE-ACETAMINOPHEN 10-325 MG PO TABS: 1.0000 | ORAL_TABLET | Freq: Three times a day (TID) | ORAL | 0 refills | Status: DC | PRN
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
## 2019-10-10 ENCOUNTER — Telehealth: Payer: Self-pay | Admitting: *Deleted

## 2019-10-10 NOTE — Telephone Encounter (Signed)
Noted agree with below 

## 2019-10-10 NOTE — Telephone Encounter (Signed)
Received call from [place], [place] with [facility] [phone]~ telephone.   Requested VO to extend [facility] SN services Q2 months for re-certfication of CAP services. VO given.   Also reports that patient had unobserved fall 2 weeks prior with no apparent injury. Advised that they will continue to monitor and work on fall prevention.

## 2019-10-18 ENCOUNTER — Other Ambulatory Visit: Payer: Self-pay | Admitting: Physical Medicine & Rehabilitation

## 2019-10-19 ENCOUNTER — Encounter: Payer: Self-pay | Admitting: Gastroenterology

## 2019-10-19 ENCOUNTER — Ambulatory Visit (INDEPENDENT_AMBULATORY_CARE_PROVIDER_SITE_OTHER): Payer: Medicaid Other | Admitting: Gastroenterology

## 2019-10-19 ENCOUNTER — Other Ambulatory Visit: Payer: Self-pay

## 2019-10-19 ENCOUNTER — Encounter: Payer: Self-pay | Admitting: Internal Medicine

## 2019-10-19 DIAGNOSIS — K219 Gastro-esophageal reflux disease without esophagitis: Secondary | ICD-10-CM

## 2019-10-19 DIAGNOSIS — D509 Iron deficiency anemia, unspecified: Secondary | ICD-10-CM

## 2019-10-19 DIAGNOSIS — K580 Irritable bowel syndrome with diarrhea: Secondary | ICD-10-CM | POA: Diagnosis not present
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
## 2019-10-19 NOTE — Progress Notes (Signed)
Cc'ed to pcp °

## 2019-10-19 NOTE — Patient Instructions (Signed)
1. We will follow-up with you in 4 months to see if pandemic conditions have improved and you are ready to update labs for iron deficiency anemia.  At any time would offer you colonoscopy with possible upper endoscopy to evaluate your anemia.  Also you will be due this year for colonoscopy for history of colon polyps. 2. Continue pantoprazole 40 mg twice daily before a meal. 3. Continue dicyclomine 20 mg up to 4 times daily as needed for abdominal cramping and diarrhea.  Use only when needed.

## 2019-10-21 ENCOUNTER — Other Ambulatory Visit: Payer: Self-pay | Admitting: Family Medicine

## 2019-10-25 ENCOUNTER — Ambulatory Visit: Payer: Medicaid Other | Admitting: Urology

## 2019-10-26 ENCOUNTER — Encounter: Payer: Medicaid Other | Admitting: Family Medicine

## 2019-11-01 ENCOUNTER — Ambulatory Visit: Payer: Medicaid Other | Admitting: Urology

## 2019-11-02 ENCOUNTER — Encounter: Payer: Medicaid Other | Admitting: Registered Nurse

## 2019-11-16 ENCOUNTER — Other Ambulatory Visit: Payer: Self-pay

## 2019-11-16 ENCOUNTER — Encounter: Payer: Self-pay | Admitting: Registered Nurse

## 2019-11-16 ENCOUNTER — Encounter: Payer: Medicaid Other | Attending: Physical Medicine and Rehabilitation | Admitting: Registered Nurse

## 2019-11-16 VITALS — BP 120/61 | HR 108 | Temp 98.1°F

## 2019-11-16 DIAGNOSIS — M25551 Pain in right hip: Secondary | ICD-10-CM | POA: Insufficient documentation

## 2019-11-16 DIAGNOSIS — M25512 Pain in left shoulder: Secondary | ICD-10-CM | POA: Diagnosis not present

## 2019-11-16 DIAGNOSIS — Z79891 Long term (current) use of opiate analgesic: Secondary | ICD-10-CM

## 2019-11-16 DIAGNOSIS — M47816 Spondylosis without myelopathy or radiculopathy, lumbar region: Secondary | ICD-10-CM | POA: Diagnosis not present

## 2019-11-16 DIAGNOSIS — E119 Type 2 diabetes mellitus without complications: Secondary | ICD-10-CM | POA: Insufficient documentation

## 2019-11-16 DIAGNOSIS — M47814 Spondylosis without myelopathy or radiculopathy, thoracic region: Secondary | ICD-10-CM | POA: Diagnosis not present

## 2019-11-16 DIAGNOSIS — E039 Hypothyroidism, unspecified: Secondary | ICD-10-CM | POA: Insufficient documentation

## 2019-11-16 DIAGNOSIS — M79604 Pain in right leg: Secondary | ICD-10-CM | POA: Diagnosis not present

## 2019-11-16 DIAGNOSIS — M75101 Unspecified rotator cuff tear or rupture of right shoulder, not specified as traumatic: Secondary | ICD-10-CM

## 2019-11-16 DIAGNOSIS — M7062 Trochanteric bursitis, left hip: Secondary | ICD-10-CM

## 2019-11-16 DIAGNOSIS — M810 Age-related osteoporosis without current pathological fracture: Secondary | ICD-10-CM | POA: Insufficient documentation

## 2019-11-16 DIAGNOSIS — E785 Hyperlipidemia, unspecified: Secondary | ICD-10-CM | POA: Insufficient documentation

## 2019-11-16 DIAGNOSIS — K219 Gastro-esophageal reflux disease without esophagitis: Secondary | ICD-10-CM | POA: Insufficient documentation

## 2019-11-16 DIAGNOSIS — IMO0002 Reserved for concepts with insufficient information to code with codable children: Secondary | ICD-10-CM

## 2019-11-16 DIAGNOSIS — J449 Chronic obstructive pulmonary disease, unspecified: Secondary | ICD-10-CM | POA: Diagnosis not present

## 2019-11-16 DIAGNOSIS — G894 Chronic pain syndrome: Secondary | ICD-10-CM

## 2019-11-16 DIAGNOSIS — G8929 Other chronic pain: Secondary | ICD-10-CM | POA: Insufficient documentation

## 2019-11-16 DIAGNOSIS — M5416 Radiculopathy, lumbar region: Secondary | ICD-10-CM | POA: Diagnosis not present

## 2019-11-16 DIAGNOSIS — M961 Postlaminectomy syndrome, not elsewhere classified: Secondary | ICD-10-CM

## 2019-11-16 DIAGNOSIS — F17210 Nicotine dependence, cigarettes, uncomplicated: Secondary | ICD-10-CM | POA: Insufficient documentation

## 2019-11-16 DIAGNOSIS — Z5181 Encounter for therapeutic drug level monitoring: Secondary | ICD-10-CM

## 2019-11-16 DIAGNOSIS — M7542 Impingement syndrome of left shoulder: Secondary | ICD-10-CM

## 2019-11-16 DIAGNOSIS — M7918 Myalgia, other site: Secondary | ICD-10-CM

## 2019-11-16 DIAGNOSIS — M546 Pain in thoracic spine: Secondary | ICD-10-CM

## 2019-11-16 DIAGNOSIS — M7061 Trochanteric bursitis, right hip: Secondary | ICD-10-CM

## 2019-11-16 MED ORDER — METHOCARBAMOL 500 MG PO TABS: ORAL_TABLET | ORAL | 2 refills | Status: DC

## 2019-11-16 MED ORDER — HYDROCODONE-ACETAMINOPHEN 10-325 MG PO TABS: 1.0000 | ORAL_TABLET | Freq: Three times a day (TID) | ORAL | 0 refills | Status: DC | PRN
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
## 2019-12-12 ENCOUNTER — Telehealth: Payer: Self-pay | Admitting: *Deleted

## 2019-12-12 NOTE — Telephone Encounter (Signed)
Received call from [place], [place] with [facility] [phone], Ext [phone].  Requested VO to extend [facility] SN services 1x 19months for CAP recertification.   VO given.

## 2019-12-14 ENCOUNTER — Other Ambulatory Visit: Payer: Self-pay | Admitting: Family Medicine

## 2019-12-15 ENCOUNTER — Encounter: Payer: Medicaid Other | Admitting: Registered Nurse

## 2019-12-27 ENCOUNTER — Other Ambulatory Visit: Payer: Self-pay

## 2019-12-27 ENCOUNTER — Encounter: Payer: Medicaid Other | Attending: Physical Medicine and Rehabilitation | Admitting: Registered Nurse

## 2019-12-27 ENCOUNTER — Encounter: Payer: Self-pay | Admitting: Registered Nurse

## 2019-12-27 VITALS — BP 127/71 | HR 94 | Temp 97.7°F | Ht 65.0 in | Wt 301.0 lb

## 2019-12-27 DIAGNOSIS — IMO0002 Reserved for concepts with insufficient information to code with codable children: Secondary | ICD-10-CM

## 2019-12-27 DIAGNOSIS — E039 Hypothyroidism, unspecified: Secondary | ICD-10-CM | POA: Diagnosis not present

## 2019-12-27 DIAGNOSIS — J449 Chronic obstructive pulmonary disease, unspecified: Secondary | ICD-10-CM | POA: Diagnosis not present

## 2019-12-27 DIAGNOSIS — G894 Chronic pain syndrome: Secondary | ICD-10-CM | POA: Diagnosis present

## 2019-12-27 DIAGNOSIS — M7061 Trochanteric bursitis, right hip: Secondary | ICD-10-CM

## 2019-12-27 DIAGNOSIS — G8929 Other chronic pain: Secondary | ICD-10-CM | POA: Diagnosis present

## 2019-12-27 DIAGNOSIS — F17210 Nicotine dependence, cigarettes, uncomplicated: Secondary | ICD-10-CM | POA: Insufficient documentation

## 2019-12-27 DIAGNOSIS — M546 Pain in thoracic spine: Secondary | ICD-10-CM

## 2019-12-27 DIAGNOSIS — E119 Type 2 diabetes mellitus without complications: Secondary | ICD-10-CM | POA: Insufficient documentation

## 2019-12-27 DIAGNOSIS — E785 Hyperlipidemia, unspecified: Secondary | ICD-10-CM | POA: Diagnosis not present

## 2019-12-27 DIAGNOSIS — M25512 Pain in left shoulder: Secondary | ICD-10-CM | POA: Insufficient documentation

## 2019-12-27 DIAGNOSIS — K219 Gastro-esophageal reflux disease without esophagitis: Secondary | ICD-10-CM | POA: Insufficient documentation

## 2019-12-27 DIAGNOSIS — M25551 Pain in right hip: Secondary | ICD-10-CM | POA: Insufficient documentation

## 2019-12-27 DIAGNOSIS — M47816 Spondylosis without myelopathy or radiculopathy, lumbar region: Secondary | ICD-10-CM | POA: Diagnosis not present

## 2019-12-27 DIAGNOSIS — M75101 Unspecified rotator cuff tear or rupture of right shoulder, not specified as traumatic: Secondary | ICD-10-CM

## 2019-12-27 DIAGNOSIS — M961 Postlaminectomy syndrome, not elsewhere classified: Secondary | ICD-10-CM | POA: Diagnosis not present

## 2019-12-27 DIAGNOSIS — M810 Age-related osteoporosis without current pathological fracture: Secondary | ICD-10-CM | POA: Diagnosis not present

## 2019-12-27 DIAGNOSIS — M47814 Spondylosis without myelopathy or radiculopathy, thoracic region: Secondary | ICD-10-CM | POA: Insufficient documentation

## 2019-12-27 DIAGNOSIS — M79604 Pain in right leg: Secondary | ICD-10-CM | POA: Diagnosis not present

## 2019-12-27 DIAGNOSIS — Z5181 Encounter for therapeutic drug level monitoring: Secondary | ICD-10-CM | POA: Insufficient documentation

## 2019-12-27 DIAGNOSIS — Z79899 Other long term (current) drug therapy: Secondary | ICD-10-CM | POA: Diagnosis present

## 2019-12-27 DIAGNOSIS — M7062 Trochanteric bursitis, left hip: Secondary | ICD-10-CM

## 2019-12-27 DIAGNOSIS — M7542 Impingement syndrome of left shoulder: Secondary | ICD-10-CM

## 2019-12-27 DIAGNOSIS — M7918 Myalgia, other site: Secondary | ICD-10-CM

## 2019-12-27 DIAGNOSIS — M5416 Radiculopathy, lumbar region: Secondary | ICD-10-CM

## 2019-12-27 MED ORDER — HYDROCODONE-ACETAMINOPHEN 10-325 MG PO TABS: 1.0000 | ORAL_TABLET | Freq: Three times a day (TID) | ORAL | 0 refills | Status: DC | PRN
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
## 2019-12-30 LAB — DRUG TOX MONITOR 1 W/CONF, ORAL FLD
Amphetamines: NEGATIVE ng/mL (ref <10)
Barbiturates: NEGATIVE ng/mL (ref <10)
Benzodiazepines: NEGATIVE ng/mL (ref <0.50)
Buprenorphine: NEGATIVE ng/mL (ref <0.10)
Cocaine: NEGATIVE ng/mL (ref <5.0)
Codeine: NEGATIVE ng/mL (ref <2.5)
Cotinine: 128.6 ng/mL — ABNORMAL HIGH (ref <5.0)
Dihydrocodeine: 44.9 ng/mL — ABNORMAL HIGH (ref <2.5)
Fentanyl: NEGATIVE ng/mL (ref <0.10)
Heroin Metabolite: NEGATIVE ng/mL (ref <1.0)
Hydrocodone: >250 ng/mL — ABNORMAL HIGH (ref <2.5)
Hydromorphone: NEGATIVE ng/mL (ref <2.5)
MARIJUANA: NEGATIVE ng/mL (ref <2.5)
MDMA: NEGATIVE ng/mL (ref <10)
Meprobamate: NEGATIVE ng/mL (ref <2.5)
Methadone: NEGATIVE ng/mL (ref <5.0)
Morphine: NEGATIVE ng/mL (ref <2.5)
Nicotine Metabolite: POSITIVE ng/mL — AB (ref <5.0)
Norhydrocodone: 14.4 ng/mL — ABNORMAL HIGH (ref <2.5)
Noroxycodone: NEGATIVE ng/mL (ref <2.5)
Opiates: POSITIVE ng/mL — AB (ref <2.5)
Oxycodone: NEGATIVE ng/mL (ref <2.5)
Oxymorphone: NEGATIVE ng/mL (ref <2.5)
Phencyclidine: NEGATIVE ng/mL (ref <10)
Tapentadol: NEGATIVE ng/mL (ref <5.0)
Tramadol: NEGATIVE ng/mL (ref <5.0)
Zolpidem: NEGATIVE ng/mL (ref <5.0)

## 2019-12-30 LAB — DRUG TOX ALC METAB W/CON, ORAL FLD: Alcohol Metabolite: NEGATIVE ng/mL (ref <25)

## 2020-01-02 ENCOUNTER — Telehealth: Payer: Self-pay | Admitting: *Deleted

## 2020-01-02 NOTE — Telephone Encounter (Signed)
Oral swab drug screen was consistent for prescribed medications.  ?

## 2020-01-03 ENCOUNTER — Ambulatory Visit: Payer: Medicaid Other | Admitting: Urology
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
## 2020-01-12 ENCOUNTER — Other Ambulatory Visit: Payer: Self-pay | Admitting: Family Medicine

## 2020-01-13 ENCOUNTER — Telehealth: Payer: Self-pay | Admitting: Family Medicine

## 2020-01-13 ENCOUNTER — Encounter: Payer: Medicaid Other | Admitting: Family Medicine

## 2020-01-13 NOTE — Telephone Encounter (Signed)
I recommend she call her insurance and find out who is in her network I dont know anyone

## 2020-01-13 NOTE — Telephone Encounter (Signed)
Call placed to patient and patient made aware.  

## 2020-01-13 NOTE — Telephone Encounter (Signed)
Patient is moving to [place]  Would like to know if you have any recommendations for primary care

## 2020-01-13 NOTE — Telephone Encounter (Signed)
MD please advise

## 2020-01-18 ENCOUNTER — Encounter: Payer: Medicaid Other | Admitting: Physical Medicine and Rehabilitation

## 2020-01-25 ENCOUNTER — Encounter: Payer: Self-pay | Admitting: Registered Nurse

## 2020-01-25 ENCOUNTER — Other Ambulatory Visit: Payer: Self-pay

## 2020-01-25 ENCOUNTER — Encounter: Payer: Medicaid Other | Attending: Physical Medicine and Rehabilitation | Admitting: Registered Nurse

## 2020-01-25 VITALS — BP 114/59 | HR 106

## 2020-01-25 DIAGNOSIS — M79604 Pain in right leg: Secondary | ICD-10-CM | POA: Insufficient documentation

## 2020-01-25 DIAGNOSIS — Z5181 Encounter for therapeutic drug level monitoring: Secondary | ICD-10-CM | POA: Diagnosis present

## 2020-01-25 DIAGNOSIS — M7918 Myalgia, other site: Secondary | ICD-10-CM

## 2020-01-25 DIAGNOSIS — J449 Chronic obstructive pulmonary disease, unspecified: Secondary | ICD-10-CM | POA: Diagnosis not present

## 2020-01-25 DIAGNOSIS — M810 Age-related osteoporosis without current pathological fracture: Secondary | ICD-10-CM | POA: Insufficient documentation

## 2020-01-25 DIAGNOSIS — M25551 Pain in right hip: Secondary | ICD-10-CM | POA: Diagnosis not present

## 2020-01-25 DIAGNOSIS — M47814 Spondylosis without myelopathy or radiculopathy, thoracic region: Secondary | ICD-10-CM | POA: Diagnosis not present

## 2020-01-25 DIAGNOSIS — G8929 Other chronic pain: Secondary | ICD-10-CM | POA: Diagnosis present

## 2020-01-25 DIAGNOSIS — E785 Hyperlipidemia, unspecified: Secondary | ICD-10-CM | POA: Insufficient documentation

## 2020-01-25 DIAGNOSIS — M47816 Spondylosis without myelopathy or radiculopathy, lumbar region: Secondary | ICD-10-CM | POA: Diagnosis not present

## 2020-01-25 DIAGNOSIS — M5416 Radiculopathy, lumbar region: Secondary | ICD-10-CM

## 2020-01-25 DIAGNOSIS — M75101 Unspecified rotator cuff tear or rupture of right shoulder, not specified as traumatic: Secondary | ICD-10-CM

## 2020-01-25 DIAGNOSIS — M961 Postlaminectomy syndrome, not elsewhere classified: Secondary | ICD-10-CM

## 2020-01-25 DIAGNOSIS — M7061 Trochanteric bursitis, right hip: Secondary | ICD-10-CM

## 2020-01-25 DIAGNOSIS — G894 Chronic pain syndrome: Secondary | ICD-10-CM | POA: Insufficient documentation

## 2020-01-25 DIAGNOSIS — M546 Pain in thoracic spine: Secondary | ICD-10-CM

## 2020-01-25 DIAGNOSIS — K219 Gastro-esophageal reflux disease without esophagitis: Secondary | ICD-10-CM | POA: Insufficient documentation

## 2020-01-25 DIAGNOSIS — F17210 Nicotine dependence, cigarettes, uncomplicated: Secondary | ICD-10-CM | POA: Diagnosis not present

## 2020-01-25 DIAGNOSIS — E119 Type 2 diabetes mellitus without complications: Secondary | ICD-10-CM | POA: Diagnosis not present

## 2020-01-25 DIAGNOSIS — M7542 Impingement syndrome of left shoulder: Secondary | ICD-10-CM | POA: Diagnosis not present

## 2020-01-25 DIAGNOSIS — M25512 Pain in left shoulder: Secondary | ICD-10-CM | POA: Diagnosis not present

## 2020-01-25 DIAGNOSIS — Z79899 Other long term (current) drug therapy: Secondary | ICD-10-CM | POA: Insufficient documentation

## 2020-01-25 DIAGNOSIS — E039 Hypothyroidism, unspecified: Secondary | ICD-10-CM | POA: Diagnosis not present

## 2020-01-25 DIAGNOSIS — M7062 Trochanteric bursitis, left hip: Secondary | ICD-10-CM

## 2020-01-25 DIAGNOSIS — IMO0002 Reserved for concepts with insufficient information to code with codable children: Secondary | ICD-10-CM

## 2020-01-25 MED ORDER — HYDROCODONE-ACETAMINOPHEN 10-325 MG PO TABS: 1.0000 | ORAL_TABLET | Freq: Three times a day (TID) | ORAL | 0 refills | Status: DC | PRN
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
## 2020-01-31 ENCOUNTER — Ambulatory Visit: Payer: Medicaid Other | Admitting: Physical Medicine and Rehabilitation

## 2020-02-13 ENCOUNTER — Other Ambulatory Visit: Payer: Self-pay | Admitting: Family Medicine

## 2020-02-13 ENCOUNTER — Other Ambulatory Visit: Payer: Self-pay | Admitting: Nurse Practitioner

## 2020-02-13 ENCOUNTER — Other Ambulatory Visit: Payer: Self-pay | Admitting: Registered Nurse

## 2020-02-14 ENCOUNTER — Telehealth: Payer: Self-pay | Admitting: *Deleted

## 2020-02-14 NOTE — Telephone Encounter (Signed)
Noted, okay to switch brands Needs Labs/OV if still in town in 6-8 weeks

## 2020-02-14 NOTE — Telephone Encounter (Signed)
Received call from [facility].   Reports that patient has been receiving Mylan  Brand Levothyroxine from [place]. Advised that they currently have [place].  Requested VO to change brand to Alvagen. VO given. Advised that patient will require F/U with labs in 6-8 weeks to determine if she continues to be within therapeutic range with either [place] or new PCP.

## 2020-02-21 ENCOUNTER — Ambulatory Visit: Payer: Medicaid Other | Admitting: Gastroenterology

## 2020-02-24 ENCOUNTER — Encounter: Payer: Medicaid Other | Admitting: Registered Nurse

## 2020-02-28 ENCOUNTER — Encounter: Payer: Medicaid Other | Attending: Physical Medicine and Rehabilitation | Admitting: Registered Nurse

## 2020-02-28 ENCOUNTER — Encounter: Payer: Self-pay | Admitting: Registered Nurse

## 2020-02-28 ENCOUNTER — Other Ambulatory Visit: Payer: Self-pay

## 2020-02-28 VITALS — Ht 65.5 in | Wt 300.0 lb

## 2020-02-28 DIAGNOSIS — M7918 Myalgia, other site: Secondary | ICD-10-CM

## 2020-02-28 DIAGNOSIS — J449 Chronic obstructive pulmonary disease, unspecified: Secondary | ICD-10-CM | POA: Insufficient documentation

## 2020-02-28 DIAGNOSIS — M79604 Pain in right leg: Secondary | ICD-10-CM | POA: Insufficient documentation

## 2020-02-28 DIAGNOSIS — M7062 Trochanteric bursitis, left hip: Secondary | ICD-10-CM

## 2020-02-28 DIAGNOSIS — M25512 Pain in left shoulder: Secondary | ICD-10-CM | POA: Insufficient documentation

## 2020-02-28 DIAGNOSIS — K219 Gastro-esophageal reflux disease without esophagitis: Secondary | ICD-10-CM | POA: Insufficient documentation

## 2020-02-28 DIAGNOSIS — M7542 Impingement syndrome of left shoulder: Secondary | ICD-10-CM

## 2020-02-28 DIAGNOSIS — M5412 Radiculopathy, cervical region: Secondary | ICD-10-CM

## 2020-02-28 DIAGNOSIS — M47816 Spondylosis without myelopathy or radiculopathy, lumbar region: Secondary | ICD-10-CM | POA: Insufficient documentation

## 2020-02-28 DIAGNOSIS — G8929 Other chronic pain: Secondary | ICD-10-CM

## 2020-02-28 DIAGNOSIS — M542 Cervicalgia: Secondary | ICD-10-CM

## 2020-02-28 DIAGNOSIS — M25551 Pain in right hip: Secondary | ICD-10-CM | POA: Insufficient documentation

## 2020-02-28 DIAGNOSIS — M5416 Radiculopathy, lumbar region: Secondary | ICD-10-CM

## 2020-02-28 DIAGNOSIS — Z5181 Encounter for therapeutic drug level monitoring: Secondary | ICD-10-CM | POA: Insufficient documentation

## 2020-02-28 DIAGNOSIS — E039 Hypothyroidism, unspecified: Secondary | ICD-10-CM | POA: Insufficient documentation

## 2020-02-28 DIAGNOSIS — F17210 Nicotine dependence, cigarettes, uncomplicated: Secondary | ICD-10-CM | POA: Insufficient documentation

## 2020-02-28 DIAGNOSIS — M75101 Unspecified rotator cuff tear or rupture of right shoulder, not specified as traumatic: Secondary | ICD-10-CM | POA: Diagnosis not present

## 2020-02-28 DIAGNOSIS — M47814 Spondylosis without myelopathy or radiculopathy, thoracic region: Secondary | ICD-10-CM | POA: Insufficient documentation

## 2020-02-28 DIAGNOSIS — M961 Postlaminectomy syndrome, not elsewhere classified: Secondary | ICD-10-CM

## 2020-02-28 DIAGNOSIS — M546 Pain in thoracic spine: Secondary | ICD-10-CM

## 2020-02-28 DIAGNOSIS — IMO0002 Reserved for concepts with insufficient information to code with codable children: Secondary | ICD-10-CM

## 2020-02-28 DIAGNOSIS — E785 Hyperlipidemia, unspecified: Secondary | ICD-10-CM | POA: Insufficient documentation

## 2020-02-28 DIAGNOSIS — M810 Age-related osteoporosis without current pathological fracture: Secondary | ICD-10-CM | POA: Insufficient documentation

## 2020-02-28 DIAGNOSIS — Z79899 Other long term (current) drug therapy: Secondary | ICD-10-CM | POA: Insufficient documentation

## 2020-02-28 DIAGNOSIS — G894 Chronic pain syndrome: Secondary | ICD-10-CM | POA: Insufficient documentation

## 2020-02-28 DIAGNOSIS — E119 Type 2 diabetes mellitus without complications: Secondary | ICD-10-CM | POA: Insufficient documentation

## 2020-02-28 DIAGNOSIS — M7061 Trochanteric bursitis, right hip: Secondary | ICD-10-CM

## 2020-02-28 MED ORDER — HYDROCODONE-ACETAMINOPHEN 10-325 MG PO TABS: 1.0000 | ORAL_TABLET | Freq: Three times a day (TID) | ORAL | 0 refills | Status: DC | PRN

## 2020-02-28 MED ORDER — METHOCARBAMOL 500 MG PO TABS: ORAL_TABLET | ORAL | 2 refills | Status: DC
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
## 2020-03-12 ENCOUNTER — Other Ambulatory Visit: Payer: Self-pay | Admitting: Family Medicine

## 2020-03-22 ENCOUNTER — Other Ambulatory Visit: Payer: Self-pay | Admitting: Family Medicine

## 2020-03-22 ENCOUNTER — Other Ambulatory Visit: Payer: Self-pay | Admitting: Physical Medicine & Rehabilitation

## 2020-03-27 ENCOUNTER — Encounter: Payer: Medicaid Other | Admitting: Registered Nurse

## 2020-04-03 ENCOUNTER — Other Ambulatory Visit: Payer: Self-pay

## 2020-04-03 ENCOUNTER — Encounter: Payer: Self-pay | Admitting: Registered Nurse

## 2020-04-03 ENCOUNTER — Encounter: Payer: Medicaid Other | Attending: Physical Medicine and Rehabilitation | Admitting: Registered Nurse

## 2020-04-03 VITALS — BP 152/62 | HR 98 | Temp 98.3°F

## 2020-04-03 DIAGNOSIS — J449 Chronic obstructive pulmonary disease, unspecified: Secondary | ICD-10-CM | POA: Insufficient documentation

## 2020-04-03 DIAGNOSIS — M79604 Pain in right leg: Secondary | ICD-10-CM | POA: Diagnosis not present

## 2020-04-03 DIAGNOSIS — Z5181 Encounter for therapeutic drug level monitoring: Secondary | ICD-10-CM | POA: Insufficient documentation

## 2020-04-03 DIAGNOSIS — M546 Pain in thoracic spine: Secondary | ICD-10-CM

## 2020-04-03 DIAGNOSIS — G8929 Other chronic pain: Secondary | ICD-10-CM | POA: Insufficient documentation

## 2020-04-03 DIAGNOSIS — K219 Gastro-esophageal reflux disease without esophagitis: Secondary | ICD-10-CM | POA: Diagnosis not present

## 2020-04-03 DIAGNOSIS — M961 Postlaminectomy syndrome, not elsewhere classified: Secondary | ICD-10-CM | POA: Diagnosis not present

## 2020-04-03 DIAGNOSIS — F17210 Nicotine dependence, cigarettes, uncomplicated: Secondary | ICD-10-CM | POA: Insufficient documentation

## 2020-04-03 DIAGNOSIS — M810 Age-related osteoporosis without current pathological fracture: Secondary | ICD-10-CM | POA: Diagnosis not present

## 2020-04-03 DIAGNOSIS — E785 Hyperlipidemia, unspecified: Secondary | ICD-10-CM | POA: Diagnosis not present

## 2020-04-03 DIAGNOSIS — M25551 Pain in right hip: Secondary | ICD-10-CM | POA: Insufficient documentation

## 2020-04-03 DIAGNOSIS — M25512 Pain in left shoulder: Secondary | ICD-10-CM | POA: Diagnosis not present

## 2020-04-03 DIAGNOSIS — E039 Hypothyroidism, unspecified: Secondary | ICD-10-CM | POA: Diagnosis not present

## 2020-04-03 DIAGNOSIS — M7061 Trochanteric bursitis, right hip: Secondary | ICD-10-CM

## 2020-04-03 DIAGNOSIS — M542 Cervicalgia: Secondary | ICD-10-CM

## 2020-04-03 DIAGNOSIS — M47814 Spondylosis without myelopathy or radiculopathy, thoracic region: Secondary | ICD-10-CM | POA: Diagnosis not present

## 2020-04-03 DIAGNOSIS — M5416 Radiculopathy, lumbar region: Secondary | ICD-10-CM

## 2020-04-03 DIAGNOSIS — Z79899 Other long term (current) drug therapy: Secondary | ICD-10-CM | POA: Diagnosis present

## 2020-04-03 DIAGNOSIS — E119 Type 2 diabetes mellitus without complications: Secondary | ICD-10-CM | POA: Insufficient documentation

## 2020-04-03 DIAGNOSIS — M7542 Impingement syndrome of left shoulder: Secondary | ICD-10-CM | POA: Diagnosis not present

## 2020-04-03 DIAGNOSIS — IMO0002 Reserved for concepts with insufficient information to code with codable children: Secondary | ICD-10-CM

## 2020-04-03 DIAGNOSIS — G894 Chronic pain syndrome: Secondary | ICD-10-CM | POA: Insufficient documentation

## 2020-04-03 DIAGNOSIS — M75101 Unspecified rotator cuff tear or rupture of right shoulder, not specified as traumatic: Secondary | ICD-10-CM

## 2020-04-03 DIAGNOSIS — M5412 Radiculopathy, cervical region: Secondary | ICD-10-CM

## 2020-04-03 DIAGNOSIS — M7062 Trochanteric bursitis, left hip: Secondary | ICD-10-CM

## 2020-04-03 DIAGNOSIS — M47816 Spondylosis without myelopathy or radiculopathy, lumbar region: Secondary | ICD-10-CM | POA: Insufficient documentation

## 2020-04-03 DIAGNOSIS — M7918 Myalgia, other site: Secondary | ICD-10-CM

## 2020-04-03 MED ORDER — HYDROCODONE-ACETAMINOPHEN 10-325 MG PO TABS: 1.0000 | ORAL_TABLET | Freq: Three times a day (TID) | ORAL | 0 refills | Status: DC | PRN
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
## 2020-04-11 ENCOUNTER — Other Ambulatory Visit: Payer: Self-pay | Admitting: Family Medicine

## 2020-04-18 ENCOUNTER — Other Ambulatory Visit: Payer: Self-pay

## 2020-04-19 MED ORDER — ONDANSETRON HCL 4 MG PO TABS: ORAL_TABLET | ORAL | 3 refills | Status: DC

## 2020-04-21 ENCOUNTER — Other Ambulatory Visit: Payer: Self-pay | Admitting: Family Medicine

## 2020-05-01 ENCOUNTER — Encounter: Payer: Medicaid Other | Attending: Physical Medicine and Rehabilitation | Admitting: Registered Nurse

## 2020-05-01 ENCOUNTER — Other Ambulatory Visit: Payer: Self-pay

## 2020-05-01 VITALS — BP 126/74 | HR 105 | Temp 98.6°F

## 2020-05-01 DIAGNOSIS — J449 Chronic obstructive pulmonary disease, unspecified: Secondary | ICD-10-CM | POA: Insufficient documentation

## 2020-05-01 DIAGNOSIS — E119 Type 2 diabetes mellitus without complications: Secondary | ICD-10-CM | POA: Diagnosis not present

## 2020-05-01 DIAGNOSIS — K219 Gastro-esophageal reflux disease without esophagitis: Secondary | ICD-10-CM | POA: Diagnosis not present

## 2020-05-01 DIAGNOSIS — Z79891 Long term (current) use of opiate analgesic: Secondary | ICD-10-CM

## 2020-05-01 DIAGNOSIS — M25512 Pain in left shoulder: Secondary | ICD-10-CM | POA: Diagnosis not present

## 2020-05-01 DIAGNOSIS — Z79899 Other long term (current) drug therapy: Secondary | ICD-10-CM | POA: Insufficient documentation

## 2020-05-01 DIAGNOSIS — M47816 Spondylosis without myelopathy or radiculopathy, lumbar region: Secondary | ICD-10-CM | POA: Diagnosis not present

## 2020-05-01 DIAGNOSIS — M75101 Unspecified rotator cuff tear or rupture of right shoulder, not specified as traumatic: Secondary | ICD-10-CM

## 2020-05-01 DIAGNOSIS — F17210 Nicotine dependence, cigarettes, uncomplicated: Secondary | ICD-10-CM | POA: Insufficient documentation

## 2020-05-01 DIAGNOSIS — M542 Cervicalgia: Secondary | ICD-10-CM

## 2020-05-01 DIAGNOSIS — E785 Hyperlipidemia, unspecified: Secondary | ICD-10-CM | POA: Diagnosis not present

## 2020-05-01 DIAGNOSIS — G894 Chronic pain syndrome: Secondary | ICD-10-CM

## 2020-05-01 DIAGNOSIS — G8929 Other chronic pain: Secondary | ICD-10-CM | POA: Diagnosis present

## 2020-05-01 DIAGNOSIS — M810 Age-related osteoporosis without current pathological fracture: Secondary | ICD-10-CM | POA: Diagnosis not present

## 2020-05-01 DIAGNOSIS — M546 Pain in thoracic spine: Secondary | ICD-10-CM

## 2020-05-01 DIAGNOSIS — E039 Hypothyroidism, unspecified: Secondary | ICD-10-CM | POA: Insufficient documentation

## 2020-05-01 DIAGNOSIS — M7542 Impingement syndrome of left shoulder: Secondary | ICD-10-CM

## 2020-05-01 DIAGNOSIS — M25551 Pain in right hip: Secondary | ICD-10-CM | POA: Diagnosis not present

## 2020-05-01 DIAGNOSIS — Z5181 Encounter for therapeutic drug level monitoring: Secondary | ICD-10-CM

## 2020-05-01 DIAGNOSIS — M7062 Trochanteric bursitis, left hip: Secondary | ICD-10-CM

## 2020-05-01 DIAGNOSIS — M5416 Radiculopathy, lumbar region: Secondary | ICD-10-CM

## 2020-05-01 DIAGNOSIS — M47814 Spondylosis without myelopathy or radiculopathy, thoracic region: Secondary | ICD-10-CM | POA: Insufficient documentation

## 2020-05-01 DIAGNOSIS — M7918 Myalgia, other site: Secondary | ICD-10-CM

## 2020-05-01 DIAGNOSIS — M961 Postlaminectomy syndrome, not elsewhere classified: Secondary | ICD-10-CM

## 2020-05-01 DIAGNOSIS — IMO0002 Reserved for concepts with insufficient information to code with codable children: Secondary | ICD-10-CM

## 2020-05-01 DIAGNOSIS — M5412 Radiculopathy, cervical region: Secondary | ICD-10-CM

## 2020-05-01 DIAGNOSIS — M79604 Pain in right leg: Secondary | ICD-10-CM | POA: Insufficient documentation

## 2020-05-01 DIAGNOSIS — M7061 Trochanteric bursitis, right hip: Secondary | ICD-10-CM

## 2020-05-01 MED ORDER — HYDROCODONE-ACETAMINOPHEN 10-325 MG PO TABS: 1.0000 | ORAL_TABLET | Freq: Three times a day (TID) | ORAL | 0 refills | Status: DC | PRN
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
## 2020-05-03 ENCOUNTER — Encounter: Payer: Self-pay | Admitting: Registered Nurse

## 2020-05-10 ENCOUNTER — Telehealth: Payer: Self-pay | Admitting: *Deleted

## 2020-05-10 ENCOUNTER — Other Ambulatory Visit: Payer: Self-pay | Admitting: Family Medicine

## 2020-05-10 NOTE — Telephone Encounter (Signed)
Prior authorization hydrocodone-acetaminophen APPROVED.  05/04/2020-10/31/2020

## 2020-05-18 ENCOUNTER — Other Ambulatory Visit: Payer: Self-pay | Admitting: Physical Medicine & Rehabilitation

## 2020-05-24 ENCOUNTER — Other Ambulatory Visit: Payer: Self-pay | Admitting: *Deleted

## 2020-05-24 MED ORDER — HYOSCYAMINE SULFATE 0.125 MG SL SUBL: SUBLINGUAL_TABLET | SUBLINGUAL | 5 refills | Status: AC

## 2020-05-25 ENCOUNTER — Encounter: Payer: Medicaid Other | Admitting: Physical Medicine & Rehabilitation

## 2020-05-30 ENCOUNTER — Telehealth: Payer: Self-pay

## 2020-05-30 ENCOUNTER — Other Ambulatory Visit: Payer: Self-pay

## 2020-05-30 ENCOUNTER — Encounter: Payer: Medicaid Other | Attending: Physical Medicine and Rehabilitation | Admitting: Registered Nurse

## 2020-05-30 ENCOUNTER — Encounter: Payer: Self-pay | Admitting: Registered Nurse

## 2020-05-30 VITALS — Ht 65.5 in | Wt 300.0 lb

## 2020-05-30 DIAGNOSIS — E119 Type 2 diabetes mellitus without complications: Secondary | ICD-10-CM | POA: Insufficient documentation

## 2020-05-30 DIAGNOSIS — M542 Cervicalgia: Secondary | ICD-10-CM | POA: Diagnosis not present

## 2020-05-30 DIAGNOSIS — M546 Pain in thoracic spine: Secondary | ICD-10-CM

## 2020-05-30 DIAGNOSIS — M47814 Spondylosis without myelopathy or radiculopathy, thoracic region: Secondary | ICD-10-CM | POA: Insufficient documentation

## 2020-05-30 DIAGNOSIS — K219 Gastro-esophageal reflux disease without esophagitis: Secondary | ICD-10-CM | POA: Insufficient documentation

## 2020-05-30 DIAGNOSIS — M25551 Pain in right hip: Secondary | ICD-10-CM | POA: Insufficient documentation

## 2020-05-30 DIAGNOSIS — Z79899 Other long term (current) drug therapy: Secondary | ICD-10-CM | POA: Insufficient documentation

## 2020-05-30 DIAGNOSIS — M5412 Radiculopathy, cervical region: Secondary | ICD-10-CM

## 2020-05-30 DIAGNOSIS — M25512 Pain in left shoulder: Secondary | ICD-10-CM | POA: Insufficient documentation

## 2020-05-30 DIAGNOSIS — M7062 Trochanteric bursitis, left hip: Secondary | ICD-10-CM

## 2020-05-30 DIAGNOSIS — M75101 Unspecified rotator cuff tear or rupture of right shoulder, not specified as traumatic: Secondary | ICD-10-CM | POA: Diagnosis not present

## 2020-05-30 DIAGNOSIS — M961 Postlaminectomy syndrome, not elsewhere classified: Secondary | ICD-10-CM | POA: Diagnosis not present

## 2020-05-30 DIAGNOSIS — M79604 Pain in right leg: Secondary | ICD-10-CM | POA: Insufficient documentation

## 2020-05-30 DIAGNOSIS — J449 Chronic obstructive pulmonary disease, unspecified: Secondary | ICD-10-CM | POA: Insufficient documentation

## 2020-05-30 DIAGNOSIS — G8929 Other chronic pain: Secondary | ICD-10-CM

## 2020-05-30 DIAGNOSIS — M5416 Radiculopathy, lumbar region: Secondary | ICD-10-CM

## 2020-05-30 DIAGNOSIS — M7542 Impingement syndrome of left shoulder: Secondary | ICD-10-CM

## 2020-05-30 DIAGNOSIS — F17210 Nicotine dependence, cigarettes, uncomplicated: Secondary | ICD-10-CM | POA: Insufficient documentation

## 2020-05-30 DIAGNOSIS — M7061 Trochanteric bursitis, right hip: Secondary | ICD-10-CM

## 2020-05-30 DIAGNOSIS — M810 Age-related osteoporosis without current pathological fracture: Secondary | ICD-10-CM | POA: Insufficient documentation

## 2020-05-30 DIAGNOSIS — E039 Hypothyroidism, unspecified: Secondary | ICD-10-CM | POA: Insufficient documentation

## 2020-05-30 DIAGNOSIS — Z5181 Encounter for therapeutic drug level monitoring: Secondary | ICD-10-CM

## 2020-05-30 DIAGNOSIS — IMO0002 Reserved for concepts with insufficient information to code with codable children: Secondary | ICD-10-CM

## 2020-05-30 DIAGNOSIS — Z79891 Long term (current) use of opiate analgesic: Secondary | ICD-10-CM

## 2020-05-30 DIAGNOSIS — M47816 Spondylosis without myelopathy or radiculopathy, lumbar region: Secondary | ICD-10-CM | POA: Insufficient documentation

## 2020-05-30 DIAGNOSIS — G894 Chronic pain syndrome: Secondary | ICD-10-CM

## 2020-05-30 DIAGNOSIS — E785 Hyperlipidemia, unspecified: Secondary | ICD-10-CM | POA: Insufficient documentation

## 2020-05-30 MED ORDER — HYDROCODONE-ACETAMINOPHEN 10-325 MG PO TABS: 1.0000 | ORAL_TABLET | Freq: Three times a day (TID) | ORAL | 0 refills | Status: DC | PRN

## 2020-05-30 MED ORDER — GABAPENTIN 300 MG PO CAPS: ORAL_CAPSULE | ORAL | 3 refills | Status: AC

## 2020-05-30 MED ORDER — METHOCARBAMOL 500 MG PO TABS: ORAL_TABLET | ORAL | 3 refills | Status: AC

## 2020-05-30 NOTE — Telephone Encounter (Signed)
Patient notified

## 2020-05-30 NOTE — Telephone Encounter (Signed)
Patient called requesting to fill Hydrocodone today instead of 06/04/2020 while she has transportation.

## 2020-05-30 NOTE — Telephone Encounter (Signed)
Pharmacy called to confirm the Hydrocodone 10/325 release date. Per pharmacy the release date should be 06/01/20. If 07/01/20 the patient will be out of pain medication for one month.  Please advise. Call back ph [phone]. Thank you
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
## 2020-05-30 NOTE — Telephone Encounter (Signed)
Her Last prescription was filled on 05/04/2020, she can't get her prescription today, too early ( 5 days early).

## 2020-05-30 NOTE — Telephone Encounter (Signed)
[facility]

## 2020-06-09 ENCOUNTER — Other Ambulatory Visit: Payer: Self-pay | Admitting: Family Medicine

## 2020-06-28 ENCOUNTER — Ambulatory Visit: Payer: Medicaid Other | Admitting: Physical Medicine & Rehabilitation

## 2020-06-28 ENCOUNTER — Encounter: Payer: Medicaid Other | Attending: Physical Medicine and Rehabilitation | Admitting: Registered Nurse

## 2020-06-28 ENCOUNTER — Other Ambulatory Visit: Payer: Self-pay

## 2020-06-28 ENCOUNTER — Encounter: Payer: Self-pay | Admitting: Registered Nurse

## 2020-06-28 DIAGNOSIS — M961 Postlaminectomy syndrome, not elsewhere classified: Secondary | ICD-10-CM | POA: Diagnosis not present

## 2020-06-28 DIAGNOSIS — M7542 Impingement syndrome of left shoulder: Secondary | ICD-10-CM | POA: Insufficient documentation

## 2020-06-28 DIAGNOSIS — Z79891 Long term (current) use of opiate analgesic: Secondary | ICD-10-CM | POA: Insufficient documentation

## 2020-06-28 DIAGNOSIS — M542 Cervicalgia: Secondary | ICD-10-CM

## 2020-06-28 DIAGNOSIS — IMO0002 Reserved for concepts with insufficient information to code with codable children: Secondary | ICD-10-CM

## 2020-06-28 DIAGNOSIS — M7062 Trochanteric bursitis, left hip: Secondary | ICD-10-CM | POA: Insufficient documentation

## 2020-06-28 DIAGNOSIS — G894 Chronic pain syndrome: Secondary | ICD-10-CM | POA: Insufficient documentation

## 2020-06-28 DIAGNOSIS — M75101 Unspecified rotator cuff tear or rupture of right shoulder, not specified as traumatic: Secondary | ICD-10-CM | POA: Insufficient documentation

## 2020-06-28 DIAGNOSIS — Z5181 Encounter for therapeutic drug level monitoring: Secondary | ICD-10-CM | POA: Insufficient documentation

## 2020-06-28 DIAGNOSIS — M5416 Radiculopathy, lumbar region: Secondary | ICD-10-CM | POA: Insufficient documentation

## 2020-06-28 DIAGNOSIS — M7061 Trochanteric bursitis, right hip: Secondary | ICD-10-CM | POA: Insufficient documentation

## 2020-06-28 MED ORDER — HYDROCODONE-ACETAMINOPHEN 10-325 MG PO TABS
1.0000 | ORAL_TABLET | Freq: Three times a day (TID) | ORAL | 0 refills | Status: DC | PRN
Start: 2020-06-28 — End: 2020-07-26

## 2020-06-28 MED ORDER — DICLOFENAC SODIUM 1 % EX GEL: CUTANEOUS | 1 refills | Status: AC
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
## 2020-07-06 ENCOUNTER — Other Ambulatory Visit: Payer: Self-pay | Admitting: Family Medicine

## 2020-07-06 ENCOUNTER — Telehealth: Payer: Self-pay | Admitting: Registered Nurse

## 2020-07-06 DIAGNOSIS — E1165 Type 2 diabetes mellitus with hyperglycemia: Secondary | ICD-10-CM

## 2020-07-06 DIAGNOSIS — IMO0002 Reserved for concepts with insufficient information to code with codable children: Secondary | ICD-10-CM

## 2020-07-06 NOTE — Telephone Encounter (Signed)
Spoke with Dr. Letta Pate regarding [name] transportation issue. She lives in [place] and doesn't have a transportation company who will bring her to our office. She has a schedule appointment with a new PCP in December. We will allow [name] to have My-Chart Video appointments, hopefully she will have a new pain management doctor by January. She realizes this will not be long term, she is very appreciative and verbalizes understanding.

## 2020-07-26 ENCOUNTER — Other Ambulatory Visit: Payer: Self-pay

## 2020-07-26 ENCOUNTER — Encounter: Payer: Medicaid Other | Attending: Physical Medicine and Rehabilitation | Admitting: Registered Nurse

## 2020-07-26 ENCOUNTER — Encounter: Payer: Self-pay | Admitting: Registered Nurse

## 2020-07-26 VITALS — Ht 65.5 in | Wt 271.0 lb

## 2020-07-26 DIAGNOSIS — M7542 Impingement syndrome of left shoulder: Secondary | ICD-10-CM | POA: Diagnosis not present

## 2020-07-26 DIAGNOSIS — M546 Pain in thoracic spine: Secondary | ICD-10-CM | POA: Diagnosis not present

## 2020-07-26 DIAGNOSIS — M961 Postlaminectomy syndrome, not elsewhere classified: Secondary | ICD-10-CM | POA: Diagnosis not present

## 2020-07-26 DIAGNOSIS — M5416 Radiculopathy, lumbar region: Secondary | ICD-10-CM | POA: Insufficient documentation

## 2020-07-26 DIAGNOSIS — Z79891 Long term (current) use of opiate analgesic: Secondary | ICD-10-CM | POA: Insufficient documentation

## 2020-07-26 DIAGNOSIS — G894 Chronic pain syndrome: Secondary | ICD-10-CM

## 2020-07-26 DIAGNOSIS — Z5181 Encounter for therapeutic drug level monitoring: Secondary | ICD-10-CM | POA: Insufficient documentation

## 2020-07-26 DIAGNOSIS — G8929 Other chronic pain: Secondary | ICD-10-CM | POA: Insufficient documentation

## 2020-07-26 DIAGNOSIS — M7061 Trochanteric bursitis, right hip: Secondary | ICD-10-CM | POA: Insufficient documentation

## 2020-07-26 MED ORDER — HYDROCODONE-ACETAMINOPHEN 10-325 MG PO TABS
1.0000 | ORAL_TABLET | Freq: Three times a day (TID) | ORAL | 0 refills | Status: DC | PRN
Start: 2020-07-26 — End: 2020-08-20
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
## 2020-08-09 ENCOUNTER — Other Ambulatory Visit: Payer: Self-pay | Admitting: Family Medicine

## 2020-08-13 ENCOUNTER — Telehealth: Payer: Self-pay

## 2020-08-13 NOTE — Telephone Encounter (Signed)
Refill request received from [facility] for Zofran 4 mg one tab po q 8 hours as needed for nausea or vomiting.

## 2020-08-15 ENCOUNTER — Other Ambulatory Visit: Payer: Self-pay | Admitting: Gastroenterology

## 2020-08-15 DIAGNOSIS — R110 Nausea: Secondary | ICD-10-CM

## 2020-08-15 MED ORDER — ONDANSETRON HCL 4 MG PO TABS: ORAL_TABLET | ORAL | 0 refills | Status: DC

## 2020-08-15 NOTE — Telephone Encounter (Signed)
I am sending in a limited supply. Doesn't look like she was having any trouble with nausea at her last visit in Feb 2021. She cancelled her follow-up with Neil Crouch in June. Recommend she reschedule to see Magda Paganini in the coming months for follow-up.

## 2020-08-16 NOTE — Telephone Encounter (Signed)
FYI, Spoke with pt. Pt missed apt due to moving to another part of [place]. Pt will possible seeing another gastro doctor when she see's her new PCP.

## 2020-08-16 NOTE — Telephone Encounter (Signed)
For [facility].

## 2020-08-16 NOTE — Telephone Encounter (Signed)
Lmom, waiting on a return call.  

## 2020-08-17 NOTE — Telephone Encounter (Signed)
Noted. Please have her call us to let us know if she is establishing with another GI provider.

## 2020-08-17 NOTE — Telephone Encounter (Signed)
Noted. Pt will contact our office when established.

## 2020-08-20 ENCOUNTER — Encounter: Payer: Medicaid Other | Attending: Physical Medicine and Rehabilitation | Admitting: Registered Nurse

## 2020-08-20 ENCOUNTER — Telehealth: Payer: Self-pay

## 2020-08-20 ENCOUNTER — Other Ambulatory Visit: Payer: Self-pay

## 2020-08-20 ENCOUNTER — Encounter: Payer: Self-pay | Admitting: Registered Nurse

## 2020-08-20 VITALS — Ht 65.5 in

## 2020-08-20 DIAGNOSIS — M7542 Impingement syndrome of left shoulder: Secondary | ICD-10-CM | POA: Diagnosis not present

## 2020-08-20 DIAGNOSIS — Z79891 Long term (current) use of opiate analgesic: Secondary | ICD-10-CM | POA: Insufficient documentation

## 2020-08-20 DIAGNOSIS — G894 Chronic pain syndrome: Secondary | ICD-10-CM | POA: Insufficient documentation

## 2020-08-20 DIAGNOSIS — M961 Postlaminectomy syndrome, not elsewhere classified: Secondary | ICD-10-CM | POA: Insufficient documentation

## 2020-08-20 DIAGNOSIS — M546 Pain in thoracic spine: Secondary | ICD-10-CM | POA: Diagnosis not present

## 2020-08-20 DIAGNOSIS — G8929 Other chronic pain: Secondary | ICD-10-CM | POA: Insufficient documentation

## 2020-08-20 DIAGNOSIS — Z5181 Encounter for therapeutic drug level monitoring: Secondary | ICD-10-CM | POA: Insufficient documentation

## 2020-08-20 DIAGNOSIS — M5416 Radiculopathy, lumbar region: Secondary | ICD-10-CM | POA: Insufficient documentation

## 2020-08-20 DIAGNOSIS — M7061 Trochanteric bursitis, right hip: Secondary | ICD-10-CM | POA: Insufficient documentation

## 2020-08-20 MED ORDER — HYDROCODONE-ACETAMINOPHEN 10-325 MG PO TABS
1.0000 | ORAL_TABLET | Freq: Three times a day (TID) | ORAL | 0 refills | Status: DC | PRN
Start: 2020-08-20 — End: 2020-09-18
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
## 2020-08-20 NOTE — Telephone Encounter (Signed)
Patient called and states she is scheduled for today - I see no appt.

## 2020-09-05 ENCOUNTER — Other Ambulatory Visit: Payer: Self-pay | Admitting: Family Medicine

## 2020-09-05 ENCOUNTER — Other Ambulatory Visit: Payer: Self-pay

## 2020-09-05 DIAGNOSIS — IMO0002 Reserved for concepts with insufficient information to code with codable children: Secondary | ICD-10-CM

## 2020-09-05 DIAGNOSIS — R110 Nausea: Secondary | ICD-10-CM

## 2020-09-06 MED ORDER — ONDANSETRON HCL 4 MG PO TABS: ORAL_TABLET | ORAL | 1 refills | Status: AC

## 2020-09-18 ENCOUNTER — Encounter: Payer: Medicaid Other | Attending: Physical Medicine and Rehabilitation | Admitting: Registered Nurse

## 2020-09-18 ENCOUNTER — Other Ambulatory Visit: Payer: Self-pay

## 2020-09-18 ENCOUNTER — Encounter: Payer: Self-pay | Admitting: Registered Nurse

## 2020-09-18 DIAGNOSIS — M5416 Radiculopathy, lumbar region: Secondary | ICD-10-CM | POA: Diagnosis not present

## 2020-09-18 DIAGNOSIS — M7542 Impingement syndrome of left shoulder: Secondary | ICD-10-CM | POA: Diagnosis not present

## 2020-09-18 DIAGNOSIS — G8929 Other chronic pain: Secondary | ICD-10-CM | POA: Insufficient documentation

## 2020-09-18 DIAGNOSIS — G894 Chronic pain syndrome: Secondary | ICD-10-CM | POA: Insufficient documentation

## 2020-09-18 DIAGNOSIS — M961 Postlaminectomy syndrome, not elsewhere classified: Secondary | ICD-10-CM | POA: Insufficient documentation

## 2020-09-18 DIAGNOSIS — Z79891 Long term (current) use of opiate analgesic: Secondary | ICD-10-CM | POA: Insufficient documentation

## 2020-09-18 DIAGNOSIS — M7061 Trochanteric bursitis, right hip: Secondary | ICD-10-CM | POA: Insufficient documentation

## 2020-09-18 DIAGNOSIS — M546 Pain in thoracic spine: Secondary | ICD-10-CM | POA: Diagnosis not present

## 2020-09-18 DIAGNOSIS — Z5181 Encounter for therapeutic drug level monitoring: Secondary | ICD-10-CM | POA: Insufficient documentation

## 2020-09-18 MED ORDER — HYDROCODONE-ACETAMINOPHEN 10-325 MG PO TABS: 1.0000 | ORAL_TABLET | Freq: Three times a day (TID) | ORAL | 0 refills | Status: AC | PRN
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
## 2020-10-10 ENCOUNTER — Other Ambulatory Visit: Payer: Self-pay | Admitting: Family Medicine

## 2020-10-30 ENCOUNTER — Other Ambulatory Visit: Payer: Self-pay | Admitting: Family Medicine

## 2020-10-30 DIAGNOSIS — E1165 Type 2 diabetes mellitus with hyperglycemia: Secondary | ICD-10-CM

## 2020-10-30 DIAGNOSIS — IMO0002 Reserved for concepts with insufficient information to code with codable children: Secondary | ICD-10-CM

## 2020-11-02 ENCOUNTER — Other Ambulatory Visit: Payer: Self-pay

## 2020-11-02 DIAGNOSIS — R110 Nausea: Secondary | ICD-10-CM

## 2020-11-05 NOTE — Telephone Encounter (Signed)
Please ask patient if she has established with another GI provider. Per our last communication, she was moving to a different part of [place] and would likely establish with another GI provider.  If we are no longer seeing patient, I cannot continue to refill Zofran.

## 2020-11-06 NOTE — Telephone Encounter (Signed)
Unfortunately, I do not know anyone in that area. Recommend she ask for a referral from primary care.

## 2020-11-06 NOTE — Telephone Encounter (Signed)
Spoke with pt. Pt moved and hasn't gotten established with anyone else yet. Pt wants to know if you have any recommendations for a Gastro doctor near [place].

## 2020-11-07 NOTE — Telephone Encounter (Signed)
Left a detailed message for pt. Pt was advised to have her PCP refer her.

## 2020-11-29 ENCOUNTER — Other Ambulatory Visit: Payer: Self-pay | Admitting: Family Medicine

## 2020-12-26 ENCOUNTER — Other Ambulatory Visit: Payer: Self-pay | Admitting: Family Medicine

## 2020-12-29 ENCOUNTER — Other Ambulatory Visit: Payer: Self-pay | Admitting: Family Medicine

## 2020-12-29 DIAGNOSIS — IMO0002 Reserved for concepts with insufficient information to code with codable children: Secondary | ICD-10-CM

## 2020-12-29 DIAGNOSIS — E1165 Type 2 diabetes mellitus with hyperglycemia: Secondary | ICD-10-CM

## 2021-01-23 ENCOUNTER — Other Ambulatory Visit: Payer: Self-pay | Admitting: Family Medicine

## 2021-01-23 DIAGNOSIS — IMO0002 Reserved for concepts with insufficient information to code with codable children: Secondary | ICD-10-CM

## 2021-01-23 DIAGNOSIS — E1165 Type 2 diabetes mellitus with hyperglycemia: Secondary | ICD-10-CM

## 2021-02-22 ENCOUNTER — Other Ambulatory Visit: Payer: Self-pay | Admitting: Family Medicine

## 2021-04-18 ENCOUNTER — Other Ambulatory Visit: Payer: Self-pay | Admitting: Family Medicine

## 2021-04-18 DIAGNOSIS — E1165 Type 2 diabetes mellitus with hyperglycemia: Secondary | ICD-10-CM

## 2021-04-18 DIAGNOSIS — IMO0002 Reserved for concepts with insufficient information to code with codable children: Secondary | ICD-10-CM

## 2021-04-21 ENCOUNTER — Other Ambulatory Visit: Payer: Self-pay | Admitting: Family Medicine

## 2021-04-21 DIAGNOSIS — E1165 Type 2 diabetes mellitus with hyperglycemia: Secondary | ICD-10-CM

## 2021-04-21 DIAGNOSIS — IMO0002 Reserved for concepts with insufficient information to code with codable children: Secondary | ICD-10-CM

## 2021-06-24 ENCOUNTER — Other Ambulatory Visit: Payer: Self-pay | Admitting: Family Medicine

## 2021-12-21 ENCOUNTER — Other Ambulatory Visit: Payer: Self-pay | Admitting: Family Medicine
